# Patient Record
Sex: Male | Born: 1935 | Race: White | Hispanic: No | Smoking: Never smoker
Health system: Southern US, Community
[De-identification: ages and names within clinical notes are randomized; demographics above are authoritative.]

## PROBLEM LIST (undated history)

## (undated) DIAGNOSIS — C801 Malignant (primary) neoplasm, unspecified: Secondary | ICD-10-CM

## (undated) DIAGNOSIS — Z862 Personal history of diseases of the blood and blood-forming organs and certain disorders involving the immune mechanism: Secondary | ICD-10-CM

## (undated) DIAGNOSIS — N189 Chronic kidney disease, unspecified: Secondary | ICD-10-CM

## (undated) DIAGNOSIS — K635 Polyp of colon: Secondary | ICD-10-CM

## (undated) DIAGNOSIS — I10 Essential (primary) hypertension: Secondary | ICD-10-CM

## (undated) DIAGNOSIS — I4891 Unspecified atrial fibrillation: Secondary | ICD-10-CM

## (undated) HISTORY — DX: Personal history of diseases of the blood and blood-forming organs and certain disorders involving the immune mechanism: Z86.2

## (undated) HISTORY — DX: Chronic kidney disease, unspecified: N18.9

## (undated) HISTORY — DX: Polyp of colon: K63.5

## (undated) HISTORY — PX: SKIN CANCER EXCISION: SHX779

## (undated) HISTORY — PX: HERNIA REPAIR: SHX51

## (undated) HISTORY — PX: EYE SURGERY: SHX253

## (undated) HISTORY — DX: Malignant (primary) neoplasm, unspecified: C80.1

---

## 2001-10-05 ENCOUNTER — Encounter: Admission: RE | Admit: 2001-10-05 | Discharge: 2001-10-05 | Payer: Self-pay | Admitting: Internal Medicine

## 2001-10-05 ENCOUNTER — Encounter: Payer: Self-pay | Admitting: Internal Medicine

## 2003-06-06 ENCOUNTER — Ambulatory Visit (HOSPITAL_COMMUNITY): Admission: RE | Admit: 2003-06-06 | Discharge: 2003-06-06 | Payer: Self-pay | Admitting: Internal Medicine

## 2003-10-18 ENCOUNTER — Inpatient Hospital Stay (HOSPITAL_COMMUNITY): Admission: AD | Admit: 2003-10-18 | Discharge: 2003-10-23 | Payer: Self-pay | Admitting: Internal Medicine

## 2003-10-18 ENCOUNTER — Encounter: Admission: RE | Admit: 2003-10-18 | Discharge: 2003-10-18 | Payer: Self-pay | Admitting: Internal Medicine

## 2005-06-28 ENCOUNTER — Observation Stay (HOSPITAL_COMMUNITY): Admission: EM | Admit: 2005-06-28 | Discharge: 2005-06-29 | Payer: Self-pay | Admitting: Emergency Medicine

## 2005-07-03 ENCOUNTER — Inpatient Hospital Stay (HOSPITAL_COMMUNITY): Admission: EM | Admit: 2005-07-03 | Discharge: 2005-07-08 | Payer: Self-pay | Admitting: Emergency Medicine

## 2011-05-23 ENCOUNTER — Encounter (INDEPENDENT_AMBULATORY_CARE_PROVIDER_SITE_OTHER): Payer: Self-pay | Admitting: Surgery

## 2011-08-23 ENCOUNTER — Encounter (INDEPENDENT_AMBULATORY_CARE_PROVIDER_SITE_OTHER): Payer: Self-pay | Admitting: Surgery

## 2011-09-10 DIAGNOSIS — N401 Enlarged prostate with lower urinary tract symptoms: Secondary | ICD-10-CM | POA: Insufficient documentation

## 2011-09-10 DIAGNOSIS — R972 Elevated prostate specific antigen [PSA]: Secondary | ICD-10-CM | POA: Insufficient documentation

## 2011-09-19 DIAGNOSIS — R310 Gross hematuria: Secondary | ICD-10-CM | POA: Insufficient documentation

## 2011-09-30 DIAGNOSIS — C61 Malignant neoplasm of prostate: Secondary | ICD-10-CM | POA: Insufficient documentation

## 2011-12-27 DIAGNOSIS — Z7901 Long term (current) use of anticoagulants: Secondary | ICD-10-CM | POA: Diagnosis not present

## 2012-01-15 DIAGNOSIS — Z7901 Long term (current) use of anticoagulants: Secondary | ICD-10-CM | POA: Diagnosis not present

## 2012-01-30 DIAGNOSIS — Z87448 Personal history of other diseases of urinary system: Secondary | ICD-10-CM | POA: Diagnosis not present

## 2012-01-30 DIAGNOSIS — C61 Malignant neoplasm of prostate: Secondary | ICD-10-CM | POA: Diagnosis not present

## 2012-01-30 DIAGNOSIS — N390 Urinary tract infection, site not specified: Secondary | ICD-10-CM | POA: Diagnosis not present

## 2012-02-06 DIAGNOSIS — Z7901 Long term (current) use of anticoagulants: Secondary | ICD-10-CM | POA: Diagnosis not present

## 2012-02-06 DIAGNOSIS — D682 Hereditary deficiency of other clotting factors: Secondary | ICD-10-CM | POA: Diagnosis not present

## 2012-02-06 DIAGNOSIS — E119 Type 2 diabetes mellitus without complications: Secondary | ICD-10-CM | POA: Diagnosis not present

## 2012-02-06 DIAGNOSIS — C61 Malignant neoplasm of prostate: Secondary | ICD-10-CM | POA: Diagnosis not present

## 2012-02-06 DIAGNOSIS — I10 Essential (primary) hypertension: Secondary | ICD-10-CM | POA: Diagnosis not present

## 2012-02-06 DIAGNOSIS — Z79899 Other long term (current) drug therapy: Secondary | ICD-10-CM | POA: Diagnosis not present

## 2012-02-12 DIAGNOSIS — M109 Gout, unspecified: Secondary | ICD-10-CM | POA: Diagnosis not present

## 2012-02-12 DIAGNOSIS — E1129 Type 2 diabetes mellitus with other diabetic kidney complication: Secondary | ICD-10-CM | POA: Diagnosis not present

## 2012-02-12 DIAGNOSIS — E1149 Type 2 diabetes mellitus with other diabetic neurological complication: Secondary | ICD-10-CM | POA: Diagnosis not present

## 2012-02-12 DIAGNOSIS — E1142 Type 2 diabetes mellitus with diabetic polyneuropathy: Secondary | ICD-10-CM | POA: Diagnosis not present

## 2012-02-12 DIAGNOSIS — Z7901 Long term (current) use of anticoagulants: Secondary | ICD-10-CM | POA: Diagnosis not present

## 2012-02-12 DIAGNOSIS — I10 Essential (primary) hypertension: Secondary | ICD-10-CM | POA: Diagnosis not present

## 2012-02-12 DIAGNOSIS — N183 Chronic kidney disease, stage 3 unspecified: Secondary | ICD-10-CM | POA: Diagnosis not present

## 2012-02-14 DIAGNOSIS — C61 Malignant neoplasm of prostate: Secondary | ICD-10-CM | POA: Diagnosis not present

## 2012-02-14 DIAGNOSIS — R972 Elevated prostate specific antigen [PSA]: Secondary | ICD-10-CM | POA: Diagnosis not present

## 2012-02-27 DIAGNOSIS — C61 Malignant neoplasm of prostate: Secondary | ICD-10-CM | POA: Diagnosis not present

## 2012-03-11 DIAGNOSIS — Z7901 Long term (current) use of anticoagulants: Secondary | ICD-10-CM | POA: Diagnosis not present

## 2012-04-08 DIAGNOSIS — Z7901 Long term (current) use of anticoagulants: Secondary | ICD-10-CM | POA: Diagnosis not present

## 2012-05-06 DIAGNOSIS — R310 Gross hematuria: Secondary | ICD-10-CM | POA: Diagnosis not present

## 2012-05-06 DIAGNOSIS — N390 Urinary tract infection, site not specified: Secondary | ICD-10-CM | POA: Diagnosis not present

## 2012-05-06 DIAGNOSIS — I129 Hypertensive chronic kidney disease with stage 1 through stage 4 chronic kidney disease, or unspecified chronic kidney disease: Secondary | ICD-10-CM | POA: Diagnosis not present

## 2012-05-06 DIAGNOSIS — N401 Enlarged prostate with lower urinary tract symptoms: Secondary | ICD-10-CM | POA: Diagnosis not present

## 2012-05-06 DIAGNOSIS — C61 Malignant neoplasm of prostate: Secondary | ICD-10-CM | POA: Diagnosis not present

## 2012-05-06 DIAGNOSIS — D682 Hereditary deficiency of other clotting factors: Secondary | ICD-10-CM | POA: Diagnosis not present

## 2012-05-06 DIAGNOSIS — R339 Retention of urine, unspecified: Secondary | ICD-10-CM | POA: Diagnosis not present

## 2012-05-06 DIAGNOSIS — N138 Other obstructive and reflux uropathy: Secondary | ICD-10-CM | POA: Diagnosis not present

## 2012-05-06 DIAGNOSIS — Z923 Personal history of irradiation: Secondary | ICD-10-CM | POA: Diagnosis not present

## 2012-05-06 DIAGNOSIS — N189 Chronic kidney disease, unspecified: Secondary | ICD-10-CM | POA: Diagnosis not present

## 2012-05-06 DIAGNOSIS — Z7901 Long term (current) use of anticoagulants: Secondary | ICD-10-CM | POA: Diagnosis not present

## 2012-05-06 DIAGNOSIS — E119 Type 2 diabetes mellitus without complications: Secondary | ICD-10-CM | POA: Diagnosis not present

## 2012-05-20 DIAGNOSIS — L820 Inflamed seborrheic keratosis: Secondary | ICD-10-CM | POA: Diagnosis not present

## 2012-05-20 DIAGNOSIS — D235 Other benign neoplasm of skin of trunk: Secondary | ICD-10-CM | POA: Diagnosis not present

## 2012-05-20 DIAGNOSIS — Z85820 Personal history of malignant melanoma of skin: Secondary | ICD-10-CM | POA: Diagnosis not present

## 2012-05-20 DIAGNOSIS — D1801 Hemangioma of skin and subcutaneous tissue: Secondary | ICD-10-CM | POA: Diagnosis not present

## 2012-06-03 DIAGNOSIS — N183 Chronic kidney disease, stage 3 unspecified: Secondary | ICD-10-CM | POA: Diagnosis not present

## 2012-06-03 DIAGNOSIS — M109 Gout, unspecified: Secondary | ICD-10-CM | POA: Diagnosis not present

## 2012-06-03 DIAGNOSIS — Z0000 Encounter for general adult medical examination without abnormal findings: Secondary | ICD-10-CM | POA: Diagnosis not present

## 2012-06-03 DIAGNOSIS — E1142 Type 2 diabetes mellitus with diabetic polyneuropathy: Secondary | ICD-10-CM | POA: Diagnosis not present

## 2012-06-03 DIAGNOSIS — Z1331 Encounter for screening for depression: Secondary | ICD-10-CM | POA: Diagnosis not present

## 2012-06-03 DIAGNOSIS — E785 Hyperlipidemia, unspecified: Secondary | ICD-10-CM | POA: Diagnosis not present

## 2012-06-03 DIAGNOSIS — E1149 Type 2 diabetes mellitus with other diabetic neurological complication: Secondary | ICD-10-CM | POA: Diagnosis not present

## 2012-06-03 DIAGNOSIS — E1129 Type 2 diabetes mellitus with other diabetic kidney complication: Secondary | ICD-10-CM | POA: Diagnosis not present

## 2012-07-10 DIAGNOSIS — Z7901 Long term (current) use of anticoagulants: Secondary | ICD-10-CM | POA: Diagnosis not present

## 2012-07-22 DIAGNOSIS — Z7901 Long term (current) use of anticoagulants: Secondary | ICD-10-CM | POA: Diagnosis not present

## 2012-08-25 DIAGNOSIS — Z7901 Long term (current) use of anticoagulants: Secondary | ICD-10-CM | POA: Diagnosis not present

## 2012-08-25 DIAGNOSIS — N183 Chronic kidney disease, stage 3 unspecified: Secondary | ICD-10-CM | POA: Diagnosis not present

## 2012-08-25 DIAGNOSIS — E1142 Type 2 diabetes mellitus with diabetic polyneuropathy: Secondary | ICD-10-CM | POA: Diagnosis not present

## 2012-08-25 DIAGNOSIS — E1129 Type 2 diabetes mellitus with other diabetic kidney complication: Secondary | ICD-10-CM | POA: Diagnosis not present

## 2012-08-25 DIAGNOSIS — E1149 Type 2 diabetes mellitus with other diabetic neurological complication: Secondary | ICD-10-CM | POA: Diagnosis not present

## 2012-08-25 DIAGNOSIS — N400 Enlarged prostate without lower urinary tract symptoms: Secondary | ICD-10-CM | POA: Diagnosis not present

## 2012-08-25 DIAGNOSIS — M109 Gout, unspecified: Secondary | ICD-10-CM | POA: Diagnosis not present

## 2012-08-25 DIAGNOSIS — I10 Essential (primary) hypertension: Secondary | ICD-10-CM | POA: Diagnosis not present

## 2012-09-15 DIAGNOSIS — Z23 Encounter for immunization: Secondary | ICD-10-CM | POA: Diagnosis not present

## 2012-09-22 DIAGNOSIS — Z7901 Long term (current) use of anticoagulants: Secondary | ICD-10-CM | POA: Diagnosis not present

## 2012-10-05 DIAGNOSIS — H43819 Vitreous degeneration, unspecified eye: Secondary | ICD-10-CM | POA: Diagnosis not present

## 2012-10-05 DIAGNOSIS — E119 Type 2 diabetes mellitus without complications: Secondary | ICD-10-CM | POA: Diagnosis not present

## 2012-10-05 DIAGNOSIS — H35379 Puckering of macula, unspecified eye: Secondary | ICD-10-CM | POA: Diagnosis not present

## 2012-10-20 DIAGNOSIS — Z7901 Long term (current) use of anticoagulants: Secondary | ICD-10-CM | POA: Diagnosis not present

## 2012-11-04 DIAGNOSIS — N320 Bladder-neck obstruction: Secondary | ICD-10-CM | POA: Diagnosis not present

## 2012-11-04 DIAGNOSIS — N312 Flaccid neuropathic bladder, not elsewhere classified: Secondary | ICD-10-CM | POA: Insufficient documentation

## 2012-11-04 DIAGNOSIS — N390 Urinary tract infection, site not specified: Secondary | ICD-10-CM | POA: Diagnosis not present

## 2012-11-04 DIAGNOSIS — C61 Malignant neoplasm of prostate: Secondary | ICD-10-CM | POA: Diagnosis not present

## 2012-11-04 DIAGNOSIS — R339 Retention of urine, unspecified: Secondary | ICD-10-CM | POA: Diagnosis not present

## 2012-11-04 DIAGNOSIS — R972 Elevated prostate specific antigen [PSA]: Secondary | ICD-10-CM | POA: Diagnosis not present

## 2012-11-17 DIAGNOSIS — Z7901 Long term (current) use of anticoagulants: Secondary | ICD-10-CM | POA: Diagnosis not present

## 2012-11-18 DIAGNOSIS — Z85820 Personal history of malignant melanoma of skin: Secondary | ICD-10-CM | POA: Diagnosis not present

## 2012-11-18 DIAGNOSIS — L570 Actinic keratosis: Secondary | ICD-10-CM | POA: Diagnosis not present

## 2012-11-18 DIAGNOSIS — D235 Other benign neoplasm of skin of trunk: Secondary | ICD-10-CM | POA: Diagnosis not present

## 2012-11-18 DIAGNOSIS — D1801 Hemangioma of skin and subcutaneous tissue: Secondary | ICD-10-CM | POA: Diagnosis not present

## 2012-12-17 DIAGNOSIS — Z7901 Long term (current) use of anticoagulants: Secondary | ICD-10-CM | POA: Diagnosis not present

## 2012-12-29 DIAGNOSIS — N400 Enlarged prostate without lower urinary tract symptoms: Secondary | ICD-10-CM | POA: Diagnosis not present

## 2012-12-29 DIAGNOSIS — E1149 Type 2 diabetes mellitus with other diabetic neurological complication: Secondary | ICD-10-CM | POA: Diagnosis not present

## 2012-12-29 DIAGNOSIS — M109 Gout, unspecified: Secondary | ICD-10-CM | POA: Diagnosis not present

## 2012-12-29 DIAGNOSIS — E1142 Type 2 diabetes mellitus with diabetic polyneuropathy: Secondary | ICD-10-CM | POA: Diagnosis not present

## 2012-12-29 DIAGNOSIS — E1129 Type 2 diabetes mellitus with other diabetic kidney complication: Secondary | ICD-10-CM | POA: Diagnosis not present

## 2012-12-29 DIAGNOSIS — N183 Chronic kidney disease, stage 3 unspecified: Secondary | ICD-10-CM | POA: Diagnosis not present

## 2012-12-29 DIAGNOSIS — I10 Essential (primary) hypertension: Secondary | ICD-10-CM | POA: Diagnosis not present

## 2013-01-20 DIAGNOSIS — Z7901 Long term (current) use of anticoagulants: Secondary | ICD-10-CM | POA: Diagnosis not present

## 2013-02-16 DIAGNOSIS — Z7901 Long term (current) use of anticoagulants: Secondary | ICD-10-CM | POA: Diagnosis not present

## 2013-03-16 DIAGNOSIS — Z7901 Long term (current) use of anticoagulants: Secondary | ICD-10-CM | POA: Diagnosis not present

## 2013-04-13 DIAGNOSIS — Z7901 Long term (current) use of anticoagulants: Secondary | ICD-10-CM | POA: Diagnosis not present

## 2013-04-28 DIAGNOSIS — E1149 Type 2 diabetes mellitus with other diabetic neurological complication: Secondary | ICD-10-CM | POA: Diagnosis not present

## 2013-04-28 DIAGNOSIS — M109 Gout, unspecified: Secondary | ICD-10-CM | POA: Diagnosis not present

## 2013-04-28 DIAGNOSIS — N400 Enlarged prostate without lower urinary tract symptoms: Secondary | ICD-10-CM | POA: Diagnosis not present

## 2013-04-28 DIAGNOSIS — I10 Essential (primary) hypertension: Secondary | ICD-10-CM | POA: Diagnosis not present

## 2013-04-28 DIAGNOSIS — E1142 Type 2 diabetes mellitus with diabetic polyneuropathy: Secondary | ICD-10-CM | POA: Diagnosis not present

## 2013-04-28 DIAGNOSIS — N183 Chronic kidney disease, stage 3 unspecified: Secondary | ICD-10-CM | POA: Diagnosis not present

## 2013-04-28 DIAGNOSIS — E1129 Type 2 diabetes mellitus with other diabetic kidney complication: Secondary | ICD-10-CM | POA: Diagnosis not present

## 2013-04-28 DIAGNOSIS — E785 Hyperlipidemia, unspecified: Secondary | ICD-10-CM | POA: Diagnosis not present

## 2013-04-30 DIAGNOSIS — N183 Chronic kidney disease, stage 3 unspecified: Secondary | ICD-10-CM | POA: Diagnosis not present

## 2013-05-11 DIAGNOSIS — Z7901 Long term (current) use of anticoagulants: Secondary | ICD-10-CM | POA: Diagnosis not present

## 2013-05-12 DIAGNOSIS — C61 Malignant neoplasm of prostate: Secondary | ICD-10-CM | POA: Diagnosis not present

## 2013-05-12 DIAGNOSIS — N390 Urinary tract infection, site not specified: Secondary | ICD-10-CM | POA: Diagnosis not present

## 2013-05-12 DIAGNOSIS — N312 Flaccid neuropathic bladder, not elsewhere classified: Secondary | ICD-10-CM | POA: Diagnosis not present

## 2013-05-12 DIAGNOSIS — R339 Retention of urine, unspecified: Secondary | ICD-10-CM | POA: Diagnosis not present

## 2013-05-12 DIAGNOSIS — R972 Elevated prostate specific antigen [PSA]: Secondary | ICD-10-CM | POA: Diagnosis not present

## 2013-05-19 DIAGNOSIS — Z85820 Personal history of malignant melanoma of skin: Secondary | ICD-10-CM | POA: Diagnosis not present

## 2013-05-19 DIAGNOSIS — L723 Sebaceous cyst: Secondary | ICD-10-CM | POA: Diagnosis not present

## 2013-05-19 DIAGNOSIS — D235 Other benign neoplasm of skin of trunk: Secondary | ICD-10-CM | POA: Diagnosis not present

## 2013-06-08 DIAGNOSIS — Z7901 Long term (current) use of anticoagulants: Secondary | ICD-10-CM | POA: Diagnosis not present

## 2013-06-14 DIAGNOSIS — L97509 Non-pressure chronic ulcer of other part of unspecified foot with unspecified severity: Secondary | ICD-10-CM | POA: Diagnosis not present

## 2013-06-14 DIAGNOSIS — B351 Tinea unguium: Secondary | ICD-10-CM | POA: Diagnosis not present

## 2013-06-14 DIAGNOSIS — M2040 Other hammer toe(s) (acquired), unspecified foot: Secondary | ICD-10-CM | POA: Diagnosis not present

## 2013-06-14 DIAGNOSIS — M79609 Pain in unspecified limb: Secondary | ICD-10-CM | POA: Diagnosis not present

## 2013-06-28 DIAGNOSIS — L97509 Non-pressure chronic ulcer of other part of unspecified foot with unspecified severity: Secondary | ICD-10-CM | POA: Diagnosis not present

## 2013-07-06 DIAGNOSIS — Z7901 Long term (current) use of anticoagulants: Secondary | ICD-10-CM | POA: Diagnosis not present

## 2013-08-04 DIAGNOSIS — Z7901 Long term (current) use of anticoagulants: Secondary | ICD-10-CM | POA: Diagnosis not present

## 2013-08-31 DIAGNOSIS — E1129 Type 2 diabetes mellitus with other diabetic kidney complication: Secondary | ICD-10-CM | POA: Diagnosis not present

## 2013-08-31 DIAGNOSIS — Z7901 Long term (current) use of anticoagulants: Secondary | ICD-10-CM | POA: Diagnosis not present

## 2013-08-31 DIAGNOSIS — I10 Essential (primary) hypertension: Secondary | ICD-10-CM | POA: Diagnosis not present

## 2013-08-31 DIAGNOSIS — N183 Chronic kidney disease, stage 3 unspecified: Secondary | ICD-10-CM | POA: Diagnosis not present

## 2013-08-31 DIAGNOSIS — E1142 Type 2 diabetes mellitus with diabetic polyneuropathy: Secondary | ICD-10-CM | POA: Diagnosis not present

## 2013-08-31 DIAGNOSIS — E1149 Type 2 diabetes mellitus with other diabetic neurological complication: Secondary | ICD-10-CM | POA: Diagnosis not present

## 2013-08-31 DIAGNOSIS — M109 Gout, unspecified: Secondary | ICD-10-CM | POA: Diagnosis not present

## 2013-09-24 ENCOUNTER — Encounter: Payer: Self-pay | Admitting: *Deleted

## 2013-09-28 DIAGNOSIS — Z7901 Long term (current) use of anticoagulants: Secondary | ICD-10-CM | POA: Diagnosis not present

## 2013-09-29 ENCOUNTER — Ambulatory Visit: Payer: Self-pay | Admitting: Podiatry

## 2013-10-11 DIAGNOSIS — Z23 Encounter for immunization: Secondary | ICD-10-CM | POA: Diagnosis not present

## 2013-10-26 DIAGNOSIS — I2699 Other pulmonary embolism without acute cor pulmonale: Secondary | ICD-10-CM | POA: Diagnosis not present

## 2013-10-26 DIAGNOSIS — Z7901 Long term (current) use of anticoagulants: Secondary | ICD-10-CM | POA: Diagnosis not present

## 2013-11-17 DIAGNOSIS — Z85820 Personal history of malignant melanoma of skin: Secondary | ICD-10-CM | POA: Diagnosis not present

## 2013-11-17 DIAGNOSIS — L820 Inflamed seborrheic keratosis: Secondary | ICD-10-CM | POA: Diagnosis not present

## 2013-11-17 DIAGNOSIS — L570 Actinic keratosis: Secondary | ICD-10-CM | POA: Diagnosis not present

## 2013-11-17 DIAGNOSIS — L821 Other seborrheic keratosis: Secondary | ICD-10-CM | POA: Diagnosis not present

## 2013-11-23 DIAGNOSIS — Z7901 Long term (current) use of anticoagulants: Secondary | ICD-10-CM | POA: Diagnosis not present

## 2013-11-23 DIAGNOSIS — I872 Venous insufficiency (chronic) (peripheral): Secondary | ICD-10-CM | POA: Diagnosis not present

## 2013-12-22 DIAGNOSIS — Z5181 Encounter for therapeutic drug level monitoring: Secondary | ICD-10-CM | POA: Diagnosis not present

## 2013-12-22 DIAGNOSIS — Z7901 Long term (current) use of anticoagulants: Secondary | ICD-10-CM | POA: Diagnosis not present

## 2014-01-04 DIAGNOSIS — E1129 Type 2 diabetes mellitus with other diabetic kidney complication: Secondary | ICD-10-CM | POA: Diagnosis not present

## 2014-01-04 DIAGNOSIS — E1149 Type 2 diabetes mellitus with other diabetic neurological complication: Secondary | ICD-10-CM | POA: Diagnosis not present

## 2014-01-04 DIAGNOSIS — E1142 Type 2 diabetes mellitus with diabetic polyneuropathy: Secondary | ICD-10-CM | POA: Diagnosis not present

## 2014-01-04 DIAGNOSIS — I10 Essential (primary) hypertension: Secondary | ICD-10-CM | POA: Diagnosis not present

## 2014-01-04 DIAGNOSIS — N183 Chronic kidney disease, stage 3 unspecified: Secondary | ICD-10-CM | POA: Diagnosis not present

## 2014-01-05 DIAGNOSIS — C61 Malignant neoplasm of prostate: Secondary | ICD-10-CM | POA: Diagnosis not present

## 2014-01-05 DIAGNOSIS — R972 Elevated prostate specific antigen [PSA]: Secondary | ICD-10-CM | POA: Diagnosis not present

## 2014-01-05 DIAGNOSIS — R339 Retention of urine, unspecified: Secondary | ICD-10-CM | POA: Diagnosis not present

## 2014-01-05 DIAGNOSIS — N390 Urinary tract infection, site not specified: Secondary | ICD-10-CM | POA: Diagnosis not present

## 2014-01-19 DIAGNOSIS — Z5181 Encounter for therapeutic drug level monitoring: Secondary | ICD-10-CM | POA: Diagnosis not present

## 2014-01-19 DIAGNOSIS — Z7901 Long term (current) use of anticoagulants: Secondary | ICD-10-CM | POA: Diagnosis not present

## 2014-02-16 DIAGNOSIS — I872 Venous insufficiency (chronic) (peripheral): Secondary | ICD-10-CM | POA: Diagnosis not present

## 2014-02-16 DIAGNOSIS — Z7901 Long term (current) use of anticoagulants: Secondary | ICD-10-CM | POA: Diagnosis not present

## 2014-03-15 DIAGNOSIS — Z7901 Long term (current) use of anticoagulants: Secondary | ICD-10-CM | POA: Diagnosis not present

## 2014-03-15 DIAGNOSIS — I2699 Other pulmonary embolism without acute cor pulmonale: Secondary | ICD-10-CM | POA: Diagnosis not present

## 2014-03-23 DIAGNOSIS — B356 Tinea cruris: Secondary | ICD-10-CM | POA: Diagnosis not present

## 2014-03-23 DIAGNOSIS — J069 Acute upper respiratory infection, unspecified: Secondary | ICD-10-CM | POA: Diagnosis not present

## 2014-03-23 DIAGNOSIS — B372 Candidiasis of skin and nail: Secondary | ICD-10-CM | POA: Diagnosis not present

## 2014-03-23 DIAGNOSIS — L089 Local infection of the skin and subcutaneous tissue, unspecified: Secondary | ICD-10-CM | POA: Diagnosis not present

## 2014-04-13 DIAGNOSIS — Z7901 Long term (current) use of anticoagulants: Secondary | ICD-10-CM | POA: Diagnosis not present

## 2014-04-13 DIAGNOSIS — Z5181 Encounter for therapeutic drug level monitoring: Secondary | ICD-10-CM | POA: Diagnosis not present

## 2014-05-11 DIAGNOSIS — Z7901 Long term (current) use of anticoagulants: Secondary | ICD-10-CM | POA: Diagnosis not present

## 2014-05-11 DIAGNOSIS — I2699 Other pulmonary embolism without acute cor pulmonale: Secondary | ICD-10-CM | POA: Diagnosis not present

## 2014-05-18 DIAGNOSIS — D1801 Hemangioma of skin and subcutaneous tissue: Secondary | ICD-10-CM | POA: Diagnosis not present

## 2014-05-18 DIAGNOSIS — Z85820 Personal history of malignant melanoma of skin: Secondary | ICD-10-CM | POA: Diagnosis not present

## 2014-05-18 DIAGNOSIS — D485 Neoplasm of uncertain behavior of skin: Secondary | ICD-10-CM | POA: Diagnosis not present

## 2014-05-18 DIAGNOSIS — D235 Other benign neoplasm of skin of trunk: Secondary | ICD-10-CM | POA: Diagnosis not present

## 2014-05-24 DIAGNOSIS — Z1331 Encounter for screening for depression: Secondary | ICD-10-CM | POA: Diagnosis not present

## 2014-05-24 DIAGNOSIS — Z0000 Encounter for general adult medical examination without abnormal findings: Secondary | ICD-10-CM | POA: Diagnosis not present

## 2014-05-24 DIAGNOSIS — N183 Chronic kidney disease, stage 3 unspecified: Secondary | ICD-10-CM | POA: Diagnosis not present

## 2014-05-24 DIAGNOSIS — E1142 Type 2 diabetes mellitus with diabetic polyneuropathy: Secondary | ICD-10-CM | POA: Diagnosis not present

## 2014-05-24 DIAGNOSIS — I739 Peripheral vascular disease, unspecified: Secondary | ICD-10-CM | POA: Diagnosis not present

## 2014-05-24 DIAGNOSIS — E1129 Type 2 diabetes mellitus with other diabetic kidney complication: Secondary | ICD-10-CM | POA: Diagnosis not present

## 2014-05-24 DIAGNOSIS — Z23 Encounter for immunization: Secondary | ICD-10-CM | POA: Diagnosis not present

## 2014-05-24 DIAGNOSIS — E1149 Type 2 diabetes mellitus with other diabetic neurological complication: Secondary | ICD-10-CM | POA: Diagnosis not present

## 2014-05-24 DIAGNOSIS — E1159 Type 2 diabetes mellitus with other circulatory complications: Secondary | ICD-10-CM | POA: Diagnosis not present

## 2014-05-24 DIAGNOSIS — I10 Essential (primary) hypertension: Secondary | ICD-10-CM | POA: Diagnosis not present

## 2014-06-08 DIAGNOSIS — Z7901 Long term (current) use of anticoagulants: Secondary | ICD-10-CM | POA: Diagnosis not present

## 2014-06-08 DIAGNOSIS — I2699 Other pulmonary embolism without acute cor pulmonale: Secondary | ICD-10-CM | POA: Diagnosis not present

## 2014-07-06 DIAGNOSIS — N138 Other obstructive and reflux uropathy: Secondary | ICD-10-CM | POA: Diagnosis not present

## 2014-07-06 DIAGNOSIS — Z87891 Personal history of nicotine dependence: Secondary | ICD-10-CM | POA: Diagnosis not present

## 2014-07-06 DIAGNOSIS — N189 Chronic kidney disease, unspecified: Secondary | ICD-10-CM | POA: Diagnosis not present

## 2014-07-06 DIAGNOSIS — I129 Hypertensive chronic kidney disease with stage 1 through stage 4 chronic kidney disease, or unspecified chronic kidney disease: Secondary | ICD-10-CM | POA: Diagnosis not present

## 2014-07-06 DIAGNOSIS — N401 Enlarged prostate with lower urinary tract symptoms: Secondary | ICD-10-CM | POA: Diagnosis not present

## 2014-07-06 DIAGNOSIS — C61 Malignant neoplasm of prostate: Secondary | ICD-10-CM | POA: Diagnosis not present

## 2014-07-06 DIAGNOSIS — R972 Elevated prostate specific antigen [PSA]: Secondary | ICD-10-CM | POA: Diagnosis not present

## 2014-07-06 DIAGNOSIS — Z5181 Encounter for therapeutic drug level monitoring: Secondary | ICD-10-CM | POA: Diagnosis not present

## 2014-07-06 DIAGNOSIS — R310 Gross hematuria: Secondary | ICD-10-CM | POA: Diagnosis not present

## 2014-07-06 DIAGNOSIS — R339 Retention of urine, unspecified: Secondary | ICD-10-CM | POA: Diagnosis not present

## 2014-07-06 DIAGNOSIS — N312 Flaccid neuropathic bladder, not elsewhere classified: Secondary | ICD-10-CM | POA: Diagnosis not present

## 2014-07-06 DIAGNOSIS — Z7901 Long term (current) use of anticoagulants: Secondary | ICD-10-CM | POA: Diagnosis not present

## 2014-07-21 DIAGNOSIS — C61 Malignant neoplasm of prostate: Secondary | ICD-10-CM | POA: Diagnosis not present

## 2014-07-21 DIAGNOSIS — R972 Elevated prostate specific antigen [PSA]: Secondary | ICD-10-CM | POA: Diagnosis not present

## 2014-08-03 DIAGNOSIS — I872 Venous insufficiency (chronic) (peripheral): Secondary | ICD-10-CM | POA: Diagnosis not present

## 2014-08-03 DIAGNOSIS — Z7901 Long term (current) use of anticoagulants: Secondary | ICD-10-CM | POA: Diagnosis not present

## 2014-08-31 DIAGNOSIS — Z7901 Long term (current) use of anticoagulants: Secondary | ICD-10-CM | POA: Diagnosis not present

## 2014-08-31 DIAGNOSIS — I872 Venous insufficiency (chronic) (peripheral): Secondary | ICD-10-CM | POA: Diagnosis not present

## 2014-09-28 DIAGNOSIS — N183 Chronic kidney disease, stage 3 (moderate): Secondary | ICD-10-CM | POA: Diagnosis not present

## 2014-09-28 DIAGNOSIS — E1142 Type 2 diabetes mellitus with diabetic polyneuropathy: Secondary | ICD-10-CM | POA: Diagnosis not present

## 2014-09-28 DIAGNOSIS — Z23 Encounter for immunization: Secondary | ICD-10-CM | POA: Diagnosis not present

## 2014-09-28 DIAGNOSIS — N400 Enlarged prostate without lower urinary tract symptoms: Secondary | ICD-10-CM | POA: Diagnosis not present

## 2014-09-28 DIAGNOSIS — E1122 Type 2 diabetes mellitus with diabetic chronic kidney disease: Secondary | ICD-10-CM | POA: Diagnosis not present

## 2014-09-28 DIAGNOSIS — M109 Gout, unspecified: Secondary | ICD-10-CM | POA: Diagnosis not present

## 2014-09-28 DIAGNOSIS — I739 Peripheral vascular disease, unspecified: Secondary | ICD-10-CM | POA: Diagnosis not present

## 2014-09-28 DIAGNOSIS — I129 Hypertensive chronic kidney disease with stage 1 through stage 4 chronic kidney disease, or unspecified chronic kidney disease: Secondary | ICD-10-CM | POA: Diagnosis not present

## 2014-09-28 DIAGNOSIS — E1151 Type 2 diabetes mellitus with diabetic peripheral angiopathy without gangrene: Secondary | ICD-10-CM | POA: Diagnosis not present

## 2014-09-28 DIAGNOSIS — Z7901 Long term (current) use of anticoagulants: Secondary | ICD-10-CM | POA: Diagnosis not present

## 2014-10-13 ENCOUNTER — Encounter: Payer: Self-pay | Admitting: Podiatry

## 2014-10-13 ENCOUNTER — Ambulatory Visit (INDEPENDENT_AMBULATORY_CARE_PROVIDER_SITE_OTHER): Payer: Medicare Other | Admitting: Podiatry

## 2014-10-13 VITALS — BP 150/86 | HR 66 | Resp 17

## 2014-10-13 DIAGNOSIS — M79676 Pain in unspecified toe(s): Secondary | ICD-10-CM | POA: Diagnosis not present

## 2014-10-13 DIAGNOSIS — E1149 Type 2 diabetes mellitus with other diabetic neurological complication: Secondary | ICD-10-CM

## 2014-10-13 DIAGNOSIS — E1140 Type 2 diabetes mellitus with diabetic neuropathy, unspecified: Secondary | ICD-10-CM | POA: Diagnosis not present

## 2014-10-13 DIAGNOSIS — B351 Tinea unguium: Secondary | ICD-10-CM | POA: Diagnosis not present

## 2014-10-13 NOTE — Progress Notes (Signed)
   Subjective:    Patient ID: Richard [name], male    DOB: [birth year], 80 y.o.   MRN: [id number]  HPI Mr. Richard [name], 80 year old male, presents the office if her diabetic risk assessment and for painful elongated nails. He is also asking to see if he is a candidate for diabetic shoes. He is a history of ulcerations of the right third digit. He denies any claudication symptoms. No other complaints at this time.  Review of Systems  All other systems reviewed and are negative.      Objective:   Physical Exam AAO x3, NAD DP/PT pulses palpable bilaterally, CRT less than 3 seconds Protective sensation decreased with Simms Weinstein monofilament, vibratory sensation decreased , Achilles tendon reflex intact Nails hypertrophic, dystrophic, elongated, brittle, discolored 10 without any surrounding erythema or drainage. No open lesions MMT 5/5, ROM WNL. Hammertoe contractures bilaterally. Decreased medial arch height bilaterally. Mild equinus bilaterally. No calf pain with compression, swelling, warmth, erythema.        Assessment & Plan:  80 year old male with symptomatic onychomycosis -Treatment options discussed including alternatives, risks, complications. -Nail sharply debrided 10 without complications. -Discussed the importance of daily foot inspection. -Recommend diabetic shoes due to the history of ulceration with deformity and decreased sensation. The patient was given a prescription to [facility] to have diabetic inserts/shoes made. -Follow-up in 3 months or sooner if any problems are to arise. In the meantime call the office with a questions, concerns, change in symptoms.

## 2014-10-13 NOTE — Patient Instructions (Signed)
Diabetes and Foot Care Diabetes may cause you to have problems because of poor blood supply (circulation) to your feet and legs. This may cause the skin on your feet to become thinner, break easier, and heal more slowly. Your skin may become dry, and the skin may peel and crack. You may also have nerve damage in your legs and feet causing decreased feeling in them. You may not notice minor injuries to your feet that could lead to infections or more serious problems. Taking care of your feet is one of the most important things you can do for yourself.  HOME CARE INSTRUCTIONS  Wear shoes at all times, even in the house. Do not go barefoot. Bare feet are easily injured.  Check your feet daily for blisters, cuts, and redness. If you cannot see the bottom of your feet, use a mirror or ask someone for help.  Wash your feet with warm water (do not use hot water) and mild soap. Then pat your feet and the areas between your toes until they are completely dry. Do not soak your feet as this can dry your skin.  Apply a moisturizing lotion or petroleum jelly (that does not contain alcohol and is unscented) to the skin on your feet and to dry, brittle toenails. Do not apply lotion between your toes.  Trim your toenails straight across. Do not dig under them or around the cuticle. File the edges of your nails with an emery board or nail file.  Do not cut corns or calluses or try to remove them with medicine.  Wear clean socks or stockings every day. Make sure they are not too tight. Do not wear knee-high stockings since they may decrease blood flow to your legs.  Wear shoes that fit properly and have enough cushioning. To break in new shoes, wear them for just a few hours a day. This prevents you from injuring your feet. Always look in your shoes before you put them on to be sure there are no objects inside.  Do not cross your legs. This may decrease the blood flow to your feet.  If you find a minor scrape,  cut, or break in the skin on your feet, keep it and the skin around it clean and dry. These areas may be cleansed with mild soap and water. Do not cleanse the area with peroxide, alcohol, or iodine.  When you remove an adhesive bandage, be sure not to damage the skin around it.  If you have a wound, look at it several times a day to make sure it is healing.  Do not use heating pads or hot water bottles. They may burn your skin. If you have lost feeling in your feet or legs, you may not know it is happening until it is too late.  Make sure your health care provider performs a complete foot exam at least annually or more often if you have foot problems. Report any cuts, sores, or bruises to your health care provider immediately. SEEK MEDICAL CARE IF:   You have an injury that is not healing.  You have cuts or breaks in the skin.  You have an ingrown nail.  You notice redness on your legs or feet.  You feel burning or tingling in your legs or feet.  You have pain or cramps in your legs and feet.  Your legs or feet are numb.  Your feet always feel cold. SEEK IMMEDIATE MEDICAL CARE IF:   There is increasing redness,   swelling, or pain in or around a wound.  There is a red line that goes up your leg.  Pus is coming from a wound.  You develop a fever or as directed by your health care provider.  You notice a bad smell coming from an ulcer or wound. Document Released: 11/29/2000 Document Revised: 08/04/2013 Document Reviewed: 05/11/2013 Exit[employer] Patient Information 2015 [employer]. This information is not intended to replace advice given to you by your health care provider. Make sure you discuss any questions you have with your health care provider.  

## 2014-10-19 ENCOUNTER — Ambulatory Visit: Payer: Self-pay | Admitting: Podiatry

## 2014-10-26 DIAGNOSIS — Z7901 Long term (current) use of anticoagulants: Secondary | ICD-10-CM | POA: Diagnosis not present

## 2014-10-26 DIAGNOSIS — Z86711 Personal history of pulmonary embolism: Secondary | ICD-10-CM | POA: Diagnosis not present

## 2014-11-16 DIAGNOSIS — Z08 Encounter for follow-up examination after completed treatment for malignant neoplasm: Secondary | ICD-10-CM | POA: Diagnosis not present

## 2014-11-16 DIAGNOSIS — Z85820 Personal history of malignant melanoma of skin: Secondary | ICD-10-CM | POA: Diagnosis not present

## 2014-11-16 DIAGNOSIS — D1801 Hemangioma of skin and subcutaneous tissue: Secondary | ICD-10-CM | POA: Diagnosis not present

## 2014-11-16 DIAGNOSIS — D225 Melanocytic nevi of trunk: Secondary | ICD-10-CM | POA: Diagnosis not present

## 2014-11-23 DIAGNOSIS — Z7901 Long term (current) use of anticoagulants: Secondary | ICD-10-CM | POA: Diagnosis not present

## 2014-12-21 DIAGNOSIS — Z7901 Long term (current) use of anticoagulants: Secondary | ICD-10-CM | POA: Diagnosis not present

## 2015-01-10 ENCOUNTER — Ambulatory Visit (INDEPENDENT_AMBULATORY_CARE_PROVIDER_SITE_OTHER): Payer: Medicare Other | Admitting: Podiatry

## 2015-01-10 ENCOUNTER — Encounter: Payer: Self-pay | Admitting: Podiatry

## 2015-01-10 DIAGNOSIS — B351 Tinea unguium: Secondary | ICD-10-CM

## 2015-01-10 DIAGNOSIS — G629 Polyneuropathy, unspecified: Secondary | ICD-10-CM

## 2015-01-10 DIAGNOSIS — M79676 Pain in unspecified toe(s): Secondary | ICD-10-CM

## 2015-01-10 NOTE — Progress Notes (Signed)
Patient ID: TYHIR RODDA, male   DOB: 19-Dec-1930, 81 y.o.   MRN: [id number]  Subjective: 81 year old male returns the office they for follow-up evaluation and for continued footcare. He states that his nails elongated and thick. He states that he is unable to trim the nails himself to the thickness and he is also on Coumadin. He states the nails rub in the shoes causes him discomfort. No other complaints at this time in no acute changes and facet pointed. Denies any systemic complaints as fevers, chills, nausea, vomiting.  Objective: AAO 3, NAD DP/PT pulses palpable bilaterally, CRT less than 3 seconds. Varicose veins to bilateral lower extremities. There is trace chronic appearing edema to bilateral lower extremities.  Decreased protective sensation with Derrel Nip monofilament, decreased vibratory sensation, Achilles tendon reflex intact. Nails hypertrophic, dystrophic, elongated, brittle, discolored 10. No surrounding erythema or drainage from the nail sites. No open lesions or pre-ulcerative lesions identified.  No other areas of tenderness to bilateral lower extremities. No overlying erythema or increase in warmth to bilateral lower extremity. No pain with calf compression, swelling, warmth, erythema.  Assessment: 81 year old male with symptomatic onychomycosis; neuropathy   Plan: -Treatment options discussed with the patient include alternatives, risks, competitions.  -Nail sharply debrided 10 without complication/bleeding.  -Discussed the importance of inspection.  -Follow-up in 3 months or sooner should any problems arise. In the meantime, encouraged to call the office with any questions, concerns, change in symptoms.

## 2015-01-11 DIAGNOSIS — C61 Malignant neoplasm of prostate: Secondary | ICD-10-CM | POA: Diagnosis not present

## 2015-01-11 DIAGNOSIS — R319 Hematuria, unspecified: Secondary | ICD-10-CM | POA: Diagnosis not present

## 2015-01-11 DIAGNOSIS — N390 Urinary tract infection, site not specified: Secondary | ICD-10-CM | POA: Insufficient documentation

## 2015-01-11 DIAGNOSIS — R339 Retention of urine, unspecified: Secondary | ICD-10-CM | POA: Diagnosis not present

## 2015-01-11 DIAGNOSIS — N312 Flaccid neuropathic bladder, not elsewhere classified: Secondary | ICD-10-CM | POA: Diagnosis not present

## 2015-01-11 DIAGNOSIS — R972 Elevated prostate specific antigen [PSA]: Secondary | ICD-10-CM | POA: Diagnosis not present

## 2015-01-12 ENCOUNTER — Ambulatory Visit: Payer: Medicare Other | Admitting: Podiatry

## 2015-01-18 DIAGNOSIS — Z7901 Long term (current) use of anticoagulants: Secondary | ICD-10-CM | POA: Diagnosis not present

## 2015-01-31 DIAGNOSIS — E119 Type 2 diabetes mellitus without complications: Secondary | ICD-10-CM | POA: Diagnosis not present

## 2015-01-31 DIAGNOSIS — H04123 Dry eye syndrome of bilateral lacrimal glands: Secondary | ICD-10-CM | POA: Diagnosis not present

## 2015-02-01 DIAGNOSIS — N400 Enlarged prostate without lower urinary tract symptoms: Secondary | ICD-10-CM | POA: Diagnosis not present

## 2015-02-01 DIAGNOSIS — E1122 Type 2 diabetes mellitus with diabetic chronic kidney disease: Secondary | ICD-10-CM | POA: Diagnosis not present

## 2015-02-01 DIAGNOSIS — E1142 Type 2 diabetes mellitus with diabetic polyneuropathy: Secondary | ICD-10-CM | POA: Diagnosis not present

## 2015-02-01 DIAGNOSIS — N183 Chronic kidney disease, stage 3 (moderate): Secondary | ICD-10-CM | POA: Diagnosis not present

## 2015-02-01 DIAGNOSIS — N529 Male erectile dysfunction, unspecified: Secondary | ICD-10-CM | POA: Diagnosis not present

## 2015-02-01 DIAGNOSIS — I129 Hypertensive chronic kidney disease with stage 1 through stage 4 chronic kidney disease, or unspecified chronic kidney disease: Secondary | ICD-10-CM | POA: Diagnosis not present

## 2015-02-01 DIAGNOSIS — E1151 Type 2 diabetes mellitus with diabetic peripheral angiopathy without gangrene: Secondary | ICD-10-CM | POA: Diagnosis not present

## 2015-02-15 DIAGNOSIS — Z7901 Long term (current) use of anticoagulants: Secondary | ICD-10-CM | POA: Diagnosis not present

## 2015-03-15 DIAGNOSIS — Z7901 Long term (current) use of anticoagulants: Secondary | ICD-10-CM | POA: Diagnosis not present

## 2015-04-12 DIAGNOSIS — Z7901 Long term (current) use of anticoagulants: Secondary | ICD-10-CM | POA: Diagnosis not present

## 2015-04-13 ENCOUNTER — Ambulatory Visit (INDEPENDENT_AMBULATORY_CARE_PROVIDER_SITE_OTHER): Payer: Medicare Other | Admitting: Podiatry

## 2015-04-13 DIAGNOSIS — B351 Tinea unguium: Secondary | ICD-10-CM | POA: Diagnosis not present

## 2015-04-13 DIAGNOSIS — M79676 Pain in unspecified toe(s): Secondary | ICD-10-CM

## 2015-04-13 NOTE — Progress Notes (Signed)
Patient ID: LAKHI DILS, male   DOB: Feb 07, 1931, 81 y.o.   MRN: [id number]  Subjective: 81 y.o.-year-old male returns the office today for painful, elongated, thickened toenails which he is unable to do himself. Denies any redness or drainage around the nails. Denies any acute changes since last appointment and no new complaints today. Denies any systemic complaints such as fevers, chills, nausea, vomiting.   Objective: AAO 3, NAD DP/PT pulses palpable, CRT less than 3 seconds Protective sensation decreased with Simms Weinstein monofilament, Achilles tendon reflex intact.  Nails hypertrophic, dystrophic, elongated, brittle, discolored 10. There is tenderness overlying these nails. There is no surrounding erythema or drainage along the nail sites. No open lesions or pre-ulcerative lesions are identified. No other areas of tenderness bilateral lower extremities. No overlying edema, erythema, increased warmth. No pain with calf compression, swelling, warmth, erythema.  Assessment: Patient presents with symptomatic onychomycosis  Plan: -Treatment options including alternatives, risks, complications were discussed -Nails sharply debrided 10 without complication/bleeding. -Discussed daily foot inspection. If there are any changes, to call the office immediately.  -Follow-up in 3 months or sooner if any problems are to arise. In the meantime, encouraged to call the office with any questions, concerns, changes symptoms.

## 2015-05-10 DIAGNOSIS — Z7901 Long term (current) use of anticoagulants: Secondary | ICD-10-CM | POA: Diagnosis not present

## 2015-06-06 DIAGNOSIS — M5442 Lumbago with sciatica, left side: Secondary | ICD-10-CM | POA: Diagnosis not present

## 2015-06-06 DIAGNOSIS — M25552 Pain in left hip: Secondary | ICD-10-CM | POA: Diagnosis not present

## 2015-06-06 DIAGNOSIS — M545 Low back pain: Secondary | ICD-10-CM | POA: Diagnosis not present

## 2015-06-07 DIAGNOSIS — N400 Enlarged prostate without lower urinary tract symptoms: Secondary | ICD-10-CM | POA: Diagnosis not present

## 2015-06-07 DIAGNOSIS — Z7901 Long term (current) use of anticoagulants: Secondary | ICD-10-CM | POA: Diagnosis not present

## 2015-06-07 DIAGNOSIS — E1142 Type 2 diabetes mellitus with diabetic polyneuropathy: Secondary | ICD-10-CM | POA: Diagnosis not present

## 2015-06-07 DIAGNOSIS — E782 Mixed hyperlipidemia: Secondary | ICD-10-CM | POA: Diagnosis not present

## 2015-06-07 DIAGNOSIS — E1151 Type 2 diabetes mellitus with diabetic peripheral angiopathy without gangrene: Secondary | ICD-10-CM | POA: Diagnosis not present

## 2015-06-07 DIAGNOSIS — M109 Gout, unspecified: Secondary | ICD-10-CM | POA: Diagnosis not present

## 2015-06-07 DIAGNOSIS — E1122 Type 2 diabetes mellitus with diabetic chronic kidney disease: Secondary | ICD-10-CM | POA: Diagnosis not present

## 2015-06-07 DIAGNOSIS — N183 Chronic kidney disease, stage 3 (moderate): Secondary | ICD-10-CM | POA: Diagnosis not present

## 2015-06-07 DIAGNOSIS — C61 Malignant neoplasm of prostate: Secondary | ICD-10-CM | POA: Diagnosis not present

## 2015-06-07 DIAGNOSIS — I129 Hypertensive chronic kidney disease with stage 1 through stage 4 chronic kidney disease, or unspecified chronic kidney disease: Secondary | ICD-10-CM | POA: Diagnosis not present

## 2015-06-07 DIAGNOSIS — I739 Peripheral vascular disease, unspecified: Secondary | ICD-10-CM | POA: Diagnosis not present

## 2015-06-07 DIAGNOSIS — Z1389 Encounter for screening for other disorder: Secondary | ICD-10-CM | POA: Diagnosis not present

## 2015-06-09 DIAGNOSIS — L570 Actinic keratosis: Secondary | ICD-10-CM | POA: Diagnosis not present

## 2015-06-09 DIAGNOSIS — L820 Inflamed seborrheic keratosis: Secondary | ICD-10-CM | POA: Diagnosis not present

## 2015-06-09 DIAGNOSIS — D225 Melanocytic nevi of trunk: Secondary | ICD-10-CM | POA: Diagnosis not present

## 2015-06-09 DIAGNOSIS — Z85820 Personal history of malignant melanoma of skin: Secondary | ICD-10-CM | POA: Diagnosis not present

## 2015-06-09 DIAGNOSIS — D1801 Hemangioma of skin and subcutaneous tissue: Secondary | ICD-10-CM | POA: Diagnosis not present

## 2015-06-09 DIAGNOSIS — Z08 Encounter for follow-up examination after completed treatment for malignant neoplasm: Secondary | ICD-10-CM | POA: Diagnosis not present

## 2015-07-05 DIAGNOSIS — Z7901 Long term (current) use of anticoagulants: Secondary | ICD-10-CM | POA: Diagnosis not present

## 2015-07-13 ENCOUNTER — Ambulatory Visit (INDEPENDENT_AMBULATORY_CARE_PROVIDER_SITE_OTHER): Payer: Medicare Other | Admitting: Podiatry

## 2015-07-13 DIAGNOSIS — B351 Tinea unguium: Secondary | ICD-10-CM | POA: Diagnosis not present

## 2015-07-13 DIAGNOSIS — M79676 Pain in unspecified toe(s): Secondary | ICD-10-CM

## 2015-07-13 NOTE — Progress Notes (Signed)
Patient ID: Richard [name], male   DOB: 02/24/1931, 81 y.o.   MRN: [id number]  Subjective: 81 y.o.-year-old male returns the office today for painful, elongated, thickened toenails which he is unable to do himself. Denies any redness or drainage around the nails. Denies any acute changes since last appointment and no new complaints today. Denies any systemic complaints such as fevers, chills, nausea, vomiting.   Objective: AAO 3, NAD DP/PT pulses palpable, CRT less than 3 seconds Protective sensation decreased with Simms Weinstein monofilament, Achilles tendon reflex intact.  Nails hypertrophic, dystrophic, elongated, brittle, discolored 10. There is tenderness overlying these nails. There is no surrounding erythema or drainage along the nail sites. No open lesions or pre-ulcerative lesions are identified. No other areas of tenderness bilateral lower extremities. No overlying edema, erythema, increased warmth. No pain with calf compression, swelling, warmth, erythema.  Assessment: Patient presents with symptomatic onychomycosis  Plan: -Treatment options including alternatives, risks, complications were discussed -Nails sharply debrided 10 without complication/bleeding. -Discussed daily foot inspection. If there are any changes, to call the office immediately.  -Follow-up in 3 months or sooner if any problems are to arise. In the meantime, encouraged to call the office with any questions, concerns, changes symptoms.  Celesta Gentile, DPM

## 2015-08-02 DIAGNOSIS — Z7901 Long term (current) use of anticoagulants: Secondary | ICD-10-CM | POA: Diagnosis not present

## 2015-08-30 DIAGNOSIS — Z7901 Long term (current) use of anticoagulants: Secondary | ICD-10-CM | POA: Diagnosis not present

## 2015-09-27 DIAGNOSIS — Z7901 Long term (current) use of anticoagulants: Secondary | ICD-10-CM | POA: Diagnosis not present

## 2015-10-04 DIAGNOSIS — E1151 Type 2 diabetes mellitus with diabetic peripheral angiopathy without gangrene: Secondary | ICD-10-CM | POA: Diagnosis not present

## 2015-10-04 DIAGNOSIS — N183 Chronic kidney disease, stage 3 (moderate): Secondary | ICD-10-CM | POA: Diagnosis not present

## 2015-10-04 DIAGNOSIS — E1165 Type 2 diabetes mellitus with hyperglycemia: Secondary | ICD-10-CM | POA: Diagnosis not present

## 2015-10-04 DIAGNOSIS — I129 Hypertensive chronic kidney disease with stage 1 through stage 4 chronic kidney disease, or unspecified chronic kidney disease: Secondary | ICD-10-CM | POA: Diagnosis not present

## 2015-10-04 DIAGNOSIS — E1142 Type 2 diabetes mellitus with diabetic polyneuropathy: Secondary | ICD-10-CM | POA: Diagnosis not present

## 2015-10-04 DIAGNOSIS — Z23 Encounter for immunization: Secondary | ICD-10-CM | POA: Diagnosis not present

## 2015-10-04 DIAGNOSIS — E1122 Type 2 diabetes mellitus with diabetic chronic kidney disease: Secondary | ICD-10-CM | POA: Diagnosis not present

## 2015-10-12 ENCOUNTER — Ambulatory Visit (INDEPENDENT_AMBULATORY_CARE_PROVIDER_SITE_OTHER): Payer: Medicare Other | Admitting: Podiatry

## 2015-10-12 DIAGNOSIS — B351 Tinea unguium: Secondary | ICD-10-CM | POA: Diagnosis not present

## 2015-10-12 DIAGNOSIS — M79676 Pain in unspecified toe(s): Secondary | ICD-10-CM | POA: Diagnosis not present

## 2015-10-12 NOTE — Progress Notes (Signed)
Patient ID: Richard [name], male   DOB: 1931/11/15, 81 y.o.   MRN: [id number]  Subjective: 81 y.o.-year-old male returns the office today for painful, elongated, thickened toenails which he is unable to do himself. Denies any redness or drainage around the nails. Denies any acute changes since last appointment and no new complaints today. Denies any systemic complaints such as fevers, chills, nausea, vomiting.   Objective: AAO 3, NAD DP/PT pulses palpable 1/4, CRT less than 3 seconds Protective sensation decreased with Simms Weinstein monofilament Nails hypertrophic, dystrophic, elongated, brittle, discolored 10. There is tenderness overlying the nails 1-5 bilaterally. There is no surrounding erythema or drainage along the nail sites. No open lesions or pre-ulcerative lesions are identified. No other areas of tenderness bilateral lower extremities. No overlying edema, erythema, increased warmth. No pain with calf compression, swelling, warmth, erythema.  Assessment: Patient presents with symptomatic onychomycosis  Plan: -Treatment options including alternatives, risks, complications were discussed -Nails sharply debrided 10 without complication/bleeding. -Discussed daily foot inspection. If there are any changes, to call the office immediately.  -Follow-up in 3 months or sooner if any problems are to arise. In the meantime, encouraged to call the office with any questions, concerns, changes symptoms.  Celesta Gentile, DPM

## 2015-10-25 DIAGNOSIS — Z7901 Long term (current) use of anticoagulants: Secondary | ICD-10-CM | POA: Diagnosis not present

## 2015-10-26 DIAGNOSIS — R338 Other retention of urine: Secondary | ICD-10-CM | POA: Diagnosis not present

## 2015-10-26 DIAGNOSIS — N312 Flaccid neuropathic bladder, not elsewhere classified: Secondary | ICD-10-CM | POA: Diagnosis not present

## 2015-10-26 DIAGNOSIS — N401 Enlarged prostate with lower urinary tract symptoms: Secondary | ICD-10-CM | POA: Diagnosis not present

## 2015-10-26 DIAGNOSIS — C61 Malignant neoplasm of prostate: Secondary | ICD-10-CM | POA: Diagnosis not present

## 2015-10-26 DIAGNOSIS — R972 Elevated prostate specific antigen [PSA]: Secondary | ICD-10-CM | POA: Diagnosis not present

## 2015-10-26 DIAGNOSIS — R310 Gross hematuria: Secondary | ICD-10-CM | POA: Diagnosis not present

## 2015-11-22 DIAGNOSIS — Z7901 Long term (current) use of anticoagulants: Secondary | ICD-10-CM | POA: Diagnosis not present

## 2015-12-20 DIAGNOSIS — Z7901 Long term (current) use of anticoagulants: Secondary | ICD-10-CM | POA: Diagnosis not present

## 2015-12-21 DIAGNOSIS — Z85820 Personal history of malignant melanoma of skin: Secondary | ICD-10-CM | POA: Diagnosis not present

## 2015-12-21 DIAGNOSIS — L821 Other seborrheic keratosis: Secondary | ICD-10-CM | POA: Diagnosis not present

## 2015-12-21 DIAGNOSIS — L812 Freckles: Secondary | ICD-10-CM | POA: Diagnosis not present

## 2016-01-11 ENCOUNTER — Ambulatory Visit (INDEPENDENT_AMBULATORY_CARE_PROVIDER_SITE_OTHER): Payer: Medicare Other | Admitting: Podiatry

## 2016-01-11 ENCOUNTER — Ambulatory Visit: Payer: Medicare Other

## 2016-01-11 ENCOUNTER — Encounter: Payer: Self-pay | Admitting: Podiatry

## 2016-01-11 DIAGNOSIS — M79676 Pain in unspecified toe(s): Secondary | ICD-10-CM

## 2016-01-11 DIAGNOSIS — E1149 Type 2 diabetes mellitus with other diabetic neurological complication: Secondary | ICD-10-CM | POA: Diagnosis not present

## 2016-01-11 DIAGNOSIS — B351 Tinea unguium: Secondary | ICD-10-CM | POA: Diagnosis not present

## 2016-01-11 NOTE — Progress Notes (Signed)
Patient ID: Richard [name], male   DOB: 23-Dec-1930, 82 y.o.   MRN: [id number]  Subjective: 82 y.o.-year-old male returns the office today for painful, elongated, thickened toenails which he is unable to do himself. Denies any redness or drainage around the nails. Denies any acute changes since last appointment and no new complaints today. Denies any systemic complaints such as fevers, chills, nausea, vomiting.   Objective: AAO 3, NAD DP/PT pulses palpable 1/4, CRT less than 3 seconds Protective sensation decreased with Simms Weinstein monofilament Nails hypertrophic, dystrophic, elongated, brittle, discolored 10. There is tenderness overlying the nails 1-5 bilaterally. There is no surrounding erythema or drainage along the nail sites. No open lesions or pre-ulcerative lesions are identified. No other areas of tenderness bilateral lower extremities. No overlying edema, erythema, increased warmth. No pain with calf compression, swelling, warmth, erythema.  Assessment: Patient presents with symptomatic onychomycosis  Plan: -Treatment options including alternatives, risks, complications were discussed -Nails sharply debrided 10 without complication/bleeding. -Discussed daily foot inspection. If there are any changes, to call the office immediately.  -Follow-up in 3 months or sooner if any problems are to arise. In the meantime, encouraged to call the office with any questions, concerns, changes symptoms.  Celesta Gentile, DPM

## 2016-01-17 DIAGNOSIS — Z7901 Long term (current) use of anticoagulants: Secondary | ICD-10-CM | POA: Diagnosis not present

## 2016-02-14 DIAGNOSIS — I10 Essential (primary) hypertension: Secondary | ICD-10-CM | POA: Diagnosis not present

## 2016-02-14 DIAGNOSIS — E119 Type 2 diabetes mellitus without complications: Secondary | ICD-10-CM | POA: Diagnosis not present

## 2016-02-14 DIAGNOSIS — Z7901 Long term (current) use of anticoagulants: Secondary | ICD-10-CM | POA: Diagnosis not present

## 2016-03-13 DIAGNOSIS — Z7984 Long term (current) use of oral hypoglycemic drugs: Secondary | ICD-10-CM | POA: Diagnosis not present

## 2016-03-13 DIAGNOSIS — E1121 Type 2 diabetes mellitus with diabetic nephropathy: Secondary | ICD-10-CM | POA: Diagnosis not present

## 2016-04-10 DIAGNOSIS — Z7901 Long term (current) use of anticoagulants: Secondary | ICD-10-CM | POA: Diagnosis not present

## 2016-04-11 ENCOUNTER — Encounter: Payer: Self-pay | Admitting: Podiatry

## 2016-04-11 ENCOUNTER — Ambulatory Visit (INDEPENDENT_AMBULATORY_CARE_PROVIDER_SITE_OTHER): Payer: Medicare Other | Admitting: Podiatry

## 2016-04-11 DIAGNOSIS — M79676 Pain in unspecified toe(s): Secondary | ICD-10-CM | POA: Diagnosis not present

## 2016-04-11 DIAGNOSIS — E1149 Type 2 diabetes mellitus with other diabetic neurological complication: Secondary | ICD-10-CM | POA: Diagnosis not present

## 2016-04-11 DIAGNOSIS — M2142 Flat foot [pes planus] (acquired), left foot: Secondary | ICD-10-CM

## 2016-04-11 DIAGNOSIS — B351 Tinea unguium: Secondary | ICD-10-CM

## 2016-04-11 DIAGNOSIS — M2040 Other hammer toe(s) (acquired), unspecified foot: Secondary | ICD-10-CM

## 2016-04-11 DIAGNOSIS — M2141 Flat foot [pes planus] (acquired), right foot: Secondary | ICD-10-CM

## 2016-04-11 NOTE — Progress Notes (Signed)
Patient ID: Richard [name], male   DOB: 15-Apr-1931, 82 y.o.   MRN: [id number]  Subjective: 82 y.o.-year-old male returns the office today for painful, elongated, thickened toenails which he is unable to do himself. Denies any redness or drainage around the nails. Denies any acute changes since last appointment and no new complaints today. Denies any systemic complaints such as fevers, chills, nausea, vomiting. He is requesting diabetic shoes today as he feels he rolls his foot and has neuropathy.   Objective: AAO 3, NAD DP/PT pulses palpable 1/4, CRT less than 3 seconds Protective sensation decreased with Simms Weinstein monofilament Nails hypertrophic, dystrophic, elongated, brittle, discolored 10. There is tenderness overlying the nails 1-5 bilaterally. There is no surrounding erythema or drainage along the nail sites. No open lesions or pre-ulcerative lesions are identified. No other areas of tenderness bilateral lower extremities. No overlying edema, erythema, increased warmth.Decrease in medial arch height. Hammertoes present.  No pain with calf compression, swelling, warmth, erythema.  Assessment: Patient presents with symptomatic onychomycosis  Plan: -Treatment options including alternatives, risks, complications were discussed -Nails sharply debrided 10 without complication/bleeding. -Discussed daily foot inspection. If there are any changes, to call the office immediately.  -Paperwork completed for diabetic shoes. Given neuropathy and digital and foot deformity I do believe he would benefit.  -Follow-up in 3 months or sooner if any problems are to arise. In the meantime, encouraged to call the office with any questions, concerns, changes symptoms.  Celesta Gentile, DPM

## 2016-05-08 DIAGNOSIS — R972 Elevated prostate specific antigen [PSA]: Secondary | ICD-10-CM | POA: Diagnosis not present

## 2016-05-08 DIAGNOSIS — R8299 Other abnormal findings in urine: Secondary | ICD-10-CM | POA: Diagnosis not present

## 2016-05-08 DIAGNOSIS — R21 Rash and other nonspecific skin eruption: Secondary | ICD-10-CM | POA: Diagnosis not present

## 2016-05-08 DIAGNOSIS — N312 Flaccid neuropathic bladder, not elsewhere classified: Secondary | ICD-10-CM | POA: Diagnosis not present

## 2016-05-08 DIAGNOSIS — C61 Malignant neoplasm of prostate: Secondary | ICD-10-CM | POA: Diagnosis not present

## 2016-05-08 DIAGNOSIS — Z7901 Long term (current) use of anticoagulants: Secondary | ICD-10-CM | POA: Diagnosis not present

## 2016-06-12 DIAGNOSIS — M109 Gout, unspecified: Secondary | ICD-10-CM | POA: Diagnosis not present

## 2016-06-12 DIAGNOSIS — E1142 Type 2 diabetes mellitus with diabetic polyneuropathy: Secondary | ICD-10-CM | POA: Diagnosis not present

## 2016-06-12 DIAGNOSIS — Z1389 Encounter for screening for other disorder: Secondary | ICD-10-CM | POA: Diagnosis not present

## 2016-06-12 DIAGNOSIS — E782 Mixed hyperlipidemia: Secondary | ICD-10-CM | POA: Diagnosis not present

## 2016-06-12 DIAGNOSIS — Z7984 Long term (current) use of oral hypoglycemic drugs: Secondary | ICD-10-CM | POA: Diagnosis not present

## 2016-06-12 DIAGNOSIS — D6851 Activated protein C resistance: Secondary | ICD-10-CM | POA: Diagnosis not present

## 2016-06-12 DIAGNOSIS — C61 Malignant neoplasm of prostate: Secondary | ICD-10-CM | POA: Diagnosis not present

## 2016-06-12 DIAGNOSIS — Z0000 Encounter for general adult medical examination without abnormal findings: Secondary | ICD-10-CM | POA: Diagnosis not present

## 2016-06-12 DIAGNOSIS — N183 Chronic kidney disease, stage 3 (moderate): Secondary | ICD-10-CM | POA: Diagnosis not present

## 2016-06-12 DIAGNOSIS — I129 Hypertensive chronic kidney disease with stage 1 through stage 4 chronic kidney disease, or unspecified chronic kidney disease: Secondary | ICD-10-CM | POA: Diagnosis not present

## 2016-06-12 DIAGNOSIS — E1122 Type 2 diabetes mellitus with diabetic chronic kidney disease: Secondary | ICD-10-CM | POA: Diagnosis not present

## 2016-06-12 DIAGNOSIS — Z86711 Personal history of pulmonary embolism: Secondary | ICD-10-CM | POA: Diagnosis not present

## 2016-06-19 ENCOUNTER — Ambulatory Visit (INDEPENDENT_AMBULATORY_CARE_PROVIDER_SITE_OTHER): Payer: Medicare Other | Admitting: *Deleted

## 2016-06-19 DIAGNOSIS — E1149 Type 2 diabetes mellitus with other diabetic neurological complication: Secondary | ICD-10-CM

## 2016-06-19 DIAGNOSIS — M2040 Other hammer toe(s) (acquired), unspecified foot: Secondary | ICD-10-CM

## 2016-07-05 NOTE — Progress Notes (Signed)
Measured for diabetic shoes and insoles. 

## 2016-07-08 DIAGNOSIS — Z7901 Long term (current) use of anticoagulants: Secondary | ICD-10-CM | POA: Diagnosis not present

## 2016-07-11 ENCOUNTER — Encounter: Payer: Self-pay | Admitting: Podiatry

## 2016-07-11 ENCOUNTER — Ambulatory Visit (INDEPENDENT_AMBULATORY_CARE_PROVIDER_SITE_OTHER): Payer: Medicare Other | Admitting: Podiatry

## 2016-07-11 DIAGNOSIS — B351 Tinea unguium: Secondary | ICD-10-CM | POA: Diagnosis not present

## 2016-07-11 DIAGNOSIS — M79676 Pain in unspecified toe(s): Secondary | ICD-10-CM | POA: Diagnosis not present

## 2016-07-11 DIAGNOSIS — E1149 Type 2 diabetes mellitus with other diabetic neurological complication: Secondary | ICD-10-CM | POA: Diagnosis not present

## 2016-07-11 NOTE — Progress Notes (Signed)
Patient ID: LOIC COMOLLI, male   DOB: 1931/02/13, 82 y.o.   MRN: [id number]  Subjective: [id number]year-old male returns the office today for painful, elongated, thickened toenails which he is unable to do himself. Denies any redness or drainage around the nails. Denies any acute changes since last appointment and no new complaints today. Denies any systemic complaints such as fevers, chills, nausea, vomiting. He is requesting diabetic shoes today as he feels he rolls his foot and has neuropathy.   Objective: AAO 3, NAD DP/PT pulses palpable 1/4, CRT less than 3 seconds Protective sensation decreased with Simms Weinstein monofilament Nails hypertrophic, dystrophic, elongated, brittle, discolored 10. There is tenderness overlying the nails 1-5 bilaterally. There is no surrounding erythema or drainage along the nail sites. No open lesions or pre-ulcerative lesions are identified. No other areas of tenderness bilateral lower extremities. No overlying edema, erythema, increased warmth.Decrease in medial arch height. Hammertoes present.  No pain with calf compression, swelling, warmth, erythema.  Assessment: Patient presents with symptomatic onychomycosis  Plan: -Treatment options including alternatives, risks, complications were discussed -Nails sharply debrided 10 without complication/bleeding. -Awaiting diabetic shoes.  -Discussed daily foot inspection. If there are any changes, to call the office immediately.  -Paperwork completed for diabetic shoes. Given neuropathy and digital and foot deformity I do believe he would benefit.  -Follow-up in 3 months or sooner if any problems are to arise. In the meantime, encouraged to call the office with any questions, concerns, changes symptoms.  Celesta Gentile, DPM

## 2016-08-07 DIAGNOSIS — M109 Gout, unspecified: Secondary | ICD-10-CM | POA: Diagnosis not present

## 2016-08-07 DIAGNOSIS — Z7901 Long term (current) use of anticoagulants: Secondary | ICD-10-CM | POA: Diagnosis not present

## 2016-08-21 ENCOUNTER — Ambulatory Visit (INDEPENDENT_AMBULATORY_CARE_PROVIDER_SITE_OTHER): Payer: Medicare Other | Admitting: Podiatry

## 2016-08-21 DIAGNOSIS — M2042 Other hammer toe(s) (acquired), left foot: Secondary | ICD-10-CM | POA: Diagnosis not present

## 2016-08-21 DIAGNOSIS — E1149 Type 2 diabetes mellitus with other diabetic neurological complication: Secondary | ICD-10-CM

## 2016-08-21 DIAGNOSIS — M2141 Flat foot [pes planus] (acquired), right foot: Secondary | ICD-10-CM

## 2016-08-21 DIAGNOSIS — M2142 Flat foot [pes planus] (acquired), left foot: Secondary | ICD-10-CM

## 2016-08-21 DIAGNOSIS — M2040 Other hammer toe(s) (acquired), unspecified foot: Secondary | ICD-10-CM

## 2016-08-21 DIAGNOSIS — M2041 Other hammer toe(s) (acquired), right foot: Secondary | ICD-10-CM | POA: Diagnosis not present

## 2016-08-27 DIAGNOSIS — Z7901 Long term (current) use of anticoagulants: Secondary | ICD-10-CM | POA: Diagnosis not present

## 2016-08-27 DIAGNOSIS — Z23 Encounter for immunization: Secondary | ICD-10-CM | POA: Diagnosis not present

## 2016-08-27 DIAGNOSIS — M109 Gout, unspecified: Secondary | ICD-10-CM | POA: Diagnosis not present

## 2016-08-27 NOTE — Progress Notes (Signed)
Dispensed diabetic shoes and 3 pairs of insoles. Instructions were reviewed and a copy was given to the patient. Patient to reappointment for regularly scheduled diabetic foot care visits or if he experiences any trouble with his diabetic shoes. No new concerns today.

## 2016-09-26 DIAGNOSIS — Z7901 Long term (current) use of anticoagulants: Secondary | ICD-10-CM | POA: Diagnosis not present

## 2016-10-03 ENCOUNTER — Ambulatory Visit: Payer: Medicare Other | Admitting: Podiatry

## 2016-10-10 ENCOUNTER — Ambulatory Visit: Payer: Medicare Other | Admitting: Podiatry

## 2016-10-14 DIAGNOSIS — Z7984 Long term (current) use of oral hypoglycemic drugs: Secondary | ICD-10-CM | POA: Diagnosis not present

## 2016-10-14 DIAGNOSIS — E1142 Type 2 diabetes mellitus with diabetic polyneuropathy: Secondary | ICD-10-CM | POA: Diagnosis not present

## 2016-10-14 DIAGNOSIS — I10 Essential (primary) hypertension: Secondary | ICD-10-CM | POA: Diagnosis not present

## 2016-10-14 DIAGNOSIS — N400 Enlarged prostate without lower urinary tract symptoms: Secondary | ICD-10-CM | POA: Diagnosis not present

## 2016-10-17 ENCOUNTER — Encounter: Payer: Self-pay | Admitting: Podiatry

## 2016-10-17 ENCOUNTER — Ambulatory Visit (INDEPENDENT_AMBULATORY_CARE_PROVIDER_SITE_OTHER): Payer: Medicare Other | Admitting: Podiatry

## 2016-10-17 VITALS — BP 140/79 | HR 97

## 2016-10-17 DIAGNOSIS — B351 Tinea unguium: Secondary | ICD-10-CM | POA: Diagnosis not present

## 2016-10-17 DIAGNOSIS — E1149 Type 2 diabetes mellitus with other diabetic neurological complication: Secondary | ICD-10-CM | POA: Diagnosis not present

## 2016-10-17 DIAGNOSIS — M79676 Pain in unspecified toe(s): Secondary | ICD-10-CM | POA: Diagnosis not present

## 2016-10-17 NOTE — Progress Notes (Signed)
Patient ID: Richard [name], male   DOB: 1931/06/16, 82 y.o.   MRN: [id number]  Subjective: 82y.o.-year-old male returns the office today for painful, elongated, thickened toenails which he is unable to do himself. Denies any redness or drainage around the nails. Denies any acute changes since last appointment and no new complaints today. Denies any systemic complaints such as fevers, chills, nausea, vomiting. He is requesting diabetic shoes today as he feels he rolls his foot and has neuropathy.   Objective: AAO 3, NAD DP/PT pulses palpable 1/4, CRT less than 3 seconds Protective sensation decreased with Simms Weinstein monofilament Nails hypertrophic, dystrophic, elongated, brittle, discolored 10. There is tenderness overlying the nails 1-5 bilaterally. There is no surrounding erythema or drainage along the nail sites. No open lesions or pre-ulcerative lesions are identified. No other areas of tenderness bilateral lower extremities. No overlying edema, erythema, increased warmth.Decrease in medial arch height. Hammertoes present.  No pain with calf compression, swelling, warmth, erythema.  Assessment: Patient presents with symptomatic onychomycosis  Plan: -Treatment options including alternatives, risks, complications were discussed -Nails sharply debrided 10 without complication/bleeding. -Discussed daily foot inspection. If there are any changes, to call the office immediately.  -Follow-up in 3 months or sooner if any problems are to arise. In the meantime, encouraged to call the office with any questions, concerns, changes symptoms.  Celesta Gentile, DPM

## 2016-10-24 DIAGNOSIS — Z7901 Long term (current) use of anticoagulants: Secondary | ICD-10-CM | POA: Diagnosis not present

## 2016-11-20 DIAGNOSIS — N390 Urinary tract infection, site not specified: Secondary | ICD-10-CM | POA: Diagnosis not present

## 2016-11-20 DIAGNOSIS — R972 Elevated prostate specific antigen [PSA]: Secondary | ICD-10-CM | POA: Diagnosis not present

## 2016-11-20 DIAGNOSIS — D6851 Activated protein C resistance: Secondary | ICD-10-CM | POA: Diagnosis not present

## 2016-11-20 DIAGNOSIS — Z7901 Long term (current) use of anticoagulants: Secondary | ICD-10-CM | POA: Diagnosis not present

## 2016-11-20 DIAGNOSIS — N312 Flaccid neuropathic bladder, not elsewhere classified: Secondary | ICD-10-CM | POA: Diagnosis not present

## 2016-11-20 DIAGNOSIS — R310 Gross hematuria: Secondary | ICD-10-CM | POA: Diagnosis not present

## 2016-11-20 DIAGNOSIS — R9721 Rising PSA following treatment for malignant neoplasm of prostate: Secondary | ICD-10-CM | POA: Diagnosis not present

## 2016-11-20 DIAGNOSIS — C61 Malignant neoplasm of prostate: Secondary | ICD-10-CM | POA: Diagnosis not present

## 2016-11-20 DIAGNOSIS — R8299 Other abnormal findings in urine: Secondary | ICD-10-CM | POA: Diagnosis not present

## 2016-11-20 DIAGNOSIS — N401 Enlarged prostate with lower urinary tract symptoms: Secondary | ICD-10-CM | POA: Diagnosis not present

## 2016-11-28 DIAGNOSIS — Z7901 Long term (current) use of anticoagulants: Secondary | ICD-10-CM | POA: Diagnosis not present

## 2016-12-19 DIAGNOSIS — L821 Other seborrheic keratosis: Secondary | ICD-10-CM | POA: Diagnosis not present

## 2016-12-19 DIAGNOSIS — L812 Freckles: Secondary | ICD-10-CM | POA: Diagnosis not present

## 2016-12-19 DIAGNOSIS — L905 Scar conditions and fibrosis of skin: Secondary | ICD-10-CM | POA: Diagnosis not present

## 2016-12-26 DIAGNOSIS — Z7901 Long term (current) use of anticoagulants: Secondary | ICD-10-CM | POA: Diagnosis not present

## 2017-01-13 ENCOUNTER — Encounter: Payer: Self-pay | Admitting: Podiatry

## 2017-01-13 ENCOUNTER — Ambulatory Visit (INDEPENDENT_AMBULATORY_CARE_PROVIDER_SITE_OTHER): Payer: Medicare Other | Admitting: Podiatry

## 2017-01-13 DIAGNOSIS — E1149 Type 2 diabetes mellitus with other diabetic neurological complication: Secondary | ICD-10-CM

## 2017-01-13 DIAGNOSIS — B351 Tinea unguium: Secondary | ICD-10-CM | POA: Diagnosis not present

## 2017-01-13 DIAGNOSIS — M79676 Pain in unspecified toe(s): Secondary | ICD-10-CM

## 2017-01-13 DIAGNOSIS — M2142 Flat foot [pes planus] (acquired), left foot: Secondary | ICD-10-CM

## 2017-01-13 DIAGNOSIS — M2141 Flat foot [pes planus] (acquired), right foot: Secondary | ICD-10-CM

## 2017-01-13 NOTE — Progress Notes (Signed)
Complaint:  Visit Type: Patient returns to my office for continued preventative foot care services. Complaint: Patient states" my nails have grown long and thick and become painful to walk and wear shoes" Patient has been diagnosed with DM with no foot complications. The patient presents for preventative foot care services. No changes to ROS  Podiatric Exam: Vascular: dorsalis pedis and posterior tibial pulses are palpable bilateral. Capillary return is immediate. Temperature gradient is WNL. Skin turgor WNL  Sensorium: Diminished  Semmes Weinstein monofilament test. Normal tactile sensation bilaterally. Nail Exam: Pt has thick disfigured discolored nails with subungual debris noted bilateral entire nail hallux through fifth toenails Ulcer Exam: There is no evidence of ulcer or pre-ulcerative changes or infection. Orthopedic Exam: Muscle tone and strength are WNL. No limitations in general ROM. No crepitus or effusions noted. Foot type and digits show no abnormalities. Bony prominences are unremarkable. Pes planus.  Hammer toes. Skin: No Porokeratosis. No infection or ulcers  Diagnosis:  Onychomycosis, , Pain in right toe, pain in left toes  Treatment & Plan Procedures and Treatment: Consent by patient was obtained for treatment procedures. The patient understood the discussion of treatment and procedures well. All questions were answered thoroughly reviewed. Debridement of mycotic and hypertrophic toenails, 1 through 5 bilateral and clearing of subungual debris. No ulceration, no infection noted. To obtain diabetic shoes next visit. Return Visit-Office Procedure: Patient instructed to return to the office for a follow up visit 3 months for continued evaluation and treatment.    Gardiner Barefoot DPM

## 2017-01-20 ENCOUNTER — Ambulatory Visit: Payer: Medicare Other | Admitting: Podiatry

## 2017-01-23 DIAGNOSIS — Z7901 Long term (current) use of anticoagulants: Secondary | ICD-10-CM | POA: Diagnosis not present

## 2017-02-13 DIAGNOSIS — N183 Chronic kidney disease, stage 3 (moderate): Secondary | ICD-10-CM | POA: Diagnosis not present

## 2017-02-13 DIAGNOSIS — N400 Enlarged prostate without lower urinary tract symptoms: Secondary | ICD-10-CM | POA: Diagnosis not present

## 2017-02-13 DIAGNOSIS — E1122 Type 2 diabetes mellitus with diabetic chronic kidney disease: Secondary | ICD-10-CM | POA: Diagnosis not present

## 2017-02-13 DIAGNOSIS — Z7901 Long term (current) use of anticoagulants: Secondary | ICD-10-CM | POA: Diagnosis not present

## 2017-02-13 DIAGNOSIS — I739 Peripheral vascular disease, unspecified: Secondary | ICD-10-CM | POA: Diagnosis not present

## 2017-02-13 DIAGNOSIS — E1151 Type 2 diabetes mellitus with diabetic peripheral angiopathy without gangrene: Secondary | ICD-10-CM | POA: Diagnosis not present

## 2017-02-13 DIAGNOSIS — I129 Hypertensive chronic kidney disease with stage 1 through stage 4 chronic kidney disease, or unspecified chronic kidney disease: Secondary | ICD-10-CM | POA: Diagnosis not present

## 2017-02-13 DIAGNOSIS — Z7984 Long term (current) use of oral hypoglycemic drugs: Secondary | ICD-10-CM | POA: Diagnosis not present

## 2017-02-13 DIAGNOSIS — E1142 Type 2 diabetes mellitus with diabetic polyneuropathy: Secondary | ICD-10-CM | POA: Diagnosis not present

## 2017-02-13 DIAGNOSIS — M109 Gout, unspecified: Secondary | ICD-10-CM | POA: Diagnosis not present

## 2017-02-20 DIAGNOSIS — Z7901 Long term (current) use of anticoagulants: Secondary | ICD-10-CM | POA: Diagnosis not present

## 2017-03-20 DIAGNOSIS — Z7901 Long term (current) use of anticoagulants: Secondary | ICD-10-CM | POA: Diagnosis not present

## 2017-04-07 ENCOUNTER — Encounter: Payer: Self-pay | Admitting: Podiatry

## 2017-04-07 ENCOUNTER — Ambulatory Visit (INDEPENDENT_AMBULATORY_CARE_PROVIDER_SITE_OTHER): Payer: Medicare Other | Admitting: Podiatry

## 2017-04-07 DIAGNOSIS — E1149 Type 2 diabetes mellitus with other diabetic neurological complication: Secondary | ICD-10-CM

## 2017-04-07 DIAGNOSIS — B351 Tinea unguium: Secondary | ICD-10-CM

## 2017-04-07 DIAGNOSIS — M79675 Pain in left toe(s): Secondary | ICD-10-CM | POA: Diagnosis not present

## 2017-04-07 NOTE — Progress Notes (Signed)
Complaint:  Visit Type: Patient returns to my office for continued preventative foot care services. Complaint: Patient states" my nails have grown long and thick and become painful to walk and wear shoes" Patient has been diagnosed with DM with no foot complications. The patient presents for preventative foot care services.  Podiatric Exam: Vascular: dorsalis pedis and posterior tibial pulses are palpable bilateral. Capillary return is immediate. Temperature gradient is WNL. Skin turgor WNL  Sensorium: Diminished  Semmes Weinstein monofilament test. Normal tactile sensation bilaterally. Nail Exam: Pt has thick disfigured discolored nails with subungual debris noted bilateral entire nail hallux through fifth toenails Ulcer Exam: There is no evidence of ulcer or pre-ulcerative changes or infection. Orthopedic Exam: Muscle tone and strength are WNL. No limitations in general ROM. No crepitus or effusions noted. Foot type and digits show no abnormalities. Bony prominences are unremarkable. Pes planus.  Hammer toes. Swelling  B/L Skin: No Porokeratosis. No infection or ulcers  Diagnosis:  Onychomycosis, , Pain in right toe, pain in left toes  Treatment & Plan Procedures and Treatment: Consent by patient was obtained for treatment procedures. The patient understood the discussion of treatment and procedures well. All questions were answered thoroughly reviewed. Debridement of mycotic and hypertrophic toenails, 1 through 5 bilateral and clearing of subungual debris. No ulceration, no infection noted. To obtain diabetic shoes next visit. Return Visit-Office Procedure: Patient instructed to return to the office for a follow up visit 3 months for continued evaluation and treatment.    Gardiner Barefoot DPM

## 2017-04-17 DIAGNOSIS — Z7901 Long term (current) use of anticoagulants: Secondary | ICD-10-CM | POA: Diagnosis not present

## 2017-05-15 DIAGNOSIS — M109 Gout, unspecified: Secondary | ICD-10-CM | POA: Diagnosis not present

## 2017-05-15 DIAGNOSIS — Z7901 Long term (current) use of anticoagulants: Secondary | ICD-10-CM | POA: Diagnosis not present

## 2017-05-21 DIAGNOSIS — C61 Malignant neoplasm of prostate: Secondary | ICD-10-CM | POA: Diagnosis not present

## 2017-05-21 DIAGNOSIS — N529 Male erectile dysfunction, unspecified: Secondary | ICD-10-CM | POA: Diagnosis not present

## 2017-05-21 DIAGNOSIS — N528 Other male erectile dysfunction: Secondary | ICD-10-CM | POA: Diagnosis not present

## 2017-05-21 DIAGNOSIS — N312 Flaccid neuropathic bladder, not elsewhere classified: Secondary | ICD-10-CM | POA: Diagnosis not present

## 2017-05-21 DIAGNOSIS — R972 Elevated prostate specific antigen [PSA]: Secondary | ICD-10-CM | POA: Diagnosis not present

## 2017-06-11 DIAGNOSIS — Z1389 Encounter for screening for other disorder: Secondary | ICD-10-CM | POA: Diagnosis not present

## 2017-06-11 DIAGNOSIS — E1142 Type 2 diabetes mellitus with diabetic polyneuropathy: Secondary | ICD-10-CM | POA: Diagnosis not present

## 2017-06-11 DIAGNOSIS — E782 Mixed hyperlipidemia: Secondary | ICD-10-CM | POA: Diagnosis not present

## 2017-06-11 DIAGNOSIS — M109 Gout, unspecified: Secondary | ICD-10-CM | POA: Diagnosis not present

## 2017-06-11 DIAGNOSIS — Z135 Encounter for screening for eye and ear disorders: Secondary | ICD-10-CM | POA: Diagnosis not present

## 2017-06-11 DIAGNOSIS — E1151 Type 2 diabetes mellitus with diabetic peripheral angiopathy without gangrene: Secondary | ICD-10-CM | POA: Diagnosis not present

## 2017-06-11 DIAGNOSIS — E1122 Type 2 diabetes mellitus with diabetic chronic kidney disease: Secondary | ICD-10-CM | POA: Diagnosis not present

## 2017-06-11 DIAGNOSIS — C61 Malignant neoplasm of prostate: Secondary | ICD-10-CM | POA: Diagnosis not present

## 2017-06-11 DIAGNOSIS — E119 Type 2 diabetes mellitus without complications: Secondary | ICD-10-CM | POA: Diagnosis not present

## 2017-06-11 DIAGNOSIS — N183 Chronic kidney disease, stage 3 (moderate): Secondary | ICD-10-CM | POA: Diagnosis not present

## 2017-06-11 DIAGNOSIS — Z0000 Encounter for general adult medical examination without abnormal findings: Secondary | ICD-10-CM | POA: Diagnosis not present

## 2017-06-11 DIAGNOSIS — N400 Enlarged prostate without lower urinary tract symptoms: Secondary | ICD-10-CM | POA: Diagnosis not present

## 2017-06-11 DIAGNOSIS — I739 Peripheral vascular disease, unspecified: Secondary | ICD-10-CM | POA: Diagnosis not present

## 2017-06-11 DIAGNOSIS — I129 Hypertensive chronic kidney disease with stage 1 through stage 4 chronic kidney disease, or unspecified chronic kidney disease: Secondary | ICD-10-CM | POA: Diagnosis not present

## 2017-06-11 DIAGNOSIS — Z7901 Long term (current) use of anticoagulants: Secondary | ICD-10-CM | POA: Diagnosis not present

## 2017-07-07 ENCOUNTER — Ambulatory Visit (INDEPENDENT_AMBULATORY_CARE_PROVIDER_SITE_OTHER): Payer: Medicare Other | Admitting: Podiatry

## 2017-07-07 ENCOUNTER — Encounter: Payer: Self-pay | Admitting: Podiatry

## 2017-07-07 DIAGNOSIS — M79676 Pain in unspecified toe(s): Secondary | ICD-10-CM

## 2017-07-07 DIAGNOSIS — B351 Tinea unguium: Secondary | ICD-10-CM

## 2017-07-07 DIAGNOSIS — E1149 Type 2 diabetes mellitus with other diabetic neurological complication: Secondary | ICD-10-CM

## 2017-07-07 DIAGNOSIS — M79675 Pain in left toe(s): Principal | ICD-10-CM

## 2017-07-07 NOTE — Progress Notes (Signed)
Complaint:  Visit Type: Patient returns to my office for continued preventative foot care services. Complaint: Patient states" my nails have grown long and thick and become painful to walk and wear shoes" Patient has been diagnosed with DM with no foot complications. The patient presents for preventative foot care services.  Podiatric Exam: Vascular: dorsalis pedis and posterior tibial pulses are palpable bilateral. Capillary return is immediate. Temperature gradient is WNL. Skin turgor WNL  Sensorium: Diminished  Semmes Weinstein monofilament test. Normal tactile sensation bilaterally. Nail Exam: Pt has thick disfigured discolored nails with subungual debris noted bilateral entire nail hallux through fifth toenails Ulcer Exam: There is no evidence of ulcer or pre-ulcerative changes or infection. Orthopedic Exam: Muscle tone and strength are WNL. No limitations in general ROM. No crepitus or effusions noted. Foot type and digits show no abnormalities. Bony prominences are unremarkable. Pes planus.  Hammer toes. Swelling  B/L Skin: No Porokeratosis. No infection or ulcers  Diagnosis:  Onychomycosis, , Pain in right toe, pain in left toes  Treatment & Plan Procedures and Treatment: Consent by patient was obtained for treatment procedures. The patient understood the discussion of treatment and procedures well. All questions were answered thoroughly reviewed. Debridement of mycotic and hypertrophic toenails, 1 through 5 bilateral and clearing of subungual debris. No ulceration, no infection noted. To obtain diabetic shoes next visit. Return Visit-Office Procedure: Patient instructed to return to the office for a follow up visit 10 weeks  for continued evaluation and treatment.    Gardiner Barefoot DPM

## 2017-07-10 DIAGNOSIS — Z7901 Long term (current) use of anticoagulants: Secondary | ICD-10-CM | POA: Diagnosis not present

## 2017-08-05 DIAGNOSIS — Z7901 Long term (current) use of anticoagulants: Secondary | ICD-10-CM | POA: Diagnosis not present

## 2017-09-02 DIAGNOSIS — M109 Gout, unspecified: Secondary | ICD-10-CM | POA: Diagnosis not present

## 2017-09-02 DIAGNOSIS — Z7901 Long term (current) use of anticoagulants: Secondary | ICD-10-CM | POA: Diagnosis not present

## 2017-09-18 ENCOUNTER — Encounter: Payer: Self-pay | Admitting: Podiatry

## 2017-09-18 ENCOUNTER — Ambulatory Visit (INDEPENDENT_AMBULATORY_CARE_PROVIDER_SITE_OTHER): Payer: Medicare Other | Admitting: Podiatry

## 2017-09-18 DIAGNOSIS — M2142 Flat foot [pes planus] (acquired), left foot: Secondary | ICD-10-CM

## 2017-09-18 DIAGNOSIS — M2141 Flat foot [pes planus] (acquired), right foot: Secondary | ICD-10-CM

## 2017-09-18 DIAGNOSIS — E1149 Type 2 diabetes mellitus with other diabetic neurological complication: Secondary | ICD-10-CM

## 2017-09-18 DIAGNOSIS — M2042 Other hammer toe(s) (acquired), left foot: Secondary | ICD-10-CM

## 2017-09-18 DIAGNOSIS — M79675 Pain in left toe(s): Secondary | ICD-10-CM

## 2017-09-18 DIAGNOSIS — B351 Tinea unguium: Secondary | ICD-10-CM | POA: Diagnosis not present

## 2017-09-18 DIAGNOSIS — M2041 Other hammer toe(s) (acquired), right foot: Secondary | ICD-10-CM

## 2017-09-18 NOTE — Progress Notes (Signed)
Complaint:  Visit Type: Patient returns to my office for continued preventative foot care services. Complaint: Patient states" my nails have grown long and thick and become painful to walk and wear shoes" Patient has been diagnosed with DM with no foot complications. The patient presents for preventative foot care services.  Podiatric Exam: Vascular: dorsalis pedis and posterior tibial pulses are palpable bilateral. Capillary return is immediate. Temperature gradient is WNL. Skin turgor WNL  Sensorium: Diminished  Semmes Weinstein monofilament test. Normal tactile sensation bilaterally. Nail Exam: Pt has thick disfigured discolored nails with subungual debris noted bilateral entire nail hallux through fifth toenails Ulcer Exam: There is no evidence of ulcer or pre-ulcerative changes or infection. Orthopedic Exam: Muscle tone and strength are WNL. No limitations in general ROM. No crepitus or effusions noted. Foot type and digits show no abnormalities. Bony prominences are unremarkable. Pes planus.  Hammer toes. Swelling  B/L Skin: No Porokeratosis. No infection or ulcers  Diagnosis:  Onychomycosis, , Pain in right toe, pain in left toes  Treatment & Plan Procedures and Treatment: Consent by patient was obtained for treatment procedures. The patient understood the discussion of treatment and procedures well. All questions were answered thoroughly reviewed. Debridement of mycotic and hypertrophic toenails, 1 through 5 bilateral and clearing of subungual debris. No ulceration, no infection noted. ANB  Signed for  2018.  Initiate diabetic shoes paperwork for DPN, hammer toes and pes planus. Return Visit-Office Procedure: Patient instructed to return to the office for a follow up visit 10 weeks  for continued evaluation and treatment.    Gardiner Barefoot DPM

## 2017-09-30 DIAGNOSIS — Z23 Encounter for immunization: Secondary | ICD-10-CM | POA: Diagnosis not present

## 2017-09-30 DIAGNOSIS — M109 Gout, unspecified: Secondary | ICD-10-CM | POA: Diagnosis not present

## 2017-09-30 DIAGNOSIS — Z7901 Long term (current) use of anticoagulants: Secondary | ICD-10-CM | POA: Diagnosis not present

## 2017-10-09 DIAGNOSIS — E1122 Type 2 diabetes mellitus with diabetic chronic kidney disease: Secondary | ICD-10-CM | POA: Diagnosis not present

## 2017-10-09 DIAGNOSIS — E1151 Type 2 diabetes mellitus with diabetic peripheral angiopathy without gangrene: Secondary | ICD-10-CM | POA: Diagnosis not present

## 2017-10-09 DIAGNOSIS — M109 Gout, unspecified: Secondary | ICD-10-CM | POA: Diagnosis not present

## 2017-10-09 DIAGNOSIS — I129 Hypertensive chronic kidney disease with stage 1 through stage 4 chronic kidney disease, or unspecified chronic kidney disease: Secondary | ICD-10-CM | POA: Diagnosis not present

## 2017-10-09 DIAGNOSIS — E1142 Type 2 diabetes mellitus with diabetic polyneuropathy: Secondary | ICD-10-CM | POA: Diagnosis not present

## 2017-10-09 DIAGNOSIS — N183 Chronic kidney disease, stage 3 (moderate): Secondary | ICD-10-CM | POA: Diagnosis not present

## 2017-10-28 DIAGNOSIS — M109 Gout, unspecified: Secondary | ICD-10-CM | POA: Diagnosis not present

## 2017-10-28 DIAGNOSIS — Z7901 Long term (current) use of anticoagulants: Secondary | ICD-10-CM | POA: Diagnosis not present

## 2017-12-01 ENCOUNTER — Encounter: Payer: Self-pay | Admitting: Podiatry

## 2017-12-01 ENCOUNTER — Ambulatory Visit (INDEPENDENT_AMBULATORY_CARE_PROVIDER_SITE_OTHER): Payer: Medicare Other | Admitting: Podiatry

## 2017-12-01 DIAGNOSIS — D689 Coagulation defect, unspecified: Secondary | ICD-10-CM

## 2017-12-01 DIAGNOSIS — M79675 Pain in left toe(s): Secondary | ICD-10-CM

## 2017-12-01 DIAGNOSIS — E1149 Type 2 diabetes mellitus with other diabetic neurological complication: Secondary | ICD-10-CM

## 2017-12-01 DIAGNOSIS — M2041 Other hammer toe(s) (acquired), right foot: Secondary | ICD-10-CM

## 2017-12-01 DIAGNOSIS — B351 Tinea unguium: Secondary | ICD-10-CM | POA: Diagnosis not present

## 2017-12-01 DIAGNOSIS — M2042 Other hammer toe(s) (acquired), left foot: Secondary | ICD-10-CM

## 2017-12-01 NOTE — Patient Instructions (Signed)

## 2017-12-01 NOTE — Progress Notes (Addendum)
Complaint:  Visit Type: Patient returns to my office for continued preventative foot care services. Complaint: Patient states" my nails have grown long and thick and become painful to walk and wear shoes" Patient has been diagnosed with DM with no foot complications. The patient presents for preventative foot care services.Patient is taking coumadin.  Podiatric Exam: Vascular: dorsalis pedis and posterior tibial pulses are palpable bilateral. Capillary return is immediate. Temperature gradient is WNL. Skin turgor WNL  Sensorium: Diminished  Semmes Weinstein monofilament test. Normal tactile sensation bilaterally. Nail Exam: Pt has thick disfigured discolored nails with subungual debris noted bilateral entire nail hallux through fifth toenails Ulcer Exam: There is no evidence of ulcer or pre-ulcerative changes or infection. Orthopedic Exam: Muscle tone and strength are WNL. No limitations in general ROM. No crepitus or effusions noted. Foot type and digits show no abnormalities. Bony prominences are unremarkable. Pes planus.  Hammer toes. Swelling  B/L Skin: No Porokeratosis. No infection or ulcers  Diagnosis:  Onychomycosis, , Pain in right toe, pain in left toes  Treatment & Plan Procedures and Treatment: Consent by patient was obtained for treatment procedures. The patient understood the discussion of treatment and procedures well. All questions were answered thoroughly reviewed. Debridement of mycotic and hypertrophic toenails, 1 through 5 bilateral and clearing of subungual debris. No ulceration, no infection noted. ANB  Signed for  2018.  Need to get paperwork for diabetic shoes.   Return Visit-Office Procedure: Patient instructed to return to the office for a follow up visit 10 weeks  for continued evaluation and treatment.    Gardiner Barefoot DPM

## 2017-12-04 DIAGNOSIS — N529 Male erectile dysfunction, unspecified: Secondary | ICD-10-CM | POA: Diagnosis not present

## 2017-12-04 DIAGNOSIS — N312 Flaccid neuropathic bladder, not elsewhere classified: Secondary | ICD-10-CM | POA: Diagnosis not present

## 2017-12-04 DIAGNOSIS — R972 Elevated prostate specific antigen [PSA]: Secondary | ICD-10-CM | POA: Diagnosis not present

## 2017-12-04 DIAGNOSIS — R339 Retention of urine, unspecified: Secondary | ICD-10-CM | POA: Diagnosis not present

## 2017-12-04 DIAGNOSIS — C61 Malignant neoplasm of prostate: Secondary | ICD-10-CM | POA: Diagnosis not present

## 2017-12-25 DIAGNOSIS — Z7901 Long term (current) use of anticoagulants: Secondary | ICD-10-CM | POA: Diagnosis not present

## 2018-01-22 DIAGNOSIS — Z7901 Long term (current) use of anticoagulants: Secondary | ICD-10-CM | POA: Diagnosis not present

## 2018-02-04 ENCOUNTER — Ambulatory Visit: Payer: Medicare Other | Admitting: Orthotics

## 2018-02-11 ENCOUNTER — Ambulatory Visit: Payer: Medicare Other | Admitting: Orthotics

## 2018-02-18 DIAGNOSIS — Z7901 Long term (current) use of anticoagulants: Secondary | ICD-10-CM | POA: Diagnosis not present

## 2018-03-18 DIAGNOSIS — Z7901 Long term (current) use of anticoagulants: Secondary | ICD-10-CM | POA: Diagnosis not present

## 2018-03-26 ENCOUNTER — Encounter: Payer: Self-pay | Admitting: Podiatry

## 2018-03-26 ENCOUNTER — Ambulatory Visit (INDEPENDENT_AMBULATORY_CARE_PROVIDER_SITE_OTHER): Payer: Medicare Other | Admitting: Podiatry

## 2018-03-26 DIAGNOSIS — D689 Coagulation defect, unspecified: Secondary | ICD-10-CM

## 2018-03-26 DIAGNOSIS — B351 Tinea unguium: Secondary | ICD-10-CM | POA: Diagnosis not present

## 2018-03-26 DIAGNOSIS — M79675 Pain in left toe(s): Secondary | ICD-10-CM | POA: Diagnosis not present

## 2018-03-26 DIAGNOSIS — E1149 Type 2 diabetes mellitus with other diabetic neurological complication: Secondary | ICD-10-CM

## 2018-03-26 NOTE — Progress Notes (Signed)
Complaint:  Visit Type: Patient returns to my office for continued preventative foot care services. Complaint: Patient states" my nails have grown long and thick and become painful to walk and wear shoes" Patient has been diagnosed with DM with no foot complications. The patient presents for preventative foot care services.  Podiatric Exam: Vascular: dorsalis pedis and posterior tibial pulses are palpable bilateral. Capillary return is immediate. Temperature gradient is WNL. Skin turgor WNL  Sensorium: Diminished  Semmes Weinstein monofilament test. Normal tactile sensation bilaterally. Nail Exam: Pt has thick disfigured discolored nails with subungual debris noted bilateral entire nail hallux through fifth toenails Ulcer Exam: There is no evidence of ulcer or pre-ulcerative changes or infection. Orthopedic Exam: Muscle tone and strength are WNL. No limitations in general ROM. No crepitus or effusions noted. Foot type and digits show no abnormalities. Bony prominences are unremarkable. Pes planus.  Hammer toes. Swelling  B/L Skin: No Porokeratosis. No infection or ulcers  Diagnosis:  Onychomycosis, , Pain in right toe, pain in left toes  Treatment & Plan Procedures and Treatment: Consent by patient was obtained for treatment procedures. The patient understood the discussion of treatment and procedures well. All questions were answered thoroughly reviewed. Debridement of mycotic and hypertrophic toenails, 1 through 5 bilateral and clearing of subungual debris. No ulceration, no infection noted. ANB  Signed for  2019. Return Visit-Office Procedure: Patient instructed to return to the office for a follow up visit 10 weeks  for continued evaluation and treatment.    Gardiner Barefoot DPM

## 2018-04-15 DIAGNOSIS — Z7901 Long term (current) use of anticoagulants: Secondary | ICD-10-CM | POA: Diagnosis not present

## 2018-05-11 ENCOUNTER — Encounter: Payer: Self-pay | Admitting: Emergency Medicine

## 2018-05-11 ENCOUNTER — Emergency Department
Admission: EM | Admit: 2018-05-11 | Discharge: 2018-05-11 | Disposition: A | Discharge status: Home / Self Care | Payer: Medicare Other | Attending: Emergency Medicine | Admitting: Emergency Medicine

## 2018-05-11 ENCOUNTER — Other Ambulatory Visit: Payer: Self-pay

## 2018-05-11 ENCOUNTER — Emergency Department: Payer: Medicare Other

## 2018-05-11 DIAGNOSIS — Y9259 Other trade areas as the place of occurrence of the external cause: Secondary | ICD-10-CM | POA: Diagnosis not present

## 2018-05-11 DIAGNOSIS — Z79899 Other long term (current) drug therapy: Secondary | ICD-10-CM | POA: Insufficient documentation

## 2018-05-11 DIAGNOSIS — M25511 Pain in right shoulder: Secondary | ICD-10-CM | POA: Diagnosis not present

## 2018-05-11 DIAGNOSIS — Y998 Other external cause status: Secondary | ICD-10-CM | POA: Insufficient documentation

## 2018-05-11 DIAGNOSIS — N189 Chronic kidney disease, unspecified: Secondary | ICD-10-CM | POA: Insufficient documentation

## 2018-05-11 DIAGNOSIS — S4991XA Unspecified injury of right shoulder and upper arm, initial encounter: Secondary | ICD-10-CM | POA: Diagnosis present

## 2018-05-11 DIAGNOSIS — Z7901 Long term (current) use of anticoagulants: Secondary | ICD-10-CM | POA: Diagnosis not present

## 2018-05-11 DIAGNOSIS — Y939 Activity, unspecified: Secondary | ICD-10-CM | POA: Diagnosis not present

## 2018-05-11 DIAGNOSIS — Z8546 Personal history of malignant neoplasm of prostate: Secondary | ICD-10-CM | POA: Insufficient documentation

## 2018-05-11 DIAGNOSIS — S40011A Contusion of right shoulder, initial encounter: Secondary | ICD-10-CM | POA: Insufficient documentation

## 2018-05-11 DIAGNOSIS — E119 Type 2 diabetes mellitus without complications: Secondary | ICD-10-CM | POA: Insufficient documentation

## 2018-05-11 DIAGNOSIS — W010XXA Fall on same level from slipping, tripping and stumbling without subsequent striking against object, initial encounter: Secondary | ICD-10-CM | POA: Diagnosis not present

## 2018-05-11 DIAGNOSIS — W19XXXA Unspecified fall, initial encounter: Secondary | ICD-10-CM

## 2018-05-11 NOTE — ED Provider Notes (Signed)
[facility] Emergency Department Provider Note   ____________________________________________   First MD Initiated Contact with Patient 05/11/18 1113     (approximate)  I have reviewed the triage vital signs and the nursing notes.   HISTORY  Chief Complaint Shoulder Pain   HPI Richard [name] is a 84 y.o. male is here with complaint of right shoulder pain.  Patient states that he tripped at the [place] on his right shoulder.  He denies hitting his head or loss of consciousness.  Patient states that there is pain with movement only.  Patient has been using heat to his shoulder without any improvement.  Currently he rates his pain as 0/10.   Past Medical History:  Diagnosis Date  . Cancer ([place])   . Chronic kidney disease   . Colon polyp   . Diabetes mellitus   . History of blood clotting disorder     Patient Active Problem List   Diagnosis Date Noted  . Malignant tumor of prostate ([place]) 10/26/2015  . Urinary tract infection with hematuria 01/11/2015  . Bladder neck obstruction 11/04/2012  . 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 Prostate cancer ([place]) 11/04/2012  . Retention of urine 05/06/2012  . History of hematuria 01/30/2012  . Malignant neoplasm of prostate ([place]) 09/30/2011  . Gross hematuria 09/19/2011  . Benign prostatic hyperplasia with urinary retention 09/10/2011  . Elevated prostate specific antigen (PSA) 09/10/2011    Past Surgical History:  Procedure Laterality Date  . EYE SURGERY     CATARACTS  . HERNIA REPAIR    . SKIN CANCER EXCISION      Prior to Admission medications   Medication Sig Start Date End Date Taking? Authorizing Provider  Alpha-Lipoic Acid 200 MG CAPS Take by mouth.    [provider]  ALPRAZolam Duanne Moron) 0.25 MG tablet Take 0.25 mg by mouth at bedtime as needed.      [provider]  atorvastatin (LIPITOR) 10 MG tablet Take 10 mg by mouth daily.      [provider]    Calcium Carbonate (CALCIUM 600 PO) Take by mouth.      [provider]  Cholecalciferol (VITAMIN D) 2000 UNITS tablet Take 2,000 Units by mouth daily.      [provider]  CIALIS 5 MG tablet TK 1/2 T PO QD 06/16/17   [provider]  Coenzyme Q10 10 MG capsule Take 10 mg by mouth.    [provider]  colchicine 0.6 MG tablet Take 0.6 mg by mouth.    [provider]  Febuxostat (ULORIC) 80 MG TABS Take by mouth.      [provider]  glimepiride (AMARYL) 2 MG tablet Take 2 mg by mouth daily before breakfast.      [provider]  glimepiride (AMARYL) 4 MG tablet TK 1 T PO QD WITH BRE OR THE FIRST MAIN MEAL OF THE DAY 06/15/17   [provider]  ibandronate (BONIVA) 150 MG tablet Take 150 mg by mouth every 30 (thirty) days. Take in the morning with a full glass of water, on an empty stomach, and do not take anything else by mouth or lie down for the next 30 min.     [provider]  Ibuprofen (ADVIL PO) Take by mouth.      [provider]  lisinopril (PRINIVIL,ZESTRIL) 5 MG tablet Take 5 mg by mouth daily.      [provider]  Multiple Vitamins-Minerals (MULTIVITAMIN ADULT  EXTRA C PO) Take by mouth.    [provider]  Multiple Vitamins-Minerals (MULTIVITAMIN ADULT PO) Take by mouth.    [provider]  Red Yeast Rice 600 MG TABS Take by mouth.    [provider]  tadalafil (CIALIS) 5 MG tablet Take 5 mg by mouth. 05/21/17   [provider]  tadalafil (CIALIS) 5 MG tablet TK 1/2 T PO QD 09/16/17   [provider]  Vitamins A & D (VITAMIN A & D) [address] UNITS TABS Take by mouth.      [provider]  warfarin (COUMADIN) 5 MG tablet Take 5 mg by mouth daily.      [provider]    Allergies Patient has no known allergies.  History reviewed. No pertinent family history.  Social History Social History   Tobacco Use  . Smoking status:  Unknown If Ever Smoked  . Smokeless tobacco: Never Used  Substance Use Topics  . Alcohol use: No  . Drug use: Not on file    Review of Systems Constitutional: No fever/chills Eyes: No visual changes. ENT: No trauma. Cardiovascular: Denies chest pain. Respiratory: Denies shortness of breath. Gastrointestinal:   No nausea, no vomiting.  Musculoskeletal: Positive for right shoulder pain. Skin: Negative for rash. Neurological: Negative for headaches, focal weakness or numbness. ___________________________________________   PHYSICAL EXAM:  VITAL SIGNS: ED Triage Vitals  Enc Vitals Group     BP 05/11/18 1036 117/62     Pulse Rate 05/11/18 1036 77     Resp 05/11/18 1036 18     Temp 05/11/18 1036 98.5 F (36.9 C)     Temp Source 05/11/18 1036 Oral     SpO2 05/11/18 1036 97 %     Weight 05/11/18 1036 223 lb (101.2 kg)     Height 05/11/18 1036 6\' 2"  (1.88 m)     Head Circumference --      Peak Flow --      Pain Score 05/11/18 1044 0     Pain Loc --      Pain Edu? --      Excl. in [place]? --    Constitutional: Alert and oriented. Well appearing and in no acute distress. Eyes: Conjunctivae are normal.  Head: Atraumatic. Nose: No trauma. Neck: No stridor.  No cervical tenderness on palpation posteriorly. Cardiovascular: Normal rate, regular rhythm. Grossly normal heart sounds.  Good peripheral circulation. Respiratory: Normal respiratory effort.  No retractions. Lungs CTAB.  Nontender ribs to palpation. Musculoskeletal: On examination of the right shoulder there is no gross deformity.  No ecchymosis or abrasions were seen.  Range of motion passively is minimally restricted.  Minimal crepitus is noted.  Skin is intact.  Motor or sensory function distal to the injury is intact. Neurologic:  Normal speech and language. No gross focal neurologic deficits are appreciated. No gait instability. Skin:  Skin is warm, dry and intact.  No ecchymosis, abrasions or erythema was  noted. Psychiatric: Mood and affect are normal. Speech and behavior are normal.  ____________________________________________   LABS (all labs ordered are listed, but only abnormal results are displayed)  Labs Reviewed - No data to display ____________________________________________ RADIOLOGY  ED MD interpretation:   Right shoulder is negative for fracture or dislocation.  Official radiology report(s): Dg Shoulder Right  Result Date: 05/11/2018 CLINICAL DATA:  Tripped and fell 3 days ago landing on the right shoulder. Right shoulder pain. Unable to lift arm up. EXAM: RIGHT SHOULDER - 2+ VIEW COMPARISON:  None. FINDINGS: No fracture or dislocation.  No bone lesion. Minor marginal spurring from the inferior humeral head. Glenohumeral joint otherwise well preserved. Mild AC joint osteoarthritis. Bones are demineralized. Soft tissues are unremarkable. IMPRESSION: No fracture or dislocation. Electronically Signed   By: Lajean Manes M.D.   On: 05/11/2018 11:18    ____________________________________________   PROCEDURES  Procedure(s) performed: None  Procedures  Critical Care performed: No  ____________________________________________   INITIAL IMPRESSION / ASSESSMENT AND PLAN / ED COURSE He was placed in a sling for support for the next 2 days.  He is encouraged to follow-up with his primary care doctor or the orthopedist listed on his discharge papers if any continued problems.  Encouraged use ice to his shoulder as needed for discomfort.  Was reassured that x-rays were negative for fracture dislocation.  ____________________________________________   FINAL CLINICAL IMPRESSION(S) / ED DIAGNOSES  Final diagnoses:  Contusion of right shoulder, initial encounter  Fall, initial encounter     ED Discharge Orders    None       Note:  This document was prepared using Dragon voice recognition software and may include unintentional dictation errors.    Johnn Hai, PA-C 05/11/18 1357    Delman Kitten, MD 05/11/18 [phone]

## 2018-05-11 NOTE — Discharge Instructions (Addendum)
Follow-up with the orthopedist listed on your discharge papers if any continued problems. Apply ice to the shoulder as needed for discomfort.  Wear sling for the next 2 days.  Slowly begin using range of motion exercises to keep your shoulder loose.  X-rays today did not show a fracture or dislocation.

## 2018-05-11 NOTE — ED Notes (Signed)
See triage note  States he tripped 3 days ago and landed on right shoulder  No deformity noted  Good pulses

## 2018-05-11 NOTE — ED Triage Notes (Addendum)
Pt tripped and fell landing on right shoulder.  Denies hitting head.  Is on coumadin, but levels controlled. Only pain is right shoulder and only when moves it. Denies pain sitting in wheel chair but if extend shoulder c/o pain.  No obvious deformity.  No significant bruising noted.

## 2018-05-18 DIAGNOSIS — M25511 Pain in right shoulder: Secondary | ICD-10-CM | POA: Diagnosis not present

## 2018-05-25 DIAGNOSIS — Z7901 Long term (current) use of anticoagulants: Secondary | ICD-10-CM | POA: Diagnosis not present

## 2018-06-11 DIAGNOSIS — R972 Elevated prostate specific antigen [PSA]: Secondary | ICD-10-CM | POA: Diagnosis not present

## 2018-06-11 DIAGNOSIS — R339 Retention of urine, unspecified: Secondary | ICD-10-CM | POA: Diagnosis not present

## 2018-06-11 DIAGNOSIS — N320 Bladder-neck obstruction: Secondary | ICD-10-CM | POA: Diagnosis not present

## 2018-06-11 DIAGNOSIS — N312 Flaccid neuropathic bladder, not elsewhere classified: Secondary | ICD-10-CM | POA: Diagnosis not present

## 2018-06-11 DIAGNOSIS — C61 Malignant neoplasm of prostate: Secondary | ICD-10-CM | POA: Diagnosis not present

## 2018-06-16 ENCOUNTER — Ambulatory Visit (INDEPENDENT_AMBULATORY_CARE_PROVIDER_SITE_OTHER): Payer: Medicare Other | Admitting: Podiatry

## 2018-06-16 ENCOUNTER — Encounter: Payer: Self-pay | Admitting: Podiatry

## 2018-06-16 DIAGNOSIS — E11621 Type 2 diabetes mellitus with foot ulcer: Secondary | ICD-10-CM | POA: Diagnosis not present

## 2018-06-16 DIAGNOSIS — E0843 Diabetes mellitus due to underlying condition with diabetic autonomic (poly)neuropathy: Secondary | ICD-10-CM | POA: Diagnosis not present

## 2018-06-16 DIAGNOSIS — N183 Chronic kidney disease, stage 3 (moderate): Secondary | ICD-10-CM | POA: Diagnosis not present

## 2018-06-16 DIAGNOSIS — E782 Mixed hyperlipidemia: Secondary | ICD-10-CM | POA: Diagnosis not present

## 2018-06-16 DIAGNOSIS — Z0000 Encounter for general adult medical examination without abnormal findings: Secondary | ICD-10-CM | POA: Diagnosis not present

## 2018-06-16 DIAGNOSIS — E1151 Type 2 diabetes mellitus with diabetic peripheral angiopathy without gangrene: Secondary | ICD-10-CM | POA: Diagnosis not present

## 2018-06-16 DIAGNOSIS — E1122 Type 2 diabetes mellitus with diabetic chronic kidney disease: Secondary | ICD-10-CM | POA: Diagnosis not present

## 2018-06-16 DIAGNOSIS — I7025 Atherosclerosis of native arteries of other extremities with ulceration: Secondary | ICD-10-CM

## 2018-06-16 DIAGNOSIS — M109 Gout, unspecified: Secondary | ICD-10-CM | POA: Diagnosis not present

## 2018-06-16 DIAGNOSIS — L97512 Non-pressure chronic ulcer of other part of right foot with fat layer exposed: Secondary | ICD-10-CM | POA: Diagnosis not present

## 2018-06-16 DIAGNOSIS — E1142 Type 2 diabetes mellitus with diabetic polyneuropathy: Secondary | ICD-10-CM | POA: Diagnosis not present

## 2018-06-16 DIAGNOSIS — Z1389 Encounter for screening for other disorder: Secondary | ICD-10-CM | POA: Diagnosis not present

## 2018-06-16 DIAGNOSIS — Z7901 Long term (current) use of anticoagulants: Secondary | ICD-10-CM | POA: Diagnosis not present

## 2018-06-16 DIAGNOSIS — Z7984 Long term (current) use of oral hypoglycemic drugs: Secondary | ICD-10-CM | POA: Diagnosis not present

## 2018-06-16 DIAGNOSIS — Z7189 Other specified counseling: Secondary | ICD-10-CM | POA: Diagnosis not present

## 2018-06-16 DIAGNOSIS — L97509 Non-pressure chronic ulcer of other part of unspecified foot with unspecified severity: Secondary | ICD-10-CM | POA: Diagnosis not present

## 2018-06-16 DIAGNOSIS — I739 Peripheral vascular disease, unspecified: Secondary | ICD-10-CM | POA: Diagnosis not present

## 2018-06-16 DIAGNOSIS — I129 Hypertensive chronic kidney disease with stage 1 through stage 4 chronic kidney disease, or unspecified chronic kidney disease: Secondary | ICD-10-CM | POA: Diagnosis not present

## 2018-06-16 MED ORDER — GENTAMICIN SULFATE 0.1 % EX CREA: 1.0000 "application " | TOPICAL_CREAM | Freq: Three times a day (TID) | CUTANEOUS | 1 refills | Status: DC

## 2018-06-22 DIAGNOSIS — Z7901 Long term (current) use of anticoagulants: Secondary | ICD-10-CM | POA: Diagnosis not present

## 2018-06-22 NOTE — Progress Notes (Signed)
   Subjective:  84 year old male presenting today with a chief complaint of an ulceration located on the sub-first MPJ of the right foot that appeared four days ago. He states he believes he scratched the foot on a piece of wood which initially caused the wound. He reports mild associated drainage from the area. He has been soaking the foot in Epsom salt and applying antibiotic ointment. He denies any pain. Patient is here for further evaluation and treatment.   Past Medical History:  Diagnosis Date  . Cancer ([place])   . Chronic kidney disease   . Colon polyp   . Diabetes mellitus   . History of blood clotting disorder       Objective/Physical Exam General: The patient is alert and oriented x3 in no acute distress.  Dermatology:  Wound #1 noted to the sub-first MPJ of the right foot measuring 2.0 x 2.0 x 0.1 cm (LxWxD).   To the noted ulceration(s), there is no eschar. There is a moderate amount of slough, fibrin, and necrotic tissue noted. Granulation tissue and wound base is red. There is a minimal amount of serosanguineous drainage noted. There is no exposed bone muscle-tendon ligament or joint. There is no malodor. Periwound integrity is intact. Skin is warm, dry and supple bilateral lower extremities.  Vascular: Palpable pedal pulses bilaterally. No edema or erythema noted. Capillary refill within normal limits.  Neurological: Epicritic and protective threshold diminished bilaterally.   Musculoskeletal Exam: Range of motion within normal limits to all pedal and ankle joints bilateral. Muscle strength 5/5 in all groups bilateral.   Assessment: #1 ulceration to the sub-first MPJ of the right foot secondary to diabetes mellitus #2 diabetes mellitus w/ peripheral neuropathy   Plan of Care:  #1 Patient was evaluated. #2 medically necessary excisional debridement including subcutaneous tissue was performed using a tissue nipper and a chisel blade. Excisional debridement of all the  necrotic nonviable tissue down to healthy bleeding viable tissue was performed with post-debridement measurements same as pre-. #3 the wound was cleansed and dry sterile dressing applied. #4 Prescription for gentamicin cream to be used daily with a bandage.  #5 Continue wearing DM shoes.  #6 patient is to return to clinic in 3 weeks.   Edrick Kins, DPM [facility]  Dr. Edrick Kins, [address]                                        [place], [place] [address]                Office [phone]  Fax [phone]

## 2018-06-25 ENCOUNTER — Encounter: Payer: Self-pay | Admitting: Podiatry

## 2018-06-25 ENCOUNTER — Ambulatory Visit (INDEPENDENT_AMBULATORY_CARE_PROVIDER_SITE_OTHER): Payer: Medicare Other | Admitting: Podiatry

## 2018-06-25 DIAGNOSIS — B351 Tinea unguium: Secondary | ICD-10-CM

## 2018-06-25 DIAGNOSIS — D689 Coagulation defect, unspecified: Secondary | ICD-10-CM | POA: Diagnosis not present

## 2018-06-25 DIAGNOSIS — M79675 Pain in left toe(s): Secondary | ICD-10-CM

## 2018-06-25 DIAGNOSIS — E1149 Type 2 diabetes mellitus with other diabetic neurological complication: Secondary | ICD-10-CM

## 2018-06-25 NOTE — Progress Notes (Signed)
Complaint:  Visit Type: Patient returns to my office for continued preventative foot care services. Complaint: Patient states" my nails have grown long and thick and become painful to walk and wear shoes" Patient has been diagnosed with DM with no foot complications. The patient presents for preventative foot care services. Patient is under treatment with Dr.  Amalia Hailey for ulcer right foot.  Podiatric Exam: Vascular: dorsalis pedis and posterior tibial pulses are palpable bilateral. Capillary return is immediate. Temperature gradient is WNL. Skin turgor WNL  Sensorium: Diminished  Semmes Weinstein monofilament test. Normal tactile sensation bilaterally. Nail Exam: Pt has thick disfigured discolored nails with subungual debris noted bilateral entire nail hallux through fifth toenails Ulcer Exam: There is no evidence of ulcer or pre-ulcerative changes or infection. Orthopedic Exam: Muscle tone and strength are WNL. No limitations in general ROM. No crepitus or effusions noted. Foot type and digits show no abnormalities. Bony prominences are unremarkable. Pes planus.  Skin: No Porokeratosis. No infection or ulcers  Diagnosis:  Onychomycosis, , Pain in right toe, pain in left toes  Treatment & Plan Procedures and Treatment: Consent by patient was obtained for treatment procedures. The patient understood the discussion of treatment and procedures well. All questions were answered thoroughly reviewed. Debridement of mycotic and hypertrophic toenails, 1 through 5 bilateral and clearing of subungual debris. No ulceration, no infection noted. ANB  Signed for  2019. Return Visit-Office Procedure: Patient instructed to return to the office for a follow up visit 10 weeks  for continued evaluation and treatment.    Gardiner Barefoot DPM

## 2018-07-14 ENCOUNTER — Encounter: Payer: Self-pay | Admitting: Podiatry

## 2018-07-14 ENCOUNTER — Ambulatory Visit (INDEPENDENT_AMBULATORY_CARE_PROVIDER_SITE_OTHER): Payer: Medicare Other | Admitting: Podiatry

## 2018-07-14 DIAGNOSIS — E0843 Diabetes mellitus due to underlying condition with diabetic autonomic (poly)neuropathy: Secondary | ICD-10-CM

## 2018-07-14 DIAGNOSIS — L97512 Non-pressure chronic ulcer of other part of right foot with fat layer exposed: Secondary | ICD-10-CM

## 2018-07-14 DIAGNOSIS — I7025 Atherosclerosis of native arteries of other extremities with ulceration: Secondary | ICD-10-CM

## 2018-07-19 NOTE — Progress Notes (Signed)
   Subjective:  84 year old male presenting today for follow up evaluation of an ulceration of the right foot. He states he is doing well and the wound is slowly healing. He reports some tenderness when touching the area. He has been applying gentamicin cream and using moleskin to cover the ulcer. Patient is here for further evaluation and treatment.   Past Medical History:  Diagnosis Date  . Cancer ([place])   . Chronic kidney disease   . Colon polyp   . Diabetes mellitus   . History of blood clotting disorder       Objective/Physical Exam General: The patient is alert and oriented x3 in no acute distress.  Dermatology:  Wound #1 noted to the sub-first MPJ of the right foot measuring 2.0 x 2.0 x 0.1 cm (LxWxD).   To the noted ulceration(s), there is no eschar. There is a moderate amount of slough, fibrin, and necrotic tissue noted. Granulation tissue and wound base is red. There is a minimal amount of serosanguineous drainage noted. There is no exposed bone muscle-tendon ligament or joint. There is no malodor. Periwound integrity is intact. Skin is warm, dry and supple bilateral lower extremities.  Vascular: Palpable pedal pulses bilaterally. No edema or erythema noted. Capillary refill within normal limits.  Neurological: Epicritic and protective threshold diminished bilaterally.   Musculoskeletal Exam: Range of motion within normal limits to all pedal and ankle joints bilateral. Muscle strength 5/5 in all groups bilateral.   Assessment: #1 ulceration of the sub-first MPJ of the right foot secondary to diabetes mellitus #2 diabetes mellitus w/ peripheral neuropathy   Plan of Care:  #1 Patient was evaluated. #2 medically necessary excisional debridement including subcutaneous tissue was performed using a tissue nipper and a chisel blade. Excisional debridement of all the necrotic nonviable tissue down to healthy bleeding viable tissue was performed with post-debridement measurements  same as pre-. #3 the wound was cleansed and dry sterile dressing applied. #4 Continue using gentamicin cream daily with a dry sterile dressing daily.  #5 Continue wearing DM shoes and insoles.  #6 patient is to return to clinic in 3 weeks.   Edrick Kins, DPM [facility]  Dr. Edrick Kins, [address]                                        [place], [place] [address]                Office [phone]  Fax [phone]

## 2018-07-20 DIAGNOSIS — Z7901 Long term (current) use of anticoagulants: Secondary | ICD-10-CM | POA: Diagnosis not present

## 2018-08-04 ENCOUNTER — Encounter: Payer: Self-pay | Admitting: Podiatry

## 2018-08-04 ENCOUNTER — Ambulatory Visit (INDEPENDENT_AMBULATORY_CARE_PROVIDER_SITE_OTHER): Payer: Medicare Other | Admitting: Podiatry

## 2018-08-04 DIAGNOSIS — I7025 Atherosclerosis of native arteries of other extremities with ulceration: Secondary | ICD-10-CM | POA: Diagnosis not present

## 2018-08-04 DIAGNOSIS — E0843 Diabetes mellitus due to underlying condition with diabetic autonomic (poly)neuropathy: Secondary | ICD-10-CM | POA: Diagnosis not present

## 2018-08-04 DIAGNOSIS — L97512 Non-pressure chronic ulcer of other part of right foot with fat layer exposed: Secondary | ICD-10-CM | POA: Diagnosis not present

## 2018-08-06 NOTE — Progress Notes (Signed)
   Subjective:  84 year old male presenting today for follow up evaluation of an ulceration of the right foot. He states the wound is improving. He denies any modifying factors or pain at this time. He has no new concerns. Patient is here for further evaluation and treatment.   Past Medical History:  Diagnosis Date  . Cancer ([place])   . Chronic kidney disease   . Colon polyp   . Diabetes mellitus   . History of blood clotting disorder       Objective/Physical Exam General: The patient is alert and oriented x3 in no acute distress.  Dermatology:  Wound #1 noted to the sub-first MPJ of the right foot measuring 1.6 x 1.8 x 0.2 cm (LxWxD).   To the noted ulceration(s), there is no eschar. There is a moderate amount of slough, fibrin, and necrotic tissue noted. Granulation tissue and wound base is red. There is a minimal amount of serosanguineous drainage noted. There is no exposed bone muscle-tendon ligament or joint. There is no malodor. Periwound integrity is intact. Skin is warm, dry and supple bilateral lower extremities.  Vascular: Palpable pedal pulses bilaterally. No edema or erythema noted. Capillary refill within normal limits.  Neurological: Epicritic and protective threshold diminished bilaterally.   Musculoskeletal Exam: Range of motion within normal limits to all pedal and ankle joints bilateral. Muscle strength 5/5 in all groups bilateral.      Assessment: #1 ulceration of the sub-first MPJ of the right foot secondary to diabetes mellitus #2 diabetes mellitus w/ peripheral neuropathy   Plan of Care:  #1 Patient was evaluated. #2 medically necessary excisional debridement including subcutaneous tissue was performed using a tissue nipper and a chisel blade. Excisional debridement of all the necrotic nonviable tissue down to healthy bleeding viable tissue was performed with post-debridement measurements same as pre-. #3 the wound was cleansed and dry sterile dressing  applied. #4 Continue using gentamicin cream daily with a dry sterile dressing daily.  #5 Continue wearing DM shoes and insoles.  #6 patient is to return to clinic in 3 weeks.   Edrick Kins, DPM [facility]  Dr. Edrick Kins, [place]                                        [place], [place] [address]                Office [phone]  Fax [phone]

## 2018-08-19 DIAGNOSIS — Z7901 Long term (current) use of anticoagulants: Secondary | ICD-10-CM | POA: Diagnosis not present

## 2018-08-25 ENCOUNTER — Encounter: Payer: Medicare Other | Admitting: Podiatry

## 2018-08-31 ENCOUNTER — Ambulatory Visit (INDEPENDENT_AMBULATORY_CARE_PROVIDER_SITE_OTHER): Payer: Medicare Other | Admitting: Podiatry

## 2018-08-31 ENCOUNTER — Encounter: Payer: Self-pay | Admitting: Podiatry

## 2018-08-31 DIAGNOSIS — D689 Coagulation defect, unspecified: Secondary | ICD-10-CM

## 2018-08-31 DIAGNOSIS — E0843 Diabetes mellitus due to underlying condition with diabetic autonomic (poly)neuropathy: Secondary | ICD-10-CM

## 2018-08-31 DIAGNOSIS — L97512 Non-pressure chronic ulcer of other part of right foot with fat layer exposed: Secondary | ICD-10-CM | POA: Diagnosis not present

## 2018-08-31 NOTE — Progress Notes (Signed)
This patient presents the office today for follow up evaluation of chronic ulcer under the big toe joint, right foot.  Patient was seen by Dr. Amalia Hailey 3 weeks ago. Patient states that the wound is improving today and is having no pain and discomfort.  He says he has been soaking his foot and bandaging his foot as recommended. He says he is doing good with no bleeding or ulcers are noted.  He presents the office today for continued evaluation and treatment of his chronic ulcer. Patient is diabetic.   General Appearance  Alert, conversant and in no acute stress.  Vascular  Dorsalis pedis and posterior tibial  pulses are palpable  bilaterally.  Capillary return is within normal limits  bilaterally. Temperature is within normal limits  bilaterally.  Neurologic  Senn-Weinstein monofilament wire test within normal limits  bilaterally. Muscle power within normal limits bilaterally.  Nails Thick disfigured discolored nails with subungual debris  from hallux to fifth toes bilaterally. No evidence of bacterial infection or drainage bilaterally.  Orthopedic  No limitations of motion  feet .  No crepitus or effusions noted.  No bony pathology or digital deformities noted.  Skin  normotropic skin with no porokeratosis noted bilaterally.  Ulcer noted under the big toe joint of the right foot, which measures 1.5 cm x 1.5 cm.  Normal granulation tissue is noted in the center of the ulcer.  No evidence of any redness, swelling or drainage.  No malodor noted.  Diabetic ulcer right foot  ROV.  Debridement of necrotic tissue at the site of the ulcer.  Neosporin and dry sterile dressing was applied.  Off weightbearing pad was applied to the diabetic insole in his right shoe.  He is to continue home soaks and bandaging and change the pad as needed.  Return to the clinic in 3 weeks for further evaluation and treatment   Gardiner Barefoot DPM

## 2018-09-01 NOTE — Progress Notes (Signed)
This encounter was created in error - please disregard.

## 2018-09-17 ENCOUNTER — Encounter: Payer: Self-pay | Admitting: Podiatry

## 2018-09-17 ENCOUNTER — Ambulatory Visit (INDEPENDENT_AMBULATORY_CARE_PROVIDER_SITE_OTHER): Payer: Medicare Other | Admitting: Podiatry

## 2018-09-17 DIAGNOSIS — D689 Coagulation defect, unspecified: Secondary | ICD-10-CM | POA: Diagnosis not present

## 2018-09-17 DIAGNOSIS — L97512 Non-pressure chronic ulcer of other part of right foot with fat layer exposed: Secondary | ICD-10-CM | POA: Diagnosis not present

## 2018-09-17 DIAGNOSIS — E0843 Diabetes mellitus due to underlying condition with diabetic autonomic (poly)neuropathy: Secondary | ICD-10-CM

## 2018-09-17 NOTE — Progress Notes (Signed)
This patient presents the office today for follow up evaluation of chronic ulcer under the big toe joint, right foot.  Patient was seen by myself 3 weeks ago. Patient states that the wound is improving today and is having no pain and discomfort.  He says he has been soaking his foot and bandaging his foot as recommended. He says he is doing good with no bleeding or ulcers are noted. He believes his ulcer is filling in.  He says he has been very active so he has been unable to rest his foot. He presents the office today for continued evaluation and treatment of his chronic ulcer. Patient is diabetic.   General Appearance  Alert, conversant and in no acute stress.  Vascular  Dorsalis pedis and posterior tibial  pulses are palpable  bilaterally.  Capillary return is within normal limits  bilaterally. Temperature is within normal limits  bilaterally.  Neurologic  Senn-Weinstein monofilament wire test within normal limits  bilaterally. Muscle power within normal limits bilaterally.  Nails Thick disfigured discolored nails with subungual debris  from hallux to fifth toes bilaterally. No evidence of bacterial infection or drainage bilaterally.  Orthopedic  No limitations of motion  feet .  No crepitus or effusions noted.  No bony pathology or digital deformities noted.  Skin  normotropic skin with no porokeratosis noted bilaterally.  Ulcer noted under the big toe joint of the right foot, which measures 1.5 cm x 1.5 cm.  Normal granulation tissue is noted in the center of the ulcer.  No evidence of any redness, swelling or drainage.  No malodor noted.  Diabetic ulcer right foot  ROV.  Debridement of necrotic tissue at the site of the ulcer.  Neosporin and dry sterile dressing was applied.  Off weightbearing pad was re-applied to the diabetic insole in his right shoe.  He is to continue home soaks and bandaging and change the pad as needed.  Return to the clinic in 3 weeks for further evaluation and  treatment   Gardiner Barefoot DPM

## 2018-10-08 ENCOUNTER — Encounter: Payer: Self-pay | Admitting: Podiatry

## 2018-10-08 ENCOUNTER — Ambulatory Visit (INDEPENDENT_AMBULATORY_CARE_PROVIDER_SITE_OTHER): Payer: Medicare Other | Admitting: Podiatry

## 2018-10-08 DIAGNOSIS — L97512 Non-pressure chronic ulcer of other part of right foot with fat layer exposed: Secondary | ICD-10-CM | POA: Diagnosis not present

## 2018-10-08 DIAGNOSIS — D689 Coagulation defect, unspecified: Secondary | ICD-10-CM | POA: Diagnosis not present

## 2018-10-08 DIAGNOSIS — M79675 Pain in left toe(s): Secondary | ICD-10-CM

## 2018-10-08 DIAGNOSIS — E0843 Diabetes mellitus due to underlying condition with diabetic autonomic (poly)neuropathy: Secondary | ICD-10-CM | POA: Diagnosis not present

## 2018-10-08 DIAGNOSIS — B351 Tinea unguium: Secondary | ICD-10-CM

## 2018-10-08 NOTE — Progress Notes (Signed)
This patient presents the office today for follow up evaluation of chronic ulcer under the big toe joint, right foot.  Patient was seen by myself 3 weeks ago. Patient states that the wound is improving today and is having no pain and discomfort.  He says he has stopped soaking his foot  But still bandages his ulcer as directed. He says he is doing good with no bleeding or drainage  noted. He believes his ulcer is filling in.   He presents the office today for continued evaluation and treatment of his chronic ulcer. Patient is diabetic. Patient also says his nails have grown thick and long.  He says the nails are painful walking and wearing his shoes.   General Appearance  Alert, conversant and in no acute stress.  Vascular  Dorsalis pedis and posterior tibial  pulses are palpable  bilaterally.  Capillary return is within normal limits  bilaterally. Temperature is within normal limits  bilaterally.  Neurologic  Senn-Weinstein monofilament wire test within normal limits  bilaterally. Muscle power within normal limits bilaterally.  Nails Thick disfigured discolored nails with subungual debris  from hallux to fifth toes bilaterally. No evidence of bacterial infection or drainage bilaterally.  Orthopedic  No limitations of motion  feet .  No crepitus or effusions noted.  No bony pathology or digital deformities noted.  Skin  normotropic skin with no porokeratosis noted bilaterally.  Ulcer noted under the big toe joint of the right foot, which measures 1.0 cm x 1.0  cm.  Normal granulation tissue is noted in the center of the ulcer.  No evidence of any redness, swelling or drainage.  No malodor noted.  Diabetic ulcer right foot  Onychomycosis  B/L  ROV.  Debridement of necrotic tissue at the site of the ulcer.  Neosporin and dry sterile dressing was applied.    He is to continue home soaks and bandaging and change the pad as needed.  Return to the clinic in 3 weeks for further evaluation and treatment.   Debridement of nails  X 10.   Gardiner Barefoot DPM

## 2018-10-19 ENCOUNTER — Other Ambulatory Visit: Payer: Self-pay | Admitting: Internal Medicine

## 2018-10-19 ENCOUNTER — Ambulatory Visit
Admission: RE | Admit: 2018-10-19 | Discharge: 2018-10-19 | Disposition: A | Discharge status: Home / Self Care | Payer: Medicare Other | Source: Ambulatory Visit | Attending: Internal Medicine | Admitting: Internal Medicine

## 2018-10-19 DIAGNOSIS — M25511 Pain in right shoulder: Secondary | ICD-10-CM

## 2018-10-19 DIAGNOSIS — Z7901 Long term (current) use of anticoagulants: Secondary | ICD-10-CM | POA: Diagnosis not present

## 2018-10-19 DIAGNOSIS — L97509 Non-pressure chronic ulcer of other part of unspecified foot with unspecified severity: Secondary | ICD-10-CM | POA: Diagnosis not present

## 2018-10-19 DIAGNOSIS — I739 Peripheral vascular disease, unspecified: Secondary | ICD-10-CM | POA: Diagnosis not present

## 2018-10-19 DIAGNOSIS — N183 Chronic kidney disease, stage 3 (moderate): Secondary | ICD-10-CM | POA: Diagnosis not present

## 2018-10-19 DIAGNOSIS — Z23 Encounter for immunization: Secondary | ICD-10-CM | POA: Diagnosis not present

## 2018-10-19 DIAGNOSIS — E1122 Type 2 diabetes mellitus with diabetic chronic kidney disease: Secondary | ICD-10-CM | POA: Diagnosis not present

## 2018-10-19 DIAGNOSIS — E11621 Type 2 diabetes mellitus with foot ulcer: Secondary | ICD-10-CM | POA: Diagnosis not present

## 2018-10-19 DIAGNOSIS — E1151 Type 2 diabetes mellitus with diabetic peripheral angiopathy without gangrene: Secondary | ICD-10-CM | POA: Diagnosis not present

## 2018-10-19 DIAGNOSIS — I129 Hypertensive chronic kidney disease with stage 1 through stage 4 chronic kidney disease, or unspecified chronic kidney disease: Secondary | ICD-10-CM | POA: Diagnosis not present

## 2018-10-19 DIAGNOSIS — E1142 Type 2 diabetes mellitus with diabetic polyneuropathy: Secondary | ICD-10-CM | POA: Diagnosis not present

## 2018-10-19 DIAGNOSIS — M109 Gout, unspecified: Secondary | ICD-10-CM | POA: Diagnosis not present

## 2018-10-29 ENCOUNTER — Ambulatory Visit (INDEPENDENT_AMBULATORY_CARE_PROVIDER_SITE_OTHER): Payer: Medicare Other | Admitting: Podiatry

## 2018-10-29 ENCOUNTER — Encounter: Payer: Self-pay | Admitting: Podiatry

## 2018-10-29 DIAGNOSIS — L97512 Non-pressure chronic ulcer of other part of right foot with fat layer exposed: Secondary | ICD-10-CM | POA: Diagnosis not present

## 2018-10-29 DIAGNOSIS — D689 Coagulation defect, unspecified: Secondary | ICD-10-CM

## 2018-10-29 DIAGNOSIS — E0843 Diabetes mellitus due to underlying condition with diabetic autonomic (poly)neuropathy: Secondary | ICD-10-CM

## 2018-10-29 NOTE — Progress Notes (Signed)
This patient presents the office today for follow up evaluation of chronic ulcer under the big toe joint, right foot.  Patient was seen by myself 3 weeks ago. Patient states that the wound is improving today and is having no pain and discomfort.   He  still bandages his ulcer as directed. He says he is doing good with no bleeding or drainage  noted. He believes his ulcer is filling in.   He presents the office today for continued evaluation and treatment of his chronic ulcer. Patient is diabetic. He presents to the office for continued evaluation and treatment of ulcer.   General Appearance  Alert, conversant and in no acute stress.  Vascular  Dorsalis pedis and posterior tibial  pulses are palpable  bilaterally.  Capillary return is within normal limits  bilaterally. Temperature is within normal limits  bilaterally.  Neurologic  Senn-Weinstein monofilament wire test within normal limits  bilaterally. Muscle power within normal limits bilaterally.  Nails Thick disfigured discolored nails with subungual debris  from hallux to fifth toes bilaterally. No evidence of bacterial infection or drainage bilaterally.  Orthopedic  No limitations of motion  feet .  No crepitus or effusions noted.  No bony pathology or digital deformities noted.  Skin  normotropic skin with no porokeratosis noted bilaterally.  Ulcer noted under the big toe joint of the right foot, which measures 1.0 cm x 1.0  cm.  Normal granulation tissue is noted in the center of the ulcer.  No evidence of any redness, swelling or drainage.  No malodor noted.  Diabetic ulcer right foot  Onychomycosis  B/L  ROV.  Debridement of necrotic tissue at the site of the ulcer.  Neosporin and dry sterile dressing was applied.    He is to continue home soaks and bandaging and change the pad as needed.  Return to the clinic in 3 weeks for further evaluation and treatment. Patient had additional padding applied to his diabetic insole and dispersion padding  to surgical shoe.  Surgical shoes was dispensed in an effort to change gait and allow the ulcer to be off loaded.   Gardiner Barefoot DPM

## 2018-11-09 ENCOUNTER — Ambulatory Visit (INDEPENDENT_AMBULATORY_CARE_PROVIDER_SITE_OTHER): Payer: Medicare Other | Admitting: Podiatry

## 2018-11-09 ENCOUNTER — Encounter: Payer: Self-pay | Admitting: Podiatry

## 2018-11-09 DIAGNOSIS — E0843 Diabetes mellitus due to underlying condition with diabetic autonomic (poly)neuropathy: Secondary | ICD-10-CM

## 2018-11-09 DIAGNOSIS — D689 Coagulation defect, unspecified: Secondary | ICD-10-CM | POA: Diagnosis not present

## 2018-11-09 DIAGNOSIS — L97512 Non-pressure chronic ulcer of other part of right foot with fat layer exposed: Secondary | ICD-10-CM | POA: Diagnosis not present

## 2018-11-09 NOTE — Progress Notes (Signed)
This patient presents the office today for follow up evaluation of chronic ulcer under the big toe joint, right foot.  Patient was seen by myself 2 weeks ago. Patient states that the wound is improving today and is having no pain and discomfort.   He  still bandages his ulcer as directed. He says he is doing good with no bleeding or drainage  noted. His sock is bloodied minimally.He believes his ulcer is filling in.   He presents the office today for continued evaluation and treatment of his chronic ulcer. Patient is diabetic.    General Appearance  Alert, conversant and in no acute stress.  Vascular  Dorsalis pedis and posterior tibial  pulses are palpable  bilaterally.  Capillary return is within normal limits  bilaterally. Temperature is within normal limits  bilaterally.  Neurologic  Senn-Weinstein monofilament wire test within normal limits  bilaterally. Muscle power within normal limits bilaterally.  Nails Thick disfigured discolored nails with subungual debris  from hallux to fifth toes bilaterally. No evidence of bacterial infection or drainage bilaterally.  Orthopedic  No limitations of motion  feet .  No crepitus or effusions noted.  No bony pathology or digital deformities noted.  Skin  normotropic skin with no porokeratosis noted bilaterally.  Ulcer noted under the big toe joint of the right foot, which measures 10 mm x 6 mm.  Normal granulation tissue is noted in the center of the ulcer.  No evidence of any redness, swelling or drainage.  No malodor noted.  Diabetic ulcer right foot    ROV.  Debridement of necrotic tissue at the site of the ulcer.  Neosporin and dry sterile dressing was applied.    He is to continue home soaks and bandaging and change the pad as needed.  Return to the clinic in 3 weeks for further evaluation and treatment. Patient had additional padding applied to his diabetic insole and dispersion padding to surgical shoe.  Surgical shoes was dispensed in an effort to  change gait and allow the ulcer to be off loaded.   Gardiner Barefoot DPM

## 2018-11-23 DIAGNOSIS — Z7901 Long term (current) use of anticoagulants: Secondary | ICD-10-CM | POA: Diagnosis not present

## 2018-11-28 ENCOUNTER — Inpatient Hospital Stay
Admission: EM | Admit: 2018-11-28 | Discharge: 2018-12-04 | DRG: 617 | Disposition: A | Discharge status: Home Health | Payer: Medicare Other | Attending: Specialist | Admitting: Specialist

## 2018-11-28 ENCOUNTER — Other Ambulatory Visit: Payer: Self-pay

## 2018-11-28 ENCOUNTER — Emergency Department: Payer: Medicare Other

## 2018-11-28 DIAGNOSIS — D6851 Activated protein C resistance: Secondary | ICD-10-CM | POA: Diagnosis not present

## 2018-11-28 DIAGNOSIS — L03115 Cellulitis of right lower limb: Secondary | ICD-10-CM | POA: Diagnosis present

## 2018-11-28 DIAGNOSIS — M109 Gout, unspecified: Secondary | ICD-10-CM | POA: Diagnosis present

## 2018-11-28 DIAGNOSIS — Z79899 Other long term (current) drug therapy: Secondary | ICD-10-CM

## 2018-11-28 DIAGNOSIS — M86171 Other acute osteomyelitis, right ankle and foot: Secondary | ICD-10-CM | POA: Diagnosis not present

## 2018-11-28 DIAGNOSIS — Z9862 Peripheral vascular angioplasty status: Secondary | ICD-10-CM | POA: Diagnosis not present

## 2018-11-28 DIAGNOSIS — Z86711 Personal history of pulmonary embolism: Secondary | ICD-10-CM | POA: Diagnosis not present

## 2018-11-28 DIAGNOSIS — I70235 Atherosclerosis of native arteries of right leg with ulceration of other part of foot: Secondary | ICD-10-CM | POA: Diagnosis not present

## 2018-11-28 DIAGNOSIS — I96 Gangrene, not elsewhere classified: Secondary | ICD-10-CM | POA: Diagnosis not present

## 2018-11-28 DIAGNOSIS — E1169 Type 2 diabetes mellitus with other specified complication: Principal | ICD-10-CM | POA: Diagnosis present

## 2018-11-28 DIAGNOSIS — N183 Chronic kidney disease, stage 3 (moderate): Secondary | ICD-10-CM | POA: Diagnosis present

## 2018-11-28 DIAGNOSIS — E1140 Type 2 diabetes mellitus with diabetic neuropathy, unspecified: Secondary | ICD-10-CM | POA: Diagnosis present

## 2018-11-28 DIAGNOSIS — L97519 Non-pressure chronic ulcer of other part of right foot with unspecified severity: Secondary | ICD-10-CM | POA: Diagnosis present

## 2018-11-28 DIAGNOSIS — L8990 Pressure ulcer of unspecified site, unspecified stage: Secondary | ICD-10-CM

## 2018-11-28 DIAGNOSIS — E119 Type 2 diabetes mellitus without complications: Secondary | ICD-10-CM | POA: Diagnosis not present

## 2018-11-28 DIAGNOSIS — I129 Hypertensive chronic kidney disease with stage 1 through stage 4 chronic kidney disease, or unspecified chronic kidney disease: Secondary | ICD-10-CM | POA: Diagnosis present

## 2018-11-28 DIAGNOSIS — F419 Anxiety disorder, unspecified: Secondary | ICD-10-CM | POA: Diagnosis present

## 2018-11-28 DIAGNOSIS — M869 Osteomyelitis, unspecified: Secondary | ICD-10-CM | POA: Diagnosis not present

## 2018-11-28 DIAGNOSIS — N189 Chronic kidney disease, unspecified: Secondary | ICD-10-CM | POA: Diagnosis not present

## 2018-11-28 DIAGNOSIS — E1152 Type 2 diabetes mellitus with diabetic peripheral angiopathy with gangrene: Secondary | ICD-10-CM | POA: Diagnosis present

## 2018-11-28 DIAGNOSIS — E1122 Type 2 diabetes mellitus with diabetic chronic kidney disease: Secondary | ICD-10-CM | POA: Diagnosis present

## 2018-11-28 DIAGNOSIS — E785 Hyperlipidemia, unspecified: Secondary | ICD-10-CM | POA: Diagnosis present

## 2018-11-28 DIAGNOSIS — E11628 Type 2 diabetes mellitus with other skin complications: Secondary | ICD-10-CM | POA: Diagnosis present

## 2018-11-28 DIAGNOSIS — B952 Enterococcus as the cause of diseases classified elsewhere: Secondary | ICD-10-CM | POA: Diagnosis present

## 2018-11-28 DIAGNOSIS — E11621 Type 2 diabetes mellitus with foot ulcer: Secondary | ICD-10-CM | POA: Diagnosis present

## 2018-11-28 DIAGNOSIS — M868X7 Other osteomyelitis, ankle and foot: Secondary | ICD-10-CM | POA: Diagnosis present

## 2018-11-28 DIAGNOSIS — R918 Other nonspecific abnormal finding of lung field: Secondary | ICD-10-CM | POA: Diagnosis not present

## 2018-11-28 DIAGNOSIS — Z8546 Personal history of malignant neoplasm of prostate: Secondary | ICD-10-CM | POA: Diagnosis not present

## 2018-11-28 DIAGNOSIS — Z66 Do not resuscitate: Secondary | ICD-10-CM | POA: Diagnosis present

## 2018-11-28 DIAGNOSIS — M7989 Other specified soft tissue disorders: Secondary | ICD-10-CM | POA: Diagnosis not present

## 2018-11-28 DIAGNOSIS — I4891 Unspecified atrial fibrillation: Secondary | ICD-10-CM | POA: Diagnosis not present

## 2018-11-28 DIAGNOSIS — Z7901 Long term (current) use of anticoagulants: Secondary | ICD-10-CM | POA: Diagnosis not present

## 2018-11-28 DIAGNOSIS — Z7984 Long term (current) use of oral hypoglycemic drugs: Secondary | ICD-10-CM | POA: Diagnosis not present

## 2018-11-28 DIAGNOSIS — I739 Peripheral vascular disease, unspecified: Secondary | ICD-10-CM | POA: Diagnosis not present

## 2018-11-28 DIAGNOSIS — L089 Local infection of the skin and subcutaneous tissue, unspecified: Secondary | ICD-10-CM | POA: Diagnosis not present

## 2018-11-28 DIAGNOSIS — Z452 Encounter for adjustment and management of vascular access device: Secondary | ICD-10-CM | POA: Diagnosis not present

## 2018-11-28 DIAGNOSIS — L97518 Non-pressure chronic ulcer of other part of right foot with other specified severity: Secondary | ICD-10-CM | POA: Diagnosis not present

## 2018-11-28 DIAGNOSIS — I10 Essential (primary) hypertension: Secondary | ICD-10-CM | POA: Diagnosis not present

## 2018-11-28 LAB — CBC
HCT: 42.4 % (ref 39.0–52.0)
Hemoglobin: 13.6 g/dL (ref 13.0–17.0)
MCH: 30.6 pg (ref 26.0–34.0)
MCHC: 32.1 g/dL (ref 30.0–36.0)
MCV: 95.3 fL (ref 80.0–100.0)
Platelets: 302 10*3/uL (ref 150–400)
RBC: 4.45 MIL/uL (ref 4.22–5.81)
RDW: 12.7 % (ref 11.5–15.5)
WBC: 12.9 10*3/uL — ABNORMAL HIGH (ref 4.0–10.5)
nRBC: 0 % (ref 0.0–0.2)

## 2018-11-28 LAB — PROTIME-INR
INR: 3.95
Prothrombin Time: 38 seconds — ABNORMAL HIGH (ref 11.4–15.2)

## 2018-11-28 LAB — BASIC METABOLIC PANEL
Anion gap: 8 (ref 5–15)
BUN: 39 mg/dL — ABNORMAL HIGH (ref 8–23)
CO2: 22 mmol/L (ref 22–32)
Calcium: 8.6 mg/dL — ABNORMAL LOW (ref 8.9–10.3)
Chloride: 106 mmol/L (ref 98–111)
Creatinine, Ser: 1.98 mg/dL — ABNORMAL HIGH (ref 0.61–1.24)
GFR calc Af Amer: 35 mL/min — ABNORMAL LOW (ref >60)
GFR calc non Af Amer: 30 mL/min — ABNORMAL LOW (ref >60)
Glucose, Bld: 200 mg/dL — ABNORMAL HIGH (ref 70–99)
Potassium: 4.8 mmol/L (ref 3.5–5.1)
Sodium: 136 mmol/L (ref 135–145)

## 2018-11-28 LAB — DIFFERENTIAL
Basophils Absolute: 0 10*3/uL (ref 0.0–0.1)
Basophils Relative: 0 %
Eosinophils Absolute: 0.4 10*3/uL (ref 0.0–0.5)
Eosinophils Relative: 3 %
Lymphocytes Relative: 16 %
Lymphs Abs: 2.1 10*3/uL (ref 0.7–4.0)
Monocytes Absolute: 1.2 10*3/uL — ABNORMAL HIGH (ref 0.1–1.0)
Monocytes Relative: 9 %
Neutro Abs: 9.3 10*3/uL — ABNORMAL HIGH (ref 1.7–7.7)
Neutrophils Relative %: 71 %

## 2018-11-28 LAB — APTT
aPTT: 58 seconds — ABNORMAL HIGH (ref 24–36)
aPTT: 64 seconds — ABNORMAL HIGH (ref 24–36)

## 2018-11-28 LAB — LACTIC ACID, PLASMA
Lactic Acid, Venous: 2.2 mmol/L (ref 0.5–1.9)
Lactic Acid, Venous: 2.6 mmol/L (ref 0.5–1.9)

## 2018-11-28 MED ORDER — ACETAMINOPHEN 325 MG PO TABS
650.0000 mg | ORAL_TABLET | Freq: Four times a day (QID) | ORAL | Status: DC | PRN
  Administered 2018-11-29 – 2018-12-01 (×5): 650 mg via ORAL
  Filled 2018-11-28 (×6): qty 2

## 2018-11-28 MED ORDER — VITAMIN D 25 MCG (1000 UNIT) PO TABS
2000.0000 [IU] | ORAL_TABLET | Freq: Every day | ORAL | Status: DC
  Administered 2018-11-30 – 2018-12-04 (×5): 2000 [IU] via ORAL
  Filled 2018-11-28 (×5): qty 2

## 2018-11-28 MED ORDER — ENOXAPARIN SODIUM 40 MG/0.4ML ~~LOC~~ SOLN: 40.0000 mg | SUBCUTANEOUS | Status: DC

## 2018-11-28 MED ORDER — ACETAMINOPHEN 650 MG RE SUPP: 650.0000 mg | Freq: Four times a day (QID) | RECTAL | Status: DC | PRN

## 2018-11-28 MED ORDER — PIPERACILLIN-TAZOBACTAM 3.375 G IVPB
3.3750 g | Freq: Three times a day (TID) | INTRAVENOUS | Status: DC
  Administered 2018-11-29 – 2018-12-02 (×8): 3.375 g via INTRAVENOUS
  Filled 2018-11-28 (×9): qty 50

## 2018-11-28 MED ORDER — SODIUM CHLORIDE 0.9 % IV SOLN
INTRAVENOUS | Status: DC
  Administered 2018-11-28 – 2018-11-30 (×2): via INTRAVENOUS

## 2018-11-28 MED ORDER — ATORVASTATIN CALCIUM 10 MG PO TABS
10.0000 mg | ORAL_TABLET | Freq: Every evening | ORAL | Status: DC
  Administered 2018-11-29 – 2018-12-03 (×5): 10 mg via ORAL
  Filled 2018-11-28 (×6): qty 1

## 2018-11-28 MED ORDER — HYDRALAZINE HCL 20 MG/ML IJ SOLN
10.0000 mg | Freq: Four times a day (QID) | INTRAMUSCULAR | Status: DC | PRN
  Administered 2018-11-28 – 2018-12-04 (×2): 10 mg via INTRAVENOUS
  Filled 2018-11-28 (×3): qty 1

## 2018-11-28 MED ORDER — ONDANSETRON HCL 4 MG PO TABS: 4.0000 mg | ORAL_TABLET | Freq: Four times a day (QID) | ORAL | Status: DC | PRN

## 2018-11-28 MED ORDER — SODIUM CHLORIDE 0.9 % IV SOLN
2.0000 g | Freq: Once | INTRAVENOUS | Status: AC
  Administered 2018-11-28: 2 g via INTRAVENOUS
  Filled 2018-11-28: qty 2

## 2018-11-28 MED ORDER — VANCOMYCIN HCL IN DEXTROSE 750-5 MG/150ML-% IV SOLN
750.0000 mg | Freq: Two times a day (BID) | INTRAVENOUS | Status: DC
  Administered 2018-11-28 – 2018-11-30 (×3): 750 mg via INTRAVENOUS
  Filled 2018-11-28 (×5): qty 150

## 2018-11-28 MED ORDER — METRONIDAZOLE IN NACL 5-0.79 MG/ML-% IV SOLN
500.0000 mg | Freq: Three times a day (TID) | INTRAVENOUS | Status: DC
  Administered 2018-11-28: 500 mg via INTRAVENOUS
  Filled 2018-11-28: qty 100

## 2018-11-28 MED ORDER — COLCHICINE 0.6 MG PO TABS
0.6000 mg | ORAL_TABLET | Freq: Every day | ORAL | Status: DC
  Administered 2018-11-30 – 2018-12-04 (×5): 0.6 mg via ORAL
  Filled 2018-11-28 (×5): qty 1

## 2018-11-28 MED ORDER — ONDANSETRON HCL 4 MG/2ML IJ SOLN: 4.0000 mg | Freq: Four times a day (QID) | INTRAMUSCULAR | Status: DC | PRN

## 2018-11-28 MED ORDER — VANCOMYCIN HCL IN DEXTROSE 1-5 GM/200ML-% IV SOLN
1000.0000 mg | Freq: Once | INTRAVENOUS | Status: AC
  Administered 2018-11-28: 1000 mg via INTRAVENOUS
  Filled 2018-11-28: qty 200

## 2018-11-28 MED ORDER — ADULT MULTIVITAMIN W/MINERALS CH
ORAL_TABLET | Freq: Every day | ORAL | Status: DC
  Administered 2018-11-30 – 2018-12-04 (×5): 1 via ORAL
  Filled 2018-11-28 (×5): qty 1

## 2018-11-28 MED ORDER — ALPRAZOLAM 0.25 MG PO TABS: 0.2500 mg | ORAL_TABLET | Freq: Every evening | ORAL | Status: DC | PRN

## 2018-11-28 MED ORDER — LISINOPRIL 5 MG PO TABS
5.0000 mg | ORAL_TABLET | Freq: Every day | ORAL | Status: DC
  Administered 2018-11-28 – 2018-12-01 (×4): 5 mg via ORAL
  Filled 2018-11-28 (×4): qty 1

## 2018-11-28 MED ORDER — FEBUXOSTAT 40 MG PO TABS
80.0000 mg | ORAL_TABLET | Freq: Every day | ORAL | Status: DC
  Administered 2018-11-30 – 2018-12-04 (×5): 80 mg via ORAL
  Filled 2018-11-28 (×6): qty 2

## 2018-11-28 NOTE — ED Notes (Signed)
Hospitalist notified of inc BP.

## 2018-11-28 NOTE — H&P (Signed)
[facility] [place] NAME: Richard [name]    MR#:  [id number]  DATE OF BIRTH:  10/29/1931  DATE OF ADMISSION:  11/28/2018  PRIMARY CARE PHYSICIAN: [name], MD   REQUESTING/REFERRING PHYSICIAN: Dr. Larae Grooms  CHIEF COMPLAINT:   Chief Complaint  Patient presents with  . Blood Sugar Problem  . Foot Pain    HISTORY OF PRESENT ILLNESS:  Richard [name]  is a 84 y.o. male with a known history of diabetes, chronic kidney disease, history of prostate cancer, history of pulmonary embolism, history of factor V Leiden mutation who presents to the hospital due to right foot/toe pain redness and swelling.  Patient says he had a ulcer on the bottom of his right toe which is being managed by podiatry as an outpatient and local wound care and it was improving but he had a fall several days ago but did not have any injury to his right foot.  His son had wrapped his right foot and toes at that time.  He had a caregiver come to his house today and she took off the bandage and noticed his toe was black in color and necrotic appearing and he was having some swelling going up his right foot and therefore told him to come to the ER for further evaluation.  Patient underwent x-ray of his right foot which was suggestive of possible osteomyelitis of the Distal first right metatarsal and proximal phalanx of the right great toe.  Hospitalist services were contacted for admission.  Patient denies any fevers, chills, nausea, vomiting, abdominal pain, chest pain shortness of breath or any other associated symptoms presently.  PAST MEDICAL HISTORY:   Past Medical History:  Diagnosis Date  . Cancer ([place])   . Chronic kidney disease   . Colon polyp   . Diabetes mellitus   . History of blood clotting disorder     PAST SURGICAL HISTORY:   Past Surgical History:  Procedure Laterality Date  . EYE SURGERY     CATARACTS  . HERNIA REPAIR    . SKIN CANCER EXCISION       SOCIAL HISTORY:   Social History   Tobacco Use  . Smoking status: Unknown If Ever Smoked  . Smokeless tobacco: Never Used  Substance Use Topics  . Alcohol use: No    FAMILY HISTORY:  History reviewed. No pertinent family history.  DRUG ALLERGIES:  No Known Allergies  REVIEW OF SYSTEMS:   Review of Systems  Constitutional: Negative for fever and weight loss.  HENT: Negative for congestion, nosebleeds and tinnitus.   Eyes: Negative for blurred vision, double vision and redness.  Respiratory: Negative for cough, hemoptysis and shortness of breath.   Cardiovascular: Negative for chest pain, orthopnea, leg swelling and PND.  Gastrointestinal: Negative for abdominal pain, diarrhea, melena, nausea and vomiting.  Genitourinary: Negative for dysuria, hematuria and urgency.  Musculoskeletal: Negative for falls and joint pain.  Neurological: Negative for dizziness, tingling, sensory change, focal weakness, seizures, weakness and headaches.  Endo/Heme/Allergies: Negative for polydipsia. Does not bruise/bleed easily.  Psychiatric/Behavioral: Negative for depression and memory loss. The patient is not nervous/anxious.     MEDICATIONS AT HOME:   Prior to Admission medications   Medication Sig Start Date End Date Taking? Authorizing Provider  warfarin (COUMADIN) 5 MG tablet Take 5 mg by mouth daily. Except MONDAYS and FRIDAYS.   Yes [provider]  Alpha-Lipoic Acid 200 MG CAPS Take by mouth.  [provider]  ALPRAZolam Duanne Moron) 0.25 MG tablet Take 0.25 mg by mouth at bedtime as needed.      [provider]  atorvastatin (LIPITOR) 10 MG tablet Take 10 mg by mouth daily.      [provider]  Calcium Carbonate (CALCIUM 600 PO) Take by mouth.      [provider]  Cholecalciferol (VITAMIN D) 2000 UNITS tablet Take 2,000 Units by mouth daily.      [provider]  CIALIS 5 MG tablet TK 1/2 T PO QD 06/16/17   [provider]  Coenzyme Q10 10 MG capsule Take 10 mg by mouth.    [provider]  colchicine 0.6 MG tablet Take 0.6 mg by mouth.    [provider]  Febuxostat (ULORIC) 80 MG TABS Take by mouth.      [provider]  gentamicin cream (GARAMYCIN) 0.1 % Apply 1 application topically 3 (three) times daily. 06/16/18   Edrick Kins, DPM  glimepiride (AMARYL) 2 MG tablet Take 2 mg by mouth daily before breakfast.      [provider]  glimepiride (AMARYL) 4 MG tablet TK 1 T PO QD WITH BRE OR THE FIRST MAIN MEAL OF THE DAY 06/15/17   [provider]  ibandronate (BONIVA) 150 MG tablet Take 150 mg by mouth every 30 (thirty) days. Take in the morning with a full glass of water, on an empty stomach, and do not take anything else by mouth or lie down for the next 30 min.     [provider]  Ibuprofen (ADVIL PO) Take by mouth.      [provider]  lisinopril (PRINIVIL,ZESTRIL) 5 MG tablet Take 5 mg by mouth daily.      [provider]  Multiple Vitamins-Minerals (MULTIVITAMIN ADULT EXTRA C PO) Take by mouth.    [provider]  Multiple Vitamins-Minerals (MULTIVITAMIN ADULT PO) Take by mouth.    [provider]  nystatin-triamcinolone (MYCOLOG II) cream Apply to affected areas bid 05/08/16   [provider]  Red Yeast Rice 600 MG TABS Take by mouth.    [provider]  tadalafil (CIALIS) 5 MG tablet Take 5 mg by mouth. 05/21/17   [provider]  tadalafil (CIALIS) 5 MG tablet TK 1/2 T PO QD 09/16/17   [provider]  Vitamins A & D (VITAMIN A & D) [address] UNITS TABS Take by mouth.      [provider]      VITAL SIGNS:  Blood pressure (!) 154/83, pulse 71, temperature (!) 97.4 F (36.3 C), temperature source Oral, resp. rate 18, height 6' (1.829 m), weight 95.3 kg, SpO2 99 %.  PHYSICAL EXAMINATION:  Physical Exam  GENERAL:  84 y.o.-year-old patient lying in the bed in no acute  distress.  EYES: Pupils equal, round, reactive to light and accommodation. No scleral icterus. Extraocular muscles intact.  HEENT: Head atraumatic, normocephalic. Oropharynx and nasopharynx clear. No oropharyngeal erythema, moist oral mucosa  NECK:  Supple, no jugular venous distention. No thyroid enlargement, no tenderness.  LUNGS: Normal breath sounds bilaterally, no wheezing, rales, rhonchi. No use of accessory muscles of respiration.  CARDIOVASCULAR: S1, S2 RRR. No murmurs, rubs, gallops, clicks.  ABDOMEN: Soft, nontender, nondistended. Bowel sounds present. No organomegaly or mass.  EXTREMITIES: No pedal edema, cyanosis, or clubbing. + 2 pedal & radial pulses b/l.  Necrotic appearing right great toe with some surrounding erythema.  No acute drainage noted.  Some proximal swelling and warmth noted. NEUROLOGIC: Cranial nerves II through XII are intact. No focal Motor or sensory deficits appreciated b/l PSYCHIATRIC: The patient is alert and oriented x 3.  SKIN: No obvious rash, lesion, or ulcer.       LABORATORY PANEL:   CBC Recent Labs  Lab 11/28/18 1502  WBC 12.9*  HGB 13.6  HCT 42.4  PLT 302   ------------------------------------------------------------------------------------------------------------------  Chemistries  Recent Labs  Lab 11/28/18 1502  NA 136  K 4.8  CL 106  CO2 22  GLUCOSE 200*  BUN 39*  CREATININE 1.98*  CALCIUM 8.6*   ------------------------------------------------------------------------------------------------------------------  Cardiac Enzymes No results for input(s): TROPONINI in the last 168 hours. ------------------------------------------------------------------------------------------------------------------  RADIOLOGY:  Dg Foot Complete Right  Result Date: 11/28/2018 CLINICAL DATA:  Diabetic patient with a black great toe. EXAM: RIGHT FOOT COMPLETE - 3+ VIEW COMPARISON:  Plain films of the feet 06/14/2013. FINDINGS: Extensive bony  destructive change is seen centered about the first MTP joint consistent with septic joint and osteomyelitis. No other plain film evidence of osteomyelitis is seen. Soft tissues about the first MTP joint and great toe are markedly swollen. Extensive atherosclerosis is identified. 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 Severe soft tissue swelling about the toe. Atherosclerosis. Electronically Signed   By: Inge Rise M.D.   On: 11/28/2018 15:53     IMPRESSION AND PLAN:   84 year old male with past medical history of diabetes, hypertension, chronic kidney disease, prostate cancer who presents to the hospital due to right foot pain and swelling and also a necrotic appearing right great toe.  1.  Right foot/great toe osteomyelitis/necrosis- patient presented to the hospital with a significant discoloration and pain of his right foot.  This is specifically on his right great toe. - X-ray of the right foot showing evidence of distal first metatarsal and proximal phalanx of the great toe osteomyelitis. - We will empirically start the patient on IV vancomycin, Zosyn.  I will get an MRI of the right foot to further take a closer look. - We will get podiatry consult.  2.  Diabetes type 2 with neuropathy- will place on sliding scale insulin, follow blood sugars.  Hold glimepiride for now.  3.  Essential hypertension-continue lisinopril.  4.  History of gout-no acute attack.  Continue Uloric.  5.  Hyperlipidemia-continue atorvastatin.  6.  Anxiety-continue Xanax.  7.  History of recurrent pulmonary embolism due to factor V Leiden mutation-continue Coumadin.    All the records are reviewed and case discussed with ED provider. Management plans discussed with the patient, family and they are in agreement.  CODE STATUS: Full code  TOTAL TIME TAKING CARE OF THIS  PATIENT: 45 minutes.    Henreitta Leber M.D on 11/28/2018 at 4:44 PM  Between 7am to 6pm - Pager - [phone]  After 6pm go to [email or web address] - password [email or web address] [facility]  [facility]  Office  [phone]  CC: Primary care physician; [name], MD

## 2018-11-28 NOTE — Progress Notes (Signed)
CRITICAL VALUE ALERT  Critical Value: Latic Acid 2.6  Date & Time Notied:  11/28/18 2020  Provider Notified: on call MD  Orders Received/Actions taken: start Iv NS at 36ml/hr for 10hrs, repeat lab at 11/29/18 00:00

## 2018-11-28 NOTE — ED Notes (Signed)
Pt given warm blankets.

## 2018-11-28 NOTE — ED Notes (Signed)
IV attempted L arm x1.

## 2018-11-28 NOTE — ED Notes (Signed)
CBC/BMP sent from triage.

## 2018-11-28 NOTE — ED Notes (Signed)
L hand skin tear.

## 2018-11-28 NOTE — ED Triage Notes (Signed)
Type 2 DM. Has R great toe is black and bleeding. States son wrapped toe too tight. Odor present as well.

## 2018-11-28 NOTE — Progress Notes (Signed)
Pharmacy Antibiotic Note  Richard [name] is a 84 y.o. male admitted on 11/28/2018 with sepsis. Patient with chronic wounds on right foot with X-ray showing osteomyelitis. Pharmacy has been consulted for vancomycin and Zosyn dosing. The patient received dose of cefepime and Flagyl in the ED.  Plan: Vancomycin 750 mg IV q12h to start tonight at 2300. Goal trough 15-20 mcg/ml. Trough ordered 12/16 at 1000. ke 0.032, t1/2 21.7 hr, VD 59.3 L  Zosyn 3.375 g IV q8h (extended infusion) to start 12/15 at 0000  Height: 6' (182.9 cm) Weight: 210 lb (95.3 kg) IBW/kg (Calculated) : 77.6  Temp (24hrs), Avg:97.4 F (36.3 C), Min:97.4 F (36.3 C), Max:97.4 F (36.3 C)  Recent Labs  Lab 11/28/18 1502  WBC 12.9*  CREATININE 1.98*    Estimated Creatinine Clearance: 32.7 mL/min (A) (by C-G formula based on SCr of 1.98 mg/dL (H)).    No Known Allergies  Antimicrobials this admission: Cefepime 12/14 x 1 Flagyl 12/14 x 1 Vancomycin 12/14 >> Zosyn 12/15 >>  Dose adjustments this admission: NA  Microbiology results: 12/14 BCx: pending  Thank you for allowing pharmacy to be a part of this patient's care.  Tawnya Crook, PharmD Pharmacy Resident  11/28/2018 5:11 PM

## 2018-11-28 NOTE — ED Notes (Signed)
1 full culture set sent along with 2nd blue top. Not enough blood return to collect red top in 2nd culture set.

## 2018-11-28 NOTE — Progress Notes (Signed)
CODE SEPSIS - PHARMACY COMMUNICATION  **Broad Spectrum Antibiotics should be administered within 1 hour of Sepsis diagnosis**  Time Code Sepsis Called/Page Received: 1519  Antibiotics Ordered: vanc/cefepime/Flagyl  Time of 1st antibiotic administration: 1624  Additional action taken by pharmacy: called with 30 minutes left on 1 hour timeframe  If necessary, Name of Provider/Nurse Contacted: [place] Hahle, RN   Tawnya Crook, PharmD Pharmacy Resident  11/28/2018 4:29 PM

## 2018-11-28 NOTE — ED Notes (Addendum)
Pulse 1+ bilat. Additional warmth to R foot. Swelling noted from ankle down. Red/purple in color. R foot's great toe red/black and open. Open wound on bottom of foot yellow/white. Pt states "I have neuropathy."

## 2018-11-28 NOTE — Consult Note (Signed)
[facility]iat[facility]                                                      Patient Demographics  Richard [name], is a 84 y.o. male   MRN: [id number]   DOB - 1931/09/23  Admit Date - 11/28/2018    Outpatient Primary MD for the patient is [name], MD  Consult requested in the Hospital by Henreitta Leber, MD, On 11/28/2018    Reason for consult gangrene right great toe   With History of -  Past Medical History:  Diagnosis Date  . Cancer ([place])   . Chronic kidney disease   . Colon polyp   . Diabetes mellitus   . History of blood clotting disorder       Past Surgical History:  Procedure Laterality Date  . EYE SURGERY     CATARACTS  . HERNIA REPAIR    . SKIN CANCER EXCISION      in for   Chief Complaint  Patient presents with  . Blood Sugar Problem  . Foot Pain     HPI  Richard [name]  is a 84 y.o. male, patient has a history of diabetic ulceration underneath the big toe area started on the ball the foot of the right foot for over 6 months.  [facility] duration of [facility].  He states is not sure exactly what happened with this but he states that the toe is black now and he feels like he does start a few days ago but he is not a great historian with this.  Presented to the ER today with black and gangrenous changes to the digit and increasing drainage from the wound on the plantar aspect of his right foot    Review of Systems he is alert well oriented and pleasant  In addition to the HPI above,  No Fever-chills, No Headache, No changes with Vision or hearing, No problems swallowing food or Liquids, No Chest pain, Cough or Shortness of Breath, No Abdominal pain, No Nausea or Vommitting, Bowel movements are regular, No Blood in stool or Urine, No dysuria, Gangrenous changes to the right great toe No new  joints pains-aches,  No new weakness, tingling, numbness in any extremity, No recent weight gain or loss, No polyuria, polydypsia or polyphagia, No significant Mental Stressors.  A full 10 point Review of Systems was done, except as stated above, all other Review of Systems were negative.   Social History Social History   Tobacco Use  . Smoking status: Unknown If Ever Smoked  . Smokeless tobacco: Never Used  Substance Use Topics  . Alcohol use: No    Family History History reviewed. No pertinent family history.  Prior to Admission medications   Medication Sig Start Date End Date Taking? Authorizing Provider  Febuxostat (ULORIC) 80 MG TABS Take 1 tablet by mouth daily.    Yes [provider]  glimepiride (AMARYL) 4 MG tablet Take 4 mg by mouth daily with breakfast.  06/15/17  Yes [provider]  lisinopril (PRINIVIL,ZESTRIL) 5 MG tablet Take 5 mg by mouth daily.     Yes [provider]  Red Yeast Rice 600 MG TABS Take 1 tablet by mouth daily.    Yes [provider]  tadalafil (CIALIS) 5 MG  tablet Take 5 mg by mouth every other day.  05/21/17  Yes [provider]  warfarin (COUMADIN) 5 MG tablet Take 5 mg by mouth daily. Except MONDAYS and FRIDAYS.   Yes [provider]  warfarin (COUMADIN) 7.5 MG tablet Take 7.5 mg by mouth. On MONDAYS and FRIDAYS   Yes [provider]  Alpha-Lipoic Acid 200 MG CAPS Take by mouth.    [provider]  ALPRAZolam Duanne Moron) 0.25 MG tablet Take 0.25 mg by mouth at bedtime as needed.      [provider]  atorvastatin (LIPITOR) 10 MG tablet Take 10 mg by mouth daily.      [provider]  Calcium Carbonate (CALCIUM 600 PO) Take by mouth.      [provider]  Cholecalciferol (VITAMIN D) 2000 UNITS tablet Take 2,000 Units by mouth daily.      [provider]  CIALIS 5 MG tablet TK 1/2 T PO QD 06/16/17   [provider]  Coenzyme Q10 10 MG  capsule Take 10 mg by mouth.    [provider]  colchicine 0.6 MG tablet Take 0.6 mg by mouth.    [provider]  gentamicin cream (GARAMYCIN) 0.1 % Apply 1 application topically 3 (three) times daily. Patient not taking: Reported on 11/28/2018 06/16/18   Edrick Kins, DPM  glimepiride (AMARYL) 2 MG tablet Take 2 mg by mouth daily before breakfast.      [provider]  ibandronate (BONIVA) 150 MG tablet Take 150 mg by mouth every 30 (thirty) days. Take in the morning with a full glass of water, on an empty stomach, and do not take anything else by mouth or lie down for the next 30 min.     [provider]  Ibuprofen (ADVIL PO) Take by mouth.      [provider]  Multiple Vitamins-Minerals (MULTIVITAMIN ADULT EXTRA C PO) Take by mouth.    [provider]  Multiple Vitamins-Minerals (MULTIVITAMIN ADULT PO) Take by mouth.    [provider]  nystatin-triamcinolone (MYCOLOG II) cream Apply to affected areas bid 05/08/16   [provider]  tadalafil (CIALIS) 5 MG tablet TK 1/2 T PO QD 09/16/17   [provider]  Vitamins A & D (VITAMIN A & D) [address] UNITS TABS Take by mouth.      [provider]    Anti-infectives (From admission, onward)   Start     Dose/Rate Route Frequency Ordered Stop   11/29/18 2200  piperacillin-tazobactam (ZOSYN) IVPB 3.375 g     3.375 g 12.5 mL/hr over 240 Minutes Intravenous Every 8 hours 11/28/18 1727     11/28/18 2300  vancomycin (VANCOCIN) IVPB 750 mg/150 ml premix     750 mg 150 mL/hr over 60 Minutes Intravenous Every 12 hours 11/28/18 1727     11/28/18 1530  ceFEPIme (MAXIPIME) 2 g in sodium chloride 0.9 % 100 mL IVPB     2 g 200 mL/hr over 30 Minutes Intravenous  Once 11/28/18 1519 11/28/18 1658   11/28/18 1530  metroNIDAZOLE (FLAGYL) IVPB 500 mg  Status:  Discontinued     500 mg 100 mL/hr over 60 Minutes Intravenous Every 8 hours 11/28/18 1519 11/28/18 1727    11/28/18 1530  vancomycin (VANCOCIN) IVPB 1000 mg/200 mL premix     1,000 mg 200 mL/hr over 60 Minutes Intravenous  Once 11/28/18 1519 11/28/18 1801      Scheduled Meds: . atorvastatin  10 mg Oral  QPM  . [START ON 11/29/2018] cholecalciferol  2,000 Units Oral Daily  . [START ON 11/29/2018] colchicine  0.6 mg Oral Daily  . [START ON 11/29/2018] febuxostat  80 mg Oral Daily  . lisinopril  5 mg Oral Daily  . [START ON 11/29/2018] multivitamin with minerals   Oral Daily   Continuous Infusions: . [START ON 11/29/2018] piperacillin-tazobactam (ZOSYN)  IV    . vancomycin     PRN Meds:.acetaminophen **OR** acetaminophen, ALPRAZolam, hydrALAZINE, ondansetron **OR** ondansetron (ZOFRAN) IV  No Known Allergies  Physical Exam  Vitals  Blood pressure (!) 166/85, pulse 71, temperature (!) 97.4 F (36.3 C), temperature source Oral, resp. rate 18, height 6' (1.829 m), weight 95.3 kg, SpO2 100 %.  Lower Extremity exam:  Vascular: Difficult to palpate bilateral  Dermatological: Patient has an ulcer sub-met one on the right foot is about 1 cm 1.5 cm in diameter.  Probes all the way down to the bone and joint the blunt probe the utilized really went all the way into the first metatarsal phalangeal joint with probing.  Culture tube was also probed that deep in order to get the culture I did today.  Neurological: Diabetic peripheral neuropathy  Ortho: Patient has gangrene to the right hallux distal two thirds of the toe was black and avascular.  X-rays today show a complete collapse of the metatarsal head and base proximal phalanx consistent with osteomyelitis and/or Charcot arthropathy.  I think with the underlying ulceration region osteomyelitis is at high risk and he may have had a fall that could have contributed to fractures and that first metatarsal consistent with Charcot fractures.  Does not seem to have excessive erythema or cellulitis but the drainage from that wound is noted.  Data  Review  CBC Recent Labs  Lab 11/28/18 1502  WBC 12.9*  HGB 13.6  HCT 42.4  PLT 302  MCV 95.3  MCH 30.6  MCHC 32.1  RDW 12.7  LYMPHSABS 2.1  MONOABS 1.2*  EOSABS 0.4  BASOSABS 0.0   ------------------------------------------------------------------------------------------------------------------  Chemistries  Recent Labs  Lab 11/28/18 1502  NA 136  K 4.8  CL 106  CO2 22  GLUCOSE 200*  BUN 39*  CREATININE 1.98*  CALCIUM 8.6*   ------------------------------------------------------------------------------------------------------------------ estimated creatinine clearance is 32.7 mL/min (A) (by C-G formula based on SCr of 1.98 mg/dL (H)). ------------------------------------------------------------------------------------------------------------------ No results for input(s): TSH, T4TOTAL, T3FREE, THYROIDAB in the last 72 hours.  Invalid input(s): FREET3 Urinalysis No results found for: COLORURINE, APPEARANCEUR, [place], [place], GLUCOSEU, HGBUR, BILIRUBINUR, KETONESUR, PROTEINUR, UROBILINOGEN, NITRITE, LEUKOCYTESUR   Imaging results:   Dg Foot Complete Right  Result Date: 11/28/2018 CLINICAL DATA:  Diabetic patient with a black great toe. EXAM: RIGHT FOOT COMPLETE - 3+ VIEW COMPARISON:  Plain films of the feet 06/14/2013. FINDINGS: Extensive bony destructive change is seen centered about the first MTP joint consistent with septic joint and osteomyelitis. No other plain film evidence of osteomyelitis is seen. Soft tissues about the first MTP joint and great toe are markedly swollen. Extensive atherosclerosis is identified. 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 Severe soft tissue swelling about the toe. Atherosclerosis. Electronically Signed   By: Inge Rise M.D.   On: 11/28/2018 15:53    Assessment & Plan: We will get an MRI on  the patient to eval check that out tomorrow morning I expected to have significant increased signal throughout the first ray.  He is also factor V patient and his own Coumadin right now.spoke with the hospitalist Dr. Verdell Carmine and he Allen his Coumadin and put him on a heparin drip which will stop tomorrow prior to his surgery.  Put him on schedule for tomorrow for first ray amputation to the right first ray.  I explained to him the necessity of doing this and he seems to completely understand and is in agreement with it.  Stands that he is at a high risk of losing his foot or even with higher amputation if he does not eliminate the gangrene and increased risk of infection that he has now.  I explained the consent form to him he understands accepts the risk of possible complications of surgery.  We plan on proceeding with a first ray amputation tomorrow morning.  Active Problems:   Acute osteomyelitis of toe of right foot [facility])   Family Communication: Plan discussed with patient and his son  Richard [name] M.D on 11/28/2018 at 6:58 PM  Thank you for the consult, we will follow the patient with you in the Hospital.

## 2018-11-28 NOTE — Consult Note (Addendum)
ANTICOAGULATION CONSULT NOTE - Initial Consult  Pharmacy Consult for heparin drip  Indication: VTE prophylaxis  No Known Allergies  Patient Measurements: Height: 6' (182.9 cm) Weight: 210 lb (95.3 kg) IBW/kg (Calculated) : 77.6  Vital Signs: Temp: 97.4 F (36.3 C) (12/14 1457) Temp Source: Oral (12/14 1457) BP: 166/85 (12/14 1839) Pulse Rate: 71 (12/14 1700)  Labs: Recent Labs    11/28/18 1502 11/28/18 1921  HGB 13.6  --   HCT 42.4  --   PLT 302  --   APTT 58* 64*  LABPROT 38.0*  --   INR 3.95  --   CREATININE 1.98*  --     Estimated Creatinine Clearance: 32.7 mL/min (A) (by C-G formula based on SCr of 1.98 mg/dL (H)).   Medical History: Past Medical History:  Diagnosis Date  . Cancer ([place])   . Chronic kidney disease   . Colon polyp   . Diabetes mellitus   . History of blood clotting disorder     Medications:  Warfarin   Assessment: Pharmacy consulted for heparin drip dosing and monitoring for 84 yo male with PMH of PE and factor V Leiden mutation. Patient is admitted with gangrene of right great toe. Planned first ray amputation surgery 12/14.  INR supratherapeutic on admission @ 3.95.  Goal of Therapy:  INR 2-3 Heparin level 0.3-0.7 units/ml Monitor platelets by anticoagulation protocol: Yes   Plan:  12/14 INR supratherapeutic. Hold heparin drip until INR closer to 2. INR ordered with AM labs. Plan discussed with  Dr. Elvina Mattes. Surgery planned for 12/15.    Heparin order plan once INR 2-3: Give 5000 units bolus x 1 Start heparin infusion at 1700 units/hr Check anti-Xa level 6 hours after heparin initiation and daily while on heparin Continue to monitor H&H and platelets  Pernell Dupre, PharmD, BCPS Clinical Pharmacist 11/28/2018 9:25 PM

## 2018-11-28 NOTE — Plan of Care (Signed)

## 2018-11-28 NOTE — ED Notes (Signed)
EKG given to [place] in person.

## 2018-11-28 NOTE — Anesthesia Preprocedure Evaluation (Addendum)
Anesthesia Evaluation  Patient identified by MRN, date of birth, ID band Patient awake    Reviewed: Allergy & Precautions, H&P , NPO status , Patient's Chart, lab work & pertinent test results, reviewed documented beta blocker date and time   History of Anesthesia Complications Negative for: history of anesthetic complications  Airway Mallampati: III  TM Distance: >3 FB Neck ROM: full    Dental  (+) Edentulous Upper, Edentulous Lower, Dental Advidsory Given   Pulmonary neg pulmonary ROS,           Cardiovascular Exercise Tolerance: Good negative cardio ROS       Neuro/Psych neg Seizures  Neuromuscular disease negative psych ROS   GI/Hepatic negative GI ROS, Neg liver ROS,   Endo/Other  diabetes  Renal/GU CRFRenal disease  negative genitourinary   Musculoskeletal   Abdominal   Peds  Hematology  (+) Blood dyscrasia, , Factor V leiden   Anesthesia Other Findings Past Medical History: No date: Cancer ([place]) No date: Chronic kidney disease No date: Colon polyp No date: Diabetes mellitus No date: History of blood clotting disorder   Reproductive/Obstetrics negative OB ROS                            Anesthesia Physical Anesthesia Plan  ASA: III  Anesthesia Plan: General   Post-op Pain Management:    Induction: Intravenous  PONV Risk Score and Plan: 2 and Ondansetron, Dexamethasone and Treatment may vary due to age or medical condition  Airway Management Planned: LMA  Additional Equipment:   Intra-op Plan:   Post-operative Plan: Extubation in OR  Informed Consent: I have reviewed the patients History and Physical, chart, labs and discussed the procedure including the risks, benefits and alternatives for the proposed anesthesia with the patient or authorized representative who has indicated his/her understanding and acceptance.   Dental Advisory Given  Plan Discussed with:  Anesthesiologist, CRNA and Surgeon  Anesthesia Plan Comments:         Anesthesia Quick Evaluation

## 2018-11-28 NOTE — ED Provider Notes (Signed)
[facility] Emergency Department Provider Note  ____________________________________________   First MD Initiated Contact with Patient 11/28/18 1500     (approximate)  I have reviewed the triage vital signs and the nursing notes.   HISTORY  Chief Complaint Blood Sugar Problem and Foot Pain   HPI Richard [name] is a 84 y.o. male with a history of diabetes as well as chronic kidney disease who is presenting with a black and foul-smelling right great toe.  He says that he was walking into a store several days ago when he had a mechanical fall.  He states that he was reaching for a post and misjudged the distance of the postman fell onto his left shoulder.  He sustained a contusion to his left shoulder as well as a skin tear to the left hand, dorsum.  He said that he was unaware of any injury to the right foot or toes at that time.  However, he states that his son had wrapped his right great toe very tightly and that his caregiver just took the bandage off today revealing a swollen and black great toe to the right side.  Patient denies hitting his head during the fall several days ago.  Patient states that he is up-to-date with his tetanus shot.   Past Medical History:  Diagnosis Date  . Cancer ([place])   . Chronic kidney disease   . Colon polyp   . Diabetes mellitus   . History of blood clotting disorder     Patient Active Problem List   Diagnosis Date Noted  . Malignant tumor of prostate ([place]) 10/26/2015  . Urinary tract infection with hematuria 01/11/2015  . Bladder neck obstruction 11/04/2012  . 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 Prostate cancer ([place]) 11/04/2012  . Retention of urine 05/06/2012  . History of hematuria 01/30/2012  . Malignant neoplasm of prostate ([place]) 09/30/2011  . Gross hematuria 09/19/2011  . Benign prostatic hyperplasia with urinary retention 09/10/2011  . Elevated prostate specific antigen (PSA) 09/10/2011    Past Surgical  History:  Procedure Laterality Date  . EYE SURGERY     CATARACTS  . HERNIA REPAIR    . SKIN CANCER EXCISION      Prior to Admission medications   Medication Sig Start Date End Date Taking? Authorizing Provider  Alpha-Lipoic Acid 200 MG CAPS Take by mouth.    [provider]  ALPRAZolam Duanne Moron) 0.25 MG tablet Take 0.25 mg by mouth at bedtime as needed.      [provider]  atorvastatin (LIPITOR) 10 MG tablet Take 10 mg by mouth daily.      [provider]  Calcium Carbonate (CALCIUM 600 PO) Take by mouth.      [provider]  Cholecalciferol (VITAMIN D) 2000 UNITS tablet Take 2,000 Units by mouth daily.      [provider]  CIALIS 5 MG tablet TK 1/2 T PO QD 06/16/17   [provider]  Coenzyme Q10 10 MG capsule Take 10 mg by mouth.    [provider]  colchicine 0.6 MG tablet Take 0.6 mg by mouth.    [provider]  Febuxostat (ULORIC) 80 MG TABS Take by mouth.      [provider]  gentamicin cream (GARAMYCIN) 0.1 % Apply 1 application topically 3 (three) times daily. 06/16/18   Edrick Kins, DPM  glimepiride (AMARYL) 2 MG tablet Take 2 mg by mouth daily before breakfast.      [provider]  glimepiride (AMARYL) 4 MG tablet TK 1 T PO QD WITH BRE OR THE FIRST MAIN MEAL OF THE DAY 06/15/17   [provider]  ibandronate (BONIVA) 150 MG tablet Take 150 mg by mouth every 30 (thirty) days. Take in the morning with a full glass of water, on an empty stomach, and do not take anything else by mouth or lie down for the next 30 min.     [provider]  Ibuprofen (ADVIL PO) Take by mouth.      [provider]  lisinopril (PRINIVIL,ZESTRIL) 5 MG tablet Take 5 mg by mouth daily.      [provider]  Multiple Vitamins-Minerals (MULTIVITAMIN ADULT EXTRA C PO) Take by mouth.    [provider]  Multiple Vitamins-Minerals (MULTIVITAMIN ADULT PO) Take by mouth.     [provider]  nystatin-triamcinolone (MYCOLOG II) cream Apply to affected areas bid 05/08/16   [provider]  Red Yeast Rice 600 MG TABS Take by mouth.    [provider]  tadalafil (CIALIS) 5 MG tablet Take 5 mg by mouth. 05/21/17   [provider]  tadalafil (CIALIS) 5 MG tablet TK 1/2 T PO QD 09/16/17   [provider]  Vitamins A & D (VITAMIN A & D) [address] UNITS TABS Take by mouth.      [provider]  warfarin (COUMADIN) 5 MG tablet Take 5 mg by mouth daily.      [provider]    Allergies Patient has no known allergies.  History reviewed. No pertinent family history.  Social History Social History   Tobacco Use  . Smoking status: Unknown If Ever Smoked  . Smokeless tobacco: Never Used  Substance Use Topics  . Alcohol use: No  . Drug use: Not on file    Review of Systems  Constitutional: No fever/chills Eyes: No visual changes. ENT: No sore throat. Cardiovascular: Denies chest pain. Respiratory: Denies shortness of breath. Gastrointestinal: No abdominal pain.  No nausea, no vomiting.  No diarrhea.  No constipation. Genitourinary: Negative for dysuria. Musculoskeletal: Negative for back pain. Skin: As above Neurological: Negative for headaches, focal weakness or numbness.   ____________________________________________   PHYSICAL EXAM:  VITAL SIGNS: ED Triage Vitals [11/28/18 1457]  Enc Vitals Group     BP 140/74     Pulse Rate 74     Resp 18     Temp (!) 97.4 F (36.3 C)     Temp Source Oral     SpO2 99 %     Weight 210 lb (95.3 kg)     Height 6' (1.829 m)     Head Circumference      Peak Flow      Pain Score 0     Pain Loc      Pain Edu?      Excl. in [place]?     Constitutional: Alert and oriented.  in no acute distress. Eyes: Conjunctivae are normal.  Head: Atraumatic. Nose: No congestion/rhinnorhea. Mouth/Throat: Mucous membranes are moist.  Neck: No stridor.     Cardiovascular: Normal rate, regular rhythm. Grossly normal heart sounds.   Respiratory: Normal respiratory effort.  No retractions. Lungs CTAB. Gastrointestinal: Soft and nontender. No distention.  Musculoskeletal:   Right great toe with blackened and necrotic appearing area to the distal one third of the toe.  The rest of the toe is blue and dusky.  To the plantar surface of the great toe there is  avulsed skin, superficially.  No active bleeding at this time.  There is a fishy odor to the toe.  Proximally the foot, ankle as well as calf are swollen to the mid calf.  There is no bony tenderness over the foot.  Patient with full range of motion to the ankle.  Bilaterally, there are faint but palpable posterior tibial pulses.  Patient with decreased sensation to the bilateral feet which is baseline.  Superficial and small area of skin tear to the left hand dorsum, medially.  No bony tenderness.  Full range of motion to the left upper extremity including the left shoulder where there is no tenderness or deformity.  Neurologic:  Normal speech and language. No gross focal neurologic deficits are appreciated. Skin:  Skin is warm, dry and intact. No rash noted. Psychiatric: Mood and affect are normal. Speech and behavior are normal.  ____________________________________________   LABS (all labs ordered are listed, but only abnormal results are displayed)  Labs Reviewed  BASIC METABOLIC PANEL - Abnormal; Notable for the following components:      Result Value   Glucose, Bld 200 (*)    BUN 39 (*)    Creatinine, Ser 1.98 (*)    Calcium 8.6 (*)    GFR calc non Af Amer 30 (*)    GFR calc Af Amer 35 (*)    All other components within normal limits  CULTURE, BLOOD (ROUTINE X 2)  CULTURE, BLOOD (ROUTINE X 2)  CBC  URINALYSIS, ROUTINE W REFLEX MICROSCOPIC  I-STAT CG4 LACTIC ACID, ED  I-STAT CG4 LACTIC ACID, ED   ____________________________________________  EKG  ED ECG REPORT I, Doran Stabler, the attending physician, personally viewed and interpreted this ECG.   Date: 11/28/2018  EKG Time: 1536  Rate: 81  Rhythm: atrial fibrillation, rate 81  Axis: Normal  Intervals:right bundle branch block and left anterior fascicular block  ST&T Change: No ST segment elevation or depression.  No abnormal T wave inversion. No previous EKGs on record for comparison.  Unclear if new or old A. fib. 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 ____________________________________________   PROCEDURES  Procedure(s) performed:   Procedures  Critical Care performed:   ____________________________________________   INITIAL IMPRESSION / ASSESSMENT AND PLAN / ED COURSE  Pertinent labs & imaging results that were available during my care of the patient were reviewed by me and considered in my medical decision making (see chart for details).  DDX: Cellulitis, necrotic toe, kidney failure, hyperglycemia, sepsis, cellulitis As part of my medical decision making, I reviewed the following data within the electronic MEDICAL RECORD NUMBER Notes from prior ED visits  ----------------------------------------- 4:17 PM on 11/28/2018 -----------------------------------------  Patient at this time with an EKG with atrial fibrillation.  Also atrial fibrillation on the monitor.  Patient says that he does not have a history of atrial fibrillation.  Concerning x-ray with what appears to be osteomyelitis/arthritis.  Patient to be admitted to the hospital.  Patient aware of need for admission to the hospital.  Signed out to Dr. Verdell Carmine.   ____________________________________________   FINAL CLINICAL IMPRESSION(S) / ED DIAGNOSES  Final diagnoses:  Necrosis ([place])  Right lower extremity cellulitis.  Necrotic right great toe.   Atrial fibrillation.    NEW MEDICATIONS STARTED DURING THIS VISIT:  New Prescriptions   No medications on file     Note:  This  document was prepared using Systems analyst and may include unintentional dictation errors.     Orbie Pyo, MD 11/28/18 [id number]

## 2018-11-29 ENCOUNTER — Encounter
Admission: EM | Disposition: A | Discharge status: Home Health | Payer: Self-pay | Source: Home / Self Care | Attending: Specialist

## 2018-11-29 ENCOUNTER — Encounter: Payer: Self-pay | Admitting: Anesthesiology

## 2018-11-29 ENCOUNTER — Other Ambulatory Visit: Payer: Self-pay

## 2018-11-29 ENCOUNTER — Inpatient Hospital Stay: Payer: Medicare Other | Admitting: Anesthesiology

## 2018-11-29 HISTORY — PX: AMPUTATION TOE: SHX6595

## 2018-11-29 LAB — BASIC METABOLIC PANEL
Anion gap: 9 (ref 5–15)
BUN: 38 mg/dL — ABNORMAL HIGH (ref 8–23)
CO2: 20 mmol/L — ABNORMAL LOW (ref 22–32)
Calcium: 8 mg/dL — ABNORMAL LOW (ref 8.9–10.3)
Chloride: 107 mmol/L (ref 98–111)
Creatinine, Ser: 1.8 mg/dL — ABNORMAL HIGH (ref 0.61–1.24)
GFR calc Af Amer: 39 mL/min — ABNORMAL LOW (ref >60)
GFR calc non Af Amer: 34 mL/min — ABNORMAL LOW (ref >60)
Glucose, Bld: 187 mg/dL — ABNORMAL HIGH (ref 70–99)
Potassium: 4.2 mmol/L (ref 3.5–5.1)
Sodium: 136 mmol/L (ref 135–145)

## 2018-11-29 LAB — CBC
HCT: 35.1 % — ABNORMAL LOW (ref 39.0–52.0)
Hemoglobin: 11.3 g/dL — ABNORMAL LOW (ref 13.0–17.0)
MCH: 30.3 pg (ref 26.0–34.0)
MCHC: 32.2 g/dL (ref 30.0–36.0)
MCV: 94.1 fL (ref 80.0–100.0)
Platelets: 262 10*3/uL (ref 150–400)
RBC: 3.73 MIL/uL — ABNORMAL LOW (ref 4.22–5.81)
RDW: 13 % (ref 11.5–15.5)
WBC: 10.8 10*3/uL — ABNORMAL HIGH (ref 4.0–10.5)
nRBC: 0 % (ref 0.0–0.2)

## 2018-11-29 LAB — GLUCOSE, CAPILLARY: Glucose-Capillary: 160 mg/dL — ABNORMAL HIGH (ref 70–99)

## 2018-11-29 LAB — LACTIC ACID, PLASMA: Lactic Acid, Venous: 1.2 mmol/L (ref 0.5–1.9)

## 2018-11-29 LAB — SURGICAL PCR SCREEN
MRSA, PCR: NEGATIVE
Staphylococcus aureus: NEGATIVE

## 2018-11-29 LAB — PROTIME-INR
INR: 4.4
Prothrombin Time: 41.3 seconds — ABNORMAL HIGH (ref 11.4–15.2)

## 2018-11-29 SURGERY — AMPUTATION, TOE
Anesthesia: General | Laterality: Right

## 2018-11-29 MED ORDER — FENTANYL CITRATE (PF) 100 MCG/2ML IJ SOLN: 25.0000 ug | INTRAMUSCULAR | Status: DC | PRN

## 2018-11-29 MED ORDER — ONDANSETRON HCL 4 MG/2ML IJ SOLN: 4.0000 mg | Freq: Once | INTRAMUSCULAR | Status: DC | PRN

## 2018-11-29 MED ORDER — FENTANYL CITRATE (PF) 100 MCG/2ML IJ SOLN
INTRAMUSCULAR | Status: AC
  Filled 2018-11-29: qty 2

## 2018-11-29 MED ORDER — PROPOFOL 10 MG/ML IV BOLUS
INTRAVENOUS | Status: DC | PRN
  Administered 2018-11-29: 100 mg via INTRAVENOUS
  Administered 2018-11-29: 20 mg via INTRAVENOUS

## 2018-11-29 MED ORDER — INSULIN ASPART 100 UNIT/ML ~~LOC~~ SOLN
0.0000 [IU] | Freq: Three times a day (TID) | SUBCUTANEOUS | Status: DC
  Administered 2018-11-30 (×2): 2 [IU] via SUBCUTANEOUS
  Administered 2018-11-30: 1 [IU] via SUBCUTANEOUS
  Administered 2018-12-01 – 2018-12-02 (×5): 2 [IU] via SUBCUTANEOUS
  Administered 2018-12-03: 3 [IU] via SUBCUTANEOUS
  Administered 2018-12-03 – 2018-12-04 (×4): 2 [IU] via SUBCUTANEOUS
  Filled 2018-11-29 (×14): qty 1

## 2018-11-29 MED ORDER — PROPOFOL 10 MG/ML IV BOLUS
INTRAVENOUS | Status: AC
  Filled 2018-11-29: qty 20

## 2018-11-29 MED ORDER — BUPIVACAINE HCL 0.5 % IJ SOLN
INTRAMUSCULAR | Status: DC | PRN
  Administered 2018-11-29: 10 mL

## 2018-11-29 MED ORDER — PHENYLEPHRINE HCL 10 MG/ML IJ SOLN
INTRAMUSCULAR | Status: DC | PRN
  Administered 2018-11-29 (×5): 50 ug via INTRAVENOUS

## 2018-11-29 MED ORDER — SEVOFLURANE IN SOLN
RESPIRATORY_TRACT | Status: AC
  Filled 2018-11-29: qty 250

## 2018-11-29 MED ORDER — ONDANSETRON HCL 4 MG/2ML IJ SOLN
INTRAMUSCULAR | Status: DC | PRN
  Administered 2018-11-29: 4 mg via INTRAVENOUS

## 2018-11-29 SURGICAL SUPPLY — 38 items
"PENCIL ELECTRO HAND CTR " (MISCELLANEOUS) ×1 IMPLANT
BANDAGE ELASTIC 4 LF NS (GAUZE/BANDAGES/DRESSINGS) ×2 IMPLANT
BLADE MED AGGRESSIVE (BLADE) ×2 IMPLANT
BLADE SURG 15 STRL LF DISP TIS (BLADE) ×4 IMPLANT
BLADE SURG 15 STRL SS (BLADE) ×4
BNDG CONFORM 3 STRL LF (GAUZE/BANDAGES/DRESSINGS) ×2 IMPLANT
BNDG ESMARK 4X12 TAN STRL LF (GAUZE/BANDAGES/DRESSINGS) ×2 IMPLANT
BNDG GAUZE 4.5X4.1 6PLY STRL (MISCELLANEOUS) ×2 IMPLANT
CANISTER SUCT 1200ML W/VALVE (MISCELLANEOUS) ×2 IMPLANT
COVER WAND RF STERILE (DRAPES) ×2 IMPLANT
CUFF TOURN 18 STER (MISCELLANEOUS) ×2 IMPLANT
CUFF TOURN DUAL PL 12 NO SLV (MISCELLANEOUS) ×2 IMPLANT
DRAPE FLUOR MINI C-ARM 54X84 (DRAPES) ×2 IMPLANT
DURAPREP 26ML APPLICATOR (WOUND CARE) ×2 IMPLANT
ELECT REM PT RETURN 9FT ADLT (ELECTROSURGICAL) ×2
ELECTRODE REM PT RTRN 9FT ADLT (ELECTROSURGICAL) ×1 IMPLANT
GAUZE PETRO XEROFOAM 1X8 (MISCELLANEOUS) ×2 IMPLANT
GAUZE SPONGE 4X4 12PLY STRL (GAUZE/BANDAGES/DRESSINGS) ×2 IMPLANT
GLOVE BIO SURGEON STRL SZ8 (GLOVE) ×2 IMPLANT
GLOVE INDICATOR 8.0 STRL GRN (GLOVE) ×2 IMPLANT
GOWN STRL REUS W/ TWL LRG LVL3 (GOWN DISPOSABLE) ×2 IMPLANT
GOWN STRL REUS W/TWL LRG LVL3 (GOWN DISPOSABLE) ×2
KIT TURNOVER KIT A (KITS) ×2 IMPLANT
LABEL OR SOLS (LABEL) ×2 IMPLANT
NDL HYPO 25X1 1.5 SAFETY (NEEDLE) ×3 IMPLANT
NDL SAFETY ECLIPSE 18X1.5 (NEEDLE) ×1 IMPLANT
NEEDLE HYPO 18GX1.5 SHARP (NEEDLE) ×1
NEEDLE HYPO 25X1 1.5 SAFETY (NEEDLE) ×6 IMPLANT
NS IRRIG 500ML POUR BTL (IV SOLUTION) ×2 IMPLANT
PACK EXTREMITY ARMC (MISCELLANEOUS) ×2 IMPLANT
PENCIL ELECTRO HAND CTR (MISCELLANEOUS) ×2 IMPLANT
RASP SM TEAR CROSS CUT (RASP) ×2 IMPLANT
STOCKINETTE STRL 6IN 960660 (GAUZE/BANDAGES/DRESSINGS) ×2 IMPLANT
SUT ETH BLK MONO 3 0 FS 1 12/B (SUTURE) ×2 IMPLANT
SUT ETHILON 5 0 PS 2 18 (SUTURE) ×2 IMPLANT
SUT VIC AB 4-0 FS2 27 (SUTURE) ×2 IMPLANT
SWAB CULTURE AMIES ANAERIB BLU (MISCELLANEOUS) ×2 IMPLANT
SYR 10ML LL (SYRINGE) ×2 IMPLANT

## 2018-11-29 NOTE — Progress Notes (Signed)
[facility]                                                                                                                                                                                  Patient Demographics   Avian Konigsberg, is a 84 y.o. male, DOB - 21-Oct-1931, ION:[id number]  Admit date - 11/28/2018   Admitting Physician Henreitta Leber, MD  Outpatient Primary MD for the patient is [name], MD   LOS - 1  Subjective: Patient underwent first ray amputation of the right foot to alleviate the gangrene and removal of the infectious bone and soft tissue Currently denies any pain   Review of Systems:   CONSTITUTIONAL: No documented fever. No fatigue, weakness. No weight gain, no weight loss.  EYES: No blurry or double vision.  ENT: No tinnitus. No postnasal drip. No redness of the oropharynx.  RESPIRATORY: No cough, no wheeze, no hemoptysis. No dyspnea.  CARDIOVASCULAR: No chest pain. No orthopnea. No palpitations. No syncope.  GASTROINTESTINAL: No nausea, no vomiting or diarrhea. No abdominal pain. No melena or hematochezia.  GENITOURINARY: No dysuria or hematuria.  ENDOCRINE: No polyuria or nocturia. No heat or cold intolerance.  HEMATOLOGY: No anemia. No bruising. No bleeding.  INTEGUMENTARY: No rashes. No lesions.  MUSCULOSKELETAL: No arthritis. No swelling. No gout.  NEUROLOGIC: No numbness, tingling, or ataxia. No seizure-type activity.  PSYCHIATRIC: No anxiety. No insomnia. No ADD.    Vitals:   Vitals:   11/29/18 0930 11/29/18 0944 11/29/18 1000 11/29/18 1016  BP: 130/75 130/68 (!) 142/66 139/89  Pulse: 67 69 60 63  Resp: (!) 23 14 18    Temp:  98.6 F (37 C)  (!) 97.5 F (36.4 C)  TempSrc:    Oral  SpO2: 96% 97% 99% 100%  Weight:      Height:        Wt Readings from Last 3 Encounters:  11/28/18 95.3 kg  05/11/18 101.2 kg     Intake/Output Summary (Last 24 hours) at 11/29/2018 1331 Last data filed at 11/29/2018  0948 Gross per 24 hour  Intake 1537.53 ml  Output 1275 ml  Net 262.53 ml    Physical Exam:   GENERAL: Pleasant-appearing in no apparent distress.  HEAD, EYES, EARS, NOSE AND THROAT: Atraumatic, normocephalic. Extraocular muscles are intact. Pupils equal and reactive to light. Sclerae anicteric. No conjunctival injection. No oro-pharyngeal erythema.  NECK: Supple. There is no jugular venous distention. No bruits, no lymphadenopathy, no thyromegaly.  HEART: Regular rate and rhythm,. No murmurs, no rubs, no clicks.  LUNGS: Clear to auscultation bilaterally. No rales or rhonchi. No wheezes.  ABDOMEN: Soft, flat, nontender, nondistended.  Has good bowel sounds. No hepatosplenomegaly appreciated.  EXTREMITIES: No evidence of any cyanosis, clubbing, or peripheral edema.  +2 pedal and radial pulses bilaterally.  Right great toe dressing in place NEUROLOGIC: The patient is alert, awake, and oriented x3 with no focal motor or sensory deficits appreciated bilaterally.  SKIN: Moist and warm with no rashes appreciated.  Psych: Not anxious, depressed LN: No inguinal LN enlargement    Antibiotics   Anti-infectives (From admission, onward)   Start     Dose/Rate Route Frequency Ordered Stop   11/29/18 2200  piperacillin-tazobactam (ZOSYN) IVPB 3.375 g     3.375 g 12.5 mL/hr over 240 Minutes Intravenous Every 8 hours 11/28/18 1727     11/28/18 2300  vancomycin (VANCOCIN) IVPB 750 mg/150 ml premix     750 mg 150 mL/hr over 60 Minutes Intravenous Every 12 hours 11/28/18 1727     11/28/18 1530  ceFEPIme (MAXIPIME) 2 g in sodium chloride 0.9 % 100 mL IVPB     2 g 200 mL/hr over 30 Minutes Intravenous  Once 11/28/18 1519 11/28/18 1658   11/28/18 1530  metroNIDAZOLE (FLAGYL) IVPB 500 mg  Status:  Discontinued     500 mg 100 mL/hr over 60 Minutes Intravenous Every 8 hours 11/28/18 1519 11/28/18 1727   11/28/18 1530  vancomycin (VANCOCIN) IVPB 1000 mg/200 mL premix     1,000 mg 200 mL/hr over 60 Minutes  Intravenous  Once 11/28/18 1519 11/28/18 1801      Medications   Scheduled Meds: . atorvastatin  10 mg Oral QPM  . cholecalciferol  2,000 Units Oral Daily  . colchicine  0.6 mg Oral Daily  . febuxostat  80 mg Oral Daily  . insulin aspart  0-9 Units Subcutaneous TID WC  . lisinopril  5 mg Oral Daily  . multivitamin with minerals   Oral Daily   Continuous Infusions: . sodium chloride 0 mL/hr at 11/29/18 0600  . piperacillin-tazobactam (ZOSYN)  IV    . vancomycin 750 mg (11/29/18 1216)   PRN Meds:.acetaminophen **OR** acetaminophen, ALPRAZolam, hydrALAZINE, ondansetron **OR** ondansetron (ZOFRAN) IV   Data Review:   Micro Results Recent Results (from the past 240 hour(s))  Blood Culture (routine x 2)     Status: None (Preliminary result)   Collection Time: 11/28/18  4:15 PM  Result Value Ref Range Status   Specimen Description BLOOD R HAND  Final   Special Requests   Final    BOTTLES DRAWN AEROBIC AND ANAEROBIC Blood Culture results may not be optimal due to an excessive volume of blood received in culture bottles   Culture   Final    NO GROWTH < 24 HOURS Performed at [facility], [address] [place], [place] [address]    Report Status PENDING  Incomplete  Blood Culture (routine x 2)     Status: None (Preliminary result)   Collection Time: 11/28/18  4:15 PM  Result Value Ref Range Status   Specimen Description BLOOD R WRIST  Final   Special Requests   Final    BOTTLES DRAWN AEROBIC AND ANAEROBIC Blood Culture results may not be optimal due to an excessive volume of blood received in culture bottles   Culture   Final    NO GROWTH < 24 HOURS Performed at [facility], [address] [place], [place] [address]    Report Status PENDING  Incomplete  Aerobic/Anaerobic Culture (surgical/deep wound)     Status: None (Preliminary result)   Collection Time: 11/28/18  6:14 PM  Result Value Ref Range Status   Specimen Description   Final     TOE Performed at [facility], [address], [place], [place] [address]    Special Requests   Final    RIGHT Performed at [facility], [place], [place] [address]    Gram Stain NO WBC SEEN RARE GRAM POSITIVE COCCI IN PAIRS   Final   Culture   Final    TOO YOUNG TO READ Performed at [facility], [place] [address] [place], [place] [address]    Report Status PENDING  Incomplete  Surgical pcr screen     Status: None   Collection Time: 11/29/18  6:30 AM  Result Value Ref Range Status   MRSA, PCR NEGATIVE NEGATIVE Final   Staphylococcus aureus NEGATIVE NEGATIVE Final    Comment: (NOTE) The Xpert SA Assay (FDA approved for NASAL specimens in patients 50 years of age and older), is one component of a comprehensive surveillance program. It is not intended to diagnose infection nor to guide or monitor treatment. Performed at [facility], [address], [place], [place] [address]     Radiology Reports Dg Foot Complete Right  Result Date: 11/28/2018 CLINICAL DATA:  Diabetic patient with a black great toe. EXAM: RIGHT FOOT COMPLETE - 3+ VIEW COMPARISON:  Plain films of the feet 06/14/2013. FINDINGS: Extensive bony destructive change is seen centered about the first MTP joint consistent with septic joint and osteomyelitis. No other plain film evidence of osteomyelitis is seen. Soft tissues about the first MTP joint and great toe are markedly swollen. Extensive atherosclerosis is identified. 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 Severe soft tissue swelling about the toe. Atherosclerosis. Electronically Signed   By: Inge Rise M.D.   On: 11/28/2018 15:53     CBC Recent Labs  Lab 11/28/18 1502 11/29/18 0354  WBC 12.9* 10.8*  HGB 13.6 11.3*  HCT 42.4 35.1*  PLT 302 262  MCV 95.3 94.1  MCH 30.6  30.3  MCHC 32.1 32.2  RDW 12.7 13.0  LYMPHSABS 2.1  --   MONOABS 1.2*  --   EOSABS 0.4  --   BASOSABS 0.0  --     Chemistries  Recent Labs  Lab 11/28/18 1502 11/29/18 0354  NA 136 136  K 4.8 4.2  CL 106 107  CO2 22 20*  GLUCOSE 200* 187*  BUN 39* 38*  CREATININE 1.98* 1.80*  CALCIUM 8.6* 8.0*   ------------------------------------------------------------------------------------------------------------------ estimated creatinine clearance is 35.9 mL/min (A) (by C-G formula based on SCr of 1.8 mg/dL (H)). ------------------------------------------------------------------------------------------------------------------ No results for input(s): HGBA1C in the last 72 hours. ------------------------------------------------------------------------------------------------------------------ No results for input(s): CHOL, HDL, LDLCALC, TRIG, CHOLHDL, LDLDIRECT in the last 72 hours. ------------------------------------------------------------------------------------------------------------------ No results for input(s): TSH, T4TOTAL, T3FREE, THYROIDAB in the last 72 hours.  Invalid input(s): FREET3 ------------------------------------------------------------------------------------------------------------------ No results for input(s): VITAMINB12, FOLATE, FERRITIN, TIBC, IRON, RETICCTPCT in the last 72 hours.  Coagulation profile Recent Labs  Lab 11/28/18 1502 11/29/18 0634  INR 3.95 4.40*    No results for input(s): DDIMER in the last 72 hours.  Cardiac Enzymes No results for input(s): CKMB, TROPONINI, MYOGLOBIN in the last 168 hours.  Invalid input(s): CK ------------------------------------------------------------------------------------------------------------------ Invalid input(s): POCBNP    Assessment & Plan   84 year old male with past medical history of diabetes, hypertension, chronic kidney disease, prostate cancer who  presents to the hospital due to right foot  pain and swelling and also a necrotic appearing right great toe.  1.  Right foot/great toe osteomyelitis/necrosis-  Appreciate podiatry input status post amputation of the gangrene right toe Continue IV antibiotics Continue current wound care  2.  Diabetes type 2 with neuropathy- patient currently not on sliding scale I will place him on sliding scale  Resume Amaryl   3.  Essential hypertension-continue lisinopril.  4.  History of gout-no acute attack.  Continue Uloric.  5.  Hyperlipidemia-continue atorvastatin.  6.  Anxiety-continue Xanax.  7.  History of recurrent pulmonary embolism due to factor V Leiden mutation- now is 4.1 Coumadin to be held can be resumed tomorrow.     Code Status Orders  (From admission, onward)         Start     Ordered   11/29/18 1202  Do not attempt resuscitation (DNR)  Continuous    Question Answer Comment  In the event of cardiac or respiratory ARREST Do not call a "code blue"   In the event of cardiac or respiratory ARREST Do not perform Intubation, CPR, defibrillation or ACLS   In the event of cardiac or respiratory ARREST Use medication by any route, position, wound care, and other measures to relive pain and suffering. May use oxygen, suction and manual treatment of airway obstruction as needed for comfort.      11/29/18 1201        Code Status History    Date Active Date Inactive Code Status Order ID Comments User Context   11/28/2018 1800 11/29/2018 1201 Full Code [id number]  Henreitta Leber, MD Inpatient           Consults podiatry DVT Prophylaxis Coumadin  Lab Results  Component Value Date   PLT 262 11/29/2018     Time Spent in minutes   35 minutes  Greater than 50% of time spent in care coordination and counseling patient regarding the condition and plan of care.   Dustin Flock M.D on 11/29/2018 at 1:31 PM  Between 7am to 6pm - Pager - [phone]  After 6pm go to [email or web address] - [employer]  [employer]   Office  [phone]

## 2018-11-29 NOTE — Anesthesia Post-op Follow-up Note (Signed)
Anesthesia QCDR form completed.        

## 2018-11-29 NOTE — Op Note (Signed)
Operative note   Surgeon: Dr. Albertine Patricia, DPM.    Assistant: None    Preop diagnosis: 1.  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  First ray amputation right foot to alleviate the gangrene and remove the infected bone and soft tissue          EBL: 45 mL    Anesthesia:general delivered by the anesthesia team and I utilized 10 cc of 0.5% Marcaine plain the base of the first metatarsal for local anesthesia prior to the surgery.    Hemostasis: Ankle tourniquet at 250 mils mercury pressure for 14 minutes    Specimen: 1.  Bone culture from the first metatarsal head.  2.  Resection of first metatarsal to the point of solid bone formation with proximal portion marked for evaluation of clear margins of infection    Complications: None    Operative indications: Patient's toe had become gangrenous over the last week.  He had a longstanding ulceration sub-met one is being treated by another practice group from [place].  Wound is longstanding for over 6 months.  Came into the emergency room with his black toe yesterday x-ray showed severe comminution demineralization and breakdown of the first metatarsal head and the base of the proximal phalanx.  In conjunction with the gangrenous toe we felt like first ray amputation be the best course of action for the patient    Procedure:  Patient was brought into the OR and placed on the operating table in thesupine position. After anesthesia was obtained theright lower extremity was prepped and draped in usual sterile fashion.  Operative Report: This time attention was directed to the right foot at the first metatarsal phalangeal joint and great toe.  2 similar incisions were made around the toe started dorsally.  These were joined plantarly and connected to the ulceration that was  present sub-met 1.  This incision was carried down to bone and the toe was freed from soft tissue attachments at the MTP joint level and removed.  This point is noted that severe combination of breakdown of bone at the proximal phalanx and the metatarsal head were present.  A portion of the metatarsal head was cultured for aerobic and anaerobic evaluation.  This point the metatarsal was traced proximally until good solid bone was encountered approximately two thirds of the way proximal.  Metatarsal was then resected at this point and this was sent to pathology.  Gangrenous toe was also sent to pathology. This point there was purulence draining from the flexor tendon sheath in the region.  This was then opened up the sesamoids were removed and necrotic tendon was removed here also.  This traced approximately 4 to 5 cm proximally which at this point area tissues appear to be clean.  Infected soft tissue was now removed utilizing versa jet debridement. This point I elected to release the tourniquets and reasonable capillary bleeding was achieved at this point.  It appeared that all soft tissue necrosis had been removed.  This point 4-0 Vicryl was used to suture the deep fascial layer with a running stitch.  Skin was then closed following further copious irrigation which is also been accomplished prior to any closure.  3-0 nylon was used with a combination of simple interrupted and vertical mattress sutures to close the skin incisions.  The  plantar wound was partially closed but packed with saline soaked sterile gauze to allow for continued drainage to the region.  This point a sterile dressing was placed across the area consisting of 4 x 4's ABD pads lightly applied conformer and to Kerlix along with an Ace wrap.  Patient is to remain on bedrest at this time.    Patient tolerated the procedure and anesthesia well.  Was transported from the OR to the PACU with all vital signs stable and vascular status intact. To  be discharged per routine protocol.  Will follow up in approximately 1 week in the outpatient clinic.

## 2018-11-29 NOTE — Progress Notes (Signed)
Advanced care plan.  Purpose of the Encounter: CODE STATUS  Parties in [place] him self   Patient's Decision Capacity:intact  Subjective/Patient's story: Richard [name]  is a 84 y.o. male with a known history of diabetes, chronic kidney disease, history of prostate cancer, history of pulmonary embolism, history of factor V Leiden mutation who presents to the hospital due to right foot/toe pain redness and swelling.    Objective/Medical story I d/w with patient regarding his desires for cardiac and pulmonary resucication   Goals of care determination:   He wishes to be DNR previously full code  CODE STATUS: DNR   Time spent discussing advanced care planning: 16 minutes

## 2018-11-29 NOTE — Anesthesia Procedure Notes (Signed)
Procedure Name: LMA Insertion Date/Time: 11/29/2018 8:17 AM Performed by: Lendon Colonel, CRNA Pre-anesthesia Checklist: Patient identified, Patient being monitored, Timeout performed, Emergency Drugs available and Suction available Patient Re-evaluated:Patient Re-evaluated prior to induction Oxygen Delivery Method: Circle system utilized Preoxygenation: Pre-oxygenation with 100% oxygen Induction Type: IV induction Ventilation: Mask ventilation without difficulty LMA: LMA inserted LMA Size: 5.0 Tube type: Oral Number of attempts: 1 Placement Confirmation: positive ETCO2 and breath sounds checked- equal and bilateral Tube secured with: Tape Dental Injury: Teeth and Oropharynx as per pre-operative assessment

## 2018-11-29 NOTE — Consult Note (Signed)
Infectious Disease     Reason for Consult: Diabetic foot infection    Referring Physician: Serita Grit Date of Admission:  11/28/2018   Active Problems:   Acute osteomyelitis of toe of right foot (HCC)   HPI: Richard [name] is a 84 y.o. male with hx DM foot ulceration fo 6 months treated as otpt now with progressive discoloration of the great toe.  On admit wbc 12 and no fevers, Xray showed osteomyelitis. On 12/15 had  First ray amputation.   BXC neg, MRSA PCR negative. Wound cx with gram positive cocci in pairs.  Tolerated procedure well.    Past Medical History:  Diagnosis Date  . Cancer ([place])   . Chronic kidney disease   . Colon polyp   . Diabetes mellitus   . History of blood clotting disorder    Past Surgical History:  Procedure Laterality Date  . EYE SURGERY     CATARACTS  . HERNIA REPAIR    . SKIN CANCER EXCISION     Social History   Tobacco Use  . Smoking status: Unknown If Ever Smoked  . Smokeless tobacco: Never Used  Substance Use Topics  . Alcohol use: No  . Drug use: Not on file   History reviewed. No pertinent family history.  Allergies: No Known Allergies  Current antibiotics: Antibiotics Given (last 72 hours)    Date/Time Action Medication Dose Rate   11/28/18 1624 New Bag/Given   ceFEPIme (MAXIPIME) 2 g in sodium chloride 0.9 % 100 mL IVPB 2 g 200 mL/hr   11/28/18 1625 New Bag/Given   metroNIDAZOLE (FLAGYL) IVPB 500 mg 500 mg 100 mL/hr   11/28/18 1701 New Bag/Given   vancomycin (VANCOCIN) IVPB 1000 mg/200 mL premix 1,000 mg 200 mL/hr   11/28/18 2217 New Bag/Given   vancomycin (VANCOCIN) IVPB 750 mg/150 ml premix 750 mg 150 mL/hr   11/29/18 1216 New Bag/Given   vancomycin (VANCOCIN) IVPB 750 mg/150 ml premix 750 mg 150 mL/hr      MEDICATIONS: . atorvastatin  10 mg Oral QPM  . cholecalciferol  2,000 Units Oral Daily  . colchicine  0.6 mg Oral Daily  . febuxostat  80 mg Oral Daily  . insulin aspart  0-9 Units Subcutaneous TID WC  . lisinopril   5 mg Oral Daily  . multivitamin with minerals   Oral Daily    Review of Systems - 11 systems reviewed and negative per HPI   OBJECTIVE: Temp:  [97.4 F (36.3 C)-98.7 F (37.1 C)] 97.5 F (36.4 C) (12/15 1016) Pulse Rate:  [60-77] 63 (12/15 1016) Resp:  [14-30] 18 (12/15 1000) BP: (116-186)/(65-99) 139/89 (12/15 1016) SpO2:  [96 %-100 %] 100 % (12/15 1016) Weight:  [95.3 kg] 95.3 kg (12/14 1457) Physical Exam  Constitutional: He is oriented to person, place, and time. He appears well-developed and well-nourished. No distress.  HENT:  Mouth/Throat: Oropharynx is clear and moist. No oropharyngeal exudate.  Cardiovascular: Normal rate, regular rhythm and normal heart sounds. Exam reveals no gallop and no friction rub.  No murmur heard.  Pulmonary/Chest: Effort normal and breath sounds normal. No respiratory distress. He has no wheezes.  Abdominal: Soft. Bowel sounds are normal. He exhibits no distension. There is no tenderness.  Lymphadenopathy:  He has no cervical adenopathy.  Neurological: He is alert and oriented to person, place, and time.  Skin:  R foot wrapped post op Psychiatric: He has a normal mood and affect. His behavior is normal.     LABS: Results  for orders placed or performed during the hospital encounter of 11/28/18 (from the past 48 hour(s))  Basic metabolic panel     Status: Abnormal   Collection Time: 11/28/18  3:02 PM  Result Value Ref Range   Sodium 136 135 - 145 mmol/L   Potassium 4.8 3.5 - 5.1 mmol/L    Comment: HEMOLYSIS AT THIS LEVEL MAY AFFECT RESULT   Chloride 106 98 - 111 mmol/L   CO2 22 22 - 32 mmol/L   Glucose, Bld 200 (H) 70 - 99 mg/dL   BUN 39 (H) 8 - 23 mg/dL   Creatinine, Ser 1.98 (H) 0.61 - 1.24 mg/dL   Calcium 8.6 (L) 8.9 - 10.3 mg/dL   GFR calc non Af Amer 30 (L) >60 mL/min   GFR calc Af Amer 35 (L) >60 mL/min   Anion gap 8 5 - 15    Comment: Performed at [facility], [address] [place], [place] [address]  CBC      Status: Abnormal   Collection Time: 11/28/18  3:02 PM  Result Value Ref Range   WBC 12.9 (H) 4.0 - 10.5 K/uL   RBC 4.45 4.22 - 5.81 MIL/uL   Hemoglobin 13.6 13.0 - 17.0 g/dL   HCT 42.4 39.0 - 52.0 %   MCV 95.3 80.0 - 100.0 fL   MCH 30.6 26.0 - 34.0 pg   MCHC 32.1 30.0 - 36.0 g/dL   RDW 12.7 11.5 - 15.5 %   Platelets 302 150 - 400 K/uL   nRBC 0.0 0.0 - 0.2 %    Comment: Performed at [facility], [address] [place], [place] [address]  Differential     Status: Abnormal   Collection Time: 11/28/18  3:02 PM  Result Value Ref Range   Neutrophils Relative % 71 %   Neutro Abs 9.3 (H) 1.7 - 7.7 K/uL   Lymphocytes Relative 16 %   Lymphs Abs 2.1 0.7 - 4.0 K/uL   Monocytes Relative 9 %   Monocytes Absolute 1.2 (H) 0.1 - 1.0 K/uL   Eosinophils Relative 3 %   Eosinophils Absolute 0.4 0.0 - 0.5 K/uL   Basophils Relative 0 %   Basophils Absolute 0.0 0.0 - 0.1 K/uL    Comment: Performed at [facility], [address] [place], [place] [address]  Protime-INR     Status: Abnormal   Collection Time: 11/28/18  3:02 PM  Result Value Ref Range   Prothrombin Time 38.0 (H) 11.4 - 15.2 seconds   INR 3.95     Comment: Performed at [facility], [address] [place], [place] [address]  APTT     Status: Abnormal   Collection Time: 11/28/18  3:02 PM  Result Value Ref Range   aPTT 58 (H) 24 - 36 seconds    Comment:        IF BASELINE aPTT IS ELEVATED, SUGGEST PATIENT RISK ASSESSMENT BE USED TO DETERMINE APPROPRIATE ANTICOAGULANT THERAPY. Performed at [facility], [address] [place], [place] [address]   Blood Culture (routine x 2)     Status: None (Preliminary result)   Collection Time: 11/28/18  4:15 PM  Result Value Ref Range   Specimen Description BLOOD R HAND    Special Requests      BOTTLES DRAWN AEROBIC AND ANAEROBIC Blood Culture results may not be optimal due to an excessive volume of blood received in culture bottles   Culture       NO GROWTH <  24 HOURS Performed at [facility], [address] [place], [place] [address]    Report Status PENDING   Blood Culture (routine x 2)     Status: None (Preliminary result)   Collection Time: 11/28/18  4:15 PM  Result Value Ref Range   Specimen Description BLOOD R WRIST    Special Requests      BOTTLES DRAWN AEROBIC AND ANAEROBIC Blood Culture results may not be optimal due to an excessive volume of blood received in culture bottles   Culture      NO GROWTH < 24 HOURS Performed at [facility], [address] [place], [place] [address]    Report Status PENDING   Lactic acid, plasma     Status: Abnormal   Collection Time: 11/28/18  4:16 PM  Result Value Ref Range   Lactic Acid, Venous 2.2 (HH) 0.5 - 1.9 mmol/L    Comment: CRITICAL RESULT CALLED TO, READ BACK BY AND VERIFIED WITH GEORGIE HAHLE AT 1736 11/28/18.PMF Performed at [facility], [address] [place], [place] [address]   Aerobic/Anaerobic Culture (surgical/deep wound)     Status: None (Preliminary result)   Collection Time: 11/28/18  6:14 PM  Result Value Ref Range   Specimen Description      TOE Performed at [facility], [address], [place], [place] [address]    Special Requests      RIGHT Performed at [facility], [place], [place] [address]    Gram Stain NO WBC SEEN RARE GRAM POSITIVE COCCI IN PAIRS     Culture      TOO YOUNG TO READ Performed at [facility], [place] [address], [place], [place] [address]    Report Status PENDING   Lactic acid, plasma     Status: Abnormal   Collection Time: 11/28/18  7:21 PM  Result Value Ref Range   Lactic Acid, Venous 2.6 (HH) 0.5 - 1.9 mmol/L    Comment: CRITICAL RESULT CALLED TO, READ BACK BY AND VERIFIED WITH CHRIS LUCK ON 11/28/18 AT 2020 [facility] Performed at [facility], [address] [place], [place] [address]   APTT     Status: Abnormal   Collection Time: 11/28/18   7:21 PM  Result Value Ref Range   aPTT 64 (H) 24 - 36 seconds    Comment:        IF BASELINE aPTT IS ELEVATED, SUGGEST PATIENT RISK ASSESSMENT BE USED TO DETERMINE APPROPRIATE ANTICOAGULANT THERAPY. Performed at [facility], [address] [place], [place] [address]   Lactic acid, plasma     Status: None   Collection Time: 11/29/18 12:25 AM  Result Value Ref Range   Lactic Acid, Venous 1.2 0.5 - 1.9 mmol/L    Comment: Performed at [facility], [address] [place], [place] [address]  Basic metabolic panel     Status: Abnormal   Collection Time: 11/29/18  3:54 AM  Result Value Ref Range   Sodium 136 135 - 145 mmol/L   Potassium 4.2 3.5 - 5.1 mmol/L   Chloride 107 98 - 111 mmol/L   CO2 20 (L) 22 - 32 mmol/L   Glucose, Bld 187 (H) 70 - 99 mg/dL   BUN 38 (H) 8 - 23 mg/dL   Creatinine, Ser 1.80 (H) 0.61 - 1.24 mg/dL   Calcium 8.0 (L) 8.9 - 10.3 mg/dL   GFR calc non Af Amer 34 (L) >60 mL/min   GFR  calc Af Amer 39 (L) >60 mL/min   Anion gap 9 5 - 15    Comment: Performed at [facility], [address] [place], [place] [address]  CBC     Status: Abnormal   Collection Time: 11/29/18  3:54 AM  Result Value Ref Range   WBC 10.8 (H) 4.0 - 10.5 K/uL   RBC 3.73 (L) 4.22 - 5.81 MIL/uL   Hemoglobin 11.3 (L) 13.0 - 17.0 g/dL   HCT 35.1 (L) 39.0 - 52.0 %   MCV 94.1 80.0 - 100.0 fL   MCH 30.3 26.0 - 34.0 pg   MCHC 32.2 30.0 - 36.0 g/dL   RDW 13.0 11.5 - 15.5 %   Platelets 262 150 - 400 K/uL   nRBC 0.0 0.0 - 0.2 %    Comment: Performed at [facility], [address], [place], [place] [address]  Surgical pcr screen     Status: None   Collection Time: 11/29/18  6:30 AM  Result Value Ref Range   MRSA, PCR NEGATIVE NEGATIVE   Staphylococcus aureus NEGATIVE NEGATIVE    Comment: (NOTE) The Xpert SA Assay (FDA approved for NASAL specimens in patients 65 years of age and older), is one component of a comprehensive surveillance program. It is  not intended to diagnose infection nor to guide or monitor treatment. Performed at [facility], [address] [place], [place] [address]   Protime-INR     Status: Abnormal   Collection Time: 11/29/18  6:34 AM  Result Value Ref Range   Prothrombin Time 41.3 (H) 11.4 - 15.2 seconds   INR 4.40 (HH)     Comment: CRITICAL RESULT CALLED TO, READ BACK BY AND VERIFIED WITH: ANNA RODRIGUEZ AT 5956 ON 11/29/18 BY SNJ. Performed at [facility], [address] [place], [place] [address]   Glucose, capillary     Status: Abnormal   Collection Time: 11/29/18  9:27 AM  Result Value Ref Range   Glucose-Capillary 160 (H) 70 - 99 mg/dL   No components found for: ESR, C REACTIVE PROTEIN MICRO: Recent Results (from the past 720 hour(s))  Blood Culture (routine x 2)     Status: None (Preliminary result)   Collection Time: 11/28/18  4:15 PM  Result Value Ref Range Status   Specimen Description BLOOD R HAND  Final   Special Requests   Final    BOTTLES DRAWN AEROBIC AND ANAEROBIC Blood Culture results may not be optimal due to an excessive volume of blood received in culture bottles   Culture   Final    NO GROWTH < 24 HOURS Performed at [facility], [address] [place], [place] [address]    Report Status PENDING  Incomplete  Blood Culture (routine x 2)     Status: None (Preliminary result)   Collection Time: 11/28/18  4:15 PM  Result Value Ref Range Status   Specimen Description BLOOD R WRIST  Final   Special Requests   Final    BOTTLES DRAWN AEROBIC AND ANAEROBIC Blood Culture results may not be optimal due to an excessive volume of blood received in culture bottles   Culture   Final    NO GROWTH < 24 HOURS Performed at [facility], [address], [place], [place] [address]    Report Status PENDING  Incomplete  Aerobic/Anaerobic Culture (surgical/deep wound)     Status: None (Preliminary result)   Collection Time: 11/28/18  6:14 PM  Result  Value Ref Range  Status   Specimen Description   Final    TOE Performed at [facility], [address], [place], [place] [address]    Special Requests   Final    RIGHT Performed at [facility], [place], [place] [address]    Gram Stain NO WBC SEEN RARE GRAM POSITIVE COCCI IN PAIRS   Final   Culture   Final    TOO YOUNG TO READ Performed at [facility], [place] [address], [place], [place] [address]    Report Status PENDING  Incomplete  Surgical pcr screen     Status: None   Collection Time: 11/29/18  6:30 AM  Result Value Ref Range Status   MRSA, PCR NEGATIVE NEGATIVE Final   Staphylococcus aureus NEGATIVE NEGATIVE Final    Comment: (NOTE) The Xpert SA Assay (FDA approved for NASAL specimens in patients 65 years of age and older), is one component of a comprehensive surveillance program. It is not intended to diagnose infection nor to guide or monitor treatment. Performed at [facility], [address] [place], [place] [address]     IMAGING: Dg Foot Complete Right  Result Date: 11/28/2018 CLINICAL DATA:  Diabetic patient with a black great toe. EXAM: RIGHT FOOT COMPLETE - 3+ VIEW COMPARISON:  Plain films of the feet 06/14/2013. FINDINGS: Extensive bony destructive change is seen centered about the first MTP joint consistent with septic joint and osteomyelitis. No other plain film evidence of osteomyelitis is seen. Soft tissues about the first MTP joint and great toe are markedly swollen. Extensive atherosclerosis is identified. 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 Severe soft tissue swelling about the toe. Atherosclerosis. Electronically Signed   By: Inge Rise M.D.   On: 11/28/2018 15:53    Assessment:   Richard [name] is a 84 y.o. 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  He presented with acute changes over several days but tells me he had the wound for up to 6 months. He tells me he did not receive any antibiotics as an otpt. Now has had amputation of toe and part of 1st ray. Gram stain with GPC in pairs. MRSA PCR negative. Likely Strep species.  Recommendations Continue vanco cefepime and flagyl until cx results. Likely will need PICC and 2 weeks of IV abx followed by a tail of oral therapy. Thank you very much for allowing me to participate in the care of this patient. Please call with questions.   Cheral Marker. Ola Spurr, MD

## 2018-11-29 NOTE — H&P (Signed)
H and P has been reviewed.  Pt INR elevated so Warfarin stopped and heparin drip delayed until better levels achieved.

## 2018-11-29 NOTE — Progress Notes (Signed)
Lab notified this nurse pts INR 4.40, Charge  OR Charge Nurse Ivin Booty in room with MD Troxler, and notified MD. No orders received.

## 2018-11-29 NOTE — Anesthesia Postprocedure Evaluation (Signed)
Anesthesia Post Note  Patient: Richard [name]  Procedure(s) Performed: AMPUTATION TOE (Right )  Patient location during evaluation: PACU Anesthesia Type: General Level of consciousness: awake and alert Pain management: pain level controlled Vital Signs Assessment: post-procedure vital signs reviewed and stable Respiratory status: spontaneous breathing, nonlabored ventilation, respiratory function stable and patient connected to nasal cannula oxygen Cardiovascular status: blood pressure returned to baseline and stable Postop Assessment: no apparent nausea or vomiting Anesthetic complications: no     Last Vitals:  Vitals:   11/29/18 1000 11/29/18 1016  BP: (!) 142/66 139/89  Pulse: 60 63  Resp: 18   Temp:  (!) 36.4 C  SpO2: 99% 100%    Last Pain:  Vitals:   11/29/18 1216  TempSrc:   PainSc: 0-No pain                 Martha Clan

## 2018-11-29 NOTE — Consult Note (Signed)
ANTICOAGULATION CONSULT NOTE - Follow Up Consult  Pharmacy Consult for heparin drip  Indication: VTE prophylaxis  No Known Allergies  Patient Measurements: Height: 6' (182.9 cm) Weight: 210 lb (95.3 kg) IBW/kg (Calculated) : 77.6  Vital Signs: Temp: 97.5 F (36.4 C) (12/15 1016) Temp Source: Oral (12/15 1016) BP: 139/89 (12/15 1016) Pulse Rate: 63 (12/15 1016)  Labs: Recent Labs    11/28/18 1502 11/28/18 1921 11/29/18 0354 11/29/18 0634  HGB 13.6  --  11.3*  --   HCT 42.4  --  35.1*  --   PLT 302  --  262  --   APTT 58* 64*  --   --   LABPROT 38.0*  --   --  41.3*  INR 3.95  --   --  4.40*  CREATININE 1.98*  --  1.80*  --     Estimated Creatinine Clearance: 35.9 mL/min (A) (by C-G formula based on SCr of 1.8 mg/dL (H)).   Medical History: Past Medical History:  Diagnosis Date  . Cancer ([place])   . Chronic kidney disease   . Colon polyp   . Diabetes mellitus   . History of blood clotting disorder     Medications:  Warfarin   Assessment: Pharmacy consulted for heparin drip dosing and monitoring for 84 yo male with PMH of PE and factor V Leiden mutation. Patient is admitted with gangrene of right great toe. Planned first ray amputation surgery 12/14.  INR supratherapeutic on admission @ 3.95.  Goal of Therapy:  INR 2-3 Heparin level 0.3-0.7 units/ml Monitor platelets by anticoagulation protocol: Yes   Plan:  INR supratherapeutic. Hold heparin drip until INR closer to 2. Plan discussed with  Dr. Elvina Mattes. Surgery planned for 12/15.    Heparin order plan once INR 2-3: Give 5000 units bolus x 1 Start heparin infusion at 1700 units/hr Check anti-Xa level 6 hours after heparin initiation and daily while on heparin Continue to monitor H&H and platelets   INR ordered with AM labs.   Olivia Canter [facility] Clinical Pharmacist 11/29/2018 10:27 AM

## 2018-11-29 NOTE — Transfer of Care (Signed)
Immediate Anesthesia Transfer of Care Note  Patient: DEVEON KISIEL  Procedure(s) Performed: AMPUTATION TOE (Right )  Patient Location: PACU  Anesthesia Type:General  Level of Consciousness: sedated and patient cooperative  Airway & Oxygen Therapy: Patient Spontanous Breathing and Patient connected to face mask oxygen  Post-op Assessment: Report given to RN and Post -op Vital signs reviewed and stable  Post vital signs: Reviewed and stable  Last Vitals:  Vitals Value Taken Time  BP 116/69 11/29/2018  9:15 AM  Temp 36.3 C 11/29/2018  9:14 AM  Pulse 67 11/29/2018  9:16 AM  Resp 28 11/29/2018  9:16 AM  SpO2 100 % 11/29/2018  9:16 AM  Vitals shown include unvalidated device data.  Last Pain:  Vitals:   11/29/18 0914  TempSrc:   PainSc: Asleep         Complications: No apparent anesthesia complications

## 2018-11-30 ENCOUNTER — Other Ambulatory Visit: Payer: Self-pay

## 2018-11-30 ENCOUNTER — Ambulatory Visit: Payer: Medicare Other | Admitting: Podiatry

## 2018-11-30 ENCOUNTER — Inpatient Hospital Stay: Payer: Self-pay

## 2018-11-30 ENCOUNTER — Inpatient Hospital Stay: Payer: Medicare Other

## 2018-11-30 DIAGNOSIS — E1122 Type 2 diabetes mellitus with diabetic chronic kidney disease: Secondary | ICD-10-CM

## 2018-11-30 DIAGNOSIS — L8990 Pressure ulcer of unspecified site, unspecified stage: Secondary | ICD-10-CM

## 2018-11-30 DIAGNOSIS — N189 Chronic kidney disease, unspecified: Secondary | ICD-10-CM

## 2018-11-30 DIAGNOSIS — I739 Peripheral vascular disease, unspecified: Secondary | ICD-10-CM

## 2018-11-30 LAB — PROTIME-INR
INR: 3.87
Prothrombin Time: 37.4 seconds — ABNORMAL HIGH (ref 11.4–15.2)

## 2018-11-30 LAB — URINALYSIS, COMPLETE (UACMP) WITH MICROSCOPIC
Bilirubin Urine: NEGATIVE
Glucose, UA: 50 mg/dL — AB
Ketones, ur: NEGATIVE mg/dL
Nitrite: NEGATIVE
Protein, ur: 100 mg/dL — AB
Specific Gravity, Urine: 1.015 (ref 1.005–1.030)
WBC, UA: >50 WBC/hpf — ABNORMAL HIGH (ref 0–5)
pH: 8 (ref 5.0–8.0)

## 2018-11-30 LAB — GLUCOSE, CAPILLARY
Glucose-Capillary: 158 mg/dL — ABNORMAL HIGH (ref 70–99)
Glucose-Capillary: 179 mg/dL — ABNORMAL HIGH (ref 70–99)
Glucose-Capillary: 192 mg/dL — ABNORMAL HIGH (ref 70–99)
Glucose-Capillary: 175 mg/dL — ABNORMAL HIGH (ref 70–99)
Glucose-Capillary: 139 mg/dL — ABNORMAL HIGH (ref 70–99)
Glucose-Capillary: 176 mg/dL — ABNORMAL HIGH (ref 70–99)

## 2018-11-30 LAB — C-REACTIVE PROTEIN: CRP: 9.1 mg/dL — ABNORMAL HIGH (ref <1.0)

## 2018-11-30 LAB — VANCOMYCIN, TROUGH: Vancomycin Tr: 20 ug/mL (ref 15–20)

## 2018-11-30 LAB — SEDIMENTATION RATE: Sed Rate: 95 mm/hr — ABNORMAL HIGH (ref 0–20)

## 2018-11-30 LAB — CREATININE, SERUM
Creatinine, Ser: 1.83 mg/dL — ABNORMAL HIGH (ref 0.61–1.24)
GFR calc Af Amer: 38 mL/min — ABNORMAL LOW (ref >60)
GFR calc non Af Amer: 33 mL/min — ABNORMAL LOW (ref >60)

## 2018-11-30 MED ORDER — SODIUM CHLORIDE 0.9% FLUSH: 10.0000 mL | INTRAVENOUS | Status: DC | PRN

## 2018-11-30 MED ORDER — VANCOMYCIN HCL 10 G IV SOLR
1250.0000 mg | INTRAVENOUS | Status: DC
  Administered 2018-11-30: 1250 mg via INTRAVENOUS
  Filled 2018-11-30 (×2): qty 1250

## 2018-11-30 MED ORDER — SODIUM CHLORIDE 0.9% FLUSH
10.0000 mL | Freq: Two times a day (BID) | INTRAVENOUS | Status: DC
  Administered 2018-11-30: 30 mL
  Administered 2018-12-01 – 2018-12-04 (×4): 10 mL

## 2018-11-30 NOTE — Consult Note (Addendum)
ANTICOAGULATION CONSULT NOTE - Follow Up Consult  Pharmacy Consult for heparin drip  Indication: VTE prophylaxis  No Known Allergies  Patient Measurements: Height: 6' (182.9 cm) Weight: 210 lb (95.3 kg) IBW/kg (Calculated) : 77.6  Vital Signs: Temp: 98 F (36.7 C) (12/16 0749) Temp Source: Oral (12/16 0749) BP: 146/84 (12/16 0749) Pulse Rate: 71 (12/16 0749)  Labs: Recent Labs    11/28/18 1502 11/28/18 1921 11/29/18 0354 11/29/18 0634 11/30/18 0350  HGB 13.6  --  11.3*  --   --   HCT 42.4  --  35.1*  --   --   PLT 302  --  262  --   --   APTT 58* 64*  --   --   --   LABPROT 38.0*  --   --  41.3* 37.4*  INR 3.95  --   --  4.40* 3.87  CREATININE 1.98*  --  1.80*  --   --     Estimated Creatinine Clearance: 35.9 mL/min (A) (by C-G formula based on SCr of 1.8 mg/dL (H)).   Medical History: Past Medical History:  Diagnosis Date  . Cancer ([place])   . Chronic kidney disease   . Colon polyp   . Diabetes mellitus   . History of blood clotting disorder     Medications:  Warfarin PTA  Assessment: Pharmacy consulted for heparin drip dosing and monitoring for 84 yo male with PMH of PE and factor V Leiden mutation. Patient is admitted with gangrene of right great toe. Planned first ray amputation surgery 12/14.  12/14: INR supratherapeutic on admission @ 3.95. 12/15: INR 4.40 12/16: INR 3.87  S/p OR on 12/15 for amputation   Goal of Therapy:  INR 2-3 Heparin level 0.3-0.7 units/ml Monitor platelets by anticoagulation protocol: Yes   Plan:  INR supratherapeutic. Hold heparin drip until INR closer to 2 - MD note says start heparin once INR <2.5 INR ordered with AM labs.   Rocky Morel, PharmD, BCPS Clinical Pharmacist 11/30/2018 8:49 AM

## 2018-11-30 NOTE — Progress Notes (Signed)
Proceed with PICC placement per Dr Dustin Flock.

## 2018-11-30 NOTE — Progress Notes (Addendum)
[place] INFECTIOUS DISEASE PROGRESS NOTE Date of Admission:  11/28/2018     ID: Richard [name] is a 84 y.o. male with  Diabetic foot infection, gangrene Active Problems:   Acute osteomyelitis of toe of right foot (HCC)   Subjective: No fevers, minimal foot pain  ROS  Eleven systems are reviewed and negative except per hpi  Medications:  Antibiotics Given (last 72 hours)    Date/Time Action Medication Dose Rate   11/28/18 1624 New Bag/Given   ceFEPIme (MAXIPIME) 2 g in sodium chloride 0.9 % 100 mL IVPB 2 g 200 mL/hr   11/28/18 1625 New Bag/Given   metroNIDAZOLE (FLAGYL) IVPB 500 mg 500 mg 100 mL/hr   11/28/18 1701 New Bag/Given   vancomycin (VANCOCIN) IVPB 1000 mg/200 mL premix 1,000 mg 200 mL/hr   11/28/18 2217 New Bag/Given   vancomycin (VANCOCIN) IVPB 750 mg/150 ml premix 750 mg 150 mL/hr   11/29/18 1216 New Bag/Given   vancomycin (VANCOCIN) IVPB 750 mg/150 ml premix 750 mg 150 mL/hr   11/29/18 2140 New Bag/Given   piperacillin-tazobactam (ZOSYN) IVPB 3.375 g 3.375 g 12.5 mL/hr   11/30/18 0045 New Bag/Given   vancomycin (VANCOCIN) IVPB 750 mg/150 ml premix 750 mg 150 mL/hr   11/30/18 0610 New Bag/Given   piperacillin-tazobactam (ZOSYN) IVPB 3.375 g 3.375 g 12.5 mL/hr     . atorvastatin  10 mg Oral QPM  . cholecalciferol  2,000 Units Oral Daily  . colchicine  0.6 mg Oral Daily  . febuxostat  80 mg Oral Daily  . insulin aspart  0-9 Units Subcutaneous TID WC  . lisinopril  5 mg Oral Daily  . multivitamin with minerals   Oral Daily    Objective: Vital signs in last 24 hours: Temp:  [98 F (36.7 C)-98.5 F (36.9 C)] 98 F (36.7 C) (12/16 0749) Pulse Rate:  [71-85] 71 (12/16 0749) Resp:  [18-19] 18 (12/16 0749) BP: (128-153)/(74-86) 146/84 (12/16 0749) SpO2:  [98 %-100 %] 99 % (12/16 0749) Constitutional: He is oriented to person, place, and time. He appears well-developed and well-nourished. No distress.  HENT:  Mouth/Throat: Oropharynx is clear and  moist. No oropharyngeal exudate.  Cardiovascular: Normal rate, regular rhythm and normal heart sounds. Exam reveals no gallop and no friction rub.  No murmur heard.  Pulmonary/Chest: Effort normal and breath sounds normal. No respiratory distress. He has no wheezes.  Abdominal: Soft. Bowel sounds are normal. He exhibits no distension. There is no tenderness.  Lymphadenopathy:  He has no cervical adenopathy.  Neurological: He is alert and oriented to person, place, and time.  Skin:  R foot wrapped post op Psychiatric: He has a normal mood and affect. His behavior is normal.   Lab Results Recent Labs    11/28/18 1502 11/29/18 0354  WBC 12.9* 10.8*  HGB 13.6 11.3*  HCT 42.4 35.1*  NA 136 136  K 4.8 4.2  CL 106 107  CO2 22 20*  BUN 39* 38*  CREATININE 1.98* 1.80*    Microbiology: Results for orders placed or performed during the hospital encounter of 11/28/18  Blood Culture (routine x 2)     Status: None (Preliminary result)   Collection Time: 11/28/18  4:15 PM  Result Value Ref Range Status   Specimen Description BLOOD R HAND  Final   Special Requests   Final    BOTTLES DRAWN AEROBIC AND ANAEROBIC Blood Culture results may not be optimal due to an excessive volume of blood received in culture bottles  Culture   Final    NO GROWTH 2 DAYS Performed at [facility], [address] [place], [place] [address]    Report Status PENDING  Incomplete  Blood Culture (routine x 2)     Status: None (Preliminary result)   Collection Time: 11/28/18  4:15 PM  Result Value Ref Range Status   Specimen Description BLOOD R WRIST  Final   Special Requests   Final    BOTTLES DRAWN AEROBIC AND ANAEROBIC Blood Culture results may not be optimal due to an excessive volume of blood received in culture bottles   Culture   Final    NO GROWTH 2 DAYS Performed at [facility], [address], [place], [place] [address]    Report Status PENDING  Incomplete  Aerobic/Anaerobic  Culture (surgical/deep wound)     Status: None (Preliminary result)   Collection Time: 11/28/18  6:14 PM  Result Value Ref Range Status   Specimen Description   Final    TOE Performed at [facility], [address], [place], [place] [address]    Special Requests   Final    RIGHT Performed at [facility], [address] [place], [place] [address]    Gram Stain NO WBC SEEN RARE GRAM POSITIVE COCCI IN PAIRS   Final   Culture   Final    TOO YOUNG TO READ Performed at [facility], [place] [address], [place], [place] [address]    Report Status PENDING  Incomplete  Surgical pcr screen     Status: None   Collection Time: 11/29/18  6:30 AM  Result Value Ref Range Status   MRSA, PCR NEGATIVE NEGATIVE Final   Staphylococcus aureus NEGATIVE NEGATIVE Final    Comment: (NOTE) The Xpert SA Assay (FDA approved for NASAL specimens in patients 46 years of age and older), is one component of a comprehensive surveillance program. It is not intended to diagnose infection nor to guide or monitor treatment. Performed at [facility], [address] [place], [place] [address]   Aerobic/Anaerobic Culture (surgical/deep wound)     Status: None (Preliminary result)   Collection Time: 11/29/18  8:37 AM  Result Value Ref Range Status   Specimen Description BONE RIGHT TOE GREAT  Final   Special Requests PATIENT ON FOLLOWING VANC ZOSYN CEFEPIME  Final   Gram Stain   Final    ABUNDANT WBC PRESENT, PREDOMINANTLY PMN FEW GRAM POSITIVE COCCI    Culture   Final    MODERATE UNIDENTIFIED ORGANISM IDENTIFICATION AND SUSCEPTIBILITIES TO FOLLOW Performed at [facility], [place] [address] [place], [place] [address]    Report Status PENDING  Incomplete    Studies/Results: Dg Foot Complete Right  Result Date: 11/28/2018 CLINICAL DATA:  Diabetic patient with a black great toe. EXAM: RIGHT FOOT COMPLETE - 3+ VIEW COMPARISON:  Plain films of the feet 06/14/2013.  FINDINGS: Extensive bony destructive change is seen centered about the first MTP joint consistent with septic joint and osteomyelitis. No other plain film evidence of osteomyelitis is seen. Soft tissues about the first MTP joint and great toe are markedly swollen. Extensive atherosclerosis is identified. 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 Severe soft tissue swelling about the toe. Atherosclerosis. Electronically Signed   By: Inge Rise M.D.   On: 11/28/2018 15:53    Assessment/Plan: Richard [name] is a 84  y.o. 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  He presented with acute changes over several days but tells me he had the wound for up to 6 months. He tells me he did not receive any antibiotics as an otpt. Now has had amputation of toe and part of 1st ray. Gram stain with GPC in pairs. MRSA PCR negative.   Recommendations Continue vanco and zosyn until cx results.  Currently growing unidentified organism Likely will need PICC and 2 weeks of IV abx followed by a tail of oral therapy. Picc ordered for today Thank you very much for the consult. Will follow with you.  Leonel Ramsay   11/30/2018, 10:30 AM

## 2018-11-30 NOTE — Progress Notes (Signed)
Per Dr Ola Spurr, this patient could potentially be a dialysis candidate.

## 2018-11-30 NOTE — NC FL2 (Signed)
[place] LEVEL OF CARE SCREENING TOOL     IDENTIFICATION  Patient Name: Richard [name] Birthdate: 11/01/1931 Sex: male Admission Date (Current Location): 11/28/2018  [place] and [place] Number:  Engineering geologist and Address:  [facility], [address], [place], [place] [address]      Provider Number: [id number]  Attending Physician Name and Address:  Dustin Flock, MD  Relative Name and Phone Number:       Current Level of Care: Hospital Recommended Level of Care: [place] Prior Approval Number:    Date Approved/Denied:   PASRR Number: [id number])  Discharge Plan: SNF    Current Diagnoses: Patient Active Problem List   Diagnosis Date Noted  . Pressure injury of skin 11/30/2018  . Acute osteomyelitis of toe of right foot ([place]) 11/28/2018  . Malignant tumor of prostate ([place]) 10/26/2015  . Urinary tract infection with hematuria 01/11/2015  . Bladder neck obstruction 11/04/2012  . 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 Prostate cancer ([place]) 11/04/2012  . Retention of urine 05/06/2012  . History of hematuria 01/30/2012  . Malignant neoplasm of prostate ([place]) 09/30/2011  . Gross hematuria 09/19/2011  . Benign prostatic hyperplasia with urinary retention 09/10/2011  . Elevated prostate specific antigen (PSA) 09/10/2011    Orientation RESPIRATION BLADDER Height & Weight     Self, Time, Situation, Place  Normal Continent Weight: 210 lb (95.3 kg) Height:  6' (182.9 cm)  BEHAVIORAL SYMPTOMS/MOOD NEUROLOGICAL BOWEL NUTRITION STATUS      Continent Diet(Diet: Carb Modified. )  AMBULATORY STATUS COMMUNICATION OF NEEDS Skin   Extensive Assist Verbally Surgical wounds, PU Stage and Appropriate Care(Incision: Right Foot. Pressure Ulcer Stage 2 on buttocks. )                       Personal Care Assistance Level of Assistance  Bathing, Feeding, Dressing Bathing Assistance: Limited assistance Feeding  assistance: Independent Dressing Assistance: Limited assistance     Functional Limitations Info  Sight, Hearing, Speech Sight Info: Adequate Hearing Info: Impaired Speech Info: Adequate    SPECIAL CARE FACTORS FREQUENCY  PT (By licensed PT), OT (By licensed OT)     PT Frequency: (5) OT Frequency: (5)            Contractures      Additional Factors Info  Code Status, Allergies Code Status Info: (DNR ) Allergies Info: (No Known Allergies. )           Current Medications (11/30/2018):  This is the current hospital active medication list Current Facility-Administered Medications  Medication Dose Route Frequency Provider Last Rate Last Dose  . 0.9 %  sodium chloride infusion   Intravenous Continuous Lance Coon, MD 0 mL/hr at 11/29/18 0600    . acetaminophen (TYLENOL) tablet 650 mg  650 mg Oral Q6H PRN Henreitta Leber, MD   650 mg at 11/29/18 2138   Or  . acetaminophen (TYLENOL) suppository 650 mg  650 mg Rectal Q6H PRN Henreitta Leber, MD      . ALPRAZolam Duanne Moron) tablet 0.25 mg  0.25 mg Oral QHS PRN Henreitta Leber, MD      . atorvastatin (LIPITOR) tablet 10 mg  10 mg Oral QPM Henreitta Leber, MD   10 mg at 11/29/18 1813  . cholecalciferol (VITAMIN D3) tablet 2,000 Units  2,000 Units Oral Daily Henreitta Leber, MD   2,000 Units at 11/30/18 0818  . colchicine tablet 0.6 mg  0.6  mg Oral Daily Henreitta Leber, MD   0.6 mg at 11/30/18 0818  . febuxostat (ULORIC) tablet 80 mg  80 mg Oral Daily Henreitta Leber, MD   80 mg at 11/30/18 1204  . hydrALAZINE (APRESOLINE) injection 10 mg  10 mg Intravenous Q6H PRN Henreitta Leber, MD   10 mg at 11/28/18 1731  . insulin aspart (novoLOG) injection 0-9 Units  0-9 Units Subcutaneous TID WC Dustin Flock, MD   2 Units at 11/30/18 1204  . lisinopril (PRINIVIL,ZESTRIL) tablet 5 mg  5 mg Oral Daily Henreitta Leber, MD   5 mg at 11/30/18 0818  . multivitamin with minerals tablet   Oral Daily Henreitta Leber, MD   1 tablet at  11/30/18 0818  . ondansetron (ZOFRAN) tablet 4 mg  4 mg Oral Q6H PRN Henreitta Leber, MD       Or  . ondansetron (ZOFRAN) injection 4 mg  4 mg Intravenous Q6H PRN Henreitta Leber, MD      . piperacillin-tazobactam (ZOSYN) IVPB 3.375 g  3.375 g Intravenous Q8H [place], Abby K, RPH 12.5 mL/hr at 11/30/18 0610 3.375 g at 11/30/18 0610  . vancomycin (VANCOCIN) 1,250 mg in sodium chloride 0.9 % 250 mL IVPB  1,250 mg Intravenous Q24H Rocky Morel, [facility]         Discharge Medications: Please see discharge summary for a list of discharge medications.  Relevant Imaging Results:  Relevant Lab Results:   Additional Information (SSN: [id number])  Kaisei Gilbo, Veronia Beets, LCSW

## 2018-11-30 NOTE — Clinical Social Work Note (Signed)
Clinical Social Work Assessment  Patient Details  Name: Richard [name] MRN: [id number] Date of Birth: [birth year]  Date of referral:  11/30/18               Reason for consult:  Facility Placement                Permission sought to share information with:    Permission granted to share information::     Name::        Agency::     Relationship::     Contact Information:     Housing/Transportation Living arrangements for the past 2 months:  Single Family Home Source of Information:  Patient, Adult Children Patient Interpreter Needed:  None Criminal Activity/Legal Involvement Pertinent to Current Situation/Hospitalization:  No - Comment as needed Significant Relationships:    Lives with:  Self Do you feel safe going back to the place where you live?  Yes Need for family participation in patient care:  Yes (Comment)  Care giving concerns:  Patient lives alone in [place] and his son Richard [name] lives close by.   Social Worker assessment / plan:  [place] representative ([place]) reviewed chart and noted that patient has a toe amputation and will likely need 2 weeks of IV ABX. CSW met with patient alone at bedside to discuss D/C plan. Patient was alert and oriented X4 and was laying in the bed. CSW introduced self and explained role of CSW department. Per patient he lives alone and his son Richard [name] lives near him. CSW explained SNF process and that medicare requires a 3 night qualifying inpatient stay in a hospital in order to pay for SNF. Patient was admitted to inpatient 11/28/18. Patient prefers to go home and stated that his son Richard [name] and can do his IV ABX. CSW contacted patient's son Richard [name] who confirmed this. Per Richard [name] patient lives close to patient and does not work so he can be with patient often. Per son he feels comfortable doing IV ABX at home for patient. RN case manager and [place] representative are aware of above. CSW will continue to follow and assist as needed.   Employment  status:  Retired Forensic scientist:  Medicare PT Recommendations:  Not assessed at this time [place] / Referral to community resources:  Other (Comment Required)(Patient prefers to D/C home. )  Patient/Family's Response to care:  Patient prefers to D/C home.   Patient/Family's Understanding of and Emotional Response to Diagnosis, Current Treatment, and Prognosis:  Patient and his son Richard [name] were very pleasant and thanked CSW for assistance.   Emotional Assessment Appearance:  Appears stated age Attitude/Demeanor/Rapport:    Affect (typically observed):  Accepting, Adaptable, Pleasant Orientation:  Oriented to Self, Oriented to Place, Oriented to Situation, Oriented to  Time Alcohol / Substance use:  Not Applicable Psych involvement (Current and /or in the community):  No (Comment)  Discharge Needs  Concerns to be addressed:  Discharge Planning Concerns Readmission within the last 30 days:  No Current discharge risk:  Dependent with Mobility, Chronically ill Barriers to Discharge:  Continued Medical Work up   [employer], Veronia Beets, LCSW 11/30/2018, 3:07 PM

## 2018-11-30 NOTE — Progress Notes (Signed)
Pharmacy Antibiotic Note  Richard [name] is a 84 y.o. male admitted on 11/28/2018 with sepsis. Patient with chronic wounds on right foot with X-ray showing osteomyelitis. Pharmacy has been consulted for vancomycin and Zosyn dosing. The patient received dose of cefepime and Flagyl in the ED.  Plan: Vancomycin 750 mg IV q12h to start tonight at 2300. Goal trough 15-20 mcg/ml. Trough ordered 12/16 at 1000. ke 0.032, t1/2 21.7 hr, VD 59.3 L  12/16: Vancomycin level at 1014 = 20 mcg/ml, not yet at steady state. Pt will likely accumulate with continued q12h dosing and also on Zosyn. Will change to vancomycin 1250 mg IV q24h and check trough before 4th dose. SCr with slight trend up as well.   Zosyn 3.375 g IV q8h (extended infusion) to start 12/15 at 0000  Height: 6' (182.9 cm) Weight: 210 lb (95.3 kg) IBW/kg (Calculated) : 77.6  Temp (24hrs), Avg:98.3 F (36.8 C), Min:98 F (36.7 C), Max:98.5 F (36.9 C)  Recent Labs  Lab 11/28/18 1502 11/28/18 1616 11/28/18 1921 11/29/18 0025 11/29/18 0354 11/30/18 1014  WBC 12.9*  --   --   --  10.8*  --   CREATININE 1.98*  --   --   --  1.80* 1.83*  LATICACIDVEN  --  2.2* 2.6* 1.2  --   --   VANCOTROUGH  --   --   --   --   --  20    Estimated Creatinine Clearance: 35.4 mL/min (A) (by C-G formula based on SCr of 1.83 mg/dL (H)).    No Known Allergies  Antimicrobials this admission: Cefepime 12/14 x 1 Flagyl 12/14 x 1 Vancomycin 12/14 >> Zosyn 12/15 >>  Dose adjustments this admission: 12/16: 750 mg q12h to 1250 mg q24h  Microbiology results: 12/14 BCx: pending  Thank you for allowing pharmacy to be a part of this patient's care.  Rayna Sexton, PharmD, BCPS Clinical Pharmacist 11/30/2018 12:25 PM

## 2018-11-30 NOTE — Consult Note (Addendum)
[place] Nurse wound consult note Reason for Consult: Consult requested for buttocks.  Podiatry team is following for assessment and plan of care for toe wound; refer to their team for further questions.  Pt states this lesion on the right buttock has been present "for awhile," and he had his physician assess the site prior to admission.  Wound type: It is unknown etiology; partial thickness.  Could possibly be related to eczema or previous shingles; pt states he had them in the past.  Pressure Injury POA: Appearance is NOT consistent with a pressure injury Measurement: 3X3X.1cm to right buttock Wound bed: dry red scaly; no odor, drainage, or fluctuance Periwound: Intact skin surrounding  Dressing procedure/placement/frequency: Foam dressing to protect from further injury. Discussed plan of care with patient and he denies further questions. Please re-consult if further assistance is needed.  Thank-you,  Julien Girt MSN, [place], [place], [place], [place]

## 2018-11-30 NOTE — Progress Notes (Signed)
[facility] Podiatry                                                      Patient Demographics  Richard [name], is a 84 y.o. male   MRN: [id number]   DOB - 08/13/1931  Admit Date - 11/28/2018    Outpatient Primary MD for the patient is [name], MD  Consult requested in the Hospital by Dustin Flock, MD, On    With History of -  Past Medical History:  Diagnosis Date  . Cancer ([place])   . Chronic kidney disease   . Colon polyp   . Diabetes mellitus   . History of blood clotting disorder       Past Surgical History:  Procedure Laterality Date  . AMPUTATION TOE Right 11/29/2018   Procedure: AMPUTATION TOE;  Surgeon: Albertine Patricia, DPM;  Location: [facility] ORS;  Service: Podiatry;  Laterality: Right;  . EYE SURGERY     CATARACTS  . HERNIA REPAIR    . SKIN CANCER EXCISION      in for   Chief Complaint  Patient presents with  . Blood Sugar Problem  . Foot Pain     HPI  Richard [name]  is a 84 y.o. male, 1 day status post first ray amputation due to gangrene and infection to the right first ray.    Review of Systems    In addition to the HPI above,  No Fever-chills, No Headache, No changes with Vision or hearing, No problems swallowing food or Liquids, No Chest pain, Cough or Shortness of Breath, No Abdominal pain, No Nausea or Vommitting, Bowel movements are regular, No Blood in stool or Urine, No dysuria, No new skin rashes or bruises, No new joints pains-aches,  No new weakness, tingling, numbness in any extremity, No recent weight gain or loss, No polyuria, polydypsia or polyphagia, No significant Mental Stressors.  A full 10 point Review of Systems was done, except as stated above, all other Review of Systems were negative.   Social History Social History   Tobacco Use  . Smoking status: Unknown If Ever Smoked  .  Smokeless tobacco: Never Used  Substance Use Topics  . Alcohol use: No    Family History History reviewed. No pertinent family history.  Prior to Admission medications   Medication Sig Start Date End Date Taking? Authorizing Provider  Febuxostat (ULORIC) 80 MG TABS Take 1 tablet by mouth daily.    Yes [provider]  glimepiride (AMARYL) 4 MG tablet Take 4 mg by mouth daily with breakfast.  06/15/17  Yes [provider]  lisinopril (PRINIVIL,ZESTRIL) 5 MG tablet Take 5 mg by mouth daily.     Yes [provider]  Red Yeast Rice 600 MG TABS Take 1 tablet by mouth daily.    Yes [provider]  tadalafil (CIALIS) 5 MG tablet Take 5 mg by mouth every other day.  05/21/17  Yes [provider]  warfarin (COUMADIN) 5 MG tablet Take 5 mg by mouth daily. Except MONDAYS and FRIDAYS.   Yes [provider]  warfarin (COUMADIN) 7.5 MG tablet Take 7.5 mg by mouth. On MONDAYS and FRIDAYS   Yes [provider]  Alpha-Lipoic Acid 200 MG CAPS Take by mouth.    [provider]  ALPRAZolam Duanne Moron) 0.25 MG tablet  Take 0.25 mg by mouth at bedtime as needed.      [provider]  atorvastatin (LIPITOR) 10 MG tablet Take 10 mg by mouth daily.      [provider]  Calcium Carbonate (CALCIUM 600 PO) Take by mouth.      [provider]  Cholecalciferol (VITAMIN D) 2000 UNITS tablet Take 2,000 Units by mouth daily.      [provider]  CIALIS 5 MG tablet TK 1/2 T PO QD 06/16/17   [provider]  Coenzyme Q10 10 MG capsule Take 10 mg by mouth.    [provider]  colchicine 0.6 MG tablet Take 0.6 mg by mouth.    [provider]  gentamicin cream (GARAMYCIN) 0.1 % Apply 1 application topically 3 (three) times daily. Patient not taking: Reported on 11/28/2018 06/16/18   Edrick Kins, DPM  glimepiride (AMARYL) 2 MG tablet Take 2 mg by mouth daily before breakfast.      [provider]  ibandronate (BONIVA) 150 MG tablet Take 150 mg by mouth every 30 (thirty) days. Take in the morning with a full glass of water, on an empty stomach, and do not take anything else by mouth or lie down for the next 30 min.     [provider]  Ibuprofen (ADVIL PO) Take by mouth.      [provider]  Multiple Vitamins-Minerals (MULTIVITAMIN ADULT EXTRA C PO) Take by mouth.    [provider]  Multiple Vitamins-Minerals (MULTIVITAMIN ADULT PO) Take by mouth.    [provider]  nystatin-triamcinolone (MYCOLOG II) cream Apply to affected areas bid 05/08/16   [provider]  tadalafil (CIALIS) 5 MG tablet TK 1/2 T PO QD 09/16/17   [provider]  Vitamins A & D (VITAMIN A & D) [address] UNITS TABS Take by mouth.      [provider]    Anti-infectives (From admission, onward)   Start     Dose/Rate Route Frequency Ordered Stop   11/30/18 1600  vancomycin (VANCOCIN) 1,250 mg in sodium chloride 0.9 % 250 mL IVPB     1,250 mg 166.7 mL/hr over 90 Minutes Intravenous Every 24 hours 11/30/18 1218     11/29/18 2200  piperacillin-tazobactam (ZOSYN) IVPB 3.375 g     3.375 g 12.5 mL/hr over 240 Minutes Intravenous Every 8 hours 11/28/18 1727     11/28/18 2300  vancomycin (VANCOCIN) IVPB 750 mg/150 ml premix  Status:  Discontinued     750 mg 150 mL/hr over 60 Minutes Intravenous Every 12 hours 11/28/18 1727 11/30/18 1218   11/28/18 1530  ceFEPIme (MAXIPIME) 2 g in sodium chloride 0.9 % 100 mL IVPB     2 g 200 mL/hr over 30 Minutes Intravenous  Once 11/28/18 1519 11/28/18 1658   11/28/18 1530  metroNIDAZOLE (FLAGYL) IVPB 500 mg  Status:  Discontinued     500 mg 100 mL/hr over 60 Minutes Intravenous Every 8 hours 11/28/18 1519 11/28/18 1727   11/28/18 1530  vancomycin (VANCOCIN) IVPB 1000 mg/200 mL premix     1,000 mg 200 mL/hr over 60 Minutes Intravenous  Once 11/28/18 1519 11/28/18 1801      Scheduled Meds: . atorvastatin  10 mg  Oral QPM  . cholecalciferol  2,000 Units Oral Daily  . colchicine  0.6 mg Oral Daily  . febuxostat  80 mg Oral Daily  . insulin aspart  0-9 Units Subcutaneous TID WC  . lisinopril  5  mg Oral Daily  . multivitamin with minerals   Oral Daily   Continuous Infusions: . sodium chloride 0 mL/hr at 11/29/18 0600  . piperacillin-tazobactam (ZOSYN)  IV 3.375 g (11/30/18 0610)  . vancomycin     PRN Meds:.acetaminophen **OR** acetaminophen, ALPRAZolam, hydrALAZINE, ondansetron **OR** ondansetron (ZOFRAN) IV  No Known Allergies  Physical Exam  Vitals  Blood pressure (!) 146/84, pulse 71, temperature 98 F (36.7 C), temperature source Oral, resp. rate 18, height 6' (1.829 m), weight 95.3 kg, SpO2 99 %.  Lower Extremity exam: Dressings dry clean and intact he is doing fairly well from overall standpoint with minimal pain.  White count is coming down  Data Review  CBC Recent Labs  Lab 11/28/18 1502 11/29/18 0354  WBC 12.9* 10.8*  HGB 13.6 11.3*  HCT 42.4 35.1*  PLT 302 262  MCV 95.3 94.1  MCH 30.6 30.3  MCHC 32.1 32.2  RDW 12.7 13.0  LYMPHSABS 2.1  --   MONOABS 1.2*  --   EOSABS 0.4  --   BASOSABS 0.0  --    ------------------------------------------------------------------------------------------------- Assessment & Plan: Leave dressing intact will change his dressing tomorrow and see how everything is looking but think he is progressing okay at this point.  Leave dressing intact today.  Still stay nonweightbearing on it.  Waiting for his INR to come down before he proceeds any type of vascular  surgery.  Active Problems:   Acute osteomyelitis of toe of right foot [facility])   Family Communication: Plan discussed with patient   Albertine Patricia M.D on 11/30/2018 at 12:37 PM  Thank you for the consult, we will follow the patient with you in the Hospital.

## 2018-11-30 NOTE — Progress Notes (Signed)
[facility]                                                                                                                                                                                  Patient Demographics   Richard [name], is a 84 y.o. male, DOB - 1931/03/22, ZLD:[id number]  Admit date - 11/28/2018   Admitting Physician Henreitta Leber, MD  Outpatient Primary MD for the patient is [name], MD   LOS - 2  Subjective: Currently denies any complaints  Review of Systems:   CONSTITUTIONAL: No documented fever. No fatigue, weakness. No weight gain, no weight loss.  EYES: No blurry or double vision.  ENT: No tinnitus. No postnasal drip. No redness of the oropharynx.  RESPIRATORY: No cough, no wheeze, no hemoptysis. No dyspnea.  CARDIOVASCULAR: No chest pain. No orthopnea. No palpitations. No syncope.  GASTROINTESTINAL: No nausea, no vomiting or diarrhea. No abdominal pain. No melena or hematochezia.  GENITOURINARY: No dysuria or hematuria.  ENDOCRINE: No polyuria or nocturia. No heat or cold intolerance.  HEMATOLOGY: No anemia. No bruising. No bleeding.  INTEGUMENTARY: No rashes. No lesions.  MUSCULOSKELETAL: No arthritis. No swelling. No gout.  NEUROLOGIC: No numbness, tingling, or ataxia. No seizure-type activity.  PSYCHIATRIC: No anxiety. No insomnia. No ADD.    Vitals:   Vitals:   11/29/18 1658 11/29/18 1917 11/29/18 2333 11/30/18 0749  BP: (!) 152/79 (!) 153/86 128/74 (!) 146/84  Pulse: 73 85 77 71  Resp:  19 19 18   Temp: 98.1 F (36.7 C) 98.4 F (36.9 C) 98.5 F (36.9 C) 98 F (36.7 C)  TempSrc: Oral Oral Oral Oral  SpO2: 99% 100% 98% 99%  Weight:      Height:        Wt Readings from Last 3 Encounters:  11/28/18 95.3 kg  05/11/18 101.2 kg     Intake/Output Summary (Last 24 hours) at 11/30/2018 1313 Last data filed at 11/30/2018 1023 Gross per 24 hour  Intake 790 ml  Output 1900 ml  Net -1110 ml    Physical  Exam:   GENERAL: Pleasant-appearing in no apparent distress.  HEAD, EYES, EARS, NOSE AND THROAT: Atraumatic, normocephalic. Extraocular muscles are intact. Pupils equal and reactive to light. Sclerae anicteric. No conjunctival injection. No oro-pharyngeal erythema.  NECK: Supple. There is no jugular venous distention. No bruits, no lymphadenopathy, no thyromegaly.  HEART: Regular rate and rhythm,. No murmurs, no rubs, no clicks.  LUNGS: Clear to auscultation bilaterally. No rales or rhonchi. No wheezes.  ABDOMEN: Soft, flat, nontender, nondistended. Has good bowel sounds. No hepatosplenomegaly appreciated.  EXTREMITIES: No evidence of any cyanosis, clubbing, or peripheral  edema.  +2 pedal and radial pulses bilaterally.  Right great toe dressing in place NEUROLOGIC: The patient is alert, awake, and oriented x3 with no focal motor or sensory deficits appreciated bilaterally.  SKIN: Moist and warm with no rashes appreciated.  Psych: Not anxious, depressed LN: No inguinal LN enlargement    Antibiotics   Anti-infectives (From admission, onward)   Start     Dose/Rate Route Frequency Ordered Stop   11/30/18 1600  vancomycin (VANCOCIN) 1,250 mg in sodium chloride 0.9 % 250 mL IVPB     1,250 mg 166.7 mL/hr over 90 Minutes Intravenous Every 24 hours 11/30/18 1218     11/29/18 2200  piperacillin-tazobactam (ZOSYN) IVPB 3.375 g     3.375 g 12.5 mL/hr over 240 Minutes Intravenous Every 8 hours 11/28/18 1727     11/28/18 2300  vancomycin (VANCOCIN) IVPB 750 mg/150 ml premix  Status:  Discontinued     750 mg 150 mL/hr over 60 Minutes Intravenous Every 12 hours 11/28/18 1727 11/30/18 1218   11/28/18 1530  ceFEPIme (MAXIPIME) 2 g in sodium chloride 0.9 % 100 mL IVPB     2 g 200 mL/hr over 30 Minutes Intravenous  Once 11/28/18 1519 11/28/18 1658   11/28/18 1530  metroNIDAZOLE (FLAGYL) IVPB 500 mg  Status:  Discontinued     500 mg 100 mL/hr over 60 Minutes Intravenous Every 8 hours 11/28/18 1519  11/28/18 1727   11/28/18 1530  vancomycin (VANCOCIN) IVPB 1000 mg/200 mL premix     1,000 mg 200 mL/hr over 60 Minutes Intravenous  Once 11/28/18 1519 11/28/18 1801      Medications   Scheduled Meds: . atorvastatin  10 mg Oral QPM  . cholecalciferol  2,000 Units Oral Daily  . colchicine  0.6 mg Oral Daily  . febuxostat  80 mg Oral Daily  . insulin aspart  0-9 Units Subcutaneous TID WC  . lisinopril  5 mg Oral Daily  . multivitamin with minerals   Oral Daily   Continuous Infusions: . sodium chloride 0 mL/hr at 11/29/18 0600  . piperacillin-tazobactam (ZOSYN)  IV 3.375 g (11/30/18 0610)  . vancomycin     PRN Meds:.acetaminophen **OR** acetaminophen, ALPRAZolam, hydrALAZINE, ondansetron **OR** ondansetron (ZOFRAN) IV   Data Review:   Micro Results Recent Results (from the past 240 hour(s))  Blood Culture (routine x 2)     Status: None (Preliminary result)   Collection Time: 11/28/18  4:15 PM  Result Value Ref Range Status   Specimen Description BLOOD R HAND  Final   Special Requests   Final    BOTTLES DRAWN AEROBIC AND ANAEROBIC Blood Culture results may not be optimal due to an excessive volume of blood received in culture bottles   Culture   Final    NO GROWTH 2 DAYS Performed at [facility], [address], [place], [place] [address]    Report Status PENDING  Incomplete  Blood Culture (routine x 2)     Status: None (Preliminary result)   Collection Time: 11/28/18  4:15 PM  Result Value Ref Range Status   Specimen Description BLOOD R WRIST  Final   Special Requests   Final    BOTTLES DRAWN AEROBIC AND ANAEROBIC Blood Culture results may not be optimal due to an excessive volume of blood received in culture bottles   Culture   Final    NO GROWTH 2 DAYS Performed at [facility], [address], [place], [place] [address]    Report Status PENDING  Incomplete  Aerobic/Anaerobic Culture (surgical/deep wound)     Status: None (Preliminary result)    Collection Time: 11/28/18  6:14 PM  Result Value Ref Range Status   Specimen Description   Final    TOE Performed at [facility], [address] [place], [place] [address]    Special Requests   Final    RIGHT Performed at [facility], [place], [place] [address]    Gram Stain NO WBC SEEN RARE GRAM POSITIVE COCCI IN PAIRS   Final   Culture   Final    MODERATE UNIDENTIFIED ORGANISM CULTURE REINCUBATED FOR BETTER GROWTH NO ANAEROBES ISOLATED; CULTURE IN PROGRESS FOR 5 DAYS    Report Status PENDING  Incomplete  Surgical pcr screen     Status: None   Collection Time: 11/29/18  6:30 AM  Result Value Ref Range Status   MRSA, PCR NEGATIVE NEGATIVE Final   Staphylococcus aureus NEGATIVE NEGATIVE Final    Comment: (NOTE) The Xpert SA Assay (FDA approved for NASAL specimens in patients 43 years of age and older), is one component of a comprehensive surveillance program. It is not intended to diagnose infection nor to guide or monitor treatment. Performed at [facility], [address] [place], [place] [address]   Aerobic/Anaerobic Culture (surgical/deep wound)     Status: None (Preliminary result)   Collection Time: 11/29/18  8:37 AM  Result Value Ref Range Status   Specimen Description BONE RIGHT TOE GREAT  Final   Special Requests PATIENT ON FOLLOWING VANC ZOSYN CEFEPIME  Final   Gram Stain   Final    ABUNDANT WBC PRESENT, PREDOMINANTLY PMN FEW GRAM POSITIVE COCCI    Culture   Final    MODERATE UNIDENTIFIED ORGANISM IDENTIFICATION AND SUSCEPTIBILITIES TO FOLLOW Performed at [facility], [place] [address], [place], [place] [address]    Report Status PENDING  Incomplete    Radiology Reports Dg Foot Complete Right  Result Date: 11/28/2018 CLINICAL DATA:  Diabetic patient with a black great toe. EXAM: RIGHT FOOT COMPLETE - 3+ VIEW COMPARISON:  Plain films of the feet 06/14/2013. FINDINGS: Extensive bony destructive change  is seen centered about the first MTP joint consistent with septic joint and osteomyelitis. No other plain film evidence of osteomyelitis is seen. Soft tissues about the first MTP joint and great toe are markedly swollen. Extensive atherosclerosis is identified. 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 Severe soft tissue swelling about the toe. Atherosclerosis. Electronically Signed   By: Inge Rise M.D.   On: 11/28/2018 15:53   [place] Ekg Site Rite  Result Date: 11/30/2018 If Site Rite image not attached, placement could not be confirmed due to current cardiac rhythm.    CBC Recent Labs  Lab 11/28/18 1502 11/29/18 0354  WBC 12.9* 10.8*  HGB 13.6 11.3*  HCT 42.4 35.1*  PLT 302 262  MCV 95.3 94.1  MCH 30.6 30.3  MCHC 32.1 32.2  RDW 12.7 13.0  LYMPHSABS 2.1  --   MONOABS 1.2*  --   EOSABS 0.4  --   BASOSABS 0.0  --     Chemistries  Recent Labs  Lab 11/28/18 1502 11/29/18 0354 11/30/18 1014  NA 136 136  --   K 4.8 4.2  --   CL 106 107  --   CO2 22 20*  --   GLUCOSE 200* 187*  --  BUN 39* 38*  --   CREATININE 1.98* 1.80* 1.83*  CALCIUM 8.6* 8.0*  --    ------------------------------------------------------------------------------------------------------------------ estimated creatinine clearance is 35.4 mL/min (A) (by C-G formula based on SCr of 1.83 mg/dL (H)). ------------------------------------------------------------------------------------------------------------------ No results for input(s): HGBA1C in the last 72 hours. ------------------------------------------------------------------------------------------------------------------ No results for input(s): CHOL, HDL, LDLCALC, TRIG, CHOLHDL, LDLDIRECT in the last 72  hours. ------------------------------------------------------------------------------------------------------------------ No results for input(s): TSH, T4TOTAL, T3FREE, THYROIDAB in the last 72 hours.  Invalid input(s): FREET3 ------------------------------------------------------------------------------------------------------------------ No results for input(s): VITAMINB12, FOLATE, FERRITIN, TIBC, IRON, RETICCTPCT in the last 72 hours.  Coagulation profile Recent Labs  Lab 11/28/18 1502 11/29/18 0634 11/30/18 0350  INR 3.95 4.40* 3.87    No results for input(s): DDIMER in the last 72 hours.  Cardiac Enzymes No results for input(s): CKMB, TROPONINI, MYOGLOBIN in the last 168 hours.  Invalid input(s): CK ------------------------------------------------------------------------------------------------------------------ Invalid input(s): POCBNP    Assessment & Plan   84 year old male with past medical history of diabetes, hypertension, chronic kidney disease, prostate cancer who presents to the hospital due to right foot pain and swelling and also a necrotic appearing right great toe.  1.  Right foot/great toe osteomyelitis/necrosis-  Appreciate podiatry input status post amputation of the gangrene right toe Continue IV antibiotics Continue current wound care Plan for PICC line later today per ID Await wound culture results Plan for arteriogram on Wednesday to evaluate for peripheral vascular disease   2.  Diabetes type 2 with neuropathy- patient currently not on sliding scale I will place him on sliding scale  Resume Amaryl   3.  Essential hypertension-continue lisinopril.  4.  History of gout-no acute attack.  Continue Uloric.  5.  Hyperlipidemia-continue atorvastatin.  6.  Anxiety-continue Xanax.  7.  History of recurrent pulmonary embolism due to factor V Leiden mutation- INR continues to be elevated once INR below 2.5 heparin can be initiated    Code  Status Orders  (From admission, onward)         Start     Ordered   11/29/18 1202  Do not attempt resuscitation (DNR)  Continuous    Question Answer Comment  In the event of cardiac or respiratory ARREST Do not call a "code blue"   In the event of cardiac or respiratory ARREST Do not perform Intubation, CPR, defibrillation or ACLS   In the event of cardiac or respiratory ARREST Use medication by any route, position, wound care, and other measures to relive pain and suffering. May use oxygen, suction and manual treatment of airway obstruction as needed for comfort.      11/29/18 1201        Code Status History    Date Active Date Inactive Code Status Order ID Comments User Context   11/28/2018 1800 11/29/2018 1201 Full Code [id number]  Henreitta Leber, MD Inpatient           Consults podiatry DVT Prophylaxis Coumadin  Lab Results  Component Value Date   PLT 262 11/29/2018     Time Spent in minutes   35 minutes  Greater than 50% of time spent in care coordination and counseling patient regarding the condition and plan of care.   Dustin Flock M.D on 11/30/2018 at 1:13 PM  Between 7am to 6pm - Pager - [phone]  After 6pm go to [email or web address] - Proofreader  [employer]   Office  [phone]

## 2018-11-30 NOTE — Consult Note (Signed)
[facility] Vascular Consult Note  MRN : [id number]  Richard [name] is a 84 y.o. (Apr 17, 1931) male who presents with chief complaint of  Chief Complaint  Patient presents with  . Blood Sugar Problem  . Foot Pain   History of Present Illness:  The patient is an 84 year old male with a past medical history of clotting disorder on chronic anticoagulation with Coumadin, diabetes, chronic kidney disease, prostate cancer, who presented to the [facility] emergency department on November 28, 2018 with a chief complaint of a "black and foul-smelling" great right toe located to the right foot.  The patient endorses a history of stepping on a sharp object a few months ago which caused an injury to the bottom of his foot.  The patient has been under the care of a podiatrist ([facility]).  The patient noted after a bandage being removed that his toe was "black with a foul smell".  This prompted him to seek medical attention at our emergency department.  The patient does experience right foot pain however denies any claudication-like symptoms or rest pain.  The patient denies any fever, nausea or vomiting.  The patient was also seen by podiatry and taken to the OR on 11/29/18 and underwent a first ray amputation right foot to alleviate the gangrene and remove the infected bone and soft tissue.  Vascular surgery was consulted by Dr. Elvina Mattes for evaluation and possible endovascular intervention.  Current Facility-Administered Medications  Medication Dose Route Frequency Provider Last Rate Last Dose  . 0.9 %  sodium chloride infusion   Intravenous Continuous Lance Coon, MD 0 mL/hr at 11/29/18 0600    . acetaminophen (TYLENOL) tablet 650 mg  650 mg Oral Q6H PRN Henreitta Leber, MD   650 mg at 11/29/18 2138   Or  . acetaminophen (TYLENOL) suppository 650 mg  650 mg Rectal Q6H PRN Henreitta Leber, MD      . ALPRAZolam Duanne Moron) tablet 0.25 mg  0.25 mg  Oral QHS PRN Henreitta Leber, MD      . atorvastatin (LIPITOR) tablet 10 mg  10 mg Oral QPM Henreitta Leber, MD   10 mg at 11/29/18 1813  . cholecalciferol (VITAMIN D3) tablet 2,000 Units  2,000 Units Oral Daily Henreitta Leber, MD   2,000 Units at 11/30/18 0818  . colchicine tablet 0.6 mg  0.6 mg Oral Daily Henreitta Leber, MD   0.6 mg at 11/30/18 0818  . febuxostat (ULORIC) tablet 80 mg  80 mg Oral Daily Sainani, Belia Heman, MD      . hydrALAZINE (APRESOLINE) injection 10 mg  10 mg Intravenous Q6H PRN Henreitta Leber, MD   10 mg at 11/28/18 1731  . insulin aspart (novoLOG) injection 0-9 Units  0-9 Units Subcutaneous TID WC Dustin Flock, MD   2 Units at 11/30/18 [phone]  . lisinopril (PRINIVIL,ZESTRIL) tablet 5 mg  5 mg Oral Daily Henreitta Leber, MD   5 mg at 11/30/18 0818  . multivitamin with minerals tablet   Oral Daily Henreitta Leber, MD   1 tablet at 11/30/18 0818  . ondansetron (ZOFRAN) tablet 4 mg  4 mg Oral Q6H PRN Henreitta Leber, MD       Or  . ondansetron (ZOFRAN) injection 4 mg  4 mg Intravenous Q6H PRN Sainani, Belia Heman, MD      . piperacillin-tazobactam (ZOSYN) IVPB 3.375 g  3.375 g Intravenous Q8H Ellington, Abby K, RPH 12.5 mL/hr  at 11/30/18 0610 3.375 g at 11/30/18 0610  . vancomycin (VANCOCIN) IVPB 750 mg/150 ml premix  750 mg Intravenous Q12H Tawnya Crook, RPH 150 mL/hr at 11/30/18 0045 750 mg at 11/30/18 0045   Past Medical History:  Diagnosis Date  . Cancer ([place])   . Chronic kidney disease   . Colon polyp   . Diabetes mellitus   . History of blood clotting disorder    Past Surgical History:  Procedure Laterality Date  . AMPUTATION TOE Right 11/29/2018   Procedure: AMPUTATION TOE;  Surgeon: Albertine Patricia, DPM;  Location: [facility] ORS;  Service: Podiatry;  Laterality: Right;  . EYE SURGERY     CATARACTS  . HERNIA REPAIR    . SKIN CANCER EXCISION     Social History Social History   Tobacco Use  . Smoking status: Unknown If Ever Smoked  . Smokeless  tobacco: Never Used  Substance Use Topics  . Alcohol use: No  . Drug use: Not on file   Family History History reviewed. No pertinent family history.  Patient denies any family history of peripheral artery disease, venous disease or bleeding/clotting disorders.  No Known Allergies  REVIEW OF SYSTEMS (Negative unless checked)  Constitutional: [] Weight loss  [] Fever  [] Chills Cardiac: [] Chest pain   [] Chest pressure   [] Palpitations   [] Shortness of breath when laying flat   [] Shortness of breath at rest   [] Shortness of breath with exertion. Vascular:  [] Pain in legs with walking   [] Pain in legs at rest   [] Pain in legs when laying flat   [] Claudication   [x] Pain in feet when walking  [x] Pain in feet at rest  [x] Pain in feet when laying flat   [] History of DVT   [] Phlebitis   [] Swelling in legs   [] Varicose veins   [x] Non-healing ulcers Pulmonary:   [] Uses home oxygen   [] Productive cough   [] Hemoptysis   [] Wheeze  [] COPD   [] Asthma Neurologic:  [] Dizziness  [] Blackouts   [] Seizures   [] History of stroke   [] History of TIA  [] Aphasia   [] Temporary blindness   [] Dysphagia   [] Weakness or numbness in arms   [] Weakness or numbness in legs Musculoskeletal:  [] Arthritis   [] Joint swelling   [] Joint pain   [] Low back pain Hematologic:  [] Easy bruising  [] Easy bleeding   [] Hypercoagulable state   [] Anemic  [] Hepatitis Gastrointestinal:  [] Blood in stool   [] Vomiting blood  [] Gastroesophageal reflux/heartburn   [] Difficulty swallowing. Genitourinary:  [x] Chronic kidney disease   [x] Difficult urination  [] Frequent urination  [] Burning with urination   [x] Blood in urine Skin:  [] Rashes   [] Ulcers   [] Wounds Psychological:  [] History of anxiety   []  History of major depression.  Physical Examination  Vitals:   11/29/18 1658 11/29/18 1917 11/29/18 2333 11/30/18 0749  BP: (!) 152/79 (!) 153/86 128/74 (!) 146/84  Pulse: 73 85 77 71  Resp:  19 19 18   Temp: 98.1 F (36.7 C) 98.4 F (36.9 C) 98.5 F  (36.9 C) 98 F (36.7 C)  TempSrc: Oral Oral Oral Oral  SpO2: 99% 100% 98% 99%  Weight:      Height:       Body mass index is 28.48 kg/m. Gen:  WD/WN, NAD Head: [place]/AT, No temporalis wasting. Prominent temp pulse not noted. Ear/Nose/Throat: Hearing grossly intact, nares w/o erythema or drainage, oropharynx w/o Erythema/Exudate Eyes: Sclera non-icteric, conjunctiva clear Neck: Trachea midline.  No JVD.  Pulmonary:  Good air movement, respirations not labored, equal bilaterally.  Cardiac: RRR, normal S1, S2. Vascular:  Vessel Right Left  Radial Palpable Palpable  Ulnar Palpable Palpable  Brachial Palpable Palpable  Carotid Palpable, without bruit Palpable, without bruit  Aorta Not palpable N/A  Femoral Palpable Palpable  Popliteal Palpable Palpable  PT Non-Palpable Non-Palpable  DP Non-Palpable Non-Palpable   Gastrointestinal: soft, non-tender/non-distended. No guarding/reflex.  Musculoskeletal: M/S 5/5 throughout.  Extremities without ischemic changes.  No deformity or atrophy. Minimal edema. Neurologic: Sensation grossly intact in extremities.  Symmetrical.  Speech is fluent. Motor exam as listed above. Psychiatric: Judgment intact, Mood & affect appropriate for pt's clinical situation. Dermatologic:   Right Lower Extremity: Or dressing in place. No cellulitis noted.  Lymph : No Cervical, Axillary, or Inguinal lymphadenopathy.  CBC Lab Results  Component Value Date   WBC 10.8 (H) 11/29/2018   HGB 11.3 (L) 11/29/2018   HCT 35.1 (L) 11/29/2018   MCV 94.1 11/29/2018   PLT 262 11/29/2018   BMET    Component Value Date/Time   NA 136 11/29/2018 0354   K 4.2 11/29/2018 0354   CL 107 11/29/2018 0354   CO2 20 (L) 11/29/2018 0354   GLUCOSE 187 (H) 11/29/2018 0354   BUN 38 (H) 11/29/2018 0354   CREATININE 1.80 (H) 11/29/2018 0354   CALCIUM 8.0 (L) 11/29/2018 0354   GFRNONAA 34 (L) 11/29/2018 0354   GFRAA 39 (L) 11/29/2018 0354   Estimated Creatinine Clearance: 35.9  mL/min (A) (by C-G formula based on SCr of 1.8 mg/dL (H)).  COAG Lab Results  Component Value Date   INR 3.87 11/30/2018   INR 4.40 (HH) 11/29/2018   INR 3.95 11/28/2018   Radiology Dg Foot Complete Right  Result Date: 11/28/2018 CLINICAL DATA:  Diabetic patient with a black great toe. EXAM: RIGHT FOOT COMPLETE - 3+ VIEW COMPARISON:  Plain films of the feet 06/14/2013. FINDINGS: Extensive bony destructive change is seen centered about the first MTP joint consistent with septic joint and osteomyelitis. No other plain film evidence of osteomyelitis is seen. Soft tissues about the first MTP joint and great toe are markedly swollen. Extensive atherosclerosis is identified. 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 Severe soft tissue swelling about the toe. Atherosclerosis. Electronically Signed   By: Inge Rise M.D.   On: 11/28/2018 15:53   [place] Ekg Site Rite  Result Date: 11/30/2018 If Site Rite image not attached, placement could not be confirmed due to current cardiac rhythm.  Assessment/Plan The patient is an 84 year old male with a past medical history of clotting disorder on chronic anticoagulation with Coumadin, diabetes, chronic kidney disease, prostate cancer, who presented to the [facility] emergency department on November 28, 2018 with a chief complaint of a "black and foul-smelling" great right toe located to the right foot.  1. Peripheral Artery Disease: Patient with multiple risk factors for peripheral artery disease.  Hard to palpate pedal pulses to the bilateral feet.  Patient with slow healing ulceration and gangrenous changes to the right first big toe which has now been removed by podiatry.  Recommend a right lower extremity angiogram with possible intervention to assess the patient's anatomy and degree the peripheral  artery disease.  If any disease is found an attempt at that time can be made to revascularize the leg.  Procedure, risks and benefits explained to the patient.  All questions answered.  The patient wishes to proceed.  The patient's INR  is 3.87.  At this time, we are unable to proceed with such a high INR.  We will be able to proceed with a right lower extremity angiogram if the patient's INR is 3 or below.  As of now, we will plan to proceed as of Wednesday however if the patient's INR is still elevated we will have to wait. 2. Diabetes: On appropriate medications. Encouraged good control as its slows the progression of atherosclerotic disease 3. CKD: Encouraged good hypertension control as this slow the progression of kidney disease  Discussed with Dr. Mayme Genta, PA-C  11/30/2018 10:49 AM  This note was created with Dragon medical transcription system.  Any error is purely unintentional.

## 2018-12-01 ENCOUNTER — Other Ambulatory Visit (INDEPENDENT_AMBULATORY_CARE_PROVIDER_SITE_OTHER): Payer: Self-pay | Admitting: Vascular Surgery

## 2018-12-01 LAB — GLUCOSE, CAPILLARY
Glucose-Capillary: 169 mg/dL — ABNORMAL HIGH (ref 70–99)
Glucose-Capillary: 188 mg/dL — ABNORMAL HIGH (ref 70–99)
Glucose-Capillary: 181 mg/dL — ABNORMAL HIGH (ref 70–99)
Glucose-Capillary: 196 mg/dL — ABNORMAL HIGH (ref 70–99)

## 2018-12-01 LAB — CBC
HCT: 33.3 % — ABNORMAL LOW (ref 39.0–52.0)
Hemoglobin: 10.6 g/dL — ABNORMAL LOW (ref 13.0–17.0)
MCH: 30.7 pg (ref 26.0–34.0)
MCHC: 31.8 g/dL (ref 30.0–36.0)
MCV: 96.5 fL (ref 80.0–100.0)
Platelets: 237 10*3/uL (ref 150–400)
RBC: 3.45 MIL/uL — ABNORMAL LOW (ref 4.22–5.81)
RDW: 13 % (ref 11.5–15.5)
WBC: 9.5 10*3/uL (ref 4.0–10.5)
nRBC: 0 % (ref 0.0–0.2)

## 2018-12-01 LAB — SURGICAL PATHOLOGY

## 2018-12-01 LAB — PROTIME-INR
INR: 3.15
Prothrombin Time: 31.9 seconds — ABNORMAL HIGH (ref 11.4–15.2)

## 2018-12-01 LAB — BASIC METABOLIC PANEL
Anion gap: 6 (ref 5–15)
BUN: 37 mg/dL — ABNORMAL HIGH (ref 8–23)
CO2: 21 mmol/L — ABNORMAL LOW (ref 22–32)
Calcium: 7.6 mg/dL — ABNORMAL LOW (ref 8.9–10.3)
Chloride: 107 mmol/L (ref 98–111)
Creatinine, Ser: 1.78 mg/dL — ABNORMAL HIGH (ref 0.61–1.24)
GFR calc Af Amer: 39 mL/min — ABNORMAL LOW (ref >60)
GFR calc non Af Amer: 34 mL/min — ABNORMAL LOW (ref >60)
Glucose, Bld: 192 mg/dL — ABNORMAL HIGH (ref 70–99)
Potassium: 4.1 mmol/L (ref 3.5–5.1)
Sodium: 134 mmol/L — ABNORMAL LOW (ref 135–145)

## 2018-12-01 MED ORDER — LISINOPRIL 10 MG PO TABS
10.0000 mg | ORAL_TABLET | Freq: Every day | ORAL | Status: DC
  Administered 2018-12-02 – 2018-12-04 (×3): 10 mg via ORAL
  Filled 2018-12-01 (×3): qty 1

## 2018-12-01 MED ORDER — SODIUM CHLORIDE 0.9 % IV SOLN
INTRAVENOUS | Status: DC
  Administered 2018-12-02: 01:00:00 via INTRAVENOUS

## 2018-12-01 NOTE — Progress Notes (Signed)
[place] at [facility]                                                                                                                                                                                  Patient Demographics   Richard [name], is a 84 y.o. male, DOB - Sep 26, 1931, LZJ:[id number]  Admit date - 11/28/2018   Admitting Physician Henreitta Leber, MD  Outpatient Primary MD for the patient is [name], MD   LOS - 3  Subjective: Patient is feeling well awaiting angiogram tomorrow  Review of Systems:   CONSTITUTIONAL: No documented fever. No fatigue, weakness. No weight gain, no weight loss.  EYES: No blurry or double vision.  ENT: No tinnitus. No postnasal drip. No redness of the oropharynx.  RESPIRATORY: No cough, no wheeze, no hemoptysis. No dyspnea.  CARDIOVASCULAR: No chest pain. No orthopnea. No palpitations. No syncope.  GASTROINTESTINAL: No nausea, no vomiting or diarrhea. No abdominal pain. No melena or hematochezia.  GENITOURINARY: No dysuria or hematuria.  ENDOCRINE: No polyuria or nocturia. No heat or cold intolerance.  HEMATOLOGY: No anemia. No bruising. No bleeding.  INTEGUMENTARY: No rashes. No lesions.  MUSCULOSKELETAL: No arthritis. No swelling. No gout.  NEUROLOGIC: No numbness, tingling, or ataxia. No seizure-type activity.  PSYCHIATRIC: No anxiety. No insomnia. No ADD.    Vitals:   Vitals:   11/29/18 2333 11/30/18 0749 12/01/18 0022 12/01/18 0803  BP: 128/74 (!) 146/84 (!) 156/83 (!) 171/74  Pulse: 77 71 66 63  Resp: 19 18 18 20   Temp: 98.5 F (36.9 C) 98 F (36.7 C) 98.4 F (36.9 C) 98.1 F (36.7 C)  TempSrc: Oral Oral Oral Oral  SpO2: 98% 99% 94% 98%  Weight:      Height:        Wt Readings from Last 3 Encounters:  11/28/18 95.3 kg  05/11/18 101.2 kg     Intake/Output Summary (Last 24 hours) at 12/01/2018 1149 Last data filed at 12/01/2018 1055 Gross per 24 hour  Intake 1360.66 ml  Output 1775 ml   Net -414.34 ml    Physical Exam:   GENERAL: Pleasant-appearing in no apparent distress.  HEAD, EYES, EARS, NOSE AND THROAT: Atraumatic, normocephalic. Extraocular muscles are intact. Pupils equal and reactive to light. Sclerae anicteric. No conjunctival injection. No oro-pharyngeal erythema.  NECK: Supple. There is no jugular venous distention. No bruits, no lymphadenopathy, no thyromegaly.  HEART: Regular rate and rhythm,. No murmurs, no rubs, no clicks.  LUNGS: Clear to auscultation bilaterally. No rales or rhonchi. No wheezes.  ABDOMEN: Soft, flat, nontender, nondistended. Has good bowel sounds. No hepatosplenomegaly appreciated.  EXTREMITIES: No evidence of any cyanosis,  clubbing, or peripheral edema.  +2 pedal and radial pulses bilaterally.  Right great toe dressing in place NEUROLOGIC: The patient is alert, awake, and oriented x3 with no focal motor or sensory deficits appreciated bilaterally.  SKIN: Moist and warm with no rashes appreciated.  Psych: Not anxious, depressed LN: No inguinal LN enlargement    Antibiotics   Anti-infectives (From admission, onward)   Start     Dose/Rate Route Frequency Ordered Stop   11/30/18 1600  vancomycin (VANCOCIN) 1,250 mg in sodium chloride 0.9 % 250 mL IVPB  Status:  Discontinued     1,250 mg 166.7 mL/hr over 90 Minutes Intravenous Every 24 hours 11/30/18 1218 12/01/18 1015   11/29/18 2200  piperacillin-tazobactam (ZOSYN) IVPB 3.375 g     3.375 g 12.5 mL/hr over 240 Minutes Intravenous Every 8 hours 11/28/18 1727     11/28/18 2300  vancomycin (VANCOCIN) IVPB 750 mg/150 ml premix  Status:  Discontinued     750 mg 150 mL/hr over 60 Minutes Intravenous Every 12 hours 11/28/18 1727 11/30/18 1218   11/28/18 1530  ceFEPIme (MAXIPIME) 2 g in sodium chloride 0.9 % 100 mL IVPB     2 g 200 mL/hr over 30 Minutes Intravenous  Once 11/28/18 1519 11/28/18 1658   11/28/18 1530  metroNIDAZOLE (FLAGYL) IVPB 500 mg  Status:  Discontinued     500 mg 100  mL/hr over 60 Minutes Intravenous Every 8 hours 11/28/18 1519 11/28/18 1727   11/28/18 1530  vancomycin (VANCOCIN) IVPB 1000 mg/200 mL premix     1,000 mg 200 mL/hr over 60 Minutes Intravenous  Once 11/28/18 1519 11/28/18 1801      Medications   Scheduled Meds: . atorvastatin  10 mg Oral QPM  . cholecalciferol  2,000 Units Oral Daily  . colchicine  0.6 mg Oral Daily  . febuxostat  80 mg Oral Daily  . insulin aspart  0-9 Units Subcutaneous TID WC  . [START ON 12/02/2018] lisinopril  10 mg Oral Daily  . multivitamin with minerals   Oral Daily  . sodium chloride flush  10-40 mL Intracatheter Q12H   Continuous Infusions: . piperacillin-tazobactam (ZOSYN)  IV 3.375 g (12/01/18 0527)   PRN Meds:.acetaminophen **OR** acetaminophen, ALPRAZolam, hydrALAZINE, ondansetron **OR** ondansetron (ZOFRAN) IV, sodium chloride flush   Data Review:   Micro Results Recent Results (from the past 240 hour(s))  Blood Culture (routine x 2)     Status: None (Preliminary result)   Collection Time: 11/28/18  4:15 PM  Result Value Ref Range Status   Specimen Description BLOOD R HAND  Final   Special Requests   Final    BOTTLES DRAWN AEROBIC AND ANAEROBIC Blood Culture results may not be optimal due to an excessive volume of blood received in culture bottles   Culture   Final    NO GROWTH 3 DAYS Performed at [facility], [address], [place], [place] [address]    Report Status PENDING  Incomplete  Blood Culture (routine x 2)     Status: None (Preliminary result)   Collection Time: 11/28/18  4:15 PM  Result Value Ref Range Status   Specimen Description BLOOD R WRIST  Final   Special Requests   Final    BOTTLES DRAWN AEROBIC AND ANAEROBIC Blood Culture results may not be optimal due to an excessive volume of blood received in culture bottles   Culture   Final    NO GROWTH 3 DAYS Performed at [facility], [place],  [place], [place] [address]    Report Status PENDING   Incomplete  Aerobic/Anaerobic Culture (surgical/deep wound)     Status: None (Preliminary result)   Collection Time: 11/28/18  6:14 PM  Result Value Ref Range Status   Specimen Description   Final    TOE Performed at [facility], [address], [place], [place] [address]    Special Requests   Final    RIGHT Performed at [facility] [place], [place], [place] [address]    Gram Stain NO WBC SEEN RARE GRAM POSITIVE COCCI IN PAIRS   Final   Culture   Final    MODERATE STAPHYLOCOCCUS SPECIES (COAGULASE NEGATIVE) MODERATE ENTEROCOCCUS FAECALIS NO ANAEROBES ISOLATED; CULTURE IN PROGRESS FOR 5 DAYS    Report Status PENDING  Incomplete   Organism ID, Bacteria STAPHYLOCOCCUS SPECIES (COAGULASE NEGATIVE)  Final   Organism ID, Bacteria ENTEROCOCCUS FAECALIS  Final      Susceptibility   Enterococcus faecalis - MIC*    AMPICILLIN <=2 SENSITIVE Sensitive     VANCOMYCIN <=0.5 SENSITIVE Sensitive     GENTAMICIN SYNERGY RESISTANT Resistant     * MODERATE ENTEROCOCCUS FAECALIS   Staphylococcus species (coagulase negative) - MIC*    CIPROFLOXACIN <=0.5 SENSITIVE Sensitive     ERYTHROMYCIN 4 INTERMEDIATE Intermediate     GENTAMICIN >=16 RESISTANT Resistant     OXACILLIN <=0.25 SENSITIVE Sensitive     TETRACYCLINE <=1 SENSITIVE Sensitive     VANCOMYCIN <=0.5 SENSITIVE Sensitive     TRIMETH/SULFA <=10 SENSITIVE Sensitive     CLINDAMYCIN <=0.25 SENSITIVE Sensitive     RIFAMPIN <=0.5 SENSITIVE Sensitive     Inducible Clindamycin NEGATIVE Sensitive     * MODERATE STAPHYLOCOCCUS SPECIES (COAGULASE NEGATIVE)  Surgical pcr screen     Status: None   Collection Time: 11/29/18  6:30 AM  Result Value Ref Range Status   MRSA, PCR NEGATIVE NEGATIVE Final   Staphylococcus aureus NEGATIVE NEGATIVE Final    Comment: (NOTE) The Xpert SA Assay (FDA approved for NASAL specimens in patients 85 years of age and older), is one component of a comprehensive surveillance program. It  is not intended to diagnose infection nor to guide or monitor treatment. Performed at [facility], [address] [place], [place] [address]   Aerobic/Anaerobic Culture (surgical/deep wound)     Status: None (Preliminary result)   Collection Time: 11/29/18  8:37 AM  Result Value Ref Range Status   Specimen Description BONE RIGHT TOE GREAT  Final   Special Requests PATIENT ON FOLLOWING VANC ZOSYN CEFEPIME  Final   Gram Stain   Final    ABUNDANT WBC PRESENT, PREDOMINANTLY PMN FEW GRAM POSITIVE COCCI Performed at [facility], [place] [address], [place], [place] [address]    Culture   Final    MODERATE STAPHYLOCOCCUS SPECIES (COAGULASE NEGATIVE) MODERATE UNIDENTIFIED ORGANISM CULTURE REINCUBATED FOR BETTER GROWTH NO ANAEROBES ISOLATED; CULTURE IN PROGRESS FOR 5 DAYS    Report Status PENDING  Incomplete   Organism ID, Bacteria STAPHYLOCOCCUS SPECIES (COAGULASE NEGATIVE)  Final      Susceptibility   Staphylococcus species (coagulase negative) - MIC*    CIPROFLOXACIN <=0.5 SENSITIVE Sensitive     ERYTHROMYCIN 1 INTERMEDIATE Intermediate     GENTAMICIN >=16 RESISTANT Resistant     OXACILLIN <=0.25 SENSITIVE Sensitive     TETRACYCLINE <=1 SENSITIVE Sensitive     VANCOMYCIN <=0.5 SENSITIVE Sensitive     TRIMETH/SULFA <=10 SENSITIVE Sensitive     CLINDAMYCIN <=0.25 SENSITIVE  Sensitive     RIFAMPIN <=0.5 SENSITIVE Sensitive     Inducible Clindamycin NEGATIVE Sensitive     * MODERATE STAPHYLOCOCCUS SPECIES (COAGULASE NEGATIVE)    Radiology Reports Dg Chest Port 1 View  Result Date: 11/30/2018 CLINICAL DATA:  84 y/o  M; PICC line placement. History of diabetes. EXAM: PORTABLE CHEST 1 VIEW COMPARISON:  None. FINDINGS: Normal cardiac silhouette given projection and technique. Right PICC line tip projects over the lower SVC. Coarse reticular opacities within the lower lungs greatest in the left mid lung zone, probably chronic fibrotic and bronchitic changes. Underlying  pneumonitis is possible. No pleural effusion or pneumothorax. Bones are unremarkable. IMPRESSION: 1. Right PICC line tip projects over lower SVC. 2. Coarse reticular opacities within the lower lungs greatest in the left mid lung zone, probably chronic fibrotic and bronchitic changes. Underlying pneumonitis is possible. Electronically Signed   By: Kristine Garbe M.D.   On: 11/30/2018 17:57   Dg Foot Complete Right  Result Date: 11/28/2018 CLINICAL DATA:  Diabetic patient with a black great toe. EXAM: RIGHT FOOT COMPLETE - 3+ VIEW COMPARISON:  Plain films of the feet 06/14/2013. FINDINGS: Extensive bony destructive change is seen centered about the first MTP joint consistent with septic joint and osteomyelitis. No other plain film evidence of osteomyelitis is seen. Soft tissues about the first MTP joint and great toe are markedly swollen. Extensive atherosclerosis is identified. 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 Severe soft tissue swelling about the toe. Atherosclerosis. Electronically Signed   By: Inge Rise M.D.   On: 11/28/2018 15:53   [place] Ekg Site Rite  Result Date: 11/30/2018 If Site Rite image not attached, placement could not be confirmed due to current cardiac rhythm.    CBC Recent Labs  Lab 11/28/18 1502 11/29/18 0354 12/01/18 0421  WBC 12.9* 10.8* 9.5  HGB 13.6 11.3* 10.6*  HCT 42.4 35.1* 33.3*  PLT 302 262 237  MCV 95.3 94.1 96.5  MCH 30.6 30.3 30.7  MCHC 32.1 32.2 31.8  RDW 12.7 13.0 13.0  LYMPHSABS 2.1  --   --   MONOABS 1.2*  --   --   EOSABS 0.4  --   --   BASOSABS 0.0  --   --     Chemistries  Recent Labs  Lab 11/28/18 1502 11/29/18 0354 11/30/18 1014 12/01/18 0421  NA 136 136  --  134*  K 4.8 4.2  --  4.1  CL 106 107  --  107  CO2 22 20*  --  21*  GLUCOSE 200* 187*  --  192*  BUN 39* 38*  --  37*   CREATININE 1.98* 1.80* 1.83* 1.78*  CALCIUM 8.6* 8.0*  --  7.6*   ------------------------------------------------------------------------------------------------------------------ estimated creatinine clearance is 36.3 mL/min (A) (by C-G formula based on SCr of 1.78 mg/dL (H)). ------------------------------------------------------------------------------------------------------------------ No results for input(s): HGBA1C in the last 72 hours. ------------------------------------------------------------------------------------------------------------------ No results for input(s): CHOL, HDL, LDLCALC, TRIG, CHOLHDL, LDLDIRECT in the last 72 hours. ------------------------------------------------------------------------------------------------------------------ No results for input(s): TSH, T4TOTAL, T3FREE, THYROIDAB in the last 72 hours.  Invalid input(s): FREET3 ------------------------------------------------------------------------------------------------------------------ No results for input(s): VITAMINB12, FOLATE, FERRITIN, TIBC, IRON, RETICCTPCT in the last 72 hours.  Coagulation profile Recent Labs  Lab 11/28/18 1502 11/29/18 0634 11/30/18 0350 12/01/18 0421  INR 3.95 4.40* 3.87 3.15    No results for input(s): DDIMER in the last 72 hours.  Cardiac  Enzymes No results for input(s): CKMB, TROPONINI, MYOGLOBIN in the last 168 hours.  Invalid input(s): CK ------------------------------------------------------------------------------------------------------------------ Invalid input(s): POCBNP    Assessment & Plan   84 year old male with past medical history of diabetes, hypertension, chronic kidney disease, prostate cancer who presents to the hospital due to right foot pain and swelling and also a necrotic appearing right great toe.  1.  Right foot/great toe osteomyelitis/necrosis-  Appreciate podiatry input status post amputation of the gangrene right toe Continue  IV antibiotics with zosyn, vanco d/c'd Continue current wound care Status post PICC line placement, need IV antibiotics Wound cultures growing coag negative staph, enterococcus and a unidentified organism Plan for arteriogram on Wednesday to evaluate for peripheral vascular disease   2.  Diabetes type 2 with neuropathy- Rcontinue sliding scale Continue Amaryl   3.  Essential hypertension- blood pressure elevated will increase lisinopril dose .  4.  History of gout-no acute attack.  Continue Uloric.  5.  Hyperlipidemia-continue atorvastatin.  6.  Anxiety-continue Xanax.  7.  History of recurrent pulmonary embolism due to factor V Leiden mutation- INR continues to be elevated once INR below 2.5 heparin can be initiated      Code Status Orders  (From admission, onward)         Start     Ordered   11/29/18 1202  Do not attempt resuscitation (DNR)  Continuous    Question Answer Comment  In the event of cardiac or respiratory ARREST Do not call a "code blue"   In the event of cardiac or respiratory ARREST Do not perform Intubation, CPR, defibrillation or ACLS   In the event of cardiac or respiratory ARREST Use medication by any route, position, wound care, and other measures to relive pain and suffering. May use oxygen, suction and manual treatment of airway obstruction as needed for comfort.      11/29/18 1201        Code Status History    Date Active Date Inactive Code Status Order ID Comments User Context   11/28/2018 1800 11/29/2018 1201 Full Code [id number]  Henreitta Leber, MD Inpatient           Consults podiatry DVT Prophylaxis Coumadin  Lab Results  Component Value Date   PLT 237 12/01/2018     Time Spent in minutes   35 minutes  Greater than 50% of time spent in care coordination and counseling patient regarding the condition and plan of care.   Dustin Flock M.D on 12/01/2018 at 11:49 AM  Between 7am to 6pm - Pager - [phone]  After  6pm go to [email or web address] - Proofreader  [employer]   Office  [phone]

## 2018-12-01 NOTE — Progress Notes (Signed)
[facility] Daily Progress Note   Vascular Communication Note Will plan on a right lower extremity angiogram with Dr. Lucky Cowboy on Wed (12/02/18).  Will pre-op.  Marcelle Overlie PA-C 12/01/2018 1:48 PM

## 2018-12-01 NOTE — Progress Notes (Signed)
[facility] Podiatry                                                      Patient Demographics  Richard [name], is a 84 y.o. male   MRN: [id number]   DOB - September 08, 1931  Admit Date - 11/28/2018    Outpatient Primary MD for the patient is [name], MD  Consult requested in the Hospital by Dustin Flock, MD, On 12/01/2018   With History of -  Past Medical History:  Diagnosis Date  . Cancer ([place])   . Chronic kidney disease   . Colon polyp   . Diabetes mellitus   . History of blood clotting disorder       Past Surgical History:  Procedure Laterality Date  . AMPUTATION TOE Right 11/29/2018   Procedure: AMPUTATION TOE;  Surgeon: Albertine Patricia, DPM;  Location: [facility] ORS;  Service: Podiatry;  Laterality: Right;  . EYE SURGERY     CATARACTS  . HERNIA REPAIR    . SKIN CANCER EXCISION      in for   Chief Complaint  Patient presents with  . Blood Sugar Problem  . Foot Pain     HPI  Richard [name]  is a 84 y.o. male, 2 days status post first ray amputation right foot.  States is doing well has minimal pain.  Prescribed amputated due to gangrene to the toe and osteomyelitis    Review of Systems    In addition to the HPI above,  No Fever-chills, No Headache, No changes with Vision or hearing, No problems swallowing food or Liquids, No Chest pain, Cough or Shortness of Breath, No Abdominal pain, No Nausea or Vommitting, Bowel movements are regular, No Blood in stool or Urine, No dysuria, No new skin rashes or bruises, No new joints pains-aches,  No new weakness, tingling, numbness in any extremity, No recent weight gain or loss, No polyuria, polydypsia or polyphagia, No significant Mental Stressors.  A full 10 point Review of Systems was done, except as stated above, all other Review of Systems were negative.   Social History Social  History   Tobacco Use  . Smoking status: Unknown If Ever Smoked  . Smokeless tobacco: Never Used  Substance Use Topics  . Alcohol use: No    Family History History reviewed. No pertinent family history.  Prior to Admission medications   Medication Sig Start Date End Date Taking? Authorizing Provider  Febuxostat (ULORIC) 80 MG TABS Take 1 tablet by mouth daily.    Yes [provider]  glimepiride (AMARYL) 4 MG tablet Take 4 mg by mouth daily with breakfast.  06/15/17  Yes [provider]  lisinopril (PRINIVIL,ZESTRIL) 5 MG tablet Take 5 mg by mouth daily.     Yes [provider]  Red Yeast Rice 600 MG TABS Take 1 tablet by mouth daily.    Yes [provider]  tadalafil (CIALIS) 5 MG tablet Take 5 mg by mouth every other day.  05/21/17  Yes [provider]  warfarin (COUMADIN) 5 MG tablet Take 5 mg by mouth daily. Except MONDAYS and FRIDAYS.   Yes [provider]  warfarin (COUMADIN) 7.5 MG tablet Take 7.5 mg by mouth. On MONDAYS and FRIDAYS   Yes [provider]  Alpha-Lipoic Acid 200 MG CAPS Take by mouth.  [provider]  ALPRAZolam Duanne Moron) 0.25 MG tablet Take 0.25 mg by mouth at bedtime as needed.      [provider]  atorvastatin (LIPITOR) 10 MG tablet Take 10 mg by mouth daily.      [provider]  Calcium Carbonate (CALCIUM 600 PO) Take by mouth.      [provider]  Cholecalciferol (VITAMIN D) 2000 UNITS tablet Take 2,000 Units by mouth daily.      [provider]  CIALIS 5 MG tablet TK 1/2 T PO QD 06/16/17   [provider]  Coenzyme Q10 10 MG capsule Take 10 mg by mouth.    [provider]  colchicine 0.6 MG tablet Take 0.6 mg by mouth.    [provider]  gentamicin cream (GARAMYCIN) 0.1 % Apply 1 application topically 3 (three) times daily. Patient not taking: Reported on 11/28/2018 06/16/18   Edrick Kins, DPM  glimepiride (AMARYL) 2 MG  tablet Take 2 mg by mouth daily before breakfast.      [provider]  ibandronate (BONIVA) 150 MG tablet Take 150 mg by mouth every 30 (thirty) days. Take in the morning with a full glass of water, on an empty stomach, and do not take anything else by mouth or lie down for the next 30 min.     [provider]  Ibuprofen (ADVIL PO) Take by mouth.      [provider]  Multiple Vitamins-Minerals (MULTIVITAMIN ADULT EXTRA C PO) Take by mouth.    [provider]  Multiple Vitamins-Minerals (MULTIVITAMIN ADULT PO) Take by mouth.    [provider]  nystatin-triamcinolone (MYCOLOG II) cream Apply to affected areas bid 05/08/16   [provider]  tadalafil (CIALIS) 5 MG tablet TK 1/2 T PO QD 09/16/17   [provider]  Vitamins A & D (VITAMIN A & D) [address] UNITS TABS Take by mouth.      [provider]    Anti-infectives (From admission, onward)   Start     Dose/Rate Route Frequency Ordered Stop   11/30/18 1600  vancomycin (VANCOCIN) 1,250 mg in sodium chloride 0.9 % 250 mL IVPB     1,250 mg 166.7 mL/hr over 90 Minutes Intravenous Every 24 hours 11/30/18 1218     11/29/18 2200  piperacillin-tazobactam (ZOSYN) IVPB 3.375 g     3.375 g 12.5 mL/hr over 240 Minutes Intravenous Every 8 hours 11/28/18 1727     11/28/18 2300  vancomycin (VANCOCIN) IVPB 750 mg/150 ml premix  Status:  Discontinued     750 mg 150 mL/hr over 60 Minutes Intravenous Every 12 hours 11/28/18 1727 11/30/18 1218   11/28/18 1530  ceFEPIme (MAXIPIME) 2 g in sodium chloride 0.9 % 100 mL IVPB     2 g 200 mL/hr over 30 Minutes Intravenous  Once 11/28/18 1519 11/28/18 1658   11/28/18 1530  metroNIDAZOLE (FLAGYL) IVPB 500 mg  Status:  Discontinued     500 mg 100 mL/hr over 60 Minutes Intravenous Every 8 hours 11/28/18 1519 11/28/18 1727   11/28/18 1530  vancomycin (VANCOCIN) IVPB 1000 mg/200 mL premix     1,000 mg 200 mL/hr over 60 Minutes Intravenous  Once  11/28/18 1519 11/28/18 1801      Scheduled Meds: . atorvastatin  10 mg Oral QPM  . cholecalciferol  2,000 Units Oral Daily  . colchicine  0.6 mg Oral Daily  . febuxostat  80 mg Oral Daily  . insulin aspart  0-9  Units Subcutaneous TID WC  . lisinopril  5 mg Oral Daily  . multivitamin with minerals   Oral Daily  . sodium chloride flush  10-40 mL Intracatheter Q12H   Continuous Infusions: . sodium chloride 75 mL/hr at 11/30/18 2104  . piperacillin-tazobactam (ZOSYN)  IV 3.375 g (12/01/18 0527)  . vancomycin 1,250 mg (11/30/18 1801)   PRN Meds:.acetaminophen **OR** acetaminophen, ALPRAZolam, hydrALAZINE, ondansetron **OR** ondansetron (ZOFRAN) IV, sodium chloride flush  No Known Allergies  Physical Exam  Vitals  Blood pressure (!) 156/83, pulse 66, temperature 98.4 F (36.9 C), temperature source Oral, resp. rate 18, height 6' (1.829 m), weight 95.3 kg, SpO2 94 %.  Lower Extremity exam: Dressing changed today.  Overall the wound looks pretty stable he is got a little bit of maceration at the distal end of the incision area.  Does not appear to be significantly infected no heavy drainage on the packing.  White count back down to normal. Data Review  CBC Recent Labs  Lab 11/28/18 1502 11/29/18 0354 12/01/18 0421  WBC 12.9* 10.8* 9.5  HGB 13.6 11.3* 10.6*  HCT 42.4 35.1* 33.3*  PLT 302 262 237  MCV 95.3 94.1 96.5  MCH 30.6 30.3 30.7  MCHC 32.1 32.2 31.8  RDW 12.7 13.0 13.0  LYMPHSABS 2.1  --   --   MONOABS 1.2*  --   --   EOSABS 0.4  --   --   BASOSABS 0.0  --   --    ------------------------------------------------------------------------------------------------------------------  Chemistries  Recent Labs  Lab 11/28/18 1502 11/29/18 0354 11/30/18 1014 12/01/18 0421  NA 136 136  --  134*  K 4.8 4.2  --  4.1  CL 106 107  --  107  CO2 22 20*  --  21*  GLUCOSE 200* 187*  --  192*  BUN 39* 38*  --  37*  CREATININE 1.98* 1.80* 1.83* 1.78*  CALCIUM 8.6* 8.0*  --   7.6*   ----------------------------------------------------------------------------------------- Assessment & Plan: Overall think patient is pretty stable but I am still concerned about his peripheral vascular arterial flow to the right limb.  Acute scheduled to have a catheterization on the lower extremity tomorrow and angioplasty as necessary.  I redressed the foot today keep the antibiotics gone.  Awaiting bone cultures and pathology.  Family Communication: Plan discussed with patient   Albertine Patricia M.D on 12/01/2018 at 7:51 AM

## 2018-12-01 NOTE — Care Management Note (Signed)
Case Management Note  Patient Details  Name: Richard [name] MRN: [id number] Date of Birth: 1931/05/29  Subjective/Objective:                  RNCM met with patient briefly as he needed to use the bathroom and his assistant was in the room.  He states he lives alone. He has hired 40 hour week caregiver. His son lives behind him. He states that he would like to use [facility] for [facility] health.  He may need ambulatory equipment as I was not [name] to assess for that.  RNCM provided list of home health agencies via CMS.gov with 3-5 star ratings and that [facility] was part of [facility]. PCP DR. [name].   Action/Plan: Referral to [facility] with [facility] for pharmacy and home health needs per patient request.  RNCM to follow DME recommendations.   Expected Discharge Date:  12/04/18               Expected Discharge Plan:     In-House Referral:     Discharge planning Services  CM Consult  Post Acute Care Choice:  Durable Medical Equipment, Home Health Choice offered to:  Patient  DME Arranged:  IV pump/equipment DME Agency:  [place]:  PT, RN, Nurse's Aide [facility] Agency:  [place]  Status of Service:  In process, will continue to follow  If discussed at Long Length of Stay Meetings, dates discussed:    Additional Comments:  Marshell Garfinkel, RN 12/01/2018, 11:00 AM

## 2018-12-01 NOTE — Progress Notes (Signed)
Reviewed notes  He is for Angiogram today. Cx with enterococcus and MS coag neg staph.  Also one culture with yet unidentified organism  Rec Cont zosyn Will dc vanco

## 2018-12-01 NOTE — Care Management Important Message (Signed)
Important Message  Patient Details  Name: Richard [name] MRN: [id number] Date of Birth: March 25, 1931   Medicare Important Message Given:  Yes    Juliann Pulse A Cyrena Kuchenbecker 12/01/2018, 9:55 AM

## 2018-12-02 ENCOUNTER — Encounter
Admission: EM | Disposition: A | Discharge status: Home Health | Payer: Self-pay | Source: Home / Self Care | Attending: Specialist

## 2018-12-02 DIAGNOSIS — L97518 Non-pressure chronic ulcer of other part of right foot with other specified severity: Secondary | ICD-10-CM

## 2018-12-02 DIAGNOSIS — I70235 Atherosclerosis of native arteries of right leg with ulceration of other part of foot: Secondary | ICD-10-CM

## 2018-12-02 HISTORY — PX: LOWER EXTREMITY ANGIOGRAPHY: CATH118251

## 2018-12-02 LAB — GLUCOSE, CAPILLARY
Glucose-Capillary: 171 mg/dL — ABNORMAL HIGH (ref 70–99)
Glucose-Capillary: 161 mg/dL — ABNORMAL HIGH (ref 70–99)
Glucose-Capillary: 129 mg/dL — ABNORMAL HIGH (ref 70–99)
Glucose-Capillary: 101 mg/dL — ABNORMAL HIGH (ref 70–99)
Glucose-Capillary: 193 mg/dL — ABNORMAL HIGH (ref 70–99)

## 2018-12-02 LAB — BASIC METABOLIC PANEL
Anion gap: 7 (ref 5–15)
BUN: 40 mg/dL — ABNORMAL HIGH (ref 8–23)
CO2: 19 mmol/L — ABNORMAL LOW (ref 22–32)
Calcium: 8 mg/dL — ABNORMAL LOW (ref 8.9–10.3)
Chloride: 108 mmol/L (ref 98–111)
Creatinine, Ser: 2.08 mg/dL — ABNORMAL HIGH (ref 0.61–1.24)
GFR calc Af Amer: 33 mL/min — ABNORMAL LOW (ref >60)
GFR calc non Af Amer: 28 mL/min — ABNORMAL LOW (ref >60)
Glucose, Bld: 196 mg/dL — ABNORMAL HIGH (ref 70–99)
Potassium: 4.4 mmol/L (ref 3.5–5.1)
Sodium: 134 mmol/L — ABNORMAL LOW (ref 135–145)

## 2018-12-02 LAB — MAGNESIUM: Magnesium: 1.7 mg/dL (ref 1.7–2.4)

## 2018-12-02 LAB — PROTIME-INR
INR: 2.33
Prothrombin Time: 25.2 seconds — ABNORMAL HIGH (ref 11.4–15.2)

## 2018-12-02 SURGERY — LOWER EXTREMITY ANGIOGRAPHY
Anesthesia: Moderate Sedation | Laterality: Right

## 2018-12-02 MED ORDER — HEPARIN SODIUM (PORCINE) 1000 UNIT/ML IJ SOLN
INTRAMUSCULAR | Status: DC | PRN
  Administered 2018-12-02: 5000 [IU] via INTRAVENOUS

## 2018-12-02 MED ORDER — MIDAZOLAM HCL 5 MG/5ML IJ SOLN
INTRAMUSCULAR | Status: AC
  Filled 2018-12-02: qty 5

## 2018-12-02 MED ORDER — WARFARIN - PHARMACIST DOSING INPATIENT: Freq: Every day | Status: DC

## 2018-12-02 MED ORDER — SODIUM CHLORIDE 0.9 % IV SOLN
3.0000 g | Freq: Four times a day (QID) | INTRAVENOUS | Status: DC
  Administered 2018-12-02 – 2018-12-04 (×9): 3 g via INTRAVENOUS
  Filled 2018-12-02 (×14): qty 3

## 2018-12-02 MED ORDER — HEPARIN (PORCINE) IN NACL 1000-0.9 UT/500ML-% IV SOLN
INTRAVENOUS | Status: AC
  Filled 2018-12-02: qty 1000

## 2018-12-02 MED ORDER — GLIMEPIRIDE 2 MG PO TABS
2.0000 mg | ORAL_TABLET | Freq: Every day | ORAL | Status: DC
  Administered 2018-12-03 – 2018-12-04 (×2): 2 mg via ORAL
  Filled 2018-12-02 (×2): qty 1

## 2018-12-02 MED ORDER — METHYLPREDNISOLONE SODIUM SUCC 125 MG IJ SOLR: 125.0000 mg | INTRAMUSCULAR | Status: DC | PRN

## 2018-12-02 MED ORDER — HEPARIN SODIUM (PORCINE) 1000 UNIT/ML IJ SOLN
INTRAMUSCULAR | Status: AC
  Filled 2018-12-02: qty 1

## 2018-12-02 MED ORDER — FAMOTIDINE 20 MG PO TABS: 40.0000 mg | ORAL_TABLET | ORAL | Status: DC | PRN

## 2018-12-02 MED ORDER — MIDAZOLAM HCL 2 MG/2ML IJ SOLN
INTRAMUSCULAR | Status: DC | PRN
  Administered 2018-12-02: 2 mg via INTRAVENOUS

## 2018-12-02 MED ORDER — WARFARIN SODIUM 5 MG PO TABS
5.0000 mg | ORAL_TABLET | ORAL | Status: DC
  Administered 2018-12-02 – 2018-12-03 (×2): 5 mg via ORAL
  Filled 2018-12-02 (×2): qty 1

## 2018-12-02 MED ORDER — SODIUM CHLORIDE 0.9 % IV SOLN: INTRAVENOUS | Status: DC

## 2018-12-02 MED ORDER — FENTANYL CITRATE (PF) 100 MCG/2ML IJ SOLN
INTRAMUSCULAR | Status: DC | PRN
  Administered 2018-12-02: 50 ug via INTRAVENOUS

## 2018-12-02 MED ORDER — WARFARIN SODIUM 7.5 MG PO TABS
7.5000 mg | ORAL_TABLET | ORAL | Status: DC
  Filled 2018-12-02: qty 1

## 2018-12-02 MED ORDER — ONDANSETRON HCL 4 MG/2ML IJ SOLN: 4.0000 mg | Freq: Four times a day (QID) | INTRAMUSCULAR | Status: DC | PRN

## 2018-12-02 MED ORDER — LIDOCAINE-EPINEPHRINE (PF) 1 %-1:200000 IJ SOLN
INTRAMUSCULAR | Status: AC
  Filled 2018-12-02: qty 10

## 2018-12-02 MED ORDER — FENTANYL CITRATE (PF) 100 MCG/2ML IJ SOLN
INTRAMUSCULAR | Status: AC
  Filled 2018-12-02: qty 2

## 2018-12-02 MED ORDER — IOPAMIDOL (ISOVUE-300) INJECTION 61%
INTRAVENOUS | Status: DC | PRN
  Administered 2018-12-02: 80 mL via INTRA_ARTERIAL

## 2018-12-02 MED ORDER — HYDROMORPHONE HCL 1 MG/ML IJ SOLN: 1.0000 mg | Freq: Once | INTRAMUSCULAR | Status: DC | PRN

## 2018-12-02 SURGICAL SUPPLY — 26 items
BALLN LUTONIX 018 4X80X130 (BALLOONS) ×2
BALLN LUTONIX 018 6X80X130 (BALLOONS) ×2
BALLN ULTRVRSE 2.5X300X150 (BALLOONS) ×2
BALLN ULTRVRSE 3X300X150 (BALLOONS) ×1
BALLN ULTRVRSE 3X300X150 OTW (BALLOONS) ×1
BALLOON LUTONIX 018 4X80X130 (BALLOONS) ×1 IMPLANT
BALLOON LUTONIX 018 6X80X130 (BALLOONS) ×1 IMPLANT
BALLOON ULTRVRSE 2.5X300X150 (BALLOONS) ×1 IMPLANT
BALLOON ULTRVRSE 3X300X150 OTW (BALLOONS) ×1 IMPLANT
CATH BEACON 5 .035 65 RIM TIP (CATHETERS) ×2 IMPLANT
CATH BEACON 5 .038 100 VERT TP (CATHETERS) ×2 IMPLANT
CATH CXI SUPP ANG 4FR 135 (CATHETERS) ×1 IMPLANT
CATH CXI SUPP ANG 4FR 135CM (CATHETERS) ×2
CATH PIG 70CM (CATHETERS) ×2 IMPLANT
CATH SEEKER .018X150 (CATHETERS) ×2 IMPLANT
DEVICE PRESTO INFLATION (MISCELLANEOUS) ×2 IMPLANT
DEVICE STARCLOSE SE CLOSURE (Vascular Products) ×2 IMPLANT
GLIDEWIRE ADV .035X260CM (WIRE) ×2 IMPLANT
GUIDEWIRE PFTE-COATED .018X300 (WIRE) ×2 IMPLANT
PACK ANGIOGRAPHY (CUSTOM PROCEDURE TRAY) ×2 IMPLANT
SHEATH BRITE TIP 5FRX11 (SHEATH) ×2 IMPLANT
SHEATH RAABE 6FRX70 (SHEATH) ×2 IMPLANT
SYR MEDRAD MARK V 150ML (SYRINGE) ×2 IMPLANT
TUBING CONTRAST HIGH PRESS 72 (TUBING) ×2 IMPLANT
WIRE G V18X300CM (WIRE) ×2 IMPLANT
WIRE J 3MM .035X145CM (WIRE) ×2 IMPLANT

## 2018-12-02 NOTE — Progress Notes (Signed)
PHARMACY CONSULT NOTE FOR:  OUTPATIENT  PARENTERAL ANTIBIOTIC THERAPY (OPAT)  Indication: Diabetic foot ulcer and osteomyelitis Regimen: ampicillin/sulbactam 12gm/24h continuous infusion - Dose will need adjusted if SCr worsens End date:  12/14/2018  IV antibiotic discharge orders are pended. To discharging provider:  please sign these orders via discharge navigator,  Select New Orders & click on the button choice - Manage This Unsigned Work.     Thank you for allowing pharmacy to be a part of this patient's care.  Doreene Eland, PharmD, BCPS.   Work Cell: [phone] 12/02/2018 10:26 AM

## 2018-12-02 NOTE — Consult Note (Signed)
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   Medical History: Past Medical History:  Diagnosis Date  . Cancer ([place])   . Chronic kidney disease   . Colon polyp   . Diabetes mellitus   . History of blood clotting disorder     Medications:  Warfarin   Assessment: Pharmacy consulted for heparin drip dosing and monitoring for 84 yo male with PMH of PE and factor V Leiden mutation. Patient is admitted with gangrene of right great toe. Planned first ray amputation surgery 12/14.  INR supratherapeutic on admission @ 3.95.  Goal of Therapy:  INR 2-3 Heparin level 0.3-0.7 units/ml Monitor platelets by anticoagulation protocol: Yes   Plan:  INR today 2.33. Post angiogram. Will resume VKA this evening and discontinue heparin consults since INR still therapeutic. Pharmacy will continue to follow and adjust as needed to maintain INR 2 to 3.  Laural Benes, PharmD, BCPS Clinical Pharmacist 12/02/2018 3:30 PM

## 2018-12-02 NOTE — Progress Notes (Signed)
Dr. Lucky Cowboy at bedside with pt. To discuss procedural results with pt. Pt. Verbalized understanding . No family available at present.

## 2018-12-02 NOTE — Progress Notes (Addendum)
[facility] INFECTIOUS DISEASE PROGRESS NOTE Date of Admission:  11/28/2018     ID: Richard [name] is a 84 y.o. male with  Diabetic foot infection, gangrene Active Problems:   Acute osteomyelitis of toe of right foot (HCC)   Pressure injury of skin   Subjective: No fevers, for angiogram today  ROS  Eleven systems are reviewed and negative except per hpi  Medications:  Antibiotics Given (last 72 hours)    Date/Time Action Medication Dose Rate   11/29/18 1216 New Bag/Given   vancomycin (VANCOCIN) IVPB 750 mg/150 ml premix 750 mg 150 mL/hr   11/29/18 2140 New Bag/Given   piperacillin-tazobactam (ZOSYN) IVPB 3.375 g 3.375 g 12.5 mL/hr   11/30/18 0045 New Bag/Given   vancomycin (VANCOCIN) IVPB 750 mg/150 ml premix 750 mg 150 mL/hr   11/30/18 0610 New Bag/Given   piperacillin-tazobactam (ZOSYN) IVPB 3.375 g 3.375 g 12.5 mL/hr   11/30/18 1548 New Bag/Given   piperacillin-tazobactam (ZOSYN) IVPB 3.375 g 3.375 g 12.5 mL/hr   11/30/18 1801 New Bag/Given   vancomycin (VANCOCIN) 1,250 mg in sodium chloride 0.9 % 250 mL IVPB 1,250 mg 166.7 mL/hr   11/30/18 2202 New Bag/Given   piperacillin-tazobactam (ZOSYN) IVPB 3.375 g 3.375 g 12.5 mL/hr   12/01/18 0527 New Bag/Given   piperacillin-tazobactam (ZOSYN) IVPB 3.375 g 3.375 g 12.5 mL/hr   12/01/18 1342 New Bag/Given   piperacillin-tazobactam (ZOSYN) IVPB 3.375 g 3.375 g 12.5 mL/hr   12/01/18 2138 New Bag/Given   piperacillin-tazobactam (ZOSYN) IVPB 3.375 g 3.375 g 12.5 mL/hr   12/02/18 0606 New Bag/Given   piperacillin-tazobactam (ZOSYN) IVPB 3.375 g 3.375 g 12.5 mL/hr     . atorvastatin  10 mg Oral QPM  . cholecalciferol  2,000 Units Oral Daily  . colchicine  0.6 mg Oral Daily  . febuxostat  80 mg Oral Daily  . insulin aspart  0-9 Units Subcutaneous TID WC  . lisinopril  10 mg Oral Daily  . multivitamin with minerals   Oral Daily  . sodium chloride flush  10-40 mL Intracatheter Q12H    Objective: Vital signs in last 24  hours: Temp:  [97.7 F (36.5 C)-98.5 F (36.9 C)] 97.7 F (36.5 C) (12/18 0751) Pulse Rate:  [61-70] 70 (12/18 0751) Resp:  [18] 18 (12/17 2327) BP: (150-165)/(84-93) 157/84 (12/18 0751) SpO2:  [95 %-97 %] 95 % (12/18 0751) Constitutional: He is oriented to person, place, and time. He appears well-developed and well-nourished. No distress.  HENT:  Mouth/Throat: Oropharynx is clear and moist. No oropharyngeal exudate.  Cardiovascular: Normal rate, regular rhythm and normal heart sounds. Exam reveals no gallop and no friction rub.  No murmur heard.  Pulmonary/Chest: Effort normal and breath sounds normal. No respiratory distress. He has no wheezes.  Abdominal: Soft. Bowel sounds are normal. He exhibits no distension. There is no tenderness.  Lymphadenopathy:  He has no cervical adenopathy.  Neurological: He is alert and oriented to person, place, and time.  Skin:  R foot wrapped post op Psychiatric: He has a normal mood and affect. His behavior is normal.   Lab Results Recent Labs    12/01/18 0421 12/02/18 0406  WBC 9.5  --   HGB 10.6*  --   HCT 33.3*  --   NA 134* 134*  K 4.1 4.4  CL 107 108  CO2 21* 19*  BUN 37* 40*  CREATININE 1.78* 2.08*    Microbiology: Results for orders placed or performed during the hospital encounter of 11/28/18  Blood  Culture (routine x 2)     Status: None (Preliminary result)   Collection Time: 11/28/18  4:15 PM  Result Value Ref Range Status   Specimen Description BLOOD R HAND  Final   Special Requests   Final    BOTTLES DRAWN AEROBIC AND ANAEROBIC Blood Culture results may not be optimal due to an excessive volume of blood received in culture bottles   Culture   Final    NO GROWTH 4 DAYS Performed at [facility], [address] [place], [place] [address]    Report Status PENDING  Incomplete  Blood Culture (routine x 2)     Status: None (Preliminary result)   Collection Time: 11/28/18  4:15 PM  Result Value Ref Range Status    Specimen Description BLOOD R WRIST  Final   Special Requests   Final    BOTTLES DRAWN AEROBIC AND ANAEROBIC Blood Culture results may not be optimal due to an excessive volume of blood received in culture bottles   Culture   Final    NO GROWTH 4 DAYS Performed at [facility], [address], [place], [place] [address]    Report Status PENDING  Incomplete  Aerobic/Anaerobic Culture (surgical/deep wound)     Status: None (Preliminary result)   Collection Time: 11/28/18  6:14 PM  Result Value Ref Range Status   Specimen Description   Final    TOE Performed at [facility], [address] [place], [place] [address]    Special Requests   Final    RIGHT Performed at [facility], [place], [place] [address]    Gram Stain NO WBC SEEN RARE GRAM POSITIVE COCCI IN PAIRS   Final   Culture   Final    MODERATE STAPHYLOCOCCUS SPECIES (COAGULASE NEGATIVE) MODERATE ENTEROCOCCUS FAECALIS NO ANAEROBES ISOLATED; CULTURE IN PROGRESS FOR 5 DAYS    Report Status PENDING  Incomplete   Organism ID, Bacteria STAPHYLOCOCCUS SPECIES (COAGULASE NEGATIVE)  Final   Organism ID, Bacteria ENTEROCOCCUS FAECALIS  Final      Susceptibility   Enterococcus faecalis - MIC*    AMPICILLIN <=2 SENSITIVE Sensitive     VANCOMYCIN <=0.5 SENSITIVE Sensitive     GENTAMICIN SYNERGY RESISTANT Resistant     * MODERATE ENTEROCOCCUS FAECALIS   Staphylococcus species (coagulase negative) - MIC*    CIPROFLOXACIN <=0.5 SENSITIVE Sensitive     ERYTHROMYCIN 4 INTERMEDIATE Intermediate     GENTAMICIN >=16 RESISTANT Resistant     OXACILLIN <=0.25 SENSITIVE Sensitive     TETRACYCLINE <=1 SENSITIVE Sensitive     VANCOMYCIN <=0.5 SENSITIVE Sensitive     TRIMETH/SULFA <=10 SENSITIVE Sensitive     CLINDAMYCIN <=0.25 SENSITIVE Sensitive     RIFAMPIN <=0.5 SENSITIVE Sensitive     Inducible Clindamycin NEGATIVE Sensitive     * MODERATE STAPHYLOCOCCUS SPECIES (COAGULASE NEGATIVE)   Surgical pcr screen     Status: None   Collection Time: 11/29/18  6:30 AM  Result Value Ref Range Status   MRSA, PCR NEGATIVE NEGATIVE Final   Staphylococcus aureus NEGATIVE NEGATIVE Final    Comment: (NOTE) The Xpert SA Assay (FDA approved for NASAL specimens in patients 56 years of age and older), is one component of a comprehensive surveillance program. It is not intended to diagnose infection nor to guide or monitor treatment. Performed at [facility], [address] [place],  [address]   Aerobic/Anaerobic Culture (surgical/deep wound)     Status: None (Preliminary result)  Collection Time: 11/29/18  8:37 AM  Result Value Ref Range Status   Specimen Description BONE RIGHT TOE GREAT  Final   Special Requests PATIENT ON FOLLOWING VANC ZOSYN CEFEPIME  Final   Gram Stain   Final    ABUNDANT WBC PRESENT, PREDOMINANTLY PMN FEW GRAM POSITIVE COCCI    Culture   Final    MODERATE STAPHYLOCOCCUS SIMULANS MODERATE ENTEROCOCCUS FAECALIS NO ANAEROBES ISOLATED; CULTURE IN PROGRESS FOR 5 DAYS    Report Status PENDING  Incomplete   Organism ID, Bacteria STAPHYLOCOCCUS SIMULANS  Final   Organism ID, Bacteria ENTEROCOCCUS FAECALIS  Final      Susceptibility   Enterococcus faecalis - MIC*    AMPICILLIN <=2 SENSITIVE Sensitive     VANCOMYCIN 1 SENSITIVE Sensitive     GENTAMICIN SYNERGY RESISTANT Resistant     * MODERATE ENTEROCOCCUS FAECALIS   Staphylococcus simulans - MIC*    CIPROFLOXACIN <=0.5 SENSITIVE Sensitive     ERYTHROMYCIN 1 INTERMEDIATE Intermediate     GENTAMICIN >=16 RESISTANT Resistant     OXACILLIN <=0.25 SENSITIVE Sensitive     TETRACYCLINE <=1 SENSITIVE Sensitive     VANCOMYCIN <=0.5 SENSITIVE Sensitive     TRIMETH/SULFA <=10 SENSITIVE Sensitive     CLINDAMYCIN <=0.25 SENSITIVE Sensitive     RIFAMPIN <=0.5 SENSITIVE Sensitive     Inducible Clindamycin Value in next row Sensitive      NEGATIVEPerformed at [facility] [address],  [place], [place] [address]    * MODERATE STAPHYLOCOCCUS SIMULANS    Studies/Results: Dg Chest Port 1 View  Result Date: 11/30/2018 CLINICAL DATA:  84 y/o  M; PICC line placement. History of diabetes. EXAM: PORTABLE CHEST 1 VIEW COMPARISON:  None. FINDINGS: Normal cardiac silhouette given projection and technique. Right PICC line tip projects over the lower SVC. Coarse reticular opacities within the lower lungs greatest in the left mid lung zone, probably chronic fibrotic and bronchitic changes. Underlying pneumonitis is possible. No pleural effusion or pneumothorax. Bones are unremarkable. IMPRESSION: 1. Right PICC line tip projects over lower SVC. 2. Coarse reticular opacities within the lower lungs greatest in the left mid lung zone, probably chronic fibrotic and bronchitic changes. Underlying pneumonitis is possible. Electronically Signed   By: Kristine Garbe M.D.   On: 11/30/2018 17:57   [place] Ekg Site Rite  Result Date: 11/30/2018 If Site Rite image not attached, placement could not be confirmed due to current cardiac rhythm.   Assessment/Plan: Richard [name] is a 84 y.o. 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  He presented with acute changes over several days but tells me he had the wound for up to 6 months. He tells me he did not receive any antibiotics as an otpt. Now has had amputation of toe and part of 1st ray. [place] Kitchen MRSA PCR negative.  12/18 - doing well. Angiogram pending. Cx with Staph simulans and enterococcus.  ESR and CRP 95 and 9.1 on 12/15/  Recommendations Can change zosyn to unasyn and will plan on this as outpatient regimen for 2 weeks post op then can change to oral augmentin for another 2-4 weeks if all is healing well.   Will need close follow up of how healing is progressing and can extend IV abx if needed Thank you very much for the consult. Will follow with you.  Leonel Ramsay   12/02/2018, 9:53 AM

## 2018-12-02 NOTE — Op Note (Signed)
[facility]  Percutaneous Study/Intervention Procedural Note   Date of Surgery: 12/02/2018  Surgeon(s):Anastazia Creek    Assistants:none  Pre-operative Diagnosis: PAD with ulceration and infection right foot  Post-operative diagnosis:  Same  Procedure(s) Performed:             1.  Ultrasound guidance for vascular access left femoral artery             2.  Catheter placement into right SFA from left femoral approach             3.  Aortogram and selective right lower extremity angiogram including selective images of the right posterior tibial, peroneal, and anterior tibial arteries             4.  Percutaneous transluminal angioplasty of right peroneal artery with 3 mm diameter by 30 cm length angioplasty balloon and of the tibioperoneal trunk with 4 mm diameter by 8 cm length Lutonix drug-coated angioplasty balloon             5.   Percutaneous transluminal angioplasty of the right posterior tibial artery with 2.5 mm diameter by 30 cm length angioplasty balloon  6.  Percutaneous transluminal angioplasty of the right distal SFA and proximal popliteal artery with 6 mm diameter by 8 cm length Lutonix drug-coated angioplasty balloon             7.  StarClose closure device left femoral artery  EBL: 10 cc  Contrast: 80 cc  Fluoro Time: 15.4 minutes  Moderate Conscious Sedation Time: approximately 60 minutes using 2 mg of Versed and 50 Mcg of Fentanyl              Indications:  Patient is a 84 y.o.male with infected ulceration of the right foot.  He was been admitted to the hospital and had debridement of the foot by podiatry. The patient has not had any noninvasive studies. The patient is brought in for angiography for further evaluation and potential treatment.  Due to the limb threatening nature of the situation, angiogram was performed for attempted limb salvage. The patient is aware that if the procedure fails, amputation would be expected.  The patient also  understands that even with successful revascularization, amputation may still be required due to the severity of the situation.  Risks and benefits are discussed and informed consent is obtained.   Procedure:  The patient was identified and appropriate procedural time out was performed.  The patient was then placed supine on the table and prepped and draped in the usual sterile fashion. Moderate conscious sedation was administered during a face to face encounter with the patient throughout the procedure with my supervision of the RN administering medicines and monitoring the patient's vital signs, pulse oximetry, telemetry and mental status throughout from the start of the procedure until the patient was taken to the recovery room. Ultrasound was used to evaluate the left common femoral artery.  It was patent .  A digital ultrasound image was acquired.  A Seldinger needle was used to access the left common femoral artery under direct ultrasound guidance and a permanent image was performed.  A 0.035 J wire was advanced without resistance and a 5Fr sheath was placed.  Pigtail catheter was placed into the aorta and an AP aortogram was performed. This demonstrated mild to moderate left renal artery stenosis and no right renal artery stenosis.  The aorta and iliac arteries are large and patent without significant stenosis but there is marked  tortuosity. I then crossed the aortic bifurcation and advanced to the right femoral head.  This was done with a rim catheter and an advantage wire due to the tortuosity.  The rim catheter had to be advanced to the mid SFA to opacify distally due to the slow circulation time.  Selective right lower extremity angiogram was then performed. This demonstrated 60 to 70% stenosis at [place]'s canal in the distal SFA and proximal popliteal arteries.  The vessel then normalized and there was a reasonably normal tibial trifurcation.  The tibioperoneal trunk had high-grade nearly occlusive  stenosis.  The peroneal artery had multiple areas of moderate stenosis in the 70 to 75% range in the proximal and mid segments.  The posterior tibial artery had multiple areas of short segment occlusion but then was continuous into the foot distally.  The anterior tibial artery had a long segment occlusion but there was some reconstitution of the dorsalis pedis artery in the foot. It was felt that it was in the patient's best interest to proceed with intervention after these images to avoid a second procedure and a larger amount of contrast and fluoroscopy based off of the findings from the initial angiogram. The patient was systemically heparinized and a 6 French 70 cm sheath was then placed over the Terumo Advantage wire. I then used a Kumpe catheter and the advantage wire to navigate down into the below-knee popliteal artery through the distal SFA stenosis.  I then exchanged for a 0.018 advantage wire and a CXI catheter and cross the tibioperoneal trunk and peroneal stenoses without difficulty.  After selective imaging, angioplasty was performed from the mid to distal peroneal artery up to the tibioperoneal trunk.  We used a 3 mm diameter by 30 cm length angioplasty balloon inflated to 8 atm for 1 minute.  The tibioperoneal trunk was generous and I used a 4 mm diameter by 8 cm length Lutonix drug-coated angioplasty balloon to treat this separately with an inflation to 8 atm for 1 minute.  The peroneal artery and tibioperoneal trunk had less than 20% residual stenosis.  I then turned my attention to the posterior tibial artery.  Tediously advanced into the proximal posterior tibial artery with a CXI catheter and the 0.018 advantage wire.  Selective imaging was performed through the CXI catheter and I then crossed the multiple occlusions with the help of the CXI catheter and the 0.018 wire parking the wire in the foot.  Angioplasty was then performed from just above the ankle up to the proximal posterior tibial  artery using a 2.5 mm diameter by 30 cm length angioplasty balloon inflated to 6 atm for 1 minute.  Completion imaging showed some areas of 30 to 40% residual stenosis but there was markedly improved flow down to the foot where the vessel still somewhat pruned.  I then turned my attention to the anterior tibial artery.  The 0.018 advantage wire and the CXI catheter was used to selectively cannulate the anterior tibial artery.  Selective imaging was performed and I then tried to cross the long segment of lesion.  The CXI catheter would not cross through the occlusion so I exchanged for a seeker catheter and could get to the mid to distal anterior tibial artery and get the wire down to the ankle, but could never regain intraluminal flow in the dorsalis pedis artery in the foot.  Even the 0.018 seeker catheter would not cross the occlusion which was highly calcific.  We had restored two-vessel runoff so  I then turned my attention up to the distal SFA and proximal popliteal artery lesion.  A 6 mm diameter by 8 cm length Lutonix drug-coated angioplasty balloon was inflated to 14 atm for 1 minute.  Completion imaging showed about a 25 to 30% residual stenosis after angioplasty. I elected to terminate the procedure. The sheath was removed and StarClose closure device was deployed in the left femoral artery with excellent hemostatic result. The patient was taken to the recovery room in stable condition having tolerated the procedure well.  Findings:               Aortogram:  What appears to be mild to moderate left renal artery stenosis and no right renal artery stenosis.  The aorta and iliac arteries are large and patent without significant stenosis but there is marked tortuosity.             Right Lower Extremity:  This demonstrated 60 to 70% stenosis at [place]'s canal in the distal SFA and proximal popliteal arteries.  The vessel then normalized and there was a reasonably normal tibial trifurcation.  The  tibioperoneal trunk had high-grade nearly occlusive stenosis.  The peroneal artery had multiple areas of moderate stenosis in the 70 to 75% range in the proximal and mid segments.  The posterior tibial artery had multiple areas of short segment occlusion but then was continuous into the foot distally.  The anterior tibial artery had a long segment occlusion but there was some reconstitution of the dorsalis pedis artery in the foo   Disposition: Patient was taken to the recovery room in stable condition having tolerated the procedure well.  Complications: None  Leotis Pain 12/02/2018 1:50 PM   This note was created with Dragon Medical transcription system. Any errors in dictation are purely unintentional.

## 2018-12-02 NOTE — Progress Notes (Signed)
Pt off floor for procedure

## 2018-12-02 NOTE — H&P (Signed)
[facility] History & Physical Update  The patient was interviewed and re-examined.  The patient's previous History and Physical has been reviewed and is unchanged.  There is no change in the plan of care. We plan to proceed with the scheduled procedure.  Leotis Pain, MD  12/02/2018, 11:07 AM

## 2018-12-02 NOTE — Progress Notes (Addendum)
[facility] [place] NAME: Richard [name]    MR#:  [id number]  DATE OF BIRTH:  Apr 22, 1931  SUBJECTIVE:   No acute events overnight, plan is for right lower extremity angiogram today.  Patient is status post PICC line placement.  No fevers, nausea vomiting, chest pain or shortness of breath.  REVIEW OF SYSTEMS:    Review of Systems  Constitutional: Negative for chills and fever.  HENT: Negative for congestion and tinnitus.   Eyes: Negative for blurred vision and double vision.  Respiratory: Negative for cough, shortness of breath and wheezing.   Cardiovascular: Negative for chest pain, orthopnea and PND.  Gastrointestinal: Negative for abdominal pain, diarrhea, nausea and vomiting.  Genitourinary: Negative for dysuria and hematuria.  Neurological: Negative for dizziness, sensory change and focal weakness.  All other systems reviewed and are negative.   Nutrition: Heart Healthy Tolerating Diet: Yes Tolerating PT: Await Eval.   DRUG ALLERGIES:  No Known Allergies  VITALS:  Blood pressure (!) 160/79, pulse 63, temperature 98 F (36.7 C), temperature source Oral, resp. rate 18, height 6' (1.829 m), weight 95.3 kg, SpO2 99 %.  PHYSICAL EXAMINATION:   Physical Exam  GENERAL:  84 y.o.-year-old patient lying in bed in no acute distress.  EYES: Pupils equal, round, reactive to light and accommodation. No scleral icterus. Extraocular muscles intact.  HEENT: Head atraumatic, normocephalic. Oropharynx and nasopharynx clear.  NECK:  Supple, no jugular venous distention. No thyroid enlargement, no tenderness.  LUNGS: Normal breath sounds bilaterally, no wheezing, rales, rhonchi. No use of accessory muscles of respiration.  CARDIOVASCULAR: S1, S2 normal. No murmurs, rubs, or gallops.  ABDOMEN: Soft, nontender, nondistended. Bowel sounds present. No organomegaly or mass.  EXTREMITIES: No cyanosis, clubbing, edema b/l.  Right foot dressing  in place from recent amputation.   NEUROLOGIC: Cranial nerves II through XII are intact. No focal Motor or sensory deficits b/l.  Globally weak.  PSYCHIATRIC: The patient is alert and oriented x 3.  SKIN: No obvious rash, lesion, or ulcer.    LABORATORY PANEL:   CBC Recent Labs  Lab 12/01/18 0421  WBC 9.5  HGB 10.6*  HCT 33.3*  PLT 237   ------------------------------------------------------------------------------------------------------------------  Chemistries  Recent Labs  Lab 12/02/18 0406  NA 134*  K 4.4  CL 108  CO2 19*  GLUCOSE 196*  BUN 40*  CREATININE 2.08*  CALCIUM 8.0*  MG 1.7   ------------------------------------------------------------------------------------------------------------------  Cardiac Enzymes No results for input(s): TROPONINI in the last 168 hours. ------------------------------------------------------------------------------------------------------------------  RADIOLOGY:  Dg Chest Port 1 View  Result Date: 11/30/2018 CLINICAL DATA:  84 y/o  M; PICC line placement. History of diabetes. EXAM: PORTABLE CHEST 1 VIEW COMPARISON:  None. FINDINGS: Normal cardiac silhouette given projection and technique. Right PICC line tip projects over the lower SVC. Coarse reticular opacities within the lower lungs greatest in the left mid lung zone, probably chronic fibrotic and bronchitic changes. Underlying pneumonitis is possible. No pleural effusion or pneumothorax. Bones are unremarkable. IMPRESSION: 1. Right PICC line tip projects over lower SVC. 2. Coarse reticular opacities within the lower lungs greatest in the left mid lung zone, probably chronic fibrotic and bronchitic changes. Underlying pneumonitis is possible. Electronically Signed   By: Kristine Garbe M.D.   On: 11/30/2018 17:57     ASSESSMENT AND PLAN:   84 year old male with past medical history of diabetes, hypertension, chronic kidney disease, prostate cancer who presents to the  hospital due to right  foot pain and swelling and also a necrotic appearing right great toe.  1. Right foot/great toe osteomyelitis/necrosis-  Appreciate podiatry input status post amputation right Great Toe POD # 3 today.  - Now on IV Unasyn.  Wound cultures are growing enterococcus and staph simulans.  Appreciate infectious disease input and patient will need long-term IV antibiotics.  PICC line has been placed. - Patient will need home health with IV antibiotics until 12/14/18.  -Appreciate vascular surgery input and patient is status post right lower extremity angiogram with intervention done today.   2. Diabetes type 2 with neuropathy- continue sliding scale Continue Amaryl. BS stable.   3. Essential hypertension- cont. Lisinopril, PRN hydralazine.   4. History of gout-no acute attack. Continue Uloric.  5. Hyperlipidemia-continue atorvastatin.  6. Anxiety-continue Xanax.  7. History of recurrent pulmonary embolism due to factor V Leiden mutation-  currently on heparin drip as patient was getting angiogram today, but will resume Coumadin.  Plan For possible discharge home with home health services tomorrow.    All the records are reviewed and case discussed with Care Management/Social Worker. Management plans discussed with the patient, family and they are in agreement.  CODE STATUS: DNR  DVT Prophylaxis: Coumadin  TOTAL TIME TAKING CARE OF THIS PATIENT: 30 minutes.   POSSIBLE D/C IN 1-2 DAYS, DEPENDING ON CLINICAL CONDITION.   Henreitta Leber M.D on 12/02/2018 at 3:14 PM  Between 7am to 6pm - Pager - [phone]  After 6pm go to [email or web address] - Proofreader  [facility]  Office  [phone]  CC: Primary care physician; [name], MD

## 2018-12-02 NOTE — Progress Notes (Addendum)
Infectious Disease Long Term IV Antibiotic Orders JIHAN RUDY [birth year]  Diagnosis:  PAD with gangrene and DM foot infection and osteomyelitis, s/p great toe amputation and ray amputation on 12/15  Culture results Order Status: Completed Specimen: Wound Updated: 12/02/18 0936   Specimen Description BONE RIGHT TOE GREAT   Special Requests PATIENT ON FOLLOWING VANC ZOSYN CEFEPIME   Gram Stain --   ABUNDANT WBC PRESENT, PREDOMINANTLY PMN  FEW GRAM POSITIVE COCCI    Culture --   MODERATE STAPHYLOCOCCUS SIMULANS  MODERATE ENTEROCOCCUS FAECALIS  NO ANAEROBES ISOLATED; CULTURE IN PROGRESS FOR 5 DAYS    Report Status PENDING   Organism ID, Bacteria STAPHYLOCOCCUS SIMULANS   Organism ID, Bacteria ENTEROCOCCUS FAECALIS  Susceptibility   Staphylococcus simulans ([id number])   Antibiotic Interpretation Microscan Method Status   CIPROFLOXACIN Sensitive <=0.5 SENSITIVE MIC Final   ERYTHROMYCIN Intermediate 1 INTERMEDIATE MIC Final   GENTAMICIN Resistant >=16 RESISTANT MIC Final   OXACILLIN Sensitive <=0.25 SENSITIVE MIC Final   TETRACYCLINE Sensitive <=1 SENSITIVE MIC Final   VANCOMYCIN Sensitive <=0.5 SENSITIVE MIC Final   TRIMETH/SULFA Sensitive <=10 SENSITIVE MIC Final   CLINDAMYCIN Sensitive <=0.25 SENSITIVE MIC Final   RIFAMPIN Sensitive <=0.5 SENSITIVE MIC Final   Inducible Clindamycin Sensitive  MIC Final    NEGATIVE Performed at [facility], [address] [place], [place] [address]  Susceptibility Comments   MODERATE STAPHYLOCOCCUS SIMULANS  Enterococcus faecalis (ZZ01)   Antibiotic Interpretation Microscan Method Status   AMPICILLIN Sensitive <=2 SENSITIVE MIC Final   VANCOMYCIN Sensitive 1 SENSITIVE MIC Final   GENTAMICIN SYNERGY Resistant RESISTANT MIC Final          LABS Lab Results  Component Value Date   CREATININE 2.08 (H) 12/02/2018   Lab Results  Component Value Date   WBC 9.5 12/01/2018   HGB 10.6 (L) 12/01/2018   HCT 33.3 (L) 12/01/2018   MCV  96.5 12/01/2018   PLT 237 12/01/2018   Lab Results  Component Value Date   ESRSEDRATE 95 (H) 11/30/2018   Lab Results  Component Value Date   CRP 9.1 (H) 11/30/2018    Allergies: No Known Allergies  Discharge antibiotics Unasyn 3 gm q 6 hours or continuous infusion if feasible   PICC Care per protocol Labs weekly while on IV antibiotics -FAX weekly labs to [phone] CBC w diff   Comprehensive met panel  CRP   Planned duration of antibiotics 2 weeks from surgery  Stop date  12/14/18 Follow up clinic date  1-2 weeks   Leonel Ramsay, MD

## 2018-12-03 ENCOUNTER — Encounter: Payer: Self-pay | Admitting: Vascular Surgery

## 2018-12-03 ENCOUNTER — Telehealth: Payer: Self-pay | Admitting: Licensed Clinical Social Worker

## 2018-12-03 DIAGNOSIS — I70235 Atherosclerosis of native arteries of right leg with ulceration of other part of foot: Secondary | ICD-10-CM

## 2018-12-03 DIAGNOSIS — L97518 Non-pressure chronic ulcer of other part of right foot with other specified severity: Secondary | ICD-10-CM

## 2018-12-03 DIAGNOSIS — Z9862 Peripheral vascular angioplasty status: Secondary | ICD-10-CM

## 2018-12-03 LAB — BASIC METABOLIC PANEL
Anion gap: 7 (ref 5–15)
BUN: 33 mg/dL — ABNORMAL HIGH (ref 8–23)
CO2: 20 mmol/L — ABNORMAL LOW (ref 22–32)
Calcium: 8.2 mg/dL — ABNORMAL LOW (ref 8.9–10.3)
Chloride: 109 mmol/L (ref 98–111)
Creatinine, Ser: 1.7 mg/dL — ABNORMAL HIGH (ref 0.61–1.24)
GFR calc Af Amer: 42 mL/min — ABNORMAL LOW (ref >60)
GFR calc non Af Amer: 36 mL/min — ABNORMAL LOW (ref >60)
Glucose, Bld: 165 mg/dL — ABNORMAL HIGH (ref 70–99)
Potassium: 4.3 mmol/L (ref 3.5–5.1)
Sodium: 136 mmol/L (ref 135–145)

## 2018-12-03 LAB — GLUCOSE, CAPILLARY
Glucose-Capillary: 162 mg/dL — ABNORMAL HIGH (ref 70–99)
Glucose-Capillary: 201 mg/dL — ABNORMAL HIGH (ref 70–99)
Glucose-Capillary: 173 mg/dL — ABNORMAL HIGH (ref 70–99)
Glucose-Capillary: 184 mg/dL — ABNORMAL HIGH (ref 70–99)

## 2018-12-03 LAB — CULTURE, BLOOD (ROUTINE X 2)
Culture: NO GROWTH
Culture: NO GROWTH

## 2018-12-03 LAB — PROTIME-INR
INR: 1.85
Prothrombin Time: 21.1 seconds — ABNORMAL HIGH (ref 11.4–15.2)

## 2018-12-03 NOTE — Care Management (Addendum)
RNCM spoke with patient son by phone. He will come to [facility] for 10AM dose of Unasyn tomorrow 12/04/18- Herminio Commons RN with [facility] will either meet son at [facility] or bedside RN will need to meet with son; discharge patient to home if medically stable and home health RN to administer 1600 dose in the home. He agrees. RN/MD updated. Update: Per RN patient is very unsteady with a walker. RNCM spoke with son [name] again by phone to determine if patient needs a rolling walker- he has one.

## 2018-12-03 NOTE — Progress Notes (Signed)
Daily Progress Note   Subjective  - 1 Day Post-Op  Follow-up right first ray amputation.  Objective Vitals:   12/02/18 1457 12/02/18 1609 12/02/18 2349 12/03/18 0745  BP: (!) 160/79 127/79 (!) 157/90 (!) 174/99  Pulse: 63 87 65 63  Resp:   15   Temp: 98 F (36.7 C)  98.5 F (36.9 C) (!) 97.5 F (36.4 C)  TempSrc: Oral   Oral  SpO2: 99% 97% 98% 96%  Weight:      Height:        Physical Exam: Dressing change today.  Wound is healing nicely.  Small area of bloody drainage on the distal aspect of the incision.  No severe dehiscence.  No purulent drainage at this time.  Minimal erythema surrounding the area.  Laboratory CBC    Component Value Date/Time   WBC 9.5 12/01/2018 0421   HGB 10.6 (L) 12/01/2018 0421   HCT 33.3 (L) 12/01/2018 0421   PLT 237 12/01/2018 0421    BMET    Component Value Date/Time   NA 136 12/03/2018 0329   K 4.3 12/03/2018 0329   CL 109 12/03/2018 0329   CO2 20 (L) 12/03/2018 0329   GLUCOSE 165 (H) 12/03/2018 0329   BUN 33 (H) 12/03/2018 0329   CREATININE 1.70 (H) 12/03/2018 0329   CALCIUM 8.2 (L) 12/03/2018 0329   GFRNONAA 36 (L) 12/03/2018 0329   GFRAA 42 (L) 12/03/2018 0329    Assessment/Planning: Infection right foot status post partial first ray amputation.   Dressing changed today.  Patient needs to ambulate only in the OrthoWedge shoe.  Bathroom privileges with minimal ambulation.  Weight-bear to the heel only.  Otherwise no weight to the right foot.  Dressing changes: 3 times a week dressing change with dry bulky sterile dressing.  Can cleanse surrounding wound with saline.  Follow-up with Dr. Elvina Mattes in 2 weeks.  Antibiotics per infectious disease.  Okay to discharge from podiatry standpoint.  Samara Deist A  12/03/2018, 12:25 PM

## 2018-12-03 NOTE — Progress Notes (Signed)
[facility] Daily Progress Note   Subjective: 1 Day Post-Op: Ultrasound guidance for vascular access left femoral artery, Catheter placement into right SFA from left femoral approach, Aortogram and selective right lower extremity angiogram including selective images of the right posterior tibial, peroneal, and anterior tibial arteries, Percutaneous transluminal angioplasty of right peroneal artery with 3 mm diameter by 30 cm length angioplasty balloon and of the tibioperoneal trunk with 4 mm diameter by 8 cm length Lutonix drug-coated angioplasty balloon, Percutaneous transluminal angioplasty of the right posterior tibial artery with 2.5 mm diameter by 30 cm length angioplasty balloon, Percutaneous transluminal angioplasty of the right distal SFA and proximal popliteal artery with 6 mm diameter by 8 cm length Lutonix drug-coated angioplasty balloon with StarClose closure device left femoral artery  Patient without complaint this afternoon.  No issues overnight.  Objective: Vitals:   12/02/18 1457 12/02/18 1609 12/02/18 2349 12/03/18 0745  BP: (!) 160/79 127/79 (!) 157/90 (!) 174/99  Pulse: 63 87 65 63  Resp:   15   Temp: 98 F (36.7 C)  98.5 F (36.9 C) (!) 97.5 F (36.4 C)  TempSrc: Oral   Oral  SpO2: 99% 97% 98% 96%  Weight:      Height:        Intake/Output Summary (Last 24 hours) at 12/03/2018 1428 Last data filed at 12/03/2018 1300 Gross per 24 hour  Intake 1552.03 ml  Output 1900 ml  Net -347.97 ml   Physical Exam: A&Ox3, NAD CV: RRR Pulmonary: CTA Bilaterally Abdomen: Soft, Nontender, Nondistended Groin: Left access site.  Clean dry and intact.  No drainage.  No swelling. Vascular:  Right lower extremity: Thigh soft, calf soft.  Extremity is warm distally to the toes.  Laboratory: CBC    Component Value Date/Time   WBC 9.5 12/01/2018 0421   HGB 10.6 (L) 12/01/2018 0421   HCT 33.3 (L) 12/01/2018 0421   PLT 237 12/01/2018 0421   BMET     Component Value Date/Time   NA 136 12/03/2018 0329   K 4.3 12/03/2018 0329   CL 109 12/03/2018 0329   CO2 20 (L) 12/03/2018 0329   GLUCOSE 165 (H) 12/03/2018 0329   BUN 33 (H) 12/03/2018 0329   CREATININE 1.70 (H) 12/03/2018 0329   CALCIUM 8.2 (L) 12/03/2018 0329   GFRNONAA 36 (L) 12/03/2018 0329   GFRAA 42 (L) 12/03/2018 0329   Assessment/Planning: The patient is a 84 year old male with peripheral artery disease with ulceration, postop day #1 right lower extremity angiogram with intervention 1) from a vascular surgery standpoint the patient can be discharged home when medically stable. 2) patient to continue to receive local wound care. 3) patient to follow-up in our office in one month.  I have placed his follow-up information in his chart.  Discussed with Dr. Ellis Parents Hendrik Donath PA-C 12/03/2018 2:28 PM

## 2018-12-03 NOTE — Consult Note (Signed)
ANTICOAGULATION CONSULT NOTE - Initial Consult  Pharmacy Consult for warfarin Indication: VTE prophylaxis/FVL/PE history  No Known Allergies  Patient Measurements: Height: 6' (182.9 cm) Weight: 210 lb (95.3 kg) IBW/kg (Calculated) : 77.6  Vital Signs: Temp: 97.5 F (36.4 C) (12/19 0745) Temp Source: Oral (12/19 0745) BP: 174/99 (12/19 0745) Pulse Rate: 63 (12/19 0745)  Labs: Recent Labs    12/01/18 0421 12/02/18 0406 12/03/18 0329  HGB 10.6*  --   --   HCT 33.3*  --   --   PLT 237  --   --   LABPROT 31.9* 25.2* 21.1*  INR 3.15 2.33 1.85  CREATININE 1.78* 2.08* 1.70*    Estimated Creatinine Clearance: 38.1 mL/min (A) (by C-G formula based on SCr of 1.7 mg/dL (H)).   Medical History: Past Medical History:  Diagnosis Date  . Cancer ([facility])   . Chronic kidney disease   . Colon polyp   . Diabetes mellitus   . History of blood clotting disorder     Medications:  Warfarin   Assessment: Pharmacy consulted for heparin drip dosing and monitoring for 84 yo male with PMH of PE and factor V Leiden mutation. Patient is admitted with gangrene of right great toe. Planned first ray amputation surgery 12/14.  INR supratherapeutic on admission @ 3.95.  Goal of Therapy:  INR 2-3 Heparin level 0.3-0.7 units/ml Monitor platelets by anticoagulation protocol: Yes   WARFARIN COURSE  DATE INR DOSE 12/14 3.95 Held 12/15 4.4 Held 12/16 3.87 Held 12/17 3.15 Held 12/18 2.33 5 mg 12/19 1.85   Plan:  INR subtherapeutic due to holding several doses. Will continue home regimen which includes 5 mg po tonight. Pharmacy will continue to follow and adjust.   Laural Benes, PharmD, BCPS Clinical Pharmacist 12/03/2018 7:46 AM

## 2018-12-03 NOTE — Telephone Encounter (Signed)
I called the patient to schedule a hospital follow up and he was still in the hospital. He stated he may be discharged tomorrow. He wanted me to call him next week for an appointment so he can gather transportation.

## 2018-12-03 NOTE — Care Management Important Message (Signed)
Important Message  Patient Details  Name: Richard [name] MRN: [id number] Date of Birth: 1931-02-18   Medicare Important Message Given:  Yes    Juliann Pulse A Britanny Marksberry 12/03/2018, 11:27 AM

## 2018-12-03 NOTE — Discharge Instructions (Signed)
Vascular Surgery Discharge Instructions Please keep groins clean and dry.

## 2018-12-03 NOTE — Progress Notes (Signed)
[facility] at [place] NAME: Jalil Lorusso    MR#:  [id number]  DATE OF BIRTH:  [birth year]  SUBJECTIVE:   No other acute events overnight.  No fevers, chills.   REVIEW OF SYSTEMS:    Review of Systems  Constitutional: Negative for chills and fever.  HENT: Negative for congestion and tinnitus.   Eyes: Negative for blurred vision and double vision.  Respiratory: Negative for cough, shortness of breath and wheezing.   Cardiovascular: Negative for chest pain, orthopnea and PND.  Gastrointestinal: Negative for abdominal pain, diarrhea, nausea and vomiting.  Genitourinary: Negative for dysuria and hematuria.  Neurological: Negative for dizziness, sensory change and focal weakness.  All other systems reviewed and are negative.   Nutrition: Heart Healthy Tolerating Diet: Yes Tolerating PT: Eval noted.   DRUG ALLERGIES:  No Known Allergies  VITALS:  Blood pressure (!) 174/99, pulse 63, temperature (!) 97.5 F (36.4 C), temperature source Oral, resp. rate 15, height 6' (1.829 m), weight 95.3 kg, SpO2 96 %.  PHYSICAL EXAMINATION:   Physical Exam  GENERAL:  84 y.o.-year-old patient lying in bed in no acute distress.  EYES: Pupils equal, round, reactive to light and accommodation. No scleral icterus. Extraocular muscles intact.  HEENT: Head atraumatic, normocephalic. Oropharynx and nasopharynx clear.  NECK:  Supple, no jugular venous distention. No thyroid enlargement, no tenderness.  LUNGS: Normal breath sounds bilaterally, no wheezing, rales, rhonchi. No use of accessory muscles of respiration.  CARDIOVASCULAR: S1, S2 normal. No murmurs, rubs, or gallops.  ABDOMEN: Soft, nontender, nondistended. Bowel sounds present. No organomegaly or mass.  EXTREMITIES: No cyanosis, clubbing, edema b/l.  Right foot dressing in place from recent amputation.   NEUROLOGIC: Cranial nerves II through XII are intact. No focal Motor or sensory deficits  b/l.  Globally weak.  PSYCHIATRIC: The patient is alert and oriented x 3.  SKIN: No obvious rash, lesion, or ulcer.   Right upper Ext. PICC line in place.    LABORATORY PANEL:   CBC Recent Labs  Lab 12/01/18 0421  WBC 9.5  HGB 10.6*  HCT 33.3*  PLT 237   ------------------------------------------------------------------------------------------------------------------  Chemistries  Recent Labs  Lab 12/02/18 0406 12/03/18 0329  NA 134* 136  K 4.4 4.3  CL 108 109  CO2 19* 20*  GLUCOSE 196* 165*  BUN 40* 33*  CREATININE 2.08* 1.70*  CALCIUM 8.0* 8.2*  MG 1.7  --    ------------------------------------------------------------------------------------------------------------------  Cardiac Enzymes No results for input(s): TROPONINI in the last 168 hours. ------------------------------------------------------------------------------------------------------------------  RADIOLOGY:  No results found.   ASSESSMENT AND PLAN:   84 year old male with past medical history of diabetes, hypertension, chronic kidney disease, prostate cancer who presents to the hospital due to right foot pain and swelling and also a necrotic appearing right great toe.  1. Right foot/great toe osteomyelitis/necrosis-  Appreciate podiatry input status post amputation right Great Toe POD # 4 today.  Seen by podiatry today and dressing was changed. Dressing changes: 3 times a week dressing change with dry bulky sterile dressing.  Can cleanse surrounding wound with saline. - Cont. IV Unasyn. Wound cultures are growing enterococcus and staph simulans.  Appreciate infectious disease input and patient will need long-term IV antibiotics. S/p PICC line already.  - Patient will need home health with IV antibiotics until 12/14/18.  -Appreciate vascular surgery input and patient is status post right lower extremity angiogram with intervention done yesterday.   2. Diabetes type 2 with  neuropathy- continue  sliding scale Continue Amaryl. BS stable.   3. Essential hypertension- cont. Lisinopril, PRN hydralazine.   4. History of gout-no acute attack. Continue Uloric.  5. Hyperlipidemia-continue atorvastatin.  6. Anxiety-continue Xanax.  7. History of recurrent pulmonary embolism due to factor V Leiden mutation-  cont. Coumadin and INR subtherapeutic but no need for bridging for now.   Discharge Home with Home health tomorrow.  Discussed plan of care with patient's son over the phone and also with care management.  All the records are reviewed and case discussed with Care Management/Social Worker. Management plans discussed with the patient, family and they are in agreement.  CODE STATUS: DNR  DVT Prophylaxis: Coumadin  TOTAL TIME TAKING CARE OF THIS PATIENT: 30 minutes.   POSSIBLE D/C IN 1-2 DAYS, DEPENDING ON CLINICAL CONDITION.   Henreitta Leber M.D on 12/03/2018 at 2:15 PM  Between 7am to 6pm - Pager - [phone]  After 6pm go to [email or web address] - Proofreader  [facility]  Office  [phone]  CC: Primary care physician; [name], MD

## 2018-12-04 LAB — AEROBIC/ANAEROBIC CULTURE W GRAM STAIN (SURGICAL/DEEP WOUND): Gram Stain: NONE SEEN

## 2018-12-04 LAB — PROTIME-INR
INR: 1.75
Prothrombin Time: 20.2 seconds — ABNORMAL HIGH (ref 11.4–15.2)

## 2018-12-04 LAB — GLUCOSE, CAPILLARY
Glucose-Capillary: 154 mg/dL — ABNORMAL HIGH (ref 70–99)
Glucose-Capillary: 199 mg/dL — ABNORMAL HIGH (ref 70–99)
Glucose-Capillary: 109 mg/dL — ABNORMAL HIGH (ref 70–99)

## 2018-12-04 MED ORDER — AMPICILLIN-SULBACTAM IV (FOR PTA / DISCHARGE USE ONLY): 12.0000 g | INTRAVENOUS | 0 refills | Status: AC

## 2018-12-04 NOTE — Discharge Summary (Signed)
[facility] [place] NAME: Richard [name]    MR#:  [id number]  DATE OF BIRTH:  May 29, 1931  DATE OF ADMISSION:  11/28/2018 ADMITTING PHYSICIAN: Henreitta Leber, MD  DATE OF DISCHARGE: 12/04/2018  PRIMARY CARE PHYSICIAN: [name], MD    ADMISSION DIAGNOSIS:  Necrosis ([place]) [I96] Necrotic toes ([place]) [I96] Atrial fibrillation, unspecified type ([place]) [I48.91] Osteomyelitis of right foot, unspecified type ([place]) [M86.9]  DISCHARGE DIAGNOSIS:  Active Problems:   Acute osteomyelitis of toe of right foot ([place])   Pressure injury of skin   SECONDARY DIAGNOSIS:   Past Medical History:  Diagnosis Date  . Cancer ([place])   . Chronic kidney disease   . Colon polyp   . Diabetes mellitus   . History of blood clotting disorder     HOSPITAL COURSE:   84 year old male with past medical history of diabetes, hypertension, chronic kidney disease, prostate cancer who presents to the hospital due to right foot pain and swelling and also a necrotic appearing right great toe.  1. Right foot/great toe osteomyelitis/necrosis-patient was initially started on broad-spectrum IV antibiotics with vancomycin, Zosyn.  A podiatry consult was obtained. - seen by Podiatry and pt. Is now status post amputation right Great Toe POD # 5 today.  -Patient's wound cultures grew out enterococcus and staph simulans.  Seen by infectious disease and antibiotics were narrowed to just IV Unasyn.  Patient as per infectious disease need long-term antibiotics and therefore had a PICC line placed.  Patient received IV Unasyn until December 14, 2018. -Patient is set up with home health nursing services to help with antibiotics. Dressing changes: 3 times a week dressing change with dry bulky sterile dressing. Can cleanse surrounding wound with saline. - pt. Is currently afebrile, hemodynamically stable.  He will non-weight bearing on the right fore-foot and has been given a [place].     2. Diabetes type 2 with neuropathy-while in the hospital patient was on sliding scale insulin, but will resume Amaryl upon discharge.  3. Essential hypertension-pt. Will cont. Lisinopril.    4.History of gout-no acute attack. pt. Will Continue Uloric.  5. Hyperlipidemia- pt. Will continue atorvastatin.  6. Anxiety- pt. Will continue Xanax.  7. History of recurrent pulmonary embolism due to factor V Leiden mutation- cont. Coumadin to a goal INR of 2-3.  Pt. Is INR was 1.7 on discharge and can be further followed as outpatient.    DISCHARGE CONDITIONS:   Stable.   CONSULTS OBTAINED:    DRUG ALLERGIES:  No Known Allergies  DISCHARGE MEDICATIONS:   Allergies as of 12/04/2018   No Known Allergies     Medication List    STOP taking these medications   gentamicin cream 0.1 % Commonly known as:  GARAMYCIN     TAKE these medications   ADVIL PO Take by mouth.   Alpha-Lipoic Acid 200 MG Caps Take by mouth.   ALPRAZolam 0.25 MG tablet Commonly known as:  XANAX Take 0.25 mg by mouth at bedtime as needed.   ampicillin-sulbactam  IVPB Commonly known as:  UNASYN Inject 12 g into the vein daily for 12 days. Infuse as continuous infusion 12gm/24h Indication:  Diabetic foot infection with ostemyelitis Last Day of Therapy:  12/14/2018 Labs - Once weekly:  CBC/D and BMP, Labs - Every other week:  ESR and CRP   atorvastatin 10 MG tablet Commonly known as:  LIPITOR Take 10 mg by mouth daily.   CALCIUM 600 PO Take by mouth.  tadalafil 5 MG tablet Commonly known as:  CIALIS Take 5 mg by mouth every other day.   CIALIS 5 MG tablet Generic drug:  tadalafil TK 1/2 T PO QD   tadalafil 5 MG tablet Commonly known as:  CIALIS TK 1/2 T PO QD   Coenzyme Q10 10 MG capsule Take 10 mg by mouth.   colchicine 0.6 MG tablet Take 0.6 mg by mouth.   glimepiride 4 MG tablet Commonly known as:  AMARYL Take 4 mg by mouth daily with breakfast. What changed:   Another medication with the same name was removed. Continue taking this medication, and follow the directions you see here.   ibandronate 150 MG tablet Commonly known as:  BONIVA Take 150 mg by mouth every 30 (thirty) days. Take in the morning with a full glass of water, on an empty stomach, and do not take anything else by mouth or lie down for the next 30 min.   lisinopril 5 MG tablet Commonly known as:  PRINIVIL,ZESTRIL Take 5 mg by mouth daily.   MULTIVITAMIN ADULT PO Take by mouth.   MULTIVITAMIN ADULT EXTRA C PO Take by mouth.   nystatin-triamcinolone cream Commonly known as:  MYCOLOG II Apply to affected areas bid   Red Yeast Rice 600 MG Tabs Take 1 tablet by mouth daily.   ULORIC 80 MG Tabs Generic drug:  Febuxostat Take 1 tablet by mouth daily.   Vitamin A & D [address] units Tabs Take by mouth.   Vitamin D 50 MCG (2000 UT) tablet Take 2,000 Units by mouth daily.   warfarin 5 MG tablet Commonly known as:  COUMADIN Take 5 mg by mouth daily. Except MONDAYS and FRIDAYS.   warfarin 7.5 MG tablet Commonly known as:  COUMADIN Take 7.5 mg by mouth. On MONDAYS and [place] Infusion Instuctions  (From admission, onward)         Start     Ordered   12/04/18 0000  Home infusion instructions [facility] May follow [place] Dosing Protocol; May administer Cathflo as needed to maintain patency of vascular access device.; Flushing of vascular access device: per [facility] Protocol: 0.9% NaCl pre/post medica...    Question Answer Comment  Instructions May follow [place] Dosing Protocol   Instructions May administer Cathflo as needed to maintain patency of vascular access device.   Instructions Flushing of vascular access device: per [facility] Protocol: 0.9% NaCl pre/post medication administration and prn patency; Heparin 100 u/ml, 32m for implanted ports and Heparin 10u/ml, 531mfor all other central venous catheters.   Instructions May follow [facility]  Anaphylaxis Protocol for First Dose Administration in the home: 0.9% NaCl at 25-50 ml/hr to maintain IV access for protocol meds. Epinephrine 0.3 ml IV/IM PRN and Benadryl 25-50 IV/IM PRN s/s of anaphylaxis.   Instructions [facility] Infusion Coordinator (RN) to assist per patient IV care needs in the home PRN.      12/04/18 1042            DISCHARGE INSTRUCTIONS:   DIET:  Cardiac diet and Diabetic diet  DISCHARGE CONDITION:  Stable  ACTIVITY:  Activity as tolerated  OXYGEN:  Home Oxygen: No.   Oxygen Delivery: room air  DISCHARGE LOCATION:  Home with Home Health PT, RN.    If you experience worsening of your admission symptoms, develop shortness of breath, life threatening emergency, suicidal or homicidal thoughts you must seek medical attention immediately  by calling 911 or calling your MD immediately  if symptoms less severe.  You Must read complete instructions/literature along with all the possible adverse reactions/side effects for all the Medicines you take and that have been prescribed to you. Take any new Medicines after you have completely understood and accpet all the possible adverse reactions/side effects.   Please note  You were cared for by a hospitalist during your hospital stay. If you have any questions about your discharge medications or the care you received while you were in the hospital after you are discharged, you can call the unit and asked to speak with the hospitalist on call if the hospitalist that took care of you is not available. Once you are discharged, your primary care physician will handle any further medical issues. Please note that NO REFILLS for any discharge medications will be authorized once you are discharged, as it is imperative that you return to your primary care physician (or establish a relationship with a primary care physician if you do not have one) for your aftercare needs so that they can reassess your need for  medications and monitor your lab values.     Today   No acute events overnight, afebrile, hemodynamically stable.  Patient's son has been taught how to administer antibiotics with home health nursing services.  Will discharge home today with outpatient follow-up with podiatry.   VITAL SIGNS:  Blood pressure (!) 169/98, pulse 64, temperature 97.7 F (36.5 C), temperature source Oral, resp. rate 17, height 6' (1.829 m), weight 95.3 kg, SpO2 99 %.  I/O:    Intake/Output Summary (Last 24 hours) at 12/04/2018 1514 Last data filed at 12/04/2018 0252 Gross per 24 hour  Intake 291.84 ml  Output 700 ml  Net -408.16 ml    PHYSICAL EXAMINATION:   GENERAL:  84 y.o.-year-old patient lying in bed in no acute distress.  EYES: Pupils equal, round, reactive to light and accommodation. No scleral icterus. Extraocular muscles intact.  HEENT: Head atraumatic, normocephalic. Oropharynx and nasopharynx clear.  NECK:  Supple, no jugular venous distention. No thyroid enlargement, no tenderness.  LUNGS: Normal breath sounds bilaterally, no wheezing, rales, rhonchi. No use of accessory muscles of respiration.  CARDIOVASCULAR: S1, S2 normal. No murmurs, rubs, or gallops.  ABDOMEN: Soft, nontender, nondistended. Bowel sounds present. No organomegaly or mass.  EXTREMITIES: No cyanosis, clubbing, edema b/l.  Right foot dressing in place from recent amputation.   NEUROLOGIC: Cranial nerves II through XII are intact. No focal Motor or sensory deficits b/l.  Globally weak.  PSYCHIATRIC: The patient is alert and oriented x 3.  SKIN: No obvious rash, lesion, or ulcer.   Right upper Ext. PICC line in place.     DATA REVIEW:   CBC Recent Labs  Lab 12/01/18 0421  WBC 9.5  HGB 10.6*  HCT 33.3*  PLT 237    Chemistries  Recent Labs  Lab 12/02/18 0406 12/03/18 0329  NA 134* 136  K 4.4 4.3  CL 108 109  CO2 19* 20*  GLUCOSE 196* 165*  BUN 40* 33*  CREATININE 2.08* 1.70*  CALCIUM 8.0* 8.2*  MG  1.7  --     Cardiac Enzymes No results for input(s): TROPONINI in the last 168 hours.  Microbiology Results  Results for orders placed or performed during the hospital encounter of 11/28/18  Blood Culture (routine x 2)     Status: None   Collection Time: 11/28/18  4:15 PM  Result Value Ref Range Status  Specimen Description BLOOD R HAND  Final   Special Requests   Final    BOTTLES DRAWN AEROBIC AND ANAEROBIC Blood Culture results may not be optimal due to an excessive volume of blood received in culture bottles   Culture   Final    NO GROWTH 5 DAYS Performed at [facility], [address] [place], [place] [address]    Report Status 12/03/2018 FINAL  Final  Blood Culture (routine x 2)     Status: None   Collection Time: 11/28/18  4:15 PM  Result Value Ref Range Status   Specimen Description BLOOD R WRIST  Final   Special Requests   Final    BOTTLES DRAWN AEROBIC AND ANAEROBIC Blood Culture results may not be optimal due to an excessive volume of blood received in culture bottles   Culture   Final    NO GROWTH 5 DAYS Performed at [facility], [address] [place], [place] [address]    Report Status 12/03/2018 FINAL  Final  Aerobic/Anaerobic Culture (surgical/deep wound)     Status: None (Preliminary result)   Collection Time: 11/28/18  6:14 PM  Result Value Ref Range Status   Specimen Description   Final    TOE Performed at [facility], [address], [place], [place] [address]    Special Requests   Final    RIGHT Performed at [facility], [address], [place] [address]    Gram Stain NO WBC SEEN RARE GRAM POSITIVE COCCI IN PAIRS   Final   Culture   Final    MODERATE STAPHYLOCOCCUS SPECIES (COAGULASE NEGATIVE) MODERATE ENTEROCOCCUS FAECALIS    Report Status PENDING  Incomplete   Organism ID, Bacteria STAPHYLOCOCCUS SPECIES (COAGULASE NEGATIVE)  Final   Organism ID, Bacteria ENTEROCOCCUS FAECALIS  Final       Susceptibility   Enterococcus faecalis - MIC*    AMPICILLIN <=2 SENSITIVE Sensitive     VANCOMYCIN <=0.5 SENSITIVE Sensitive     GENTAMICIN SYNERGY RESISTANT Resistant     * MODERATE ENTEROCOCCUS FAECALIS   Staphylococcus species (coagulase negative) - MIC*    CIPROFLOXACIN <=0.5 SENSITIVE Sensitive     ERYTHROMYCIN 4 INTERMEDIATE Intermediate     GENTAMICIN >=16 RESISTANT Resistant     OXACILLIN <=0.25 SENSITIVE Sensitive     TETRACYCLINE <=1 SENSITIVE Sensitive     VANCOMYCIN <=0.5 SENSITIVE Sensitive     TRIMETH/SULFA <=10 SENSITIVE Sensitive     CLINDAMYCIN <=0.25 SENSITIVE Sensitive     RIFAMPIN <=0.5 SENSITIVE Sensitive     Inducible Clindamycin NEGATIVE Sensitive     * MODERATE STAPHYLOCOCCUS SPECIES (COAGULASE NEGATIVE)  Surgical pcr screen     Status: None   Collection Time: 11/29/18  6:30 AM  Result Value Ref Range Status   MRSA, PCR NEGATIVE NEGATIVE Final   Staphylococcus aureus NEGATIVE NEGATIVE Final    Comment: (NOTE) The Xpert SA Assay (FDA approved for NASAL specimens in patients 1 years of age and older), is one component of a comprehensive surveillance program. It is not intended to diagnose infection nor to guide or monitor treatment. Performed at [facility], [address] [place], [place] [address]   Aerobic/Anaerobic Culture (surgical/deep wound)     Status: None (Preliminary result)   Collection Time: 11/29/18  8:37 AM  Result Value Ref Range Status   Specimen Description BONE RIGHT TOE GREAT  Final   Special Requests PATIENT ON FOLLOWING VANC ZOSYN CEFEPIME  Final   Gram Stain  Final    ABUNDANT WBC PRESENT, PREDOMINANTLY PMN FEW GRAM POSITIVE COCCI    Culture   Final    MODERATE STAPHYLOCOCCUS SIMULANS MODERATE ENTEROCOCCUS FAECALIS NO ANAEROBES ISOLATED; CULTURE IN PROGRESS FOR 5 DAYS    Report Status PENDING  Incomplete   Organism ID, Bacteria STAPHYLOCOCCUS SIMULANS  Final   Organism ID, Bacteria ENTEROCOCCUS FAECALIS  Final       Susceptibility   Enterococcus faecalis - MIC*    AMPICILLIN <=2 SENSITIVE Sensitive     VANCOMYCIN 1 SENSITIVE Sensitive     GENTAMICIN SYNERGY RESISTANT Resistant     * MODERATE ENTEROCOCCUS FAECALIS   Staphylococcus simulans - MIC*    CIPROFLOXACIN <=0.5 SENSITIVE Sensitive     ERYTHROMYCIN 1 INTERMEDIATE Intermediate     GENTAMICIN >=16 RESISTANT Resistant     OXACILLIN <=0.25 SENSITIVE Sensitive     TETRACYCLINE <=1 SENSITIVE Sensitive     VANCOMYCIN <=0.5 SENSITIVE Sensitive     TRIMETH/SULFA <=10 SENSITIVE Sensitive     CLINDAMYCIN <=0.25 SENSITIVE Sensitive     RIFAMPIN <=0.5 SENSITIVE Sensitive     Inducible Clindamycin Value in next row Sensitive      NEGATIVEPerformed at [facility] [address] [place], [place] [address]    * MODERATE STAPHYLOCOCCUS SIMULANS    RADIOLOGY:  No results found.    Management plans discussed with the patient, family and they are in agreement.  CODE STATUS:     Code Status Orders  (From admission, onward)         Start     Ordered   11/29/18 1202  Do not attempt resuscitation (DNR)  Continuous    Question Answer Comment  In the event of cardiac or respiratory ARREST Do not call a "code blue"   In the event of cardiac or respiratory ARREST Do not perform Intubation, CPR, defibrillation or ACLS   In the event of cardiac or respiratory ARREST Use medication by any route, position, wound care, and other measures to relive pain and suffering. May use oxygen, suction and manual treatment of airway obstruction as needed for comfort.      11/29/18 1201         TOTAL TIME TAKING CARE OF THIS PATIENT: 40 minutes.    Henreitta Leber M.D on 12/04/2018 at 3:14 PM  Between 7am to 6pm - Pager - [phone]  After 6pm go to [email or web address] - Proofreader  [facility]  Office  [phone]  CC: Primary care physician; [name], MD

## 2018-12-04 NOTE — Plan of Care (Signed)

## 2018-12-04 NOTE — Consult Note (Signed)
ANTICOAGULATION CONSULT NOTE - Initial Consult  Pharmacy Consult for warfarin Indication: VTE prophylaxis/FVL/PE history  No Known Allergies  Patient Measurements: Height: 6' (182.9 cm) Weight: 210 lb (95.3 kg) IBW/kg (Calculated) : 77.6  Vital Signs: Temp: 97.7 F (36.5 C) (12/20 0738) Temp Source: Oral (12/20 0738) BP: 169/98 (12/20 0738) Pulse Rate: 64 (12/20 0738)  Labs: Recent Labs    12/02/18 0406 12/03/18 0329 12/04/18 0721  LABPROT 25.2* 21.1* 20.2*  INR 2.33 1.85 1.75  CREATININE 2.08* 1.70*  --     Estimated Creatinine Clearance: 38.1 mL/min (A) (by C-G formula based on SCr of 1.7 mg/dL (H)).   Medical History: Past Medical History:  Diagnosis Date  . Cancer ([place])   . Chronic kidney disease   . Colon polyp   . Diabetes mellitus   . History of blood clotting disorder     Medications:  Warfarin   Assessment: Pharmacy consulted for heparin drip dosing and monitoring for 84 yo male with PMH of PE and factor V Leiden mutation. Patient is admitted with gangrene of right great toe. Planned first ray amputation surgery 12/14.  INR supratherapeutic on admission @ 3.95.  Goal of Therapy:  INR 2-3 Heparin level 0.3-0.7 units/ml Monitor platelets by anticoagulation protocol: Yes   WARFARIN COURSE  DATE INR DOSE 12/14 3.95 Held 12/15 4.4 Held 12/16 3.87 Held 12/17 3.15 Held 12/18 2.33 5 mg 12/19 1.85 5mg  12/20 1.75   Plan:  INR subtherapeutic due to holding several doses. Will continue home regimen which includes 7.5 mg po tonight. Pharmacy will continue to follow and adjust.   Pernell Dupre, PharmD, BCPS Clinical Pharmacist 12/04/2018 3:21 PM

## 2018-12-04 NOTE — Care Management (Signed)
Bedside instruction delivered by [facility] nurse to patient and his son. Son will deliver home IV PM dose today and home health RN will see patient in AM 12/05/18.  Corene Cornea with [facility] aware of discharge. No other RNCM needs.

## 2018-12-05 DIAGNOSIS — N189 Chronic kidney disease, unspecified: Secondary | ICD-10-CM | POA: Diagnosis not present

## 2018-12-05 DIAGNOSIS — D682 Hereditary deficiency of other clotting factors: Secondary | ICD-10-CM | POA: Diagnosis not present

## 2018-12-05 DIAGNOSIS — Z79899 Other long term (current) drug therapy: Secondary | ICD-10-CM | POA: Diagnosis not present

## 2018-12-05 DIAGNOSIS — E1169 Type 2 diabetes mellitus with other specified complication: Secondary | ICD-10-CM | POA: Diagnosis not present

## 2018-12-05 DIAGNOSIS — Z4781 Encounter for orthopedic aftercare following surgical amputation: Secondary | ICD-10-CM | POA: Diagnosis not present

## 2018-12-05 DIAGNOSIS — E1140 Type 2 diabetes mellitus with diabetic neuropathy, unspecified: Secondary | ICD-10-CM | POA: Diagnosis not present

## 2018-12-05 DIAGNOSIS — Z792 Long term (current) use of antibiotics: Secondary | ICD-10-CM | POA: Diagnosis not present

## 2018-12-05 DIAGNOSIS — Z452 Encounter for adjustment and management of vascular access device: Secondary | ICD-10-CM | POA: Diagnosis not present

## 2018-12-05 DIAGNOSIS — Z794 Long term (current) use of insulin: Secondary | ICD-10-CM | POA: Diagnosis not present

## 2018-12-05 DIAGNOSIS — M86171 Other acute osteomyelitis, right ankle and foot: Secondary | ICD-10-CM | POA: Diagnosis not present

## 2018-12-05 DIAGNOSIS — I700 Atherosclerosis of aorta: Secondary | ICD-10-CM | POA: Diagnosis not present

## 2018-12-05 DIAGNOSIS — F419 Anxiety disorder, unspecified: Secondary | ICD-10-CM | POA: Diagnosis not present

## 2018-12-05 DIAGNOSIS — Z86711 Personal history of pulmonary embolism: Secondary | ICD-10-CM | POA: Diagnosis not present

## 2018-12-05 DIAGNOSIS — L89311 Pressure ulcer of right buttock, stage 1: Secondary | ICD-10-CM | POA: Diagnosis not present

## 2018-12-07 DIAGNOSIS — M86171 Other acute osteomyelitis, right ankle and foot: Secondary | ICD-10-CM | POA: Diagnosis not present

## 2018-12-07 DIAGNOSIS — E1169 Type 2 diabetes mellitus with other specified complication: Secondary | ICD-10-CM | POA: Diagnosis not present

## 2018-12-07 DIAGNOSIS — A4901 Methicillin susceptible Staphylococcus aureus infection, unspecified site: Secondary | ICD-10-CM | POA: Diagnosis not present

## 2018-12-07 DIAGNOSIS — E0852 Diabetes mellitus due to underlying condition with diabetic peripheral angiopathy with gangrene: Secondary | ICD-10-CM | POA: Diagnosis not present

## 2018-12-07 DIAGNOSIS — Z4781 Encounter for orthopedic aftercare following surgical amputation: Secondary | ICD-10-CM | POA: Diagnosis not present

## 2018-12-07 DIAGNOSIS — L89311 Pressure ulcer of right buttock, stage 1: Secondary | ICD-10-CM | POA: Diagnosis not present

## 2018-12-07 DIAGNOSIS — Z452 Encounter for adjustment and management of vascular access device: Secondary | ICD-10-CM | POA: Diagnosis not present

## 2018-12-07 DIAGNOSIS — E1140 Type 2 diabetes mellitus with diabetic neuropathy, unspecified: Secondary | ICD-10-CM | POA: Diagnosis not present

## 2018-12-07 DIAGNOSIS — E11621 Type 2 diabetes mellitus with foot ulcer: Secondary | ICD-10-CM | POA: Diagnosis not present

## 2018-12-10 DIAGNOSIS — M86171 Other acute osteomyelitis, right ankle and foot: Secondary | ICD-10-CM | POA: Diagnosis not present

## 2018-12-10 DIAGNOSIS — E1169 Type 2 diabetes mellitus with other specified complication: Secondary | ICD-10-CM | POA: Diagnosis not present

## 2018-12-10 DIAGNOSIS — Z452 Encounter for adjustment and management of vascular access device: Secondary | ICD-10-CM | POA: Diagnosis not present

## 2018-12-10 DIAGNOSIS — L89311 Pressure ulcer of right buttock, stage 1: Secondary | ICD-10-CM | POA: Diagnosis not present

## 2018-12-10 DIAGNOSIS — E1140 Type 2 diabetes mellitus with diabetic neuropathy, unspecified: Secondary | ICD-10-CM | POA: Diagnosis not present

## 2018-12-10 DIAGNOSIS — Z4781 Encounter for orthopedic aftercare following surgical amputation: Secondary | ICD-10-CM | POA: Diagnosis not present

## 2018-12-11 DIAGNOSIS — M86171 Other acute osteomyelitis, right ankle and foot: Secondary | ICD-10-CM | POA: Diagnosis not present

## 2018-12-11 DIAGNOSIS — E1169 Type 2 diabetes mellitus with other specified complication: Secondary | ICD-10-CM | POA: Diagnosis not present

## 2018-12-11 DIAGNOSIS — Z4781 Encounter for orthopedic aftercare following surgical amputation: Secondary | ICD-10-CM | POA: Diagnosis not present

## 2018-12-11 DIAGNOSIS — Z452 Encounter for adjustment and management of vascular access device: Secondary | ICD-10-CM | POA: Diagnosis not present

## 2018-12-11 DIAGNOSIS — L89311 Pressure ulcer of right buttock, stage 1: Secondary | ICD-10-CM | POA: Diagnosis not present

## 2018-12-11 DIAGNOSIS — E1140 Type 2 diabetes mellitus with diabetic neuropathy, unspecified: Secondary | ICD-10-CM | POA: Diagnosis not present

## 2018-12-14 DIAGNOSIS — Z4781 Encounter for orthopedic aftercare following surgical amputation: Secondary | ICD-10-CM | POA: Diagnosis not present

## 2018-12-14 DIAGNOSIS — Z452 Encounter for adjustment and management of vascular access device: Secondary | ICD-10-CM | POA: Diagnosis not present

## 2018-12-14 DIAGNOSIS — I96 Gangrene, not elsewhere classified: Secondary | ICD-10-CM | POA: Diagnosis not present

## 2018-12-14 DIAGNOSIS — M86171 Other acute osteomyelitis, right ankle and foot: Secondary | ICD-10-CM | POA: Diagnosis not present

## 2018-12-14 DIAGNOSIS — S98131A Complete traumatic amputation of one right lesser toe, initial encounter: Secondary | ICD-10-CM | POA: Diagnosis not present

## 2018-12-14 DIAGNOSIS — E1169 Type 2 diabetes mellitus with other specified complication: Secondary | ICD-10-CM | POA: Diagnosis not present

## 2018-12-14 DIAGNOSIS — E1140 Type 2 diabetes mellitus with diabetic neuropathy, unspecified: Secondary | ICD-10-CM | POA: Diagnosis not present

## 2018-12-14 DIAGNOSIS — L89311 Pressure ulcer of right buttock, stage 1: Secondary | ICD-10-CM | POA: Diagnosis not present

## 2018-12-15 ENCOUNTER — Ambulatory Visit: Payer: Medicare Other | Attending: Infectious Diseases | Admitting: Infectious Diseases

## 2018-12-15 ENCOUNTER — Encounter: Payer: Self-pay | Admitting: Infectious Diseases

## 2018-12-15 VITALS — BP 153/81 | HR 80

## 2018-12-15 DIAGNOSIS — N189 Chronic kidney disease, unspecified: Secondary | ICD-10-CM | POA: Diagnosis not present

## 2018-12-15 DIAGNOSIS — Z89411 Acquired absence of right great toe: Secondary | ICD-10-CM

## 2018-12-15 DIAGNOSIS — E1122 Type 2 diabetes mellitus with diabetic chronic kidney disease: Secondary | ICD-10-CM

## 2018-12-15 DIAGNOSIS — E1169 Type 2 diabetes mellitus with other specified complication: Secondary | ICD-10-CM | POA: Diagnosis not present

## 2018-12-15 DIAGNOSIS — E11628 Type 2 diabetes mellitus with other skin complications: Secondary | ICD-10-CM

## 2018-12-15 DIAGNOSIS — D6851 Activated protein C resistance: Secondary | ICD-10-CM | POA: Diagnosis not present

## 2018-12-15 DIAGNOSIS — E1151 Type 2 diabetes mellitus with diabetic peripheral angiopathy without gangrene: Secondary | ICD-10-CM | POA: Diagnosis not present

## 2018-12-15 DIAGNOSIS — Z95828 Presence of other vascular implants and grafts: Secondary | ICD-10-CM | POA: Diagnosis not present

## 2018-12-15 DIAGNOSIS — M86171 Other acute osteomyelitis, right ankle and foot: Secondary | ICD-10-CM | POA: Diagnosis not present

## 2018-12-15 DIAGNOSIS — Z9862 Peripheral vascular angioplasty status: Secondary | ICD-10-CM

## 2018-12-15 DIAGNOSIS — Z8631 Personal history of diabetic foot ulcer: Secondary | ICD-10-CM | POA: Diagnosis not present

## 2018-12-15 DIAGNOSIS — Z452 Encounter for adjustment and management of vascular access device: Secondary | ICD-10-CM | POA: Diagnosis not present

## 2018-12-15 DIAGNOSIS — T8781 Dehiscence of amputation stump: Secondary | ICD-10-CM

## 2018-12-15 DIAGNOSIS — Z4781 Encounter for orthopedic aftercare following surgical amputation: Secondary | ICD-10-CM | POA: Diagnosis not present

## 2018-12-15 DIAGNOSIS — L89311 Pressure ulcer of right buttock, stage 1: Secondary | ICD-10-CM | POA: Diagnosis not present

## 2018-12-15 DIAGNOSIS — L089 Local infection of the skin and subcutaneous tissue, unspecified: Principal | ICD-10-CM

## 2018-12-15 DIAGNOSIS — E1140 Type 2 diabetes mellitus with diabetic neuropathy, unspecified: Secondary | ICD-10-CM | POA: Diagnosis not present

## 2018-12-15 MED ORDER — AMOXICILLIN-POT CLAVULANATE 875-125 MG PO TABS: 1.0000 | ORAL_TABLET | Freq: Two times a day (BID) | ORAL | 0 refills | Status: DC

## 2018-12-15 NOTE — Patient Instructions (Addendum)
You are here for follow up of the foot infection ( rt) for which you had suregry and has been on unasyn_ you will complete today the IV- will do oral  augmemtin 1 tablet in the morning and one in the evening for 2 weeks . Follow up with Dr.Troxler

## 2018-12-15 NOTE — Progress Notes (Signed)
NAME: Richard [name]  DOB: 1931/11/20  MRN: [id number]  Date/Time: 12/15/2018 11:45 AM Subjective:  Follow up after hospital discharge ? Richard [name] is a 84 y.o. 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  On 11/29/2018 he underwent first ray amputation on the cultures were positive for enterococcus staph simulans and he was started on Unasyn.  On 12/02/2018 he underwent a percutaneous transluminal angioplasty and had balloon angioplasty of the right posterior tibial artery, right distal SFA and proximal popliteal artery .  He was discharged home on Unasyn on12/20/2019 and to continue it for 2 more weeks as it was a therapeutic amputation.  He saw Dr. Elvina Mattes yesterday in his office.  And there was a small dehiscence at the upper end.  Dr. Elvina Mattes had communicated with me that he will need another 2 weeks of oral antibiotics.  Patient patient's son has been giving him IV antibiotics and is been pretty tired doing it every 6 hours.  Today was his last day of IV antibiotic. Both patient and son are ready to get the PICC line out. Patient states he is doing well.  He does not have any fever or chills.  The foot is not swollen or painful.  Past Medical History:  Diagnosis Date  . Cancer (HCC)prostate   . Chronic kidney disease   . Colon polyp   . Diabetes mellitus   . History of blood clotting disorder     Past Surgical History:  Procedure Laterality Date  . AMPUTATION TOE Right 11/29/2018   Procedure: AMPUTATION TOE;  Surgeon: Albertine Patricia, DPM;  Location: [facility] ORS;  Service: Podiatry;  Laterality: Right;  . EYE SURGERY     CATARACTS  . HERNIA REPAIR    . LOWER EXTREMITY ANGIOGRAPHY Right 12/02/2018   Procedure: Lower Extremity Angiography;  Surgeon: Algernon Huxley, MD;  Location: [facility];  Service:  Cardiovascular;  Laterality: Right;  . SKIN CANCER EXCISION      Social History   Socioeconomic History  . Marital status: Widowed    Spouse name: Not on file  . Number of children: Not on file  . Years of education: Not on file  . Highest education level: Not on file  Occupational History  . Occupation: retired    Comment: Theatre manager  . 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 Financial resource strain: Not very hard  . Food insecurity:    Worry: Not on file    Inability: Not on file  . Transportation needs:    Medical: No    Non-medical: No  Tobacco Use  . Smoking status: Never Smoker  . Smokeless tobacco: Never Used  Substance and Sexual Activity  . Alcohol use: No  . Drug use: Never  . Sexual activity: Not Currently  Lifestyle  . Physical activity:    Days per week: 0 days    Minutes per session: Not on file  . Stress: Not on file  Relationships  . Social connections:    Talks on phone: More than three times a week    Gets together: More than three times a week    Attends religious service: Not on file    Active member of club or organization: Not on file    Attends meetings  of clubs or organizations: Not on file    Relationship status: Widowed  . Intimate partner violence:    Fear of current or ex partner: No    Emotionally abused: No    Physically abused: No    Forced sexual activity: No  Other Topics Concern  . Not on file  Social History Narrative  . Not on file    No family history on file. No Known Allergies ? Current Outpatient Medications  Medication Sig Dispense Refill  . Alpha-Lipoic Acid 200 MG CAPS Take by mouth.    . ALPRAZolam (XANAX) 0.25 MG tablet Take 0.25 mg by mouth at bedtime as needed.      Marland Kitchen ampicillin-sulbactam (UNASYN) IVPB Inject 12 g into the vein daily for 12 days. Infuse as continuous infusion 12gm/24h Indication:  Diabetic foot infection with ostemyelitis Last Day of Therapy:   12/14/2018 Labs - Once weekly:  CBC/D and BMP, Labs - Every other week:  ESR and CRP 12 Units 0  . atorvastatin (LIPITOR) 10 MG tablet Take 10 mg by mouth daily.      . Calcium Carbonate (CALCIUM 600 PO) Take by mouth.      . Cholecalciferol (VITAMIN D) 2000 UNITS tablet Take 2,000 Units by mouth daily.      Marland Kitchen CIALIS 5 MG tablet TK 1/2 T PO QD  11  . Coenzyme Q10 10 MG capsule Take 10 mg by mouth.    . colchicine 0.6 MG tablet Take 0.6 mg by mouth.    . Febuxostat (ULORIC) 80 MG TABS Take 1 tablet by mouth daily.     Marland Kitchen glimepiride (AMARYL) 4 MG tablet Take 4 mg by mouth daily with breakfast.   11  . ibandronate (BONIVA) 150 MG tablet Take 150 mg by mouth every 30 (thirty) days. Take in the morning with a full glass of water, on an empty stomach, and do not take anything else by mouth or lie down for the next 30 min.     . Ibuprofen (ADVIL PO) Take by mouth.      Marland Kitchen lisinopril (PRINIVIL,ZESTRIL) 5 MG tablet Take 5 mg by mouth daily.      . Multiple Vitamins-Minerals (MULTIVITAMIN ADULT EXTRA C PO) Take by mouth.    . Multiple Vitamins-Minerals (MULTIVITAMIN ADULT PO) Take by mouth.    . nystatin-triamcinolone (MYCOLOG II) cream Apply to affected areas bid    . Red Yeast Rice 600 MG TABS Take 1 tablet by mouth daily.     . tadalafil (CIALIS) 5 MG tablet Take 5 mg by mouth every other day.     . tadalafil (CIALIS) 5 MG tablet TK 1/2 T PO QD  4  . Vitamins A & D (VITAMIN A & D) [address] UNITS TABS Take by mouth.      . warfarin (COUMADIN) 5 MG tablet Take 5 mg by mouth daily. Except MONDAYS and FRIDAYS.    Marland Kitchen warfarin (COUMADIN) 7.5 MG tablet Take 7.5 mg by mouth. On MONDAYS and FRIDAYS     No current facility-administered medications for this visit.      Abtx:  Anti-infectives (From admission, onward)   None      REVIEW OF SYSTEMS:  Const: negative fever, negative chills, negative weight loss Eyes: negative diplopia or visual changes, negative eye pain ENT: negative coryza, negative  sore throat Resp: negative cough, hemoptysis, dyspnea Cards: negative for chest pain, palpitations, lower extremity edema BD:ZHGD catheterization X 4 /day GI: Negative for abdominal pain, diarrhea, bleeding, constipation  Skin: negative for rash and pruritus Heme: negative for easy bruising and gum/nose bleeding MS: negative for myalgias, arthralgias, back pain and muscle weakness Neurolo:negative for headaches, dizziness, vertigo, memory problems  Psych: negative for feelings of anxiety, depression  Endocrine: no polyuria Allergy/Immunology- negative for any medication or food allergies ? Objective:  VITALS:  BP (!) 153/81 (BP Location: Left Arm, Patient Position: Sitting, Cuff Size: Normal)   Pulse 80  PHYSICAL EXAM:  General: Alert, cooperative, no distress, appears stated age.  Head: Normocephalic, without obvious abnormality, atraumatic. Eyes: Conjunctivae clear, anicteric sclerae. Pupils are equal ENT Nares normal. No drainage or sinus tenderness. Lips, mucosa, and tongue normal. No Thrush Neck: Supple, symmetrical, no adenopathy, thyroid: non tender no carotid bruit and no JVD. Back: No CVA tenderness. Lungs: Clear to auscultation bilaterally. No Wheezing or Rhonchi. No rales. Heart: Regular rate and rhythm, no murmur, rub or gallop. Abdomen: Soft, non-tender,not distended. Bowel sounds normal. No masses Extremities:    Rt foot 1st toe amputation- wound due to dehisence at the distal end . Clean base Skin: No rashes or lesions. Or bruising Lymph: Cervical, supraclavicular normal. Neurologic: Grossly non-focal Pertinent Labs Lab Results CBC    Component Value Date/Time   WBC 9.5 12/01/2018 0421   RBC 3.45 (L) 12/01/2018 0421   HGB 10.6 (L) 12/01/2018 0421   HCT 33.3 (L) 12/01/2018 0421   PLT 237 12/01/2018 0421   MCV 96.5 12/01/2018 0421   MCH 30.7 12/01/2018 0421   MCHC 31.8 12/01/2018 0421   RDW 13.0 12/01/2018 0421   LYMPHSABS 2.1 11/28/2018 1502   MONOABS  1.2 (H) 11/28/2018 1502   EOSABS 0.4 11/28/2018 1502   BASOSABS 0.0 11/28/2018 1502    CMP Latest Ref Rng & Units 12/03/2018 12/02/2018 12/01/2018  Glucose 70 - 99 mg/dL 165(H) 196(H) 192(H)  BUN 8 - 23 mg/dL 33(H) 40(H) 37(H)  Creatinine 0.61 - 1.24 mg/dL 1.70(H) 2.08(H) 1.78(H)  Sodium 135 - 145 mmol/L 136 134(L) 134(L)  Potassium 3.5 - 5.1 mmol/L 4.3 4.4 4.1  Chloride 98 - 111 mmol/L 109 108 107  CO2 22 - 32 mmol/L 20(L) 19(L) 21(L)  Calcium 8.9 - 10.3 mg/dL 8.2(L) 8.0(L) 7.6(L)   Labs from  12/07/18 CRP 11 (0-10) Cr 1.47   Microbiology: No results found for this or any previous visit (from the past 240 hour(s)). IMAGING RESULTS: ? Impression/Recommendation ? 84 y.o. 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  On 11/29/2018 he underwent first ray amputation on the cultures were positive for enterococcus staph simulans and he was started on Unasyn.  On 12/02/2018 he underwent a percutaneous transluminal angioplasty and had balloon angioplasty of the right posterior tibial artery, right distal SFA and proximal popliteal artery .  He was discharged home on Unasyn on12/20/2019 and to continue it for 2 more weeks as it was a therapeutic amputation.  ? ?Rt foot gangrenous toe  infection- s/p amputation- on IV unasyn and finishing today as the amputation is therapeutic and no residual osteo, Will be treating the soft tissue residual infection with Po augmentin. For 2 more weeks  CKD stable  DM-  ___________________________________________________ Discussed with patient, and Dr.Troxler

## 2018-12-17 ENCOUNTER — Telehealth: Payer: Self-pay | Admitting: Licensed Clinical Social Worker

## 2018-12-17 DIAGNOSIS — L89311 Pressure ulcer of right buttock, stage 1: Secondary | ICD-10-CM | POA: Diagnosis not present

## 2018-12-17 DIAGNOSIS — M86171 Other acute osteomyelitis, right ankle and foot: Secondary | ICD-10-CM | POA: Diagnosis not present

## 2018-12-17 DIAGNOSIS — E1169 Type 2 diabetes mellitus with other specified complication: Secondary | ICD-10-CM | POA: Diagnosis not present

## 2018-12-17 DIAGNOSIS — Z4781 Encounter for orthopedic aftercare following surgical amputation: Secondary | ICD-10-CM | POA: Diagnosis not present

## 2018-12-17 DIAGNOSIS — E1140 Type 2 diabetes mellitus with diabetic neuropathy, unspecified: Secondary | ICD-10-CM | POA: Diagnosis not present

## 2018-12-17 DIAGNOSIS — Z452 Encounter for adjustment and management of vascular access device: Secondary | ICD-10-CM | POA: Diagnosis not present

## 2018-12-17 NOTE — Telephone Encounter (Signed)
Patient's son [name] called and wanted to know about getting his Dad a wound vac that was recommended by Dr. Delaine Lame. I let him know that the order with [place] would need to come from Dr. Elvina Mattes. I called Dr. Selina Cooley office and asked if they could call [facility] to order wound vac for the patient. Patient's Dad stated that he would follow up with Dr. Selina Cooley office.

## 2018-12-17 NOTE — Telephone Encounter (Signed)
I spoke with Richard [name] at Dr. Selina Cooley office and they started the wound vac referral on Monday 12/31 the day he was seen in their office. Richard [name] will follow up on the process.

## 2018-12-18 DIAGNOSIS — E1140 Type 2 diabetes mellitus with diabetic neuropathy, unspecified: Secondary | ICD-10-CM | POA: Diagnosis not present

## 2018-12-18 DIAGNOSIS — Z4781 Encounter for orthopedic aftercare following surgical amputation: Secondary | ICD-10-CM | POA: Diagnosis not present

## 2018-12-18 DIAGNOSIS — Z452 Encounter for adjustment and management of vascular access device: Secondary | ICD-10-CM | POA: Diagnosis not present

## 2018-12-18 DIAGNOSIS — L89311 Pressure ulcer of right buttock, stage 1: Secondary | ICD-10-CM | POA: Diagnosis not present

## 2018-12-18 DIAGNOSIS — E1169 Type 2 diabetes mellitus with other specified complication: Secondary | ICD-10-CM | POA: Diagnosis not present

## 2018-12-18 DIAGNOSIS — M86171 Other acute osteomyelitis, right ankle and foot: Secondary | ICD-10-CM | POA: Diagnosis not present

## 2018-12-21 DIAGNOSIS — E1169 Type 2 diabetes mellitus with other specified complication: Secondary | ICD-10-CM | POA: Diagnosis not present

## 2018-12-21 DIAGNOSIS — Z452 Encounter for adjustment and management of vascular access device: Secondary | ICD-10-CM | POA: Diagnosis not present

## 2018-12-21 DIAGNOSIS — E1140 Type 2 diabetes mellitus with diabetic neuropathy, unspecified: Secondary | ICD-10-CM | POA: Diagnosis not present

## 2018-12-21 DIAGNOSIS — M86171 Other acute osteomyelitis, right ankle and foot: Secondary | ICD-10-CM | POA: Diagnosis not present

## 2018-12-21 DIAGNOSIS — L89311 Pressure ulcer of right buttock, stage 1: Secondary | ICD-10-CM | POA: Diagnosis not present

## 2018-12-21 DIAGNOSIS — Z4781 Encounter for orthopedic aftercare following surgical amputation: Secondary | ICD-10-CM | POA: Diagnosis not present

## 2018-12-22 DIAGNOSIS — Z452 Encounter for adjustment and management of vascular access device: Secondary | ICD-10-CM | POA: Diagnosis not present

## 2018-12-22 DIAGNOSIS — E1169 Type 2 diabetes mellitus with other specified complication: Secondary | ICD-10-CM | POA: Diagnosis not present

## 2018-12-22 DIAGNOSIS — L89311 Pressure ulcer of right buttock, stage 1: Secondary | ICD-10-CM | POA: Diagnosis not present

## 2018-12-22 DIAGNOSIS — Z4781 Encounter for orthopedic aftercare following surgical amputation: Secondary | ICD-10-CM | POA: Diagnosis not present

## 2018-12-22 DIAGNOSIS — M86171 Other acute osteomyelitis, right ankle and foot: Secondary | ICD-10-CM | POA: Diagnosis not present

## 2018-12-22 DIAGNOSIS — E1140 Type 2 diabetes mellitus with diabetic neuropathy, unspecified: Secondary | ICD-10-CM | POA: Diagnosis not present

## 2018-12-24 DIAGNOSIS — M86171 Other acute osteomyelitis, right ankle and foot: Secondary | ICD-10-CM | POA: Diagnosis not present

## 2018-12-24 DIAGNOSIS — E1169 Type 2 diabetes mellitus with other specified complication: Secondary | ICD-10-CM | POA: Diagnosis not present

## 2018-12-24 DIAGNOSIS — E1140 Type 2 diabetes mellitus with diabetic neuropathy, unspecified: Secondary | ICD-10-CM | POA: Diagnosis not present

## 2018-12-24 DIAGNOSIS — Z4781 Encounter for orthopedic aftercare following surgical amputation: Secondary | ICD-10-CM | POA: Diagnosis not present

## 2018-12-24 DIAGNOSIS — Z452 Encounter for adjustment and management of vascular access device: Secondary | ICD-10-CM | POA: Diagnosis not present

## 2018-12-24 DIAGNOSIS — L89311 Pressure ulcer of right buttock, stage 1: Secondary | ICD-10-CM | POA: Diagnosis not present

## 2018-12-25 DIAGNOSIS — E1169 Type 2 diabetes mellitus with other specified complication: Secondary | ICD-10-CM | POA: Diagnosis not present

## 2018-12-25 DIAGNOSIS — Z452 Encounter for adjustment and management of vascular access device: Secondary | ICD-10-CM | POA: Diagnosis not present

## 2018-12-25 DIAGNOSIS — E1140 Type 2 diabetes mellitus with diabetic neuropathy, unspecified: Secondary | ICD-10-CM | POA: Diagnosis not present

## 2018-12-25 DIAGNOSIS — Z4781 Encounter for orthopedic aftercare following surgical amputation: Secondary | ICD-10-CM | POA: Diagnosis not present

## 2018-12-25 DIAGNOSIS — L89311 Pressure ulcer of right buttock, stage 1: Secondary | ICD-10-CM | POA: Diagnosis not present

## 2018-12-25 DIAGNOSIS — M86171 Other acute osteomyelitis, right ankle and foot: Secondary | ICD-10-CM | POA: Diagnosis not present

## 2018-12-28 ENCOUNTER — Inpatient Hospital Stay
Admission: EM | Admit: 2018-12-28 | Discharge: 2019-01-04 | DRG: 064 | Disposition: A | Discharge status: Skilled Nursing Facility | Payer: Medicare Other | Attending: Internal Medicine | Admitting: Internal Medicine

## 2018-12-28 ENCOUNTER — Encounter: Payer: Self-pay | Admitting: Emergency Medicine

## 2018-12-28 ENCOUNTER — Other Ambulatory Visit: Payer: Self-pay

## 2018-12-28 ENCOUNTER — Emergency Department: Payer: Medicare Other

## 2018-12-28 DIAGNOSIS — Z86711 Personal history of pulmonary embolism: Secondary | ICD-10-CM

## 2018-12-28 DIAGNOSIS — I6203 Nontraumatic chronic subdural hemorrhage: Principal | ICD-10-CM | POA: Diagnosis present

## 2018-12-28 DIAGNOSIS — Z85828 Personal history of other malignant neoplasm of skin: Secondary | ICD-10-CM | POA: Diagnosis not present

## 2018-12-28 DIAGNOSIS — R4781 Slurred speech: Secondary | ICD-10-CM | POA: Diagnosis not present

## 2018-12-28 DIAGNOSIS — E1122 Type 2 diabetes mellitus with diabetic chronic kidney disease: Secondary | ICD-10-CM | POA: Diagnosis present

## 2018-12-28 DIAGNOSIS — R011 Cardiac murmur, unspecified: Secondary | ICD-10-CM | POA: Diagnosis present

## 2018-12-28 DIAGNOSIS — R29810 Facial weakness: Secondary | ICD-10-CM | POA: Diagnosis not present

## 2018-12-28 DIAGNOSIS — E876 Hypokalemia: Secondary | ICD-10-CM | POA: Diagnosis present

## 2018-12-28 DIAGNOSIS — Z8546 Personal history of malignant neoplasm of prostate: Secondary | ICD-10-CM

## 2018-12-28 DIAGNOSIS — Z7901 Long term (current) use of anticoagulants: Secondary | ICD-10-CM

## 2018-12-28 DIAGNOSIS — E1140 Type 2 diabetes mellitus with diabetic neuropathy, unspecified: Secondary | ICD-10-CM | POA: Diagnosis not present

## 2018-12-28 DIAGNOSIS — R52 Pain, unspecified: Secondary | ICD-10-CM | POA: Diagnosis not present

## 2018-12-28 DIAGNOSIS — I10 Essential (primary) hypertension: Secondary | ICD-10-CM | POA: Diagnosis present

## 2018-12-28 DIAGNOSIS — I639 Cerebral infarction, unspecified: Secondary | ICD-10-CM | POA: Diagnosis not present

## 2018-12-28 DIAGNOSIS — S065X9A Traumatic subdural hemorrhage with loss of consciousness of unspecified duration, initial encounter: Secondary | ICD-10-CM

## 2018-12-28 DIAGNOSIS — I129 Hypertensive chronic kidney disease with stage 1 through stage 4 chronic kidney disease, or unspecified chronic kidney disease: Secondary | ICD-10-CM | POA: Diagnosis present

## 2018-12-28 DIAGNOSIS — I69398 Other sequelae of cerebral infarction: Secondary | ICD-10-CM | POA: Diagnosis not present

## 2018-12-28 DIAGNOSIS — S065XAA Traumatic subdural hemorrhage with loss of consciousness status unknown, initial encounter: Secondary | ICD-10-CM | POA: Diagnosis present

## 2018-12-28 DIAGNOSIS — E119 Type 2 diabetes mellitus without complications: Secondary | ICD-10-CM | POA: Diagnosis not present

## 2018-12-28 DIAGNOSIS — N183 Chronic kidney disease, stage 3 unspecified: Secondary | ICD-10-CM | POA: Diagnosis present

## 2018-12-28 DIAGNOSIS — Z89411 Acquired absence of right great toe: Secondary | ICD-10-CM

## 2018-12-28 DIAGNOSIS — C61 Malignant neoplasm of prostate: Secondary | ICD-10-CM | POA: Diagnosis not present

## 2018-12-28 DIAGNOSIS — M86171 Other acute osteomyelitis, right ankle and foot: Secondary | ICD-10-CM | POA: Diagnosis not present

## 2018-12-28 DIAGNOSIS — L89311 Pressure ulcer of right buttock, stage 1: Secondary | ICD-10-CM | POA: Diagnosis not present

## 2018-12-28 DIAGNOSIS — E1169 Type 2 diabetes mellitus with other specified complication: Secondary | ICD-10-CM | POA: Diagnosis not present

## 2018-12-28 DIAGNOSIS — M109 Gout, unspecified: Secondary | ICD-10-CM | POA: Diagnosis not present

## 2018-12-28 DIAGNOSIS — Z7401 Bed confinement status: Secondary | ICD-10-CM | POA: Diagnosis not present

## 2018-12-28 DIAGNOSIS — Z86010 Personal history of colonic polyps: Secondary | ICD-10-CM

## 2018-12-28 DIAGNOSIS — N312 Flaccid neuropathic bladder, not elsewhere classified: Secondary | ICD-10-CM | POA: Diagnosis not present

## 2018-12-28 DIAGNOSIS — I679 Cerebrovascular disease, unspecified: Secondary | ICD-10-CM | POA: Diagnosis not present

## 2018-12-28 DIAGNOSIS — M6281 Muscle weakness (generalized): Secondary | ICD-10-CM | POA: Diagnosis not present

## 2018-12-28 DIAGNOSIS — R51 Headache: Secondary | ICD-10-CM | POA: Diagnosis not present

## 2018-12-28 DIAGNOSIS — N179 Acute kidney failure, unspecified: Secondary | ICD-10-CM | POA: Diagnosis not present

## 2018-12-28 DIAGNOSIS — I63233 Cerebral infarction due to unspecified occlusion or stenosis of bilateral carotid arteries: Secondary | ICD-10-CM | POA: Diagnosis not present

## 2018-12-28 DIAGNOSIS — Z66 Do not resuscitate: Secondary | ICD-10-CM | POA: Diagnosis present

## 2018-12-28 DIAGNOSIS — I6200 Nontraumatic subdural hemorrhage, unspecified: Secondary | ICD-10-CM | POA: Diagnosis not present

## 2018-12-28 DIAGNOSIS — Z9849 Cataract extraction status, unspecified eye: Secondary | ICD-10-CM | POA: Diagnosis not present

## 2018-12-28 DIAGNOSIS — I69314 Frontal lobe and executive function deficit following cerebral infarction: Secondary | ICD-10-CM | POA: Diagnosis not present

## 2018-12-28 DIAGNOSIS — I480 Paroxysmal atrial fibrillation: Secondary | ICD-10-CM | POA: Diagnosis not present

## 2018-12-28 DIAGNOSIS — E559 Vitamin D deficiency, unspecified: Secondary | ICD-10-CM | POA: Diagnosis not present

## 2018-12-28 DIAGNOSIS — E785 Hyperlipidemia, unspecified: Secondary | ICD-10-CM | POA: Diagnosis not present

## 2018-12-28 DIAGNOSIS — Z9181 History of falling: Secondary | ICD-10-CM | POA: Diagnosis not present

## 2018-12-28 DIAGNOSIS — Z7984 Long term (current) use of oral hypoglycemic drugs: Secondary | ICD-10-CM | POA: Diagnosis not present

## 2018-12-28 DIAGNOSIS — N401 Enlarged prostate with lower urinary tract symptoms: Secondary | ICD-10-CM | POA: Diagnosis not present

## 2018-12-28 DIAGNOSIS — M2550 Pain in unspecified joint: Secondary | ICD-10-CM | POA: Diagnosis not present

## 2018-12-28 DIAGNOSIS — E860 Dehydration: Secondary | ICD-10-CM | POA: Diagnosis not present

## 2018-12-28 DIAGNOSIS — M19012 Primary osteoarthritis, left shoulder: Secondary | ICD-10-CM | POA: Diagnosis not present

## 2018-12-28 DIAGNOSIS — R338 Other retention of urine: Secondary | ICD-10-CM | POA: Diagnosis not present

## 2018-12-28 DIAGNOSIS — G4700 Insomnia, unspecified: Secondary | ICD-10-CM | POA: Diagnosis not present

## 2018-12-28 DIAGNOSIS — I69393 Ataxia following cerebral infarction: Secondary | ICD-10-CM | POA: Diagnosis not present

## 2018-12-28 DIAGNOSIS — M810 Age-related osteoporosis without current pathological fracture: Secondary | ICD-10-CM | POA: Diagnosis not present

## 2018-12-28 DIAGNOSIS — I69320 Aphasia following cerebral infarction: Secondary | ICD-10-CM | POA: Diagnosis not present

## 2018-12-28 DIAGNOSIS — Z7983 Long term (current) use of bisphosphonates: Secondary | ICD-10-CM | POA: Diagnosis not present

## 2018-12-28 DIAGNOSIS — N138 Other obstructive and reflux uropathy: Secondary | ICD-10-CM | POA: Diagnosis not present

## 2018-12-28 DIAGNOSIS — Z4781 Encounter for orthopedic aftercare following surgical amputation: Secondary | ICD-10-CM | POA: Diagnosis not present

## 2018-12-28 DIAGNOSIS — Z8739 Personal history of other diseases of the musculoskeletal system and connective tissue: Secondary | ICD-10-CM

## 2018-12-28 DIAGNOSIS — I69322 Dysarthria following cerebral infarction: Secondary | ICD-10-CM | POA: Diagnosis not present

## 2018-12-28 DIAGNOSIS — Z79899 Other long term (current) drug therapy: Secondary | ICD-10-CM

## 2018-12-28 DIAGNOSIS — R5381 Other malaise: Secondary | ICD-10-CM | POA: Diagnosis not present

## 2018-12-28 DIAGNOSIS — Z452 Encounter for adjustment and management of vascular access device: Secondary | ICD-10-CM | POA: Diagnosis not present

## 2018-12-28 HISTORY — DX: Unspecified atrial fibrillation: I48.91

## 2018-12-28 HISTORY — DX: Essential (primary) hypertension: I10

## 2018-12-28 LAB — CBC WITH DIFFERENTIAL/PLATELET
Abs Immature Granulocytes: 0.05 10*3/uL (ref 0.00–0.07)
Basophils Absolute: 0.1 10*3/uL (ref 0.0–0.1)
Basophils Relative: 1 %
Eosinophils Absolute: 0.6 10*3/uL — ABNORMAL HIGH (ref 0.0–0.5)
Eosinophils Relative: 5 %
HCT: 47.1 % (ref 39.0–52.0)
Hemoglobin: 15.4 g/dL (ref 13.0–17.0)
Immature Granulocytes: 0 %
Lymphocytes Relative: 29 %
Lymphs Abs: 3.6 10*3/uL (ref 0.7–4.0)
MCH: 30.7 pg (ref 26.0–34.0)
MCHC: 32.7 g/dL (ref 30.0–36.0)
MCV: 93.8 fL (ref 80.0–100.0)
Monocytes Absolute: 0.9 10*3/uL (ref 0.1–1.0)
Monocytes Relative: 8 %
Neutro Abs: 7.2 10*3/uL (ref 1.7–7.7)
Neutrophils Relative %: 57 %
Platelets: 200 10*3/uL (ref 150–400)
RBC: 5.02 MIL/uL (ref 4.22–5.81)
RDW: 13.8 % (ref 11.5–15.5)
Smear Review: NORMAL
WBC: 12.4 10*3/uL — ABNORMAL HIGH (ref 4.0–10.5)
nRBC: 0 % (ref 0.0–0.2)

## 2018-12-28 LAB — COMPREHENSIVE METABOLIC PANEL
ALT: 14 U/L (ref 0–44)
AST: 23 U/L (ref 15–41)
Albumin: 3.3 g/dL — ABNORMAL LOW (ref 3.5–5.0)
Alkaline Phosphatase: 121 U/L (ref 38–126)
Anion gap: 10 (ref 5–15)
BUN: 37 mg/dL — ABNORMAL HIGH (ref 8–23)
CO2: 22 mmol/L (ref 22–32)
Calcium: 9 mg/dL (ref 8.9–10.3)
Chloride: 103 mmol/L (ref 98–111)
Creatinine, Ser: 1.67 mg/dL — ABNORMAL HIGH (ref 0.61–1.24)
GFR calc Af Amer: 43 mL/min — ABNORMAL LOW (ref >60)
GFR calc non Af Amer: 37 mL/min — ABNORMAL LOW (ref >60)
Glucose, Bld: 184 mg/dL — ABNORMAL HIGH (ref 70–99)
Potassium: 4.7 mmol/L (ref 3.5–5.1)
Sodium: 135 mmol/L (ref 135–145)
Total Bilirubin: 0.7 mg/dL (ref 0.3–1.2)
Total Protein: 7.6 g/dL (ref 6.5–8.1)

## 2018-12-28 LAB — PROTIME-INR
INR: 1.6
Prothrombin Time: 18.9 seconds — ABNORMAL HIGH (ref 11.4–15.2)

## 2018-12-28 MED ORDER — LISINOPRIL 5 MG PO TABS: 5.0000 mg | ORAL_TABLET | Freq: Every day | ORAL | Status: DC

## 2018-12-28 MED ORDER — ACETAMINOPHEN 325 MG PO TABS
ORAL_TABLET | ORAL | Status: AC
  Administered 2018-12-28: 650 mg via ORAL
  Filled 2018-12-28: qty 2

## 2018-12-28 MED ORDER — ACETAMINOPHEN 650 MG RE SUPP: 650.0000 mg | Freq: Four times a day (QID) | RECTAL | Status: DC | PRN

## 2018-12-28 MED ORDER — ACETAMINOPHEN 325 MG PO TABS
650.0000 mg | ORAL_TABLET | Freq: Four times a day (QID) | ORAL | Status: DC | PRN
  Administered 2018-12-28: 650 mg via ORAL
  Filled 2018-12-28: qty 2

## 2018-12-28 MED ORDER — LABETALOL HCL 5 MG/ML IV SOLN
10.0000 mg | INTRAVENOUS | Status: DC | PRN
  Administered 2018-12-28: 10 mg via INTRAVENOUS
  Filled 2018-12-28: qty 4

## 2018-12-28 MED ORDER — VITAMIN K1 10 MG/ML IJ SOLN
10.0000 mg | Freq: Once | INTRAVENOUS | Status: AC
  Administered 2018-12-28: 10 mg via INTRAVENOUS
  Filled 2018-12-28: qty 1

## 2018-12-28 MED ORDER — LISINOPRIL 5 MG PO TABS
5.0000 mg | ORAL_TABLET | Freq: Every day | ORAL | Status: DC
  Administered 2018-12-29 – 2019-01-04 (×7): 5 mg via ORAL
  Filled 2018-12-28 (×7): qty 1

## 2018-12-28 NOTE — Consult Note (Signed)
Name: Richard [name] MRN: [id number] DOB: March 14, 1931    ADMISSION DATE:  12/28/2018 CONSULTATION DATE: 12/28/2018  REFERRING MD : Dr. Jannifer Franklin   CHIEF COMPLAINT: Headache   BRIEF PATIENT DESCRIPTION:  85 yo male with hx of atrial fibrillation on coumadin admitted to ICU with 9 mm subdural hematoma for closer monitoring   SIGNIFICANT EVENTS/STUDIES:  01/13-CT Head revealed right superior frontal and parietal subdural hematomas. Maximum  thickness of subdural hematoma is 9 mm. There is mild localized mass effect on the underlying brain parenchyma, but there is no midline shift. The subdural hematoma appears acute in the parietal region in subacute in the right frontal region. No other extra-axial fluid collections. No intra- axial mass or hemorrhage. There is mild underlying atrophy with small vessel disease in the centra semiovale bilaterally. No acute infarct evident. There are foci of arterial vascular calcification. There is mucosal thickening in several ethmoid air cells. Neurosurgeon Dr. Lacinda Axon contacted by ER physician recommended IV vitamin K (pt takes coumadin in outpatient setting for atrial fibrillation) and pending INR results may need K centra. 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  He presented to [facility] ER on 01/13 complaint of dull headache 5/10 pain onset of symptoms 1400 on 12/28/2018.  In the ER he denied recent fall, however he did state he fell 1 month ago.  CT Head revealed right superior frontal and parietal subdural hematomas, maximum thickness 9 mm, mild localized  mass-effect on the underlying brain parenchyma but no midline shift.  Neurosurgeon Dr. Lacinda Axon contacted by ER physician he recommended ICU admission for frequent neuro checks, IV vitamin K (pt takes coumadin in outpatient setting, possible K centra pending INR results, and pt does not require transfer to another facility for neurosurgery at this time.  Lab results revealed glucose 184, BUN 37, creatinine 1.67, wbc 12.4, PT 18.9, and INR 1.60.  He was subsequently admitted to ICU by hospitalist team for additional workup and treatment.   PAST MEDICAL HISTORY :   has a past medical history of Atrial fibrillation ([place]), Cancer ([place]), Chronic kidney disease, Colon polyp, Diabetes mellitus, History of blood clotting disorder, and HTN (hypertension).  has a past surgical history that includes Hernia repair; Eye surgery; Skin cancer excision; Amputation toe (Right, 11/29/2018); and Lower Extremity Angiography (Right, 12/02/2018). Prior to Admission medications   Medication Sig Start Date End Date Taking? Authorizing Provider  Alpha-Lipoic Acid 200 MG CAPS Take by mouth.    [provider]  ALPRAZolam Duanne Moron) 0.25 MG tablet Take 0.25 mg by mouth at bedtime as needed.      [provider]  amoxicillin-clavulanate (AUGMENTIN) 875-125 MG tablet Take 1 tablet by mouth 2 (two) times daily. 12/15/18   Tsosie Billing, MD  atorvastatin (LIPITOR) 10 MG tablet Take 10 mg by mouth daily.      [provider]  Calcium Carbonate (CALCIUM 600 PO) Take by mouth.      [provider]  Cholecalciferol (VITAMIN D) 2000 UNITS tablet Take 2,000 Units by mouth daily.      [provider]  CIALIS 5 MG tablet TK 1/2 T  PO QD 06/16/17   [provider]  Coenzyme Q10 10 MG capsule Take 10 mg by mouth.    [provider]  colchicine 0.6 MG tablet Take 0.6 mg by mouth.    [provider]  Febuxostat (ULORIC) 80 MG TABS Take 1 tablet by mouth daily.      [provider]  glimepiride (AMARYL) 4 MG tablet Take 4 mg by mouth daily with breakfast.  06/15/17   [provider]  ibandronate (BONIVA) 150 MG tablet Take 150 mg by mouth every 30 (thirty) days. Take in the morning with a full glass of water, on an empty stomach, and do not take anything else by mouth or lie down for the next 30 min.     [provider]  Ibuprofen (ADVIL PO) Take by mouth.      [provider]  lisinopril (PRINIVIL,ZESTRIL) 5 MG tablet Take 5 mg by mouth daily.      [provider]  Multiple Vitamins-Minerals (MULTIVITAMIN ADULT EXTRA C PO) Take by mouth.    [provider]  Multiple Vitamins-Minerals (MULTIVITAMIN ADULT PO) Take by mouth.    [provider]  nystatin-triamcinolone (MYCOLOG II) cream Apply to affected areas bid 05/08/16   [provider]  Red Yeast Rice 600 MG TABS Take 1 tablet by mouth daily.     [provider]  tadalafil (CIALIS) 5 MG tablet Take 5 mg by mouth every other day.  05/21/17   [provider]  Vitamins A & D (VITAMIN A & D) [address] UNITS TABS Take by mouth.      [provider]  warfarin (COUMADIN) 5 MG tablet Take 5 mg by mouth daily. Except MONDAYS and FRIDAYS.    [provider]  warfarin (COUMADIN) 7.5 MG tablet Take 7.5 mg by mouth. On MONDAYS and [place]    [provider]   No Known Allergies  FAMILY HISTORY:  family history is not on file. SOCIAL HISTORY:  reports that he has never smoked. He has never used smokeless tobacco. He reports that he does not drink alcohol or use drugs.  REVIEW OF SYSTEMS: Positives in BOLD  Constitutional: Negative for fever, chills, weight loss, malaise/fatigue and diaphoresis.  HENT: Negative for hearing loss, ear pain, nosebleeds, congestion, sore throat, neck pain, tinnitus and ear discharge.   Eyes: Negative for blurred vision, double vision, photophobia, pain, discharge and redness.    Respiratory: Negative for cough, hemoptysis, sputum production, shortness of breath, wheezing and stridor.   Cardiovascular: Negative for chest pain, palpitations, orthopnea, claudication, leg swelling and PND.  Gastrointestinal: Negative for heartburn, nausea, vomiting, abdominal pain, diarrhea, constipation, blood in stool and melena.  Genitourinary: Negative for dysuria, urgency, frequency, hematuria and flank pain.  Musculoskeletal: Negative for myalgias, back pain, joint pain and falls.  Skin: Negative for itching and rash.  Neurological: dizziness, tingling, tremors, sensory change, speech change, focal weakness, seizures, loss of consciousness, weakness and headaches.  Endo/Heme/Allergies: Negative for environmental allergies and polydipsia. Does not bruise/bleed easily.  SUBJECTIVE:  c/o headache   VITAL SIGNS: Temp:  [97.7 F (36.5 C)] 97.7 F (36.5 C) (01/13 1827) Pulse Rate:  [73-88] 73 (01/13 2030) Resp:  [20-27] 22 (01/13 2100) BP: (129-157)/(76-97) 157/97 (01/13 2100) SpO2:  [97 %-98 %] 98 % (01/13 2030) Weight:  [104.3 kg] 104.3 kg (01/13 1829)  PHYSICAL EXAMINATION: General: well developed, well nourished elderly male, NAD  Neuro: alert and oriented, follows commands, PERRL, bilateral upper and lower muscle  strength 5/5 HEENT: supple, no JVD  Cardiovascular: irregular irregular, no R/G Lungs: clear throughout, even, non labored Abdomen: +BS x4, soft, non tender, non distended  Musculoskeletal: normal , no edema, right great toe amputation   Skin: right great toe amputation wound vac in place no drainage or bleeding present   Recent Labs  Lab 12/28/18 1833  NA 135  K 4.7  CL 103  CO2 22  BUN 37*  CREATININE 1.67*  GLUCOSE 184*   Recent Labs  Lab 12/28/18 1833  HGB 15.4  HCT 47.1  WBC 12.4*  PLT 200   Ct Head Wo Contrast  Result Date: 12/28/2018 CLINICAL DATA:  Acute onset severe headache EXAM: CT HEAD WITHOUT CONTRAST TECHNIQUE: Contiguous axial  images were obtained from the base of the skull through the vertex without intravenous contrast. COMPARISON:  None. FINDINGS: Brain: There is mild diffuse atrophy. There is a subdural hematoma on the right with mixed attenuation fluid. Acute appearing hematoma is noted in the right parietal lobe region. The maximum thickness of this acute appearing subdural hematoma is 8 mm. More anteriorly in the right frontal lobe region, there is a 9 mm in thickness subdural hematoma with fluid of attenuation near that of surrounding brain parenchyma suggesting more subacute component. No other extra-axial fluid collections are identified. There is no mass or intra-axial hemorrhage. There is no midline shift. There is mild periventricular small vessel disease. No acute infarct is demonstrable. Vascular: No hyperdense vessel. There is calcification in each carotid siphon region. Skull: The bony calvarium appears intact. Sinuses/Orbits: There is mucosal thickening in several ethmoid air cells. Other visualized paranasal sinuses are clear. Orbits appear symmetric bilaterally. Patient has had cataract removals bilaterally. Other: Visualized mastoid air cells are clear. IMPRESSION: Right superior frontal and parietal subdural hematomas. Maximum thickness of subdural hematoma is 9 mm. There is mild localized mass effect on the underlying brain parenchyma, but there is no midline shift. The subdural hematoma appears acute in the parietal region in subacute in the right frontal region. No other extra-axial fluid collections. No intra-axial mass or hemorrhage. There is mild underlying atrophy with small vessel disease in the centra semiovale bilaterally. No acute infarct evident. There are foci of arterial vascular calcification. There is mucosal thickening in several ethmoid air cells. Critical Value/emergent results were called by telephone at the time of interpretation on 12/28/2018 at 7:13 pm to Dr. Larae Grooms , who verbally  acknowledged these results. Electronically Signed   By: Lowella Grip III M.D.   On: 12/28/2018 19:13    ASSESSMENT / PLAN:  Subdural Hematoma (9 mm) Hx: HTN, Blood Clotting Disorder, and Atrial Fibrillation (on coumadin)  Neuro checks q1hr Repeat CT Head in the am  Neurosurgery consulted appreciate input Trend CBC and coags  Monitor for s/sx of bleeding and transfuse for hgb <8 VTE px: SCD's, hold outpatient coumadin and avoid chemical prophylaxis  Maintain bp <140/90 Continue outpatient antihypertensives and prn labetalol for bp management  Continuous telemetry monitoring  Acute on chronic renal failure  Hypokalemia  Trend BMP  Replace electrolytes as indicated  Monitor UOP Avoid nephrotoxic medications   Gangrenous osteomyelitis/necrosis of the right foot/great toe s/p right great toe amputation 11/29/18 with wound vac in place  Trend WBC and monitor fever curve Continue outpatient augmentin   Type II Diabetes Mellitus  CBG's q4hrs  SSI   Marda Stalker, [place] Pager [phone] (please enter 7 digits) PCCM Consult Pager [phone] (please enter 7 digits)

## 2018-12-28 NOTE — ED Notes (Signed)
Pt denies fall or head trauma. No external bruising or skin tears.

## 2018-12-28 NOTE — ED Notes (Signed)
ED TO INPATIENT HANDOFF REPORT  Name/Age/Gender Richard [name] 85 y.o. male  Code Status Code Status History    Date Active Date Inactive Code Status Order ID Comments User Context   11/29/2018 1201 12/04/2018 2149 DNR [id number]  Dustin Flock, MD Inpatient   11/28/2018 1800 11/29/2018 1201 Full Code [id number]  Henreitta Leber, MD Inpatient    Questions for Most Recent Historical Code Status (Order [id number])    Question Answer Comment   In the event of cardiac or respiratory ARREST Do not call a "code blue"    In the event of cardiac or respiratory ARREST Do not perform Intubation, CPR, defibrillation or ACLS    In the event of cardiac or respiratory ARREST Use medication by any route, position, wound care, and other measures to relive pain and suffering. May use oxygen, suction and manual treatment of airway obstruction as needed for comfort.         Advance Directive Documentation     Most Recent Value  Type of Advance Directive  Healthcare Power of Attorney  Pre-existing out of facility DNR order (yellow form or pink MOST form)  -  "MOST" Form in Place?  -      Home/SNF/Other Home  Chief Complaint EMS Headache  Level of Care/Admitting Diagnosis ED Disposition    ED Disposition Condition [place]: [facility] [[id number]]  Level of Care: ICU [6]  Diagnosis: Subdural hematoma [facility]) [[id number]]  Admitting Physician: Lance Coon [[id number]]  Attending Physician: Lance Coon [phone]  Estimated length of stay: past midnight tomorrow  Certification:: I certify this patient will need inpatient services for at least 2 midnights  PT Class (Do Not Modify): Inpatient [101]  PT Acc Code (Do Not Modify): Private [1]       Medical History Past Medical History:  Diagnosis Date  . Atrial fibrillation ([place])   . Cancer ([place])   . Chronic kidney disease   . Colon polyp   . Diabetes mellitus   . History of blood clotting disorder    . HTN (hypertension)     Allergies No Known Allergies  IV Location/Drains/Wounds Patient Lines/Drains/Airways Status   Active Line/Drains/Airways    Name:   Placement date:   Placement time:   Site:   Days:   Peripheral IV 12/28/18 Right Wrist   12/28/18    2017    Wrist   less than 1   Peripheral IV 12/28/18 Right Antecubital   12/28/18    2300    Antecubital   less than 1   PICC Single Lumen 11/30/18 PICC Right Brachial 45 cm 0 cm   11/30/18    1655    Brachial   28   Incision (Closed) 11/29/18 Foot Right   11/29/18    0908     29   Pressure Injury 11/28/18 Stage II -  Partial thickness loss of dermis presenting as a shallow open ulcer with a red, pink wound bed without slough. this is a partial thickness lesion of unknown etiology, NOT a pressure injury   11/28/18    1858     30          Labs/Imaging Results for orders placed or performed during the hospital encounter of 12/28/18 (from the past 48 hour(s))  CBC with Differential     Status: Abnormal   Collection Time: 12/28/18  6:33 PM  Result Value Ref Range   WBC 12.4 (H) 4.0 - 10.5 K/uL  RBC 5.02 4.22 - 5.81 MIL/uL   Hemoglobin 15.4 13.0 - 17.0 g/dL   HCT 47.1 39.0 - 52.0 %   MCV 93.8 80.0 - 100.0 fL   MCH 30.7 26.0 - 34.0 pg   MCHC 32.7 30.0 - 36.0 g/dL   RDW 13.8 11.5 - 15.5 %   Platelets 200 150 - 400 K/uL   nRBC 0.0 0.0 - 0.2 %   Neutrophils Relative % 57 %   Neutro Abs 7.2 1.7 - 7.7 K/uL   Lymphocytes Relative 29 %   Lymphs Abs 3.6 0.7 - 4.0 K/uL   Monocytes Relative 8 %   Monocytes Absolute 0.9 0.1 - 1.0 K/uL   Eosinophils Relative 5 %   Eosinophils Absolute 0.6 (H) 0.0 - 0.5 K/uL   Basophils Relative 1 %   Basophils Absolute 0.1 0.0 - 0.1 K/uL   WBC Morphology MORPHOLOGY UNREMARKABLE    RBC Morphology MORPHOLOGY UNREMARKABLE    Smear Review Normal platelet morphology    Immature Granulocytes 0 %   Abs Immature Granulocytes 0.05 0.00 - 0.07 K/uL    Comment: Performed at [facility], [address] [place], [place] [address]  Comprehensive metabolic panel     Status: Abnormal   Collection Time: 12/28/18  6:33 PM  Result Value Ref Range   Sodium 135 135 - 145 mmol/L   Potassium 4.7 3.5 - 5.1 mmol/L   Chloride 103 98 - 111 mmol/L   CO2 22 22 - 32 mmol/L   Glucose, Bld 184 (H) 70 - 99 mg/dL   BUN 37 (H) 8 - 23 mg/dL   Creatinine, Ser 1.67 (H) 0.61 - 1.24 mg/dL   Calcium 9.0 8.9 - 10.3 mg/dL   Total Protein 7.6 6.5 - 8.1 g/dL   Albumin 3.3 (L) 3.5 - 5.0 g/dL   AST 23 15 - 41 U/L   ALT 14 0 - 44 U/L   Alkaline Phosphatase 121 38 - 126 U/L   Total Bilirubin 0.7 0.3 - 1.2 mg/dL   GFR calc non Af Amer 37 (L) >60 mL/min   GFR calc Af Amer 43 (L) >60 mL/min   Anion gap 10 5 - 15    Comment: Performed at [facility], [address], [place], [place] [address]  Protime-INR     Status: Abnormal   Collection Time: 12/28/18  7:42 PM  Result Value Ref Range   Prothrombin Time 18.9 (H) 11.4 - 15.2 seconds   INR 1.60     Comment: Performed at [facility], [place], [place] [address]   Ct Head Wo Contrast  Result Date: 12/28/2018 CLINICAL DATA:  Acute onset severe headache EXAM: CT HEAD WITHOUT CONTRAST TECHNIQUE: Contiguous axial images were obtained from the base of the skull through the vertex without intravenous contrast. COMPARISON:  None. FINDINGS: Brain: There is mild diffuse atrophy. There is a subdural hematoma on the right with mixed attenuation fluid. Acute appearing hematoma is noted in the right parietal lobe region. The maximum thickness of this acute appearing subdural hematoma is 8 mm. More anteriorly in the right frontal lobe region, there is a 9 mm in thickness subdural hematoma with fluid of attenuation near that of surrounding brain parenchyma suggesting more subacute component. No other extra-axial fluid collections are identified. There is no mass or intra-axial hemorrhage. There is no midline shift. There is mild  periventricular small vessel disease. No acute infarct is demonstrable. Vascular: No hyperdense vessel. There is calcification in each  carotid siphon region. Skull: The bony calvarium appears intact. Sinuses/Orbits: There is mucosal thickening in several ethmoid air cells. Other visualized paranasal sinuses are clear. Orbits appear symmetric bilaterally. Patient has had cataract removals bilaterally. Other: Visualized mastoid air cells are clear. IMPRESSION: Right superior frontal and parietal subdural hematomas. Maximum thickness of subdural hematoma is 9 mm. There is mild localized mass effect on the underlying brain parenchyma, but there is no midline shift. The subdural hematoma appears acute in the parietal region in subacute in the right frontal region. No other extra-axial fluid collections. No intra-axial mass or hemorrhage. There is mild underlying atrophy with small vessel disease in the centra semiovale bilaterally. No acute infarct evident. There are foci of arterial vascular calcification. There is mucosal thickening in several ethmoid air cells. Critical Value/emergent results were called by telephone at the time of interpretation on 12/28/2018 at 7:13 pm to Dr. Larae Grooms , who verbally acknowledged these results. Electronically Signed   By: Lowella Grip III M.D.   On: 12/28/2018 19:13    Pending Labs [employer] (From admission, onward)    Start     Ordered   Signed and Occupational hygienist morning,   R     Signed and Held   Signed and Held  CBC  Tomorrow morning,   R     Signed and Held          Vitals/Pain Today's Vitals   12/28/18 2030 12/28/18 2100 12/28/18 2130 12/28/18 2245  BP: (!) 141/79 (!) 157/97 (!) 148/92 (!) 169/92  Pulse: 73   85  Resp: (!) 24 (!) 22 (!) 21 (!) 25  Temp:      TempSrc:      SpO2: 98%   97%  Weight:      Height:      PainSc:        Isolation Precautions No active isolations  Medications Medications   lisinopril (PRINIVIL,ZESTRIL) tablet 5 mg (has no administration in time range)  labetalol (NORMODYNE,TRANDATE) injection 10 mg (has no administration in time range)  phytonadione (VITAMIN K) 10 mg in dextrose 5 % 50 mL IVPB ( Intravenous Stopped 12/28/18 2105)    Mobility walks

## 2018-12-28 NOTE — ED Notes (Signed)
Admitting MD at bedside.

## 2018-12-28 NOTE — ED Triage Notes (Signed)
Pt in via EMS from home with c/o HTN, high blood sugar. EMS reports BP 160/90, FSBS 180. Pt c/o HA today, pain 4/10. Pt MD would like him to have CT completed. Pt with wound drain to right foot. Pt with recent toe amputation.

## 2018-12-28 NOTE — ED Triage Notes (Signed)
States began headache at 2pm today. States does not normally have headaches. Denies fall or injury. States has wound vac R foot due to amputation 2 weeks.

## 2018-12-28 NOTE — ED Notes (Signed)
Pts son has left the bedside but requesting updates when possible. Lights dimmed and pt attempting to sleep at this time.

## 2018-12-28 NOTE — ED Notes (Signed)
Report given to ICU RN

## 2018-12-28 NOTE — H&P (Signed)
Richard [name] at [place] NAME: Richard [name]    MR#:  [id number]  DATE OF BIRTH:  10/13/1931  DATE OF ADMISSION:  12/28/2018  PRIMARY CARE PHYSICIAN: [name], MD   REQUESTING/REFERRING PHYSICIAN: Clearnce Hasten, MD  CHIEF COMPLAINT:   Chief Complaint  Patient presents with  . Headache    HISTORY OF PRESENT ILLNESS:  Richard [name]  is a 85 y.o. male who presents with chief complaint as above.  Patient presents to the ED for evaluation after onset of headache today.  Patient states he "never gets headaches".  Here in the ED he was found to have a subdural hematoma without any significant mass-effect.  Patient denies any recent falls or trauma.  He states that he did fall several weeks ago, but not since that time.  He is on warfarin due to A. fib.  He has no neuro deficits.  He was given vitamin K in the ED, and ED physician spoke with neurosurgeon who recommends patient be admitted here to the ICU with every hour neuro checks and repeat head CT in the morning.  Hospitalist were called for admission  PAST MEDICAL HISTORY:   Past Medical History:  Diagnosis Date  . Atrial fibrillation ([place])   . Cancer ([place])   . Chronic kidney disease   . Colon polyp   . Diabetes mellitus   . History of blood clotting disorder   . HTN (hypertension)      PAST SURGICAL HISTORY:   Past Surgical History:  Procedure Laterality Date  . AMPUTATION TOE Right 11/29/2018   Procedure: AMPUTATION TOE;  Surgeon: Albertine Patricia, DPM;  Location: [facility] ORS;  Service: Podiatry;  Laterality: Right;  . EYE SURGERY     CATARACTS  . HERNIA REPAIR    . LOWER EXTREMITY ANGIOGRAPHY Right 12/02/2018   Procedure: Lower Extremity Angiography;  Surgeon: Algernon Huxley, MD;  Location: [facility];  Service: Cardiovascular;  Laterality: Right;  . SKIN CANCER EXCISION       SOCIAL HISTORY:   Social History   Tobacco Use  . Smoking status: Never Smoker  .  Smokeless tobacco: Never Used  Substance Use Topics  . Alcohol use: No     FAMILY HISTORY:    Family history reviewed and is non-contributory DRUG ALLERGIES:  No Known Allergies  MEDICATIONS AT HOME:   Prior to Admission medications   Medication Sig Start Date End Date Taking? Authorizing Provider  Alpha-Lipoic Acid 200 MG CAPS Take by mouth.    [provider]  ALPRAZolam Duanne Moron) 0.25 MG tablet Take 0.25 mg by mouth at bedtime as needed.      [provider]  amoxicillin-clavulanate (AUGMENTIN) 875-125 MG tablet Take 1 tablet by mouth 2 (two) times daily. 12/15/18   Tsosie Billing, MD  atorvastatin (LIPITOR) 10 MG tablet Take 10 mg by mouth daily.      [provider]  Calcium Carbonate (CALCIUM 600 PO) Take by mouth.      [provider]  Cholecalciferol (VITAMIN D) 2000 UNITS tablet Take 2,000 Units by mouth daily.      [provider]  CIALIS 5 MG tablet TK 1/2 T PO QD 06/16/17   [provider]  Coenzyme Q10 10 MG capsule Take 10 mg by mouth.    [provider]  colchicine 0.6 MG tablet Take 0.6 mg by mouth.    [provider]  Febuxostat (ULORIC) 80 MG TABS Take 1 tablet  by mouth daily.     [provider]  glimepiride (AMARYL) 4 MG tablet Take 4 mg by mouth daily with breakfast.  06/15/17   [provider]  ibandronate (BONIVA) 150 MG tablet Take 150 mg by mouth every 30 (thirty) days. Take in the morning with a full glass of water, on an empty stomach, and do not take anything else by mouth or lie down for the next 30 min.     [provider]  Ibuprofen (ADVIL PO) Take by mouth.      [provider]  lisinopril (PRINIVIL,ZESTRIL) 5 MG tablet Take 5 mg by mouth daily.      [provider]  Multiple Vitamins-Minerals (MULTIVITAMIN ADULT EXTRA C PO) Take by mouth.    [provider]  Multiple Vitamins-Minerals (MULTIVITAMIN ADULT PO) Take by mouth.     [provider]  nystatin-triamcinolone (MYCOLOG II) cream Apply to affected areas bid 05/08/16   [provider]  Red Yeast Rice 600 MG TABS Take 1 tablet by mouth daily.     [provider]  tadalafil (CIALIS) 5 MG tablet Take 5 mg by mouth every other day.  05/21/17   [provider]  Vitamins A & D (VITAMIN A & D) [address] UNITS TABS Take by mouth.      [provider]  warfarin (COUMADIN) 5 MG tablet Take 5 mg by mouth daily. Except MONDAYS and FRIDAYS.    [provider]  warfarin (COUMADIN) 7.5 MG tablet Take 7.5 mg by mouth. On MONDAYS and FRIDAYS    [provider]    REVIEW OF SYSTEMS:  Review of Systems  Constitutional: Negative for chills, fever, malaise/fatigue and weight loss.  HENT: Negative for ear pain, hearing loss and tinnitus.   Eyes: Negative for blurred vision, double vision, pain and redness.  Respiratory: Negative for cough, hemoptysis and shortness of breath.   Cardiovascular: Negative for chest pain, palpitations, orthopnea and leg swelling.  Gastrointestinal: Negative for abdominal pain, constipation, diarrhea, nausea and vomiting.  Genitourinary: Negative for dysuria, frequency and hematuria.  Musculoskeletal: Negative for back pain, joint pain and neck pain.  Skin:       No acne, rash, or lesions  Neurological: Positive for headaches. Negative for dizziness, tremors, focal weakness and weakness.  Endo/Heme/Allergies: Negative for polydipsia. Does not bruise/bleed easily.  Psychiatric/Behavioral: Negative for depression. The patient is not nervous/anxious and does not have insomnia.      VITAL SIGNS:   Vitals:   12/28/18 1930 12/28/18 2000 12/28/18 2030 12/28/18 2100  BP: (!) 143/86 (!) 145/91 (!) 141/79 (!) 157/97  Pulse: 82 83 73   Resp: (!) 25 (!) 27 (!) 24 (!) 22  Temp:      TempSrc:      SpO2: 97% 98% 98%   Weight:      Height:       Wt Readings from Last 3 Encounters:  12/28/18  104.3 kg  12/02/18 95.3 kg  05/11/18 101.2 kg    PHYSICAL EXAMINATION:  Physical Exam  Vitals reviewed. Constitutional: He is oriented to person, place, and time. He appears well-developed and well-nourished. No distress.  HENT:  Head: Normocephalic and atraumatic.  Mouth/Throat: Oropharynx is clear and moist.  Eyes: Pupils are equal, round, and reactive to light. Conjunctivae and EOM are normal. No scleral icterus.  Neck: Normal range of motion. Neck supple. No JVD present. No thyromegaly present.  Cardiovascular: Normal rate, regular rhythm and intact distal pulses. Exam reveals  no gallop and no friction rub.  No murmur heard. Respiratory: Effort normal and breath sounds normal. No respiratory distress. He has no wheezes. He has no rales.  GI: Soft. Bowel sounds are normal. He exhibits no distension. There is no abdominal tenderness.  Musculoskeletal: Normal range of motion.        General: No edema.     Comments: No arthritis, no gout  Lymphadenopathy:    He has no cervical adenopathy.  Neurological: He is alert and oriented to person, place, and time. No cranial nerve deficit.  Neurologic: Cranial nerves II-XII intact, Sensation intact to light touch/pinprick, 5/5 strength in all extremities, no dysarthria, no aphasia, no dysphagia, memory intact, showed no abnormality, no pronator drift  Skin: Skin is warm and dry. No rash noted. No erythema.  Psychiatric: He has a normal mood and affect. His behavior is normal. Judgment and thought content normal.    LABORATORY PANEL:   CBC Recent Labs  Lab 12/28/18 1833  WBC 12.4*  HGB 15.4  HCT 47.1  PLT 200   ------------------------------------------------------------------------------------------------------------------  Chemistries  Recent Labs  Lab 12/28/18 1833  NA 135  K 4.7  CL 103  CO2 22  GLUCOSE 184*  BUN 37*  CREATININE 1.67*  CALCIUM 9.0  AST 23  ALT 14  ALKPHOS 121  BILITOT 0.7    ------------------------------------------------------------------------------------------------------------------  Cardiac Enzymes No results for input(s): TROPONINI in the last 168 hours. ------------------------------------------------------------------------------------------------------------------  RADIOLOGY:  Ct Head Wo Contrast  Result Date: 12/28/2018 CLINICAL DATA:  Acute onset severe headache EXAM: CT HEAD WITHOUT CONTRAST TECHNIQUE: Contiguous axial images were obtained from the base of the skull through the vertex without intravenous contrast. COMPARISON:  None. FINDINGS: Brain: There is mild diffuse atrophy. There is a subdural hematoma on the right with mixed attenuation fluid. Acute appearing hematoma is noted in the right parietal lobe region. The maximum thickness of this acute appearing subdural hematoma is 8 mm. More anteriorly in the right frontal lobe region, there is a 9 mm in thickness subdural hematoma with fluid of attenuation near that of surrounding brain parenchyma suggesting more subacute component. No other extra-axial fluid collections are identified. There is no mass or intra-axial hemorrhage. There is no midline shift. There is mild periventricular small vessel disease. No acute infarct is demonstrable. Vascular: No hyperdense vessel. There is calcification in each carotid siphon region. Skull: The bony calvarium appears intact. Sinuses/Orbits: There is mucosal thickening in several ethmoid air cells. Other visualized paranasal sinuses are clear. Orbits appear symmetric bilaterally. Patient has had cataract removals bilaterally. Other: Visualized mastoid air cells are clear. IMPRESSION: Right superior frontal and parietal subdural hematomas. Maximum thickness of subdural hematoma is 9 mm. There is mild localized mass effect on the underlying brain parenchyma, but there is no midline shift. The subdural hematoma appears acute in the parietal region in subacute in the  right frontal region. No other extra-axial fluid collections. No intra-axial mass or hemorrhage. There is mild underlying atrophy with small vessel disease in the centra semiovale bilaterally. No acute infarct evident. There are foci of arterial vascular calcification. There is mucosal thickening in several ethmoid air cells. Critical Value/emergent results were called by telephone at the time of interpretation on 12/28/2018 at 7:13 pm to Dr. Larae Grooms , who verbally acknowledged these results. Electronically Signed   By: Lowella Grip III M.D.   On: 12/28/2018 19:13    EKG:   Orders placed or performed during the hospital encounter of 11/28/18  .  ED EKG 12-Lead  . ED EKG 12-Lead  . EKG 12-Lead  . EKG 12-Lead  . EKG    IMPRESSION AND PLAN:  Principal Problem:   Subdural hematoma ([place]) -likely spontaneous, neurosurgery consult, admit to ICU per neurosurgery recommendation, every hour neuro check, currently patient has no neuro deficits, repeat head CT in the morning Active Problems:   HTN (hypertension) -home dose antihypertensives and additional PRN antihypertensives with blood pressure goal less than 140/90   PAF (paroxysmal atrial fibrillation) ([place]) -continue home meds except for anticoagulation, vitamin K reversal given   Diabetes ([place]) -biting scale insulin coverage   CKD (chronic kidney disease), stage III ([place]) -at baseline, avoid nephrotoxins and monitor  Chart review performed and case discussed with ED provider. Labs, imaging and/or ECG reviewed by provider and discussed with patient/family. Management plans discussed with the patient and/or family.  DVT PROPHYLAXIS: Mechanical only  GI PROPHYLAXIS:  None  ADMISSION STATUS: Inpatient     CODE STATUS: DNR Code Status History    Date Active Date Inactive Code Status Order ID Comments User Context   11/29/2018 1201 12/04/2018 2149 DNR [id number]  Dustin Flock, MD Inpatient   11/28/2018 1800 11/29/2018 1201 Full  Code [id number]  Henreitta Leber, MD Inpatient    Questions for Most Recent Historical Code Status (Order [id number])    Question Answer Comment   In the event of cardiac or respiratory ARREST Do not call a "code blue"    In the event of cardiac or respiratory ARREST Do not perform Intubation, CPR, defibrillation or ACLS    In the event of cardiac or respiratory ARREST Use medication by any route, position, wound care, and other measures to relive pain and suffering. May use oxygen, suction and manual treatment of airway obstruction as needed for comfort.         Advance Directive Documentation     Most Recent Value  Type of Advance Directive  Healthcare Power of Attorney  Pre-existing out of facility DNR order (yellow form or pink MOST form)  -  "MOST" Form in Place?  -      TOTAL CRITICAL CARE TIME TAKING CARE OF THIS PATIENT: 50 minutes.   Faryn Sieg [place]ma 12/28/2018, 10:06 PM  [facility]  Office  [phone]  CC: Primary care physician; [name], MD  Note:  This document was prepared using Dragon voice recognition software and may include unintentional dictation errors.

## 2018-12-28 NOTE — ED Provider Notes (Signed)
[facility] Emergency Department Provider Note  ____________________________________________   First MD Initiated Contact with Patient 12/28/18 1856     (approximate)  I have reviewed the triage vital signs and the nursing notes.   HISTORY  Chief Complaint Headache   HPI Richard [name] is a 85 y.o. male with a history of atrial fibrillation on Coumadin who is presenting to the emergency department today with right-sided headache.  He says it started about 2 PM and has been a 5-6 out of 10.  Says that it is dull in quality.  Denies any weakness or numbness.  Says that he did fall proximally 1 month ago but otherwise has not had any falls.   Past Medical History:  Diagnosis Date  . Atrial fibrillation ([place])   . Cancer ([place])   . Chronic kidney disease   . Colon polyp   . Diabetes mellitus   . History of blood clotting disorder   . HTN (hypertension)     Patient Active Problem List   Diagnosis Date Noted  . Subdural hematoma ([place]) 12/28/2018  . PAF (paroxysmal atrial fibrillation) ([place]) 12/28/2018  . Diabetes ([place]) 12/28/2018  . CKD (chronic kidney disease), stage III ([place]) 12/28/2018  . HTN (hypertension) 12/28/2018  . Pressure injury of skin 11/30/2018  . Acute osteomyelitis of toe of right foot ([place]) 11/28/2018  . Malignant tumor of prostate ([place]) 10/26/2015  . Urinary tract infection with hematuria 01/11/2015  . Bladder neck obstruction 11/04/2012  . 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 Prostate cancer ([place]) 11/04/2012  . Retention of urine 05/06/2012  . History of hematuria 01/30/2012  . Malignant neoplasm of prostate ([place]) 09/30/2011  . Gross hematuria 09/19/2011  . Benign prostatic hyperplasia with urinary retention 09/10/2011  . Elevated prostate specific antigen (PSA) 09/10/2011    Past Surgical History:  Procedure Laterality Date  . AMPUTATION TOE Right 11/29/2018   Procedure: AMPUTATION TOE;  Surgeon: Albertine Patricia, DPM;   Location: [facility] ORS;  Service: Podiatry;  Laterality: Right;  . EYE SURGERY     CATARACTS  . HERNIA REPAIR    . LOWER EXTREMITY ANGIOGRAPHY Right 12/02/2018   Procedure: Lower Extremity Angiography;  Surgeon: Algernon Huxley, MD;  Location: [facility];  Service: Cardiovascular;  Laterality: Right;  . SKIN CANCER EXCISION      Prior to Admission medications   Medication Sig Start Date End Date Taking? Authorizing Provider  Alpha-Lipoic Acid 200 MG CAPS Take by mouth.    [provider]  ALPRAZolam Duanne Moron) 0.25 MG tablet Take 0.25 mg by mouth at bedtime as needed.      [provider]  amoxicillin-clavulanate (AUGMENTIN) 875-125 MG tablet Take 1 tablet by mouth 2 (two) times daily. 12/15/18   Tsosie Billing, MD  atorvastatin (LIPITOR) 10 MG tablet Take 10 mg by mouth daily.      [provider]  Calcium Carbonate (CALCIUM 600 PO) Take by mouth.      [provider]  Cholecalciferol (VITAMIN D) 2000 UNITS tablet Take 2,000 Units by mouth daily.      [provider]  CIALIS 5 MG tablet TK 1/2 T PO QD 06/16/17   [provider]  Coenzyme Q10 10 MG capsule Take 10 mg by mouth.    [provider]  colchicine 0.6 MG tablet Take 0.6 mg by mouth.    [provider]  Febuxostat (ULORIC) 80 MG TABS Take 1 tablet by mouth daily.     [provider]  glimepiride (AMARYL) 4 MG tablet Take 4 mg by mouth daily with breakfast.  06/15/17   [provider]  ibandronate (BONIVA) 150 MG tablet Take 150 mg by mouth every 30 (thirty) days. Take in the morning with a full glass of water, on an empty stomach, and do not take anything else by mouth or lie down for the next 30 min.     [provider]  Ibuprofen (ADVIL PO) Take by mouth.      [provider]  lisinopril (PRINIVIL,ZESTRIL) 5 MG tablet Take 5 mg by mouth daily.      [provider]  Multiple Vitamins-Minerals (MULTIVITAMIN ADULT  EXTRA C PO) Take by mouth.    [provider]  Multiple Vitamins-Minerals (MULTIVITAMIN ADULT PO) Take by mouth.    [provider]  nystatin-triamcinolone (MYCOLOG II) cream Apply to affected areas bid 05/08/16   [provider]  Red Yeast Rice 600 MG TABS Take 1 tablet by mouth daily.     [provider]  tadalafil (CIALIS) 5 MG tablet Take 5 mg by mouth every other day.  05/21/17   [provider]  Vitamins A & D (VITAMIN A & D) [address] UNITS TABS Take by mouth.      [provider]  warfarin (COUMADIN) 5 MG tablet Take 5 mg by mouth daily. Except MONDAYS and FRIDAYS.    [provider]  warfarin (COUMADIN) 7.5 MG tablet Take 7.5 mg by mouth. On MONDAYS and FRIDAYS    [provider]    Allergies Patient has no known allergies.  No family history on file.  Social History Social History   Tobacco Use  . Smoking status: Never Smoker  . Smokeless tobacco: Never Used  Substance Use Topics  . Alcohol use: No  . Drug use: Never    Review of Systems  Constitutional: No fever/chills Eyes: No visual changes. ENT: No sore throat. Cardiovascular: Denies chest pain. Respiratory: Denies shortness of breath. Gastrointestinal: No abdominal pain.  No nausea, no vomiting.  No diarrhea.  No constipation. Genitourinary: Negative for dysuria. Musculoskeletal: Negative for back pain. Skin: Negative for rash. Neurological: Negative for focal weakness or numbness.   ____________________________________________   PHYSICAL EXAM:  VITAL SIGNS: ED Triage Vitals  Enc Vitals Group     BP 12/28/18 1827 (!) 155/87     Pulse Rate 12/28/18 1827 88     Resp 12/28/18 1827 20     Temp 12/28/18 1827 97.7 F (36.5 C)     Temp Source 12/28/18 1827 Oral     SpO2 12/28/18 1827 98 %     Weight 12/28/18 1829 230 lb (104.3 kg)     Height 12/28/18 1829 6' (1.829 m)     Head Circumference --      Peak Flow --      Pain Score  12/28/18 1828 5     Pain Loc --      Pain Edu? --      Excl. in [place]? --     Constitutional: Alert and oriented. Well appearing and in no acute distress. Eyes: Conjunctivae are normal.  Head: Atraumatic. Nose: No congestion/rhinnorhea. Mouth/Throat: Mucous membranes are moist.  Neck: No stridor.   Cardiovascular: Normal rate, regular rhythm. Grossly normal heart sounds.  Respiratory: Normal respiratory effort.  No retractions. Lungs CTAB. Gastrointestinal: Soft and nontender. No distention. No CVA tenderness. Musculoskeletal: No lower extremity tenderness nor edema.  No joint effusions. Neurologic:  Normal speech  and language. No gross focal neurologic deficits are appreciated. Skin:  Skin is warm, dry and intact. No rash noted. Psychiatric: Mood and affect are normal. Speech and behavior are normal.  ____________________________________________   LABS (all labs ordered are listed, but only abnormal results are displayed)  Labs Reviewed  CBC WITH DIFFERENTIAL/PLATELET - Abnormal; Notable for the following components:      Result Value   WBC 12.4 (*)    Eosinophils Absolute 0.6 (*)    All other components within normal limits  COMPREHENSIVE METABOLIC PANEL - Abnormal; Notable for the following components:   Glucose, Bld 184 (*)    BUN 37 (*)    Creatinine, Ser 1.67 (*)    Albumin 3.3 (*)    GFR calc non Af Amer 37 (*)    GFR calc Af Amer 43 (*)    All other components within normal limits  PROTIME-INR - Abnormal; Notable for the following components:   Prothrombin Time 18.9 (*)    All other components within normal limits   ____________________________________________  EKG   ____________________________________________  RADIOLOGY  Head CT with right superior frontal and parietal subdural hematomas.  Maximum thickness 9 mm.  Mild localized mass-effect on the underlying brain parenchyma but no midline shift.  Appears acute in the parietal  region. ____________________________________________   PROCEDURES  Procedure(s) performed:   Procedures  Critical Care performed:   ____________________________________________   INITIAL IMPRESSION / ASSESSMENT AND PLAN / ED COURSE  Pertinent labs & imaging results that were available during my care of the patient were reviewed by me and considered in my medical decision making (see chart for details).  Differential diagnosis includes, but is not limited to, intracranial hemorrhage, meningitis/encephalitis, previous head trauma, cavernous venous thrombosis, tension headache, temporal arteritis, migraine or migraine equivalent, idiopathic intracranial hypertension, and non-specific headache. As part of my medical decision making, I reviewed the following data within the electronic MEDICAL RECORD NUMBER Notes from prior ED visits  ----------------------------------------- 730 PM on 12/28/2018 -----------------------------------------  I discussed case Dr. Lacinda Axon of neurosurgery who recommends IV vitamin K.  Pending INR at this time.  Possible Kcentra to be administered.  Dr. Lacinda Axon says he feels comfortable keeping the patient here at [facility] ICU admission with neuro checks q 1hr.    ----------------------------------------- 10:36 PM on 12/28/2018 -----------------------------------------  Patient with normal INR of 1.6.  Discussed case with Dr. Lacinda Axon.  We will not give any further reversal agents.  Patient will be admitted to the hospital.  Neurosurgery recommends every hour neurochecks and a repeat CT in 24 hours.  Dr. Lacinda Axon to follow.  Will be admitted to the hospitalist service, signed out to Dr. Jannifer Franklin.  Son, Richard [name], notified.   ____________________________________________   FINAL CLINICAL IMPRESSION(S) / ED DIAGNOSES  Subdural hematoma  NEW MEDICATIONS STARTED DURING THIS VISIT:  New Prescriptions   No medications on file     Note:  This document  was prepared using Dragon voice recognition software and may include unintentional dictation errors.     Orbie Pyo, MD 12/28/18 [phone]

## 2018-12-29 ENCOUNTER — Encounter: Payer: Self-pay | Admitting: Emergency Medicine

## 2018-12-29 ENCOUNTER — Other Ambulatory Visit: Payer: Self-pay

## 2018-12-29 ENCOUNTER — Inpatient Hospital Stay: Payer: Medicare Other

## 2018-12-29 LAB — CBC
HCT: 41.4 % (ref 39.0–52.0)
Hemoglobin: 13.7 g/dL (ref 13.0–17.0)
MCH: 30.5 pg (ref 26.0–34.0)
MCHC: 33.1 g/dL (ref 30.0–36.0)
MCV: 92.2 fL (ref 80.0–100.0)
Platelets: 189 10*3/uL (ref 150–400)
RBC: 4.49 MIL/uL (ref 4.22–5.81)
RDW: 14.1 % (ref 11.5–15.5)
WBC: 11.1 10*3/uL — ABNORMAL HIGH (ref 4.0–10.5)
nRBC: 0 % (ref 0.0–0.2)

## 2018-12-29 LAB — GLUCOSE, CAPILLARY
Glucose-Capillary: 122 mg/dL — ABNORMAL HIGH (ref 70–99)
Glucose-Capillary: 134 mg/dL — ABNORMAL HIGH (ref 70–99)
Glucose-Capillary: 158 mg/dL — ABNORMAL HIGH (ref 70–99)
Glucose-Capillary: 167 mg/dL — ABNORMAL HIGH (ref 70–99)
Glucose-Capillary: 169 mg/dL — ABNORMAL HIGH (ref 70–99)
Glucose-Capillary: 169 mg/dL — ABNORMAL HIGH (ref 70–99)

## 2018-12-29 LAB — BASIC METABOLIC PANEL
Anion gap: 9 (ref 5–15)
BUN: 38 mg/dL — ABNORMAL HIGH (ref 8–23)
CO2: 20 mmol/L — ABNORMAL LOW (ref 22–32)
Calcium: 8.5 mg/dL — ABNORMAL LOW (ref 8.9–10.3)
Chloride: 105 mmol/L (ref 98–111)
Creatinine, Ser: 1.59 mg/dL — ABNORMAL HIGH (ref 0.61–1.24)
GFR calc Af Amer: 45 mL/min — ABNORMAL LOW (ref >60)
GFR calc non Af Amer: 39 mL/min — ABNORMAL LOW (ref >60)
Glucose, Bld: 188 mg/dL — ABNORMAL HIGH (ref 70–99)
Potassium: 4.4 mmol/L (ref 3.5–5.1)
Sodium: 134 mmol/L — ABNORMAL LOW (ref 135–145)

## 2018-12-29 LAB — MRSA PCR SCREENING: MRSA by PCR: NEGATIVE

## 2018-12-29 MED ORDER — INSULIN ASPART 100 UNIT/ML ~~LOC~~ SOLN
0.0000 [IU] | SUBCUTANEOUS | Status: DC
  Administered 2018-12-29 (×3): 2 [IU] via SUBCUTANEOUS
  Filled 2018-12-29 (×3): qty 1

## 2018-12-29 MED ORDER — HYDROCODONE-ACETAMINOPHEN 5-325 MG PO TABS
1.0000 | ORAL_TABLET | Freq: Four times a day (QID) | ORAL | Status: DC | PRN
  Administered 2018-12-29 – 2018-12-31 (×5): 1 via ORAL
  Filled 2018-12-29 (×5): qty 1

## 2018-12-29 MED ORDER — HYDROCODONE-ACETAMINOPHEN 5-325 MG PO TABS: 1.0000 | ORAL_TABLET | Freq: Four times a day (QID) | ORAL | Status: DC | PRN

## 2018-12-29 MED ORDER — AMOXICILLIN-POT CLAVULANATE 875-125 MG PO TABS: 1.0000 | ORAL_TABLET | Freq: Two times a day (BID) | ORAL | Status: DC

## 2018-12-29 MED ORDER — ATORVASTATIN CALCIUM 20 MG PO TABS
10.0000 mg | ORAL_TABLET | Freq: Every day | ORAL | Status: DC
  Administered 2018-12-29 – 2019-01-04 (×7): 10 mg via ORAL
  Filled 2018-12-29 (×7): qty 1

## 2018-12-29 MED ORDER — LABETALOL HCL 5 MG/ML IV SOLN
10.0000 mg | INTRAVENOUS | Status: DC | PRN
  Administered 2018-12-31: 08:00:00 10 mg via INTRAVENOUS
  Filled 2018-12-29: qty 4

## 2018-12-29 MED ORDER — INSULIN ASPART 100 UNIT/ML ~~LOC~~ SOLN
0.0000 [IU] | Freq: Three times a day (TID) | SUBCUTANEOUS | Status: DC
  Administered 2018-12-29 – 2018-12-30 (×2): 2 [IU] via SUBCUTANEOUS
  Administered 2018-12-30 (×2): 3 [IU] via SUBCUTANEOUS
  Administered 2018-12-31: 2 [IU] via SUBCUTANEOUS
  Administered 2018-12-31: 3 [IU] via SUBCUTANEOUS
  Administered 2018-12-31 – 2019-01-01 (×2): 2 [IU] via SUBCUTANEOUS
  Administered 2019-01-01 – 2019-01-02 (×3): 3 [IU] via SUBCUTANEOUS
  Administered 2019-01-02: 5 [IU] via SUBCUTANEOUS
  Administered 2019-01-02 – 2019-01-03 (×2): 3 [IU] via SUBCUTANEOUS
  Administered 2019-01-03: 2 [IU] via SUBCUTANEOUS
  Administered 2019-01-03 – 2019-01-04 (×2): 3 [IU] via SUBCUTANEOUS
  Administered 2019-01-04: 12:00:00 5 [IU] via SUBCUTANEOUS
  Filled 2018-12-29 (×17): qty 1

## 2018-12-29 MED ORDER — HYDRALAZINE HCL 20 MG/ML IJ SOLN
10.0000 mg | INTRAMUSCULAR | Status: DC | PRN
  Administered 2018-12-29: 10 mg via INTRAVENOUS
  Filled 2018-12-29: qty 1

## 2018-12-29 MED ORDER — INSULIN ASPART 100 UNIT/ML ~~LOC~~ SOLN
0.0000 [IU] | Freq: Every day | SUBCUTANEOUS | Status: DC
  Filled 2018-12-29: qty 1

## 2018-12-29 MED ORDER — ALPRAZOLAM 0.25 MG PO TABS
0.2500 mg | ORAL_TABLET | Freq: Every evening | ORAL | Status: DC | PRN
  Administered 2018-12-31: 0.25 mg via ORAL
  Filled 2018-12-29 (×2): qty 1

## 2018-12-29 MED ORDER — AMOXICILLIN-POT CLAVULANATE 875-125 MG PO TABS
1.0000 | ORAL_TABLET | Freq: Two times a day (BID) | ORAL | Status: AC
  Administered 2018-12-29 (×2): 1 via ORAL
  Filled 2018-12-29 (×3): qty 1

## 2018-12-29 MED ORDER — ONDANSETRON HCL 4 MG PO TABS: 4.0000 mg | ORAL_TABLET | Freq: Four times a day (QID) | ORAL | Status: DC | PRN

## 2018-12-29 MED ORDER — ONDANSETRON HCL 4 MG/2ML IJ SOLN: 4.0000 mg | Freq: Four times a day (QID) | INTRAMUSCULAR | Status: DC | PRN

## 2018-12-29 NOTE — Progress Notes (Signed)
[place] [facility] [place] NAME: Richard [name]    MR#:  [id number]  DATE OF BIRTH:  Apr 12, 1931  SUBJECTIVE:  CHIEF COMPLAINT:   Chief Complaint  Patient presents with  . Headache   Better headache. REVIEW OF SYSTEMS:  Review of Systems  Constitutional: Positive for malaise/fatigue. Negative for chills and fever.  HENT: Negative for sore throat.   Eyes: Negative for blurred vision and double vision.  Respiratory: Negative for cough, hemoptysis, shortness of breath, wheezing and stridor.   Cardiovascular: Negative for chest pain, palpitations, orthopnea and leg swelling.  Gastrointestinal: Negative for abdominal pain, blood in stool, diarrhea, melena, nausea and vomiting.  Genitourinary: Negative for dysuria, flank pain and hematuria.  Musculoskeletal: Negative for back pain and joint pain.  Skin: Negative for rash.  Neurological: Positive for headaches. Negative for dizziness, sensory change, focal weakness, seizures, loss of consciousness and weakness.  Endo/Heme/Allergies: Negative for polydipsia.  Psychiatric/Behavioral: Negative for depression. The patient is not nervous/anxious.     DRUG ALLERGIES:  No Known Allergies VITALS:  Blood pressure 131/74, pulse 83, temperature 98.2 F (36.8 C), temperature source Oral, resp. rate (!) 25, height 6' (1.829 m), weight 89 kg, SpO2 97 %. PHYSICAL EXAMINATION:  Physical Exam Constitutional:      General: He is not in acute distress.    Appearance: Normal appearance.  HENT:     Head: Normocephalic.     Mouth/Throat:     Mouth: Mucous membranes are moist.  Eyes:     General: No scleral icterus.    Conjunctiva/sclera: Conjunctivae normal.     Pupils: Pupils are equal, round, and reactive to light.  Neck:     Musculoskeletal: Normal range of motion and neck supple.     Vascular: No JVD.     Trachea: No tracheal deviation.  Cardiovascular:     Rate and Rhythm: Normal rate and regular  rhythm.     Heart sounds: Normal heart sounds. No murmur. No gallop.   Pulmonary:     Effort: Pulmonary effort is normal. No respiratory distress.     Breath sounds: Normal breath sounds. No stridor. No wheezing or rales.  Abdominal:     General: Bowel sounds are normal. There is no distension.     Palpations: Abdomen is soft.     Tenderness: There is no abdominal tenderness. There is no rebound.  Musculoskeletal: Normal range of motion.        General: No tenderness.     Right lower leg: No edema.     Left lower leg: No edema.  Skin:    Findings: No erythema or rash.  Neurological:     General: No focal deficit present.     Mental Status: He is alert and oriented to person, place, and time. Mental status is at baseline.     Cranial Nerves: No cranial nerve deficit.     Sensory: No sensory deficit.     Motor: No weakness.     Coordination: Coordination normal.    LABORATORY PANEL:  Male CBC Recent Labs  Lab 12/29/18 0232  WBC 11.1*  HGB 13.7  HCT 41.4  PLT 189   ------------------------------------------------------------------------------------------------------------------ Chemistries  Recent Labs  Lab 12/28/18 1833 12/29/18 0232  NA 135 134*  K 4.7 4.4  CL 103 105  CO2 22 20*  GLUCOSE 184* 188*  BUN 37* 38*  CREATININE 1.67* 1.59*  CALCIUM 9.0 8.5*  AST 23  --   ALT 14  --  ALKPHOS 121  --   BILITOT 0.7  --    RADIOLOGY:  Ct Head Wo Contrast  Result Date: 12/29/2018 CLINICAL DATA:  85 y/o  M; subdural hematoma for follow-up. EXAM: CT HEAD WITHOUT CONTRAST TECHNIQUE: Contiguous axial images were obtained from the base of the skull through the vertex without intravenous contrast. COMPARISON:  12/28/2018 CT head FINDINGS: Brain: Stable mixed attenuation subdural hematoma over the right cerebral convexity. Stable mild mass effect on the underlying brain with sulcal effacement. No midline shift. No new intracranial hemorrhage, mass effect, stroke,  hydrocephalus, or herniation. Stable nonspecific white matter hypodensities compatible with chronic microvascular ischemic changes. Stable volume loss of the brain. Vascular: No hyperdense vessel or unexpected calcification. Skull: Normal. Negative for fracture or focal lesion. Sinuses/Orbits: No acute finding. Other: Bilateral intra-ocular lens replacement. IMPRESSION: 1. Stable mixed attenuation subdural hematoma over right cerebral convexity. Stable mild mass effect on the underlying brain. No midline shift. 2. No new acute intracranial abnormality. 3. Stable chronic microvascular ischemic changes and volume loss of the brain. Electronically Signed   By: Kristine Garbe M.D.   On: 12/29/2018 05:55   Ct Head Wo Contrast  Result Date: 12/28/2018 CLINICAL DATA:  Acute onset severe headache EXAM: CT HEAD WITHOUT CONTRAST TECHNIQUE: Contiguous axial images were obtained from the base of the skull through the vertex without intravenous contrast. COMPARISON:  None. FINDINGS: Brain: There is mild diffuse atrophy. There is a subdural hematoma on the right with mixed attenuation fluid. Acute appearing hematoma is noted in the right parietal lobe region. The maximum thickness of this acute appearing subdural hematoma is 8 mm. More anteriorly in the right frontal lobe region, there is a 9 mm in thickness subdural hematoma with fluid of attenuation near that of surrounding brain parenchyma suggesting more subacute component. No other extra-axial fluid collections are identified. There is no mass or intra-axial hemorrhage. There is no midline shift. There is mild periventricular small vessel disease. No acute infarct is demonstrable. Vascular: No hyperdense vessel. There is calcification in each carotid siphon region. Skull: The bony calvarium appears intact. Sinuses/Orbits: There is mucosal thickening in several ethmoid air cells. Other visualized paranasal sinuses are clear. Orbits appear symmetric bilaterally.  Patient has had cataract removals bilaterally. Other: Visualized mastoid air cells are clear. IMPRESSION: Right superior frontal and parietal subdural hematomas. Maximum thickness of subdural hematoma is 9 mm. There is mild localized mass effect on the underlying brain parenchyma, but there is no midline shift. The subdural hematoma appears acute in the parietal region in subacute in the right frontal region. No other extra-axial fluid collections. No intra-axial mass or hemorrhage. There is mild underlying atrophy with small vessel disease in the centra semiovale bilaterally. No acute infarct evident. There are foci of arterial vascular calcification. There is mucosal thickening in several ethmoid air cells. Critical Value/emergent results were called by telephone at the time of interpretation on 12/28/2018 at 7:13 pm to Dr. Larae Grooms , who verbally acknowledged these results. Electronically Signed   By: Lowella Grip III M.D.   On: 12/28/2018 19:13   ASSESSMENT AND PLAN:    Acute on chronic Subdural hematoma Stable per repeated head CT this morning. Per Dr. Lacinda Axon, follow-up neurosurgery clinic in 10 to 14 days for repeat CT head. No blood thinners until then although DVT prophylaxis with heparin is OK in hospital.   HTN (hypertension) -home dose antihypertensives and additional PRN antihypertensives with blood pressure goal less than 140/90   PAF (paroxysmal atrial  fibrillation) ([place]) -continue home meds except for anticoagulation, vitamin K reversal given.   Diabetes ([place]) -continue scale insulin coverage   CKD (chronic kidney disease), stage III ([place]) -at baseline, avoid nephrotoxins and monitor  All the records are reviewed and case discussed with Care Management/Social Worker. Management plans discussed with the patient, family and they are in agreement.  CODE STATUS: DNR  TOTAL TIME TAKING CARE OF THIS PATIENT: 26 minutes.   More than 50% of the time was spent in  counseling/coordination of care: YES  POSSIBLE D/C IN 2 DAYS, DEPENDING ON CLINICAL CONDITION.   Demetrios Loll M.D on 12/29/2018 at 3:47 PM  Between 7am to 6pm - Pager - [phone]  After 6pm go to [email or web address] - [facility]

## 2018-12-29 NOTE — Consult Note (Signed)
[facility] Nurse wound consult note Reason for Consult: Amputation site at right great toe Wound type:surgical Pressure Injury POA:NA Measurement: Open area measures 2.1cm x 1.2cm x 0.2cm with 90% red, 10% yellow tissue.  Scant serous drainage.  Sutures on incision line are intact. Wound bed:As noted abobve Drainage (amount, consistency, odor) As noted above Periwound:Intact without periowound edema, erythema, induration or warmth. Dressing procedure/placement/frequency: I will discontinue the home NPWT unit while in house in favor of a conservative dressing using xeroform gauze. Patient reports he had an appointment today to see surgeon. A more conservative dressing will facilitate examination. NPWT device is packaged and ready for transport home by a fmaily member or send with patient upon discharge.   [facility] nursing team will not follow, but will remain available to this patient, the nursing and medical teams.  Please re-consult if needed. Thanks, Maudie Flakes, MSN, RN, [place], [facility]  Pager# [phone]

## 2018-12-29 NOTE — Consult Note (Signed)
Neurosurgery-New Consultation Evaluation 12/29/2018 DOV DILL [id number]  Identifying Statement: Richard [name] is a 85 y.o. male from [name] [id number] with subdural hematoma  Physician Requesting Consultation: Lance Coon, MD   History of Present Illness: Mr. Richard [name] is admitted from the ED after presenting with headaches. He recounts a fall weeks ago but nothing recent. He was on Coumadin and INR of 2. CT head showed some acute on chronic subdural hematoma. He was at his neurological baseline with good blood pressure control and was admitted to the ICU for monitoring. He was given Vitamin K in the ED.   Past Medical History:  Past Medical History:  Diagnosis Date  . Atrial fibrillation ([place])   . Cancer ([place])   . Chronic kidney disease   . Colon polyp   . Diabetes mellitus   . History of blood clotting disorder   . HTN (hypertension)     Social History: Social History   Socioeconomic History  . Marital status: Widowed    Spouse name: Not on file  . Number of children: Not on file  . Years of education: Not on file  . Highest education level: Not on file  Occupational History  . Occupation: retired    Comment: Theatre manager  . 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 Financial resource strain: Not very hard  . Food insecurity:    Worry: Not on file    Inability: Not on file  . Transportation needs:    Medical: No    Non-medical: No  Tobacco Use  . Smoking status: Never Smoker  . Smokeless tobacco: Never Used  Substance and Sexual Activity  . Alcohol use: No  . Drug use: Never  . Sexual activity: Not Currently  Lifestyle  . Physical activity:    Days per week: 0 days    Minutes per session: Not on file  . Stress: Not on file  Relationships  . Social connections:    Talks on phone: More than three times a week    Gets together: More than three times a week    Attends religious service: Not on file    Active member of  club or organization: Not on file    Attends meetings of clubs or organizations: Not on file    Relationship status: Widowed  . Intimate partner violence:    Fear of current or ex partner: No    Emotionally abused: No    Physically abused: No    Forced sexual activity: No  Other Topics Concern  . Not on file  Social History Narrative  . Not on file   Living arrangements (living alone, with partner): Lives in [place]  Family History: No family history on file.  Review of Systems:  Review of Systems - General ROS: Negative Psychological ROS: Negative Ophthalmic ROS: Negative ENT ROS: Negative Hematological and Lymphatic ROS: Negative  Endocrine ROS: Negative Respiratory ROS: Negative Cardiovascular ROS: Negative Gastrointestinal ROS: Negative Genito-Urinary ROS: Negative Musculoskeletal ROS: Negative Neurological ROS: Positive for headache Dermatological ROS: Negative  Physical Exam: BP (!) 95/53   Pulse 68   Temp 98.2 F (36.8 C) (Oral)   Resp 18   Ht 6' (1.829 m)   Wt 89 kg   SpO2 97%   BMI 26.61 kg/m  Body mass index is 26.61 kg/m. Body surface area is 2.13 meters squared. General appearance: Alert, cooperative, in no acute distress Head: Normocephalic, atraumatic Ext: No edema  in LE bilaterally, warm extremities  Neurologic exam:  Mental status: alertness: alert, orientation: person, place, time, affect: normal Speech: fluent and clear, names 3/4 objects with difficulty with more complex ones, repeats 2/3 Cranial nerves:  III/IV/VI: extra-ocular motions intact bilaterally V/VII:no evidence of facial droop or weakness  VIII: hearing slightly diminished XI: trapezius strength symmetric XII: tongue strength symmetric  Motor:strength symmetric 5/5 except for grip in left hand which is 4/5 Sensory: intact to light touch in all extremities Gait: not tested  Laboratory: Results for orders placed or performed during the hospital encounter of 12/28/18  MRSA PCR  Screening  Result Value Ref Range   MRSA by PCR NEGATIVE NEGATIVE  CBC with Differential  Result Value Ref Range   WBC 12.4 (H) 4.0 - 10.5 K/uL   RBC 5.02 4.22 - 5.81 MIL/uL   Hemoglobin 15.4 13.0 - 17.0 g/dL   HCT 47.1 39.0 - 52.0 %   MCV 93.8 80.0 - 100.0 fL   MCH 30.7 26.0 - 34.0 pg   MCHC 32.7 30.0 - 36.0 g/dL   RDW 13.8 11.5 - 15.5 %   Platelets 200 150 - 400 K/uL   nRBC 0.0 0.0 - 0.2 %   Neutrophils Relative % 57 %   Neutro Abs 7.2 1.7 - 7.7 K/uL   Lymphocytes Relative 29 %   Lymphs Abs 3.6 0.7 - 4.0 K/uL   Monocytes Relative 8 %   Monocytes Absolute 0.9 0.1 - 1.0 K/uL   Eosinophils Relative 5 %   Eosinophils Absolute 0.6 (H) 0.0 - 0.5 K/uL   Basophils Relative 1 %   Basophils Absolute 0.1 0.0 - 0.1 K/uL   WBC Morphology MORPHOLOGY UNREMARKABLE    RBC Morphology MORPHOLOGY UNREMARKABLE    Smear Review Normal platelet morphology    Immature Granulocytes 0 %   Abs Immature Granulocytes 0.05 0.00 - 0.07 K/uL  Comprehensive metabolic panel  Result Value Ref Range   Sodium 135 135 - 145 mmol/L   Potassium 4.7 3.5 - 5.1 mmol/L   Chloride 103 98 - 111 mmol/L   CO2 22 22 - 32 mmol/L   Glucose, Bld 184 (H) 70 - 99 mg/dL   BUN 37 (H) 8 - 23 mg/dL   Creatinine, Ser 1.67 (H) 0.61 - 1.24 mg/dL   Calcium 9.0 8.9 - 10.3 mg/dL   Total Protein 7.6 6.5 - 8.1 g/dL   Albumin 3.3 (L) 3.5 - 5.0 g/dL   AST 23 15 - 41 U/L   ALT 14 0 - 44 U/L   Alkaline Phosphatase 121 38 - 126 U/L   Total Bilirubin 0.7 0.3 - 1.2 mg/dL   GFR calc non Af Amer 37 (L) >60 mL/min   GFR calc Af Amer 43 (L) >60 mL/min   Anion gap 10 5 - 15  Protime-INR  Result Value Ref Range   Prothrombin Time 18.9 (H) 11.4 - 15.2 seconds   INR 1.60   Glucose, capillary  Result Value Ref Range   Glucose-Capillary 167 (H) 70 - 99 mg/dL  Basic metabolic panel  Result Value Ref Range   Sodium 134 (L) 135 - 145 mmol/L   Potassium 4.4 3.5 - 5.1 mmol/L   Chloride 105 98 - 111 mmol/L   CO2 20 (L) 22 - 32 mmol/L    Glucose, Bld 188 (H) 70 - 99 mg/dL   BUN 38 (H) 8 - 23 mg/dL   Creatinine, Ser 1.59 (H) 0.61 - 1.24 mg/dL   Calcium 8.5 (L) 8.9 -  10.3 mg/dL   GFR calc non Af Amer 39 (L) >60 mL/min   GFR calc Af Amer 45 (L) >60 mL/min   Anion gap 9 5 - 15  CBC  Result Value Ref Range   WBC 11.1 (H) 4.0 - 10.5 K/uL   RBC 4.49 4.22 - 5.81 MIL/uL   Hemoglobin 13.7 13.0 - 17.0 g/dL   HCT 41.4 39.0 - 52.0 %   MCV 92.2 80.0 - 100.0 fL   MCH 30.5 26.0 - 34.0 pg   MCHC 33.1 30.0 - 36.0 g/dL   RDW 14.1 11.5 - 15.5 %   Platelets 189 150 - 400 K/uL   nRBC 0.0 0.0 - 0.2 %  Glucose, capillary  Result Value Ref Range   Glucose-Capillary 169 (H) 70 - 99 mg/dL  Glucose, capillary  Result Value Ref Range   Glucose-Capillary 169 (H) 70 - 99 mg/dL    Imaging: CT Head: 1. Stable mixed attenuation subdural hematoma over right cerebral convexity. Stable mild mass effect on the underlying brain. No midline shift. 2. No new acute intracranial abnormality. 3. Stable chronic microvascular ischemic changes and volume loss of the brain.   Impression/Plan:  Mr. Moultrie is here with a acute on chronic subdural hematoma that appears stable on repeat imaging. He has had Coumadin reversed and he appears to be at neurological baseline. He does have some features of early dementia but I am unsure of his baseline. Additionally, he has some pain and weakness in his left hand and I am not sure of the etiology but do not feel related to the SDH. Given small size and stability, no surgical intervention recommended. I would keep him off the coumadin until a scan in few weeks to ensure no expansion. He will need PT and falls avoidance at home.    1.  Diagnosis: Acute on chronic subdural hematoma  2.  Plan - Follow up in neurosurgery clinic in 10-14 days for repeat CT head - No blood thinners until then although DVT prophylaxis with heparin is OK in hospital - Would benefit from PT for falls evaluation - If any neurological  change, please call on call neurosurgery provider  Deetta Perla [phone]

## 2018-12-29 NOTE — Progress Notes (Signed)
eLink Physician-Brief Progress Note Patient Name: Richard [name] DOB: 1931/08/31 MRN: [id number]   Date of Service  12/29/2018  HPI/Events of Note  85 yo male with hx of atrial fibrillation on coumadin admitted to ICU with 9 mm subdural hematoma for closer monitoring. INR = 1.60. PCCM asked to assume care. VSS.  eICU Interventions  No new orders.      Intervention Category Evaluation Type: New Patient Evaluation  Lysle Dingwall 12/29/2018, 12:25 AM

## 2018-12-30 ENCOUNTER — Inpatient Hospital Stay: Payer: Medicare Other

## 2018-12-30 LAB — GLUCOSE, CAPILLARY
Glucose-Capillary: 160 mg/dL — ABNORMAL HIGH (ref 70–99)
Glucose-Capillary: 165 mg/dL — ABNORMAL HIGH (ref 70–99)
Glucose-Capillary: 124 mg/dL — ABNORMAL HIGH (ref 70–99)
Glucose-Capillary: 122 mg/dL — ABNORMAL HIGH (ref 70–99)

## 2018-12-30 MED ORDER — METOPROLOL TARTRATE 25 MG PO TABS
12.5000 mg | ORAL_TABLET | Freq: Two times a day (BID) | ORAL | Status: DC
  Administered 2018-12-30 – 2019-01-04 (×11): 12.5 mg via ORAL
  Filled 2018-12-30 (×12): qty 1

## 2018-12-30 MED ORDER — METOPROLOL TARTRATE 25 MG PO TABS: 12.5000 mg | ORAL_TABLET | Freq: Two times a day (BID) | ORAL | 0 refills | Status: DC

## 2018-12-30 NOTE — Progress Notes (Signed)
[facility] [facility] [place] NAME: Richard [name]    MR#:  [id number]  DATE OF BIRTH:  [birth year]  SUBJECTIVE:  CHIEF COMPLAINT:   Chief Complaint  Patient presents with  . Headache   Right side headache. Per physical therapist, facial droop and left arm weakness during PT. REVIEW OF SYSTEMS:  Review of Systems  Constitutional: Positive for malaise/fatigue. Negative for chills and fever.  HENT: Negative for sore throat.   Eyes: Negative for blurred vision and double vision.  Respiratory: Negative for cough, hemoptysis, shortness of breath, wheezing and stridor.   Cardiovascular: Negative for chest pain, palpitations, orthopnea and leg swelling.  Gastrointestinal: Negative for abdominal pain, blood in stool, diarrhea, melena, nausea and vomiting.  Genitourinary: Negative for dysuria, flank pain and hematuria.  Musculoskeletal: Positive for joint pain. Negative for back pain.       Left shoulder pain  Skin: Negative for rash.  Neurological: Positive for headaches. Negative for dizziness, sensory change, focal weakness, seizures, loss of consciousness and weakness.  Endo/Heme/Allergies: Negative for polydipsia.  Psychiatric/Behavioral: Negative for depression. The patient is not nervous/anxious.     DRUG ALLERGIES:  No Known Allergies VITALS:  Blood pressure 128/74, pulse 66, temperature 97.6 F (36.4 C), temperature source Oral, resp. rate 20, height 6' (1.829 m), weight 89 kg, SpO2 96 %. PHYSICAL EXAMINATION:  Physical Exam Constitutional:      General: He is not in acute distress.    Appearance: Normal appearance.  HENT:     Head: Normocephalic.     Mouth/Throat:     Mouth: Mucous membranes are moist.  Eyes:     General: No scleral icterus.    Conjunctiva/sclera: Conjunctivae normal.     Pupils: Pupils are equal, round, and reactive to light.  Neck:     Musculoskeletal: Normal range of motion and neck supple.     Vascular: No  JVD.     Trachea: No tracheal deviation.  Cardiovascular:     Rate and Rhythm: Normal rate and regular rhythm.     Heart sounds: Normal heart sounds. No murmur. No gallop.   Pulmonary:     Effort: Pulmonary effort is normal. No respiratory distress.     Breath sounds: Normal breath sounds. No stridor. No wheezing or rales.  Abdominal:     General: Bowel sounds are normal. There is no distension.     Palpations: Abdomen is soft.     Tenderness: There is no abdominal tenderness. There is no rebound.  Musculoskeletal:        General: No tenderness.     Right lower leg: No edema.     Left lower leg: No edema.     Comments: Unable to move left shoulder due to pain.  Skin:    Findings: No erythema or rash.  Neurological:     General: No focal deficit present.     Mental Status: He is alert and oriented to person, place, and time. Mental status is at baseline.     Cranial Nerves: No cranial nerve deficit.     Sensory: No sensory deficit.     Motor: No weakness.     Coordination: Coordination normal.    LABORATORY PANEL:  Male CBC Recent Labs  Lab 12/29/18 0232  WBC 11.1*  HGB 13.7  HCT 41.4  PLT 189   ------------------------------------------------------------------------------------------------------------------ Chemistries  Recent Labs  Lab 12/28/18 1833 12/29/18 0232  NA 135 134*  K 4.7 4.4  CL 103  105  CO2 22 20*  GLUCOSE 184* 188*  BUN 37* 38*  CREATININE 1.67* 1.59*  CALCIUM 9.0 8.5*  AST 23  --   ALT 14  --   ALKPHOS 121  --   BILITOT 0.7  --    RADIOLOGY:  Dg Shoulder Left  Result Date: 12/30/2018 CLINICAL DATA:  Left shoulder pain.  No known injury. EXAM: LEFT SHOULDER - 2+ VIEW COMPARISON:  None. FINDINGS: There is no acute bony or joint abnormality. Mild to moderate acromioclavicular and mild glenohumeral osteoarthritis is seen. Soft tissues are unremarkable. IMPRESSION: No acute abnormality. Mild to moderate acromioclavicular and mild glenohumeral  osteoarthritis. Electronically Signed   By: Inge Rise M.D.   On: 12/30/2018 08:51   ASSESSMENT AND PLAN:    Acute on chronic Subdural hematoma Stable per repeated head CT this morning. Per Dr. Lacinda Axon, follow-up neurosurgery clinic in 10 to 14 days for repeat CT head. No blood thinners until then although DVT prophylaxis with heparin is OK in hospital. Repeat CT head now.    HTN (hypertension) -home dose antihypertensives and additional PRN antihypertensives with blood pressure goal less than 140/90   PAF (paroxysmal atrial fibrillation) ([place]) -continue home meds except for anticoagulation, vitamin K reversal given.   Diabetes ([place]) -continue scale insulin coverage   CKD (chronic kidney disease), stage III ([place]) -at baseline, avoid nephrotoxins and monitor  Left shoulder pain, xray: arthritis.  PT and OT. All the records are reviewed and case discussed with Care Management/Social Worker. Management plans discussed with the patient, his son and they are in agreement.  CODE STATUS: DNR  TOTAL TIME TAKING CARE OF THIS PATIENT: 30 minutes.   More than 50% of the time was spent in counseling/coordination of care: YES  POSSIBLE D/C IN 1-2 DAYS, DEPENDING ON CLINICAL CONDITION.   Demetrios Loll M.D on 12/30/2018 at 1:02 PM  Between 7am to 6pm - Pager - [phone]  After 6pm go to [email or web address] - [facility]

## 2018-12-30 NOTE — Evaluation (Signed)
Occupational Therapy Evaluation Patient Details Name: Richard [name] MRN: [id number] DOB: 05-06-1931 Today's Date: 12/30/2018    History of Present Illness PT is 85 yo male with PMH of recent admit, HTN,PAF,DM, CKDIII, per last podiatry note WBAT in post op shoe on RLE, minimize ambulation. Pt admitted for acute on chronic subdural hematoma.    Clinical Impression   Pt seen for OT evaluation this date. Prior to hospital admission, pt required assistance from his son or PCA for ADL/IADL/ and functional mobility. Pt lives alone in a single story home with 1 step to enter.  Currently pt demonstrates impairments in cognitive function and UE/LE strength/ROM requiring +2 mod assist for ADL and mobility.  Pt would benefit from skilled OT to address noted impairments and functional limitations (see below for any additional details) in order to maximize safety and independence while minimizing falls risk and caregiver burden.  Upon hospital discharge, recommend pt discharge to SNF.     Follow Up Recommendations  Supervision/Assistance - 24 hour;SNF    Equipment Recommendations  3 in 1 bedside commode;Toilet rise with handles;Tub/shower seat(Additional equipment TBD at next venue of care. )    Recommendations for Other Services       Precautions / Restrictions Precautions Precautions: Fall Precaution Comments: post op shoe for mobility, limit ambulation per last podiatry note Restrictions Weight Bearing Restrictions: Yes RLE Weight Bearing: Weight bearing as tolerated Other Position/Activity Restrictions: through heel      Mobility Bed Mobility Overal bed mobility: Needs Assistance Bed Mobility: Sit to Supine     Supine to sit: Min assist Sit to supine: Mod assist   General bed mobility comments: for LE management and trunk elevation  Transfers Overall transfer level: Needs assistance   Transfers: Sit to/from Stand;Stand Pivot Transfers Sit to Stand: Mod assist;+2 physical  assistance;+2 safety/equipment Stand pivot transfers: Mod assist;+2 physical assistance;+2 safety/equipment       General transfer comment: Pt able to transfer from recliner to his bed on this date with +2 assistance and VC for sequencing.    Balance Overall balance assessment: Needs assistance Sitting-balance support: Feet supported;Single extremity supported Sitting balance-Leahy Scale: Fair     Standing balance support: Bilateral upper extremity supported Standing balance-Leahy Scale: Zero Standing balance comment: Pt required max assist with BUE supported in order to maintain balance when standing on this date.                           ADL either performed or assessed with clinical judgement   ADL Overall ADL's : Needs assistance/impaired                                     Functional mobility during ADLs: +2 for safety/equipment;Cueing for safety;Cueing for sequencing;+2 for physical assistance;Moderate assistance General ADL Comments: At baseline, pt requires assistance for ADL/IADL. ADLs not formally assessed on this date, however deficits in UE/LE strength and endurance suggest global impairments in ADL function. No caregiver present to determine ADL baseline, however pt endorces requiring assistance for bathing, dressing, cooking, and cleaning. Functional mobility on this date +2 for safety and equipment.  Pt with good awareness of WBAT status in RLE.      Vision Baseline Vision/History: No visual deficits Patient Visual Report: No change from baseline       Perception     Praxis  Pertinent Vitals/Pain Pain Assessment: 0-10 Pain Score: 6  Faces Pain Scale: Hurts a little bit Pain Location: headache, shoulder pain.  Pain Descriptors / Indicators: Aching;Headache;Guarding;Grimacing Pain Intervention(s): Limited activity within patient's tolerance;Monitored during session;Repositioned     Hand Dominance     Extremity/Trunk  Assessment Upper Extremity Assessment Upper Extremity Assessment: Generalized weakness RUE Deficits / Details: Unable to fully assess 2/2 pt needing to leave for CT. Will assess next visit. Noted generalized weakness with bed mobility and stand pivot transfer. Pt reports Right shoulder pain with movement.  RUE Sensation: WNL LUE Deficits / Details: Unable to lift LUE above 90deg due to pt reported pain. grip strength 3+/5 with extended time, full elbow ROM LUE: Unable to fully assess due to pain LUE Sensation: WNL   Lower Extremity Assessment Lower Extremity Assessment: Defer to PT evaluation;Generalized weakness RLE Deficits / Details: Pt with surgical shoe on RLE; WBAT. Pt requiring +2 assist for stand pivot transfer from recliner to bed on this date.  LLE Deficits / Details: 2+/5 for knee extension, hamstring 3+/5, DF 4/5, poor hip flexion noted. LLE Sensation: WNL       Communication Communication Communication: Other (comment)(Garbled speech, difficulty with word finding, some stuttering. )   Cognition Arousal/Alertness: Awake/alert Behavior During Therapy: WFL for tasks assessed/performed Overall Cognitive Status: No family/caregiver present to determine baseline cognitive functioning                                 General Comments: Pt difficult to re-direct at times. Had difficulty following conversation. Regularly spoke about his wife who he reports passed away in this hospital 1 month ago. OT provided active listening and offered to request chaplain services to visit pt. Pt declined chaplain services at this time.    General Comments  Pt seated in recliner upon arrival to room. No family present. Pt had difficulty following conversation, and would regularly tell tangentally related stories when asked questions about his home environment/set-up. Pt also mentioned his wife's passing multiple times throughout the session. Session ended RN arrived to take pt to CT.  Further assessment of functional abilities my be required.     Exercises Other Exercises Other Exercises: Provided active listening as pt discussed his wife who recently passed away. Discussed hospital chaplain as a possible resource to pt. Provided information on [place] care.     Shoulder Instructions      Home Living Family/patient expects to be discharged to:: Private residence Living Arrangements: Alone Available Help at Discharge: Family;Personal care attendant(pt endorses having a PCA 40 hrs/week and a son who lives near by to assist. ) Type of Home: House Home Access: Stairs to enter [employer] of Steps: 1 Entrance Stairs-Rails: None Home Layout: One level         Biochemist, clinical: [place]: Environmental consultant - 2 wheels;Walker - 4 wheels   Additional Comments: Per PT note: "family reported 1 fall 2.5 months ago"      Prior Functioning/Environment Level of Independence: Needs assistance  Gait / Transfers Assistance Needed: Needs assistance from son for transfers, able to ambulate to bathroom/household distances with caregiver and son with [phone].  ADL's / Homemaking Assistance Needed: dependent for ADLs   Comments:  Per PT note: "Pt is alone from 7pm to 7am. Son reported living about 165ft away in a different house."        OT Problem List: Decreased  strength;Decreased range of motion;Decreased activity tolerance;Impaired balance (sitting and/or standing);Decreased coordination;Decreased cognition;Decreased safety awareness;Decreased knowledge of use of DME or AE;Pain      OT Treatment/Interventions: Self-care/ADL training;Therapeutic exercise;Therapeutic activities;Energy conservation;DME and/or AE instruction;Patient/family education    OT Goals(Current goals can be found in the care plan section) Acute Rehab OT Goals Patient Stated Goal: Pt wants to feel better OT Goal Formulation: With patient Time For Goal Achievement:  01/13/19 Potential to Achieve Goals: Good ADL Goals Pt Will Transfer to Toilet: with mod assist;bedside commode;stand pivot transfer Pt Will Perform Toileting - Clothing Manipulation and hygiene: with caregiver independent in assisting;with min assist;sit to/from stand;with adaptive equipment(With LRAD for safety.) Pt Will Perform Tub/Shower Transfer: with caregiver independent in assisting;Stand pivot transfer;with mod assist;tub bench(With LRAD/DME for safety.) Additional ADL Goal #1: Pt will utilize learned bed mobility techniques to complete transfers for sitting ADL tasks.  OT Frequency: Min 1X/week   Barriers to D/C: Decreased caregiver support  Pt lives alone without 24/7 assistance        Co-evaluation              AM-PAC OT "6 Clicks" Daily Activity     Outcome Measure Help from another person eating meals?: A Little Help from another person taking care of personal grooming?: A Little Help from another person toileting, which includes using toliet, bedpan, or urinal?: A Lot Help from another person bathing (including washing, rinsing, drying)?: A Lot Help from another person to put on and taking off regular upper body clothing?: A Lot Help from another person to put on and taking off regular lower body clothing?: A Lot 6 Click Score: 14   End of Session Equipment Utilized During Treatment: Gait belt  Activity Tolerance: Patient tolerated treatment well;No increased pain Patient left: in bed;with nursing/sitter in room;Other (comment)(Pt left with RN and transportation to go to CT.)  OT Visit Diagnosis: Unsteadiness on feet (R26.81);Other abnormalities of gait and mobility (R26.89);History of falling (Z91.81);Pain Pain - Right/Left: Right Pain - part of body: Shoulder                Time: 0174-9449 OT Time Calculation (min): 29 min Charges:  OT General Charges $OT Visit: 1 Visit OT Evaluation $OT Eval Moderate Complexity: 1 Mod OT Treatments $Self Care/Home  Management : 8-22 mins  Shara Blazing, M.S., OTR/L Ascom: [phone] 12/30/18, 2:36 PM

## 2018-12-30 NOTE — Care Management Note (Signed)
Case Management Note  Patient Details  Name: Richard [name] MRN: [id number] Date of Birth: 1931/02/14  Subjective/Objective:     Admitted to [facility] with the diagnosis of subdural hematoma. Discharged from this facility 12/17/18. Lives alone, but has caregivers. Son is [name] [phone]). Sees Dr. Laurann Montana as primary care physician.  Discharged home 12/17/2018 with Unasyn per PICC live. This antibiotic was completed 12/14/18.  Seen by wound care nurse. Amputation of right great toe last admission.                  Action/Plan: Followed by [facility] for skilled nursing and physical therapy.  Will continue to follow for transition of care needs.    Expected Discharge Date:  12/30/18               Expected Discharge Plan:     In-House Referral:   yes  Discharge planning Services   yes  Post Acute Care Choice:    Choice offered to:     DME Arranged:    DME Agency:     HH Arranged:    HH Agency:     Status of Service:     If discussed at [employer], dates discussed:    Additional Comments:  Shelbie Ammons, RN MSN CCM Care Management [phone] 12/30/2018, 10:11 AM

## 2018-12-30 NOTE — Discharge Instructions (Signed)
HHPTOT Fall precaution.

## 2018-12-30 NOTE — Evaluation (Signed)
Physical Therapy Evaluation Patient Details Name: Richard [name] MRN: [id number] DOB: 03-17-1931 Today's Date: 12/30/2018   History of Present Illness  PT is 85 yo male with PMH of recent admit, HTN,PAF,DM, CKDIII, per last podiatry note WBAT in post op shoe on RLE, minimize ambulation. Pt admitted for acute on chronic subdural hematoma.     Clinical Impression  Patient easily woken at start of session, oriented to self but unable to correctly state birthday and year. Aware he is in a hospital, behavior [place], some garbled speech/stuttering noted. Pt able to report PLOF, confirmed with family at end of session. Pt lives alone, has a Barrister's clerk that assist. Pt alone from 7pm to [employer], son reported he lives very close. Needs assists with ADLs, mobility, and ambulation, either HH assist from son, or aide with RW. Son reported he is able to ambulate ~80ft.   Upon assessment patient demonstrated some L sided facial droop, LUE weakness (pt complained of L shoulder pain due to a fall 2.5 months ago), and LLE weakness, sensation intact. PT contacted RN and MD about concerns, nursing in room to assess pt at end of session. Pt mobilized to EOB with minAx1, sit <>stand with modAx2 and take few short, shuffling steps to recliner with RW. PT with difficulty grasping walker with LUE, and TKE of LLE, as well as flexed trunk.  Overall the patient demonstrated deficits (see "PT Problem List") that differ from patient's PLOF and would benefit from further skilled PT to maximize independence, gait, and safety. Recommendation is STR due to current level of assist needed for mobility.       Follow Up Recommendations SNF    Equipment Recommendations  Other (comment)(TBD at next venue of care)    Recommendations for Other Services       Precautions / Restrictions Precautions Precautions: Fall Precaution Comments: post op shoe for mobility, limit ambulation per last podiatry note Restrictions Weight  Bearing Restrictions: Yes RLE Weight Bearing: Weight bearing as tolerated Other Position/Activity Restrictions: through heel      Mobility  Bed Mobility Overal bed mobility: Needs Assistance Bed Mobility: Supine to Sit     Supine to sit: Min assist     General bed mobility comments: for LE management and trunk elevation  Transfers Overall transfer level: Needs assistance   Transfers: Sit to/from Stand Sit to Stand: Mod assist;+2 physical assistance;From elevated surface            Ambulation/Gait Ambulation/Gait assistance: Mod assist;+2 physical assistance Gait Distance (Feet): 2 Feet Assistive device: Rolling walker (2 wheeled)       General Gait Details: Difficulty with upright posture, TKE on LLE, small shuffling steps. Pt anxious with mobility.  Stairs            Wheelchair Mobility    Modified Rankin (Stroke Patients Only)       Balance Overall balance assessment: Needs assistance Sitting-balance support: Feet supported Sitting balance-Leahy Scale: Fair       Standing balance-Leahy Scale: Zero                               Pertinent Vitals/Pain Pain Assessment: Faces Faces Pain Scale: Hurts a little bit Pain Location: headache Pain Descriptors / Indicators: Dull Pain Intervention(s): Limited activity within patient's tolerance;Monitored during session;Repositioned    Home Living Family/patient expects to be discharged to:: Private residence Living Arrangements: Alone Available Help at Discharge: Family;Personal care attendant Type of  Home: House Home Access: Stairs to enter Entrance Stairs-Rails: None Entrance Stairs-Number of Steps: 1 Home Layout: One level Home Equipment: Emily - 2 wheels;Walker - 4 wheels Additional Comments: family reported 1 fall 2.5 months ago    Prior Function Level of Independence: Needs assistance   Gait / Transfers Assistance Needed: Needs assistance from son for transfers, able to  ambulate to bathroom/household distances with caregiver and son with [phone].   ADL's / Homemaking Assistance Needed: dependent for ADLs  Comments: Pt is alone from 7pm to 7am. Son reported living about 19ft away in a different house.     Hand Dominance        Extremity/Trunk Assessment   Upper Extremity Assessment Upper Extremity Assessment: Generalized weakness;RUE deficits/detail;LUE deficits/detail;Defer to OT evaluation RUE Deficits / Details: able to lift RUE overhead, grossly 4-/5 RUE Sensation: WNL LUE Deficits / Details: Unable to lift LUE above 90deg due to pt reported pain. grip strength 3+/5 with extended time, full elbow ROM LUE: Unable to fully assess due to pain LUE Sensation: WNL    Lower Extremity Assessment Lower Extremity Assessment: Generalized weakness;RLE deficits/detail;LLE deficits/detail RLE Deficits / Details: grossly 4+/5 LLE Deficits / Details: 2+/5 for knee extension, hamstring 3+/5, DF 4/5, poor hip flexion noted. LLE Sensation: WNL       Communication   Communication: Other (comment)(garbled speech, stuttering, occasional difficulty finding words)  Cognition Arousal/Alertness: Awake/alert Behavior During Therapy: WFL for tasks assessed/performed Overall Cognitive Status: Within Functional Limits for tasks assessed                                        General Comments      Exercises     Assessment/Plan    PT Assessment Patient needs continued PT services  PT Problem List Decreased strength;Pain;Decreased range of motion;Decreased activity tolerance;Decreased balance;Decreased mobility       PT Treatment Interventions DME instruction;Therapeutic exercise;Gait training;Balance training;Neuromuscular re-education;Functional mobility training;Stair training;Therapeutic activities;Patient/family education    PT Goals (Current goals can be found in the Care Plan section)  Acute Rehab PT Goals Patient Stated Goal: Pt wants  to feel better PT Goal Formulation: With patient Time For Goal Achievement: 01/13/19 Potential to Achieve Goals: Good    Frequency Min 2X/week   Barriers to discharge        Co-evaluation               AM-PAC PT "6 Clicks" Mobility  Outcome Measure Help needed turning from your back to your side while in a flat bed without using bedrails?: A Little Help needed moving from lying on your back to sitting on the side of a flat bed without using bedrails?: A Lot Help needed moving to and from a bed to a chair (including a wheelchair)?: A Lot Help needed standing up from a chair using your arms (e.g., wheelchair or bedside chair)?: A Lot Help needed to walk in hospital room?: A Lot Help needed climbing 3-5 steps with a railing? : Total 6 Click Score: 12    End of Session Equipment Utilized During Treatment: Gait belt Activity Tolerance: Patient limited by fatigue Patient left: with chair alarm set;in chair;with nursing/sitter in room;with call bell/phone within reach Nurse Communication: Mobility status;Other (comment)(LE and UE weakness, complaints of headache, speech difficulties) PT Visit Diagnosis: Unsteadiness on feet (R26.81);Difficulty in walking, not elsewhere classified (R26.2);Muscle weakness (generalized) (M62.81);History of falling (Z91.81)  Time: 2841-3244 PT Time Calculation (min) (ACUTE ONLY): 27 min   Charges:   PT Evaluation $PT Eval Moderate Complexity: 1 Mod PT Treatments $Therapeutic Activity: 8-22 mins        Lieutenant Diego PT, DPT 1:35 PM,12/30/18 [phone]

## 2018-12-30 NOTE — Progress Notes (Signed)
Foley d/cd.  500 ml urine in bag. Pt self caths usually q 6 hrs as needed.

## 2018-12-30 NOTE — Plan of Care (Signed)
  Problem: Education: Goal: Knowledge of disease or condition will improve Outcome: Progressing Goal: Knowledge of secondary prevention will improve Outcome: Progressing Goal: Knowledge of patient specific risk factors addressed and post discharge goals established will improve Outcome: Progressing Goal: Individualized Educational Video(s) Outcome: Progressing   Problem: Nutrition: Goal: Risk of aspiration will decrease Outcome: Progressing Goal: Dietary intake will improve Outcome: Progressing   Problem: Intracerebral Hemorrhage Tissue Perfusion: Goal: Complications of Intracerebral Hemorrhage will be minimized Outcome: Progressing  Repeat ct scan this pm

## 2018-12-31 ENCOUNTER — Inpatient Hospital Stay: Payer: Medicare Other

## 2018-12-31 LAB — URINALYSIS, COMPLETE (UACMP) WITH MICROSCOPIC
Bilirubin Urine: NEGATIVE
Glucose, UA: 50 mg/dL — AB
Hgb urine dipstick: NEGATIVE
Ketones, ur: NEGATIVE mg/dL
Nitrite: NEGATIVE
Protein, ur: >=300 mg/dL — AB
Specific Gravity, Urine: 1.015 (ref 1.005–1.030)
pH: 8 (ref 5.0–8.0)

## 2018-12-31 LAB — GLUCOSE, CAPILLARY
Glucose-Capillary: 141 mg/dL — ABNORMAL HIGH (ref 70–99)
Glucose-Capillary: 170 mg/dL — ABNORMAL HIGH (ref 70–99)
Glucose-Capillary: 121 mg/dL — ABNORMAL HIGH (ref 70–99)
Glucose-Capillary: 157 mg/dL — ABNORMAL HIGH (ref 70–99)

## 2018-12-31 NOTE — Care Management Note (Addendum)
Case Management Note  Patient Details  Name: JAYMESON MENGEL MRN: [id number] Date of Birth: [birth year]  Subjective/Objective:  PT recommending SNF. Patient lives at home with son and currently refuses SNF. Patient has private caregivers as well as home health via [facility]. Patient prefers not to d/c today his preference is to go home with resumption of services soon. Per patient he has DME rolling walker, bedside commode and tells me "he has everything." Patient has dressing changes to right foot. Has private caregivers that cover 40 hours a week and patients son lives next door and checks in on patient often.                   Action/Plan: RNCM to continue to follow for any needs.   Expected Discharge Date:  12/30/18               Expected Discharge Plan:     In-House Referral:     Discharge planning Services     Post Acute Care Choice:    Choice offered to:     DME Arranged:    DME Agency:     HH Arranged:    HH Agency:     Status of Service:     If discussed at [employer], dates discussed:    Additional Comments:  Latanya Maudlin, RN 12/31/2018, 4:16 PM

## 2018-12-31 NOTE — Care Management Important Message (Signed)
Important Message  Patient Details  Name: Richard [name] MRN: [id number] Date of Birth: [birth year]   Medicare Important Message Given:  Yes    [name] 12/31/2018, 12:48 PM

## 2018-12-31 NOTE — Clinical Social Work Note (Signed)
Clinical Social Work Assessment  Patient Details  Name: Richard [name] MRN: [id number] Date of Birth: 1931-05-04  Date of referral:  12/31/18               Reason for consult:  Facility Placement                Permission sought to share information with:  Case Manager, Customer service manager, Family Supports Permission granted to share information::  Yes, Verbal Permission Granted  Name::        Agency::     Relationship::     Contact Information:     Housing/Transportation Living arrangements for the past 2 months:  Single Family Home Source of Information:  Patient Patient Interpreter Needed:  None Criminal Activity/Legal Involvement Pertinent to Current Situation/Hospitalization:  No - Comment as needed Significant Relationships:  Adult Children Lives with:  Self Do you feel safe going back to the place where you live?  Yes Need for family participation in patient care:  Yes (Comment)  Care giving concerns:  Patient lives alone and has caregivers during the day    Social Worker assessment / plan:  CSW consulted for SNF placement. CSW met with patient to discuss discharge plan. CSW introduced self and explained role. Patient is alert and oriented x4. Patient reports that he lives alone and has caregivers during the day. Patient also states that his son helps him as well. Patient states that he is not ready to leave the hospital. CSW explained that discharge would be determined by the MD. CSW explained PT recommendation of SNF. Patient is adamantly refusing SNF. Patient reports that his wife was a Marine scientist and he has been to every facility in [place] and will never be placed in a facility. CSW notified RNCM of need for home health. CSW signing off. Please re consult if further needs arise.    Employment status:  Retired Forensic scientist:  Medicare PT Recommendations:  [place] / Referral to community resources:  [place]  Patient/Family's Response to care:  Patient thanked CSW for assistance   Patient/Family's Understanding of and Emotional Response to Diagnosis, Current Treatment, and Prognosis:  Patient understands current diagnosis and treatment.   Emotional Assessment Appearance:  Appears stated age Attitude/Demeanor/Rapport:  Guarded Affect (typically observed):  Restless, Frustrated, Withdrawn Orientation:  Oriented to Self, Oriented to Place, Oriented to  Time, Oriented to Situation Alcohol / Substance use:  Not Applicable Psych involvement (Current and /or in the community):  No (Comment)  Discharge Needs  Concerns to be addressed:  Discharge Planning Concerns Readmission within the last 30 days:  Yes Current discharge risk:  Cognitively Impaired, Dependent with Mobility Barriers to Discharge:  Continued Medical Work up   [employer], [place] 12/31/2018, 8:57 AM

## 2018-12-31 NOTE — Progress Notes (Signed)
[facility] NAME: Richard [name]    MR#:  [id number]  DATE OF BIRTH:  1931/10/17  SUBJECTIVE:  CHIEF COMPLAINT:   Chief Complaint  Patient presents with  . Headache   -Has left shoulder pain due to worsening arthritis. -Occasional slurred speech according to patient.  Speech is clear at this time  REVIEW OF SYSTEMS:  Review of Systems  Constitutional: Negative for chills, fever and malaise/fatigue.  HENT: Negative for congestion, ear discharge, hearing loss and nosebleeds.   Eyes: Negative for blurred vision and double vision.  Respiratory: Negative for cough, shortness of breath and wheezing.   Cardiovascular: Negative for chest pain and palpitations.  Gastrointestinal: Negative for abdominal pain, constipation, diarrhea, nausea and vomiting.  Genitourinary: Negative for dysuria.  Musculoskeletal: Positive for joint pain. Negative for myalgias.  Neurological: Positive for speech change and focal weakness. Negative for dizziness, seizures and headaches.  Psychiatric/Behavioral: Negative for depression.    DRUG ALLERGIES:  No Known Allergies  VITALS:  Blood pressure 113/64, pulse 60, temperature (!) 97.5 F (36.4 C), temperature source Oral, resp. rate (!) 24, height 6' (1.829 m), weight 89 kg, SpO2 98 %.  PHYSICAL EXAMINATION:  Physical Exam   GENERAL:  85 y.o.-year-old elderly patient lying in the bed with no acute distress.  EYES: Pupils equal, round, reactive to light and accommodation. No scleral icterus. Extraocular muscles intact.  HEENT: Head atraumatic, normocephalic. Oropharynx and nasopharynx clear.  NECK:  Supple, no jugular venous distention. No thyroid enlargement, no tenderness.  LUNGS: Normal breath sounds bilaterally, no wheezing, rales,rhonchi or crepitation. No use of accessory muscles of respiration.  Decreased breath sounds at the bases CARDIOVASCULAR: S1, S2 normal. No  rubs, or gallops.  3/6  systolic murmur is present ABDOMEN: Soft, nontender, nondistended. Bowel sounds present. No organomegaly or mass.  EXTREMITIES: No pedal edema, cyanosis, or clubbing.  Right foot is in a postop boot due to recent surgery. NEUROLOGIC: Cranial nerves II through XII are intact.  No slurred speech during my exam.  However after a long time occasionally there is some slurring and word finding difficulty which clears after a minute of rest. Muscle strength 5/5 in all extremities except left upper extremity as the shoulder movement is limited due to pain.. Sensation intact. Gait not checked.  PSYCHIATRIC: The patient is alert and oriented x 3.  Intermittent confusion noted SKIN: No obvious rash, lesion, or ulcer.    LABORATORY PANEL:   CBC Recent Labs  Lab 12/29/18 0232  WBC 11.1*  HGB 13.7  HCT 41.4  PLT 189   ------------------------------------------------------------------------------------------------------------------  Chemistries  Recent Labs  Lab 12/28/18 1833 12/29/18 0232  NA 135 134*  K 4.7 4.4  CL 103 105  CO2 22 20*  GLUCOSE 184* 188*  BUN 37* 38*  CREATININE 1.67* 1.59*  CALCIUM 9.0 8.5*  AST 23  --   ALT 14  --   ALKPHOS 121  --   BILITOT 0.7  --    ------------------------------------------------------------------------------------------------------------------  Cardiac Enzymes No results for input(s): TROPONINI in the last 168 hours. ------------------------------------------------------------------------------------------------------------------  RADIOLOGY:  Ct Head Wo Contrast  Result Date: 12/30/2018 CLINICAL DATA:  Follow-up RIGHT convexity subdural hematoma. EXAM: CT HEAD WITHOUT CONTRAST TECHNIQUE: Contiguous axial images were obtained from the base of the skull through the vertex without intravenous contrast. COMPARISON:  12/29/2018, 12/28/2018. FINDINGS: Brain: The mixed attenuation acute superimposed upon chronic RIGHT convexity subdural hematoma is  unchanged since yesterday. The high  attenuation acute component measures approximately 7 mm in maximum thickness (approximately 8 mm yesterday). The chronic low-attenuation component measures approximately 10 mm in thickness (approximately 10 mm yesterday as well). Measurements were obtained on the coronal reformatted images. Associated mild mass effect on the adjacent cortical sulci, unchanged. No midline shift. No new abnormalities elsewhere. Vascular: Moderate BILATERAL carotid siphon atherosclerosis. Mild vertebrobasilar atherosclerosis with dolichoectasia of the basilar artery. No hyperdense vessel. Skull: No skull fracture or other focal osseous abnormality involving the skull. Sinuses/Orbits: Visualized paranasal sinuses, bilateral mastoid air cells and bilateral middle ear cavities well-aerated. Benign calcified scleral plaques in both eyes. Other: None. IMPRESSION: 1. Stable acute superimposed upon chronic RIGHT convexity subdural hematoma since yesterday. 2. Stable mild mass effect on the adjacent cortical sulci without midline shift. 3. No new abnormalities. Electronically Signed   By: Evangeline Dakin M.D.   On: 12/30/2018 14:12   Dg Shoulder Left  Result Date: 12/30/2018 CLINICAL DATA:  Left shoulder pain.  No known injury. EXAM: LEFT SHOULDER - 2+ VIEW COMPARISON:  None. FINDINGS: There is no acute bony or joint abnormality. Mild to moderate acromioclavicular and mild glenohumeral osteoarthritis is seen. Soft tissues are unremarkable. IMPRESSION: No acute abnormality. Mild to moderate acromioclavicular and mild glenohumeral osteoarthritis. Electronically Signed   By: Inge Rise M.D.   On: 12/30/2018 08:51    EKG:   Orders placed or performed during the hospital encounter of 11/28/18  . ED EKG 12-Lead  . ED EKG 12-Lead  . EKG 12-Lead  . EKG 12-Lead  . EKG    ASSESSMENT AND PLAN:   85 year old male with past medical history significant for A. 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  1.  Subdural hematoma-spontaneous right-sided subdural hematoma.  Patient denies any trauma -He is on Coumadin which is discontinued at this time.  Appreciate neurosurgery consult. -Recommended holding anticoagulation for 2 weeks and following up in the office. -Patient concerned about the slurred speech and some left arm weakness. -Repeat CT showing stable hematoma.  Patient and family very concerned about his slurred speech which could be pressure effect from the hematoma.  We will get an MRI of the brain.  Even if it shows any strokes, we cannot do any anticoagulation due to the subdural hematoma.  Explained to family.  2.  Left shoulder pain-has known history of arthritis, x-ray confirms significant degenerative disease and arthritis. -Has received corticosteroid shots in the past.  Might need to follow-up with Ortho as outpatient.  3.  Hypertension-continue lisinopril and metoprolol low-dose.  4.  Diabetes mellitus-on sliding scale insulin here.  5.  DVT prophylaxis-teds and SCDs  -Continue to ambulate with physical therapy.  They have recommended rehab at this time     All the records are reviewed and case discussed with Care Management/Social Workerr. Management plans discussed with the patient, family and they are in agreement.  CODE STATUS: DNR  TOTAL TIME TAKING CARE OF THIS PATIENT: 38 minutes.   POSSIBLE D/C IN 2 DAYS, DEPENDING ON CLINICAL CONDITION.   Gladstone Lighter M.D on 12/31/2018 at 1:38 PM  Between 7am to 6pm - Pager - [phone]  After 6pm go to [email or web address] - password [email or web address] [facility]  Office  [phone]  CC: Primary care physician; [name], MD

## 2019-01-01 ENCOUNTER — Inpatient Hospital Stay: Payer: Medicare Other

## 2019-01-01 LAB — BASIC METABOLIC PANEL
Anion gap: 9 (ref 5–15)
BUN: 48 mg/dL — ABNORMAL HIGH (ref 8–23)
CO2: 23 mmol/L (ref 22–32)
Calcium: 8.8 mg/dL — ABNORMAL LOW (ref 8.9–10.3)
Chloride: 105 mmol/L (ref 98–111)
Creatinine, Ser: 2.14 mg/dL — ABNORMAL HIGH (ref 0.61–1.24)
GFR calc Af Amer: 32 mL/min — ABNORMAL LOW (ref >60)
GFR calc non Af Amer: 27 mL/min — ABNORMAL LOW (ref >60)
Glucose, Bld: 169 mg/dL — ABNORMAL HIGH (ref 70–99)
Potassium: 4.3 mmol/L (ref 3.5–5.1)
Sodium: 137 mmol/L (ref 135–145)

## 2019-01-01 LAB — CBC
HCT: 42.6 % (ref 39.0–52.0)
Hemoglobin: 13.6 g/dL (ref 13.0–17.0)
MCH: 30.6 pg (ref 26.0–34.0)
MCHC: 31.9 g/dL (ref 30.0–36.0)
MCV: 95.9 fL (ref 80.0–100.0)
Platelets: 150 10*3/uL (ref 150–400)
RBC: 4.44 MIL/uL (ref 4.22–5.81)
RDW: 14.1 % (ref 11.5–15.5)
WBC: 9.4 10*3/uL (ref 4.0–10.5)
nRBC: 0 % (ref 0.0–0.2)

## 2019-01-01 LAB — GLUCOSE, CAPILLARY
Glucose-Capillary: 145 mg/dL — ABNORMAL HIGH (ref 70–99)
Glucose-Capillary: 195 mg/dL — ABNORMAL HIGH (ref 70–99)
Glucose-Capillary: 154 mg/dL — ABNORMAL HIGH (ref 70–99)
Glucose-Capillary: 138 mg/dL — ABNORMAL HIGH (ref 70–99)

## 2019-01-01 MED ORDER — SODIUM CHLORIDE 0.9 % IV SOLN
INTRAVENOUS | Status: DC
  Administered 2019-01-01 – 2019-01-02 (×2): via INTRAVENOUS

## 2019-01-01 NOTE — Progress Notes (Signed)
[facility] NAME: Richard [name]    MR#:  [id number]  DATE OF BIRTH:  [birth year]  SUBJECTIVE:  CHIEF COMPLAINT:   Chief Complaint  Patient presents with  . Headache   - does not want to go to rehab, frustrated about all the things in the hospital - still has some confusion and occasional slurring of speech  REVIEW OF SYSTEMS:  Review of Systems  Constitutional: Negative for chills, fever and malaise/fatigue.  HENT: Negative for congestion, ear discharge, hearing loss and nosebleeds.   Eyes: Negative for blurred vision and double vision.  Respiratory: Negative for cough, shortness of breath and wheezing.   Cardiovascular: Negative for chest pain and palpitations.  Gastrointestinal: Negative for abdominal pain, constipation, diarrhea, nausea and vomiting.  Genitourinary: Negative for dysuria.  Musculoskeletal: Positive for joint pain. Negative for myalgias.  Neurological: Positive for speech change and focal weakness. Negative for dizziness, seizures and headaches.  Psychiatric/Behavioral: Negative for depression.    DRUG ALLERGIES:  No Known Allergies  VITALS:  Blood pressure 126/68, pulse 61, temperature (!) 97.5 F (36.4 C), temperature source Oral, resp. rate 15, height 6' (1.829 m), weight 89 kg, SpO2 100 %.  PHYSICAL EXAMINATION:  Physical Exam   GENERAL:  85 y.o.-year-old elderly patient lying in the bed with no acute distress.  EYES: Pupils equal, round, reactive to light and accommodation. No scleral icterus. Extraocular muscles intact.  HEENT: Head atraumatic, normocephalic. Oropharynx and nasopharynx clear.  NECK:  Supple, no jugular venous distention. No thyroid enlargement, no tenderness.  LUNGS: Normal breath sounds bilaterally, no wheezing, rales,rhonchi or crepitation. No use of accessory muscles of respiration.  Decreased breath sounds at the bases CARDIOVASCULAR: S1, S2 normal. No  rubs, or gallops.   3/6 systolic murmur is present ABDOMEN: Soft, nontender, nondistended. Bowel sounds present. No organomegaly or mass.  EXTREMITIES: No pedal edema, cyanosis, or clubbing.  Right foot is in a postop boot due to recent surgery. NEUROLOGIC: Cranial nerves II through XII are intact.  No slurred speech during my exam.  However after a long time occasionally there is some slurring and word finding difficulty which clears after a minute of rest. Muscle strength 5/5 in all extremities except left upper extremity as the shoulder movement is limited due to pain.. Sensation intact. Gait not checked.  PSYCHIATRIC: The patient is alert and oriented x 3.  Intermittent confusion noted SKIN: No obvious rash, lesion, or ulcer.    LABORATORY PANEL:   CBC Recent Labs  Lab 01/01/19 0456  WBC 9.4  HGB 13.6  HCT 42.6  PLT 150   ------------------------------------------------------------------------------------------------------------------  Chemistries  Recent Labs  Lab 12/28/18 1833  01/01/19 0456  NA 135   < > 137  K 4.7   < > 4.3  CL 103   < > 105  CO2 22   < > 23  GLUCOSE 184*   < > 169*  BUN 37*   < > 48*  CREATININE 1.67*   < > 2.14*  CALCIUM 9.0   < > 8.8*  AST 23  --   --   ALT 14  --   --   ALKPHOS 121  --   --   BILITOT 0.7  --   --    < > = values in this interval not displayed.   ------------------------------------------------------------------------------------------------------------------  Cardiac Enzymes No results for input(s): TROPONINI in the last 168 hours. ------------------------------------------------------------------------------------------------------------------  RADIOLOGY:  Mr Brain 15  Contrast  Result Date: 12/31/2018 CLINICAL DATA:  Subdural hematoma.  Intermittent slurred speech. EXAM: MRI HEAD WITHOUT CONTRAST TECHNIQUE: Multiplanar, multiecho pulse sequences of the brain and surrounding structures were obtained without intravenous contrast. COMPARISON:   Head CT 12/30/2018 FINDINGS: Brain: There is a 3 mm focus of slightly restricted diffusion in the posterior right frontal lobe white matter at the level of the centrum semiovale. There is also a 3 mm focus of abnormal diffusion signal involving parasagittal right parietal cortex. A complex subdural hematoma over the right cerebral convexity has not significantly changed in size from the prior CT allowing for differences in modality with maximal thickness of 13 mm in the parietal region. There are both T1 hypointense and T1 hyperintense blood products. Associated mild mass effect is noted on the underlying brain parenchyma without midline shift. There is mild cerebral atrophy. Small foci of T2 hyperintensity in the cerebral white matter bilaterally are nonspecific but compatible with mild chronic small vessel ischemic disease. Vascular: Segmental flow void loss in the distal vertebral arteries and carotid siphons bilaterally may be related to technique rather than reflecting diminished flow. Skull and upper cervical spine: Unremarkable bone marrow signal. Sinuses/Orbits: Bilateral cataract extraction. Small mastoid effusions. Clear paranasal sinuses. Other: 6 cm lipoma in the subcutaneous tissues of the suboccipital region. IMPRESSION: 1. 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 2. Unchanged subdural hematoma over the right cerebral convexity. No midline shift. 3. Mild chronic small vessel ischemic disease. Electronically Signed   By: Logan Bores M.D.   On: 12/31/2018 15:25   US Carotid Bilateral  Result Date: 01/01/2019 CLINICAL DATA:  Small punctate right frontal and parietal infarcts EXAM: BILATERAL CAROTID DUPLEX ULTRASOUND TECHNIQUE: Pearline Cables scale imaging, color Doppler and duplex ultrasound were performed of bilateral carotid and vertebral arteries in the neck. COMPARISON:  12/31/2018 FINDINGS: Criteria: Quantification of carotid stenosis is based on velocity  parameters that correlate the residual internal carotid diameter with NASCET-based stenosis levels, using the diameter of the distal internal carotid lumen as the denominator for stenosis measurement. The following velocity measurements were obtained: RIGHT ICA: 127/13 cm/sec CCA: 78/2 cm/sec SYSTOLIC ICA/CCA RATIO:  1.5 ECA: 180 cm/sec LEFT ICA: 115/11 cm/sec CCA: 956/2 cm/sec SYSTOLIC ICA/CCA RATIO:  0.8 ECA: 122 cm/sec RIGHT CAROTID ARTERY: Mild-to-moderate mixed echogenicity plaque formation. Despite this, no hemodynamically significant right ICA stenosis, velocity elevation, or turbulent flow. Degree of narrowing less than 50%. RIGHT VERTEBRAL ARTERY:  Antegrade LEFT CAROTID ARTERY: Similar mild-to-moderate mixed echogenicity shadowing plaque formation. Despite this, no hemodynamically significant left ICA stenosis, velocity elevation, or turbulent flow. LEFT VERTEBRAL ARTERY:  Antegrade IMPRESSION: Moderate bilateral carotid atherosclerosis. No hemodynamically significant ICA stenosis by ultrasound. Degree of narrowing estimated less than 50% bilaterally by ultrasound criteria. Patent antegrade vertebral flow bilaterally Electronically Signed   By: Jerilynn Mages.  Shick M.D.   On: 01/01/2019 12:39    EKG:   Orders placed or performed during the hospital encounter of 11/28/18  . ED EKG 12-Lead  . ED EKG 12-Lead  . EKG 12-Lead  . EKG 12-Lead  . EKG    ASSESSMENT AND PLAN:   85 year old male with past medical history significant for A. 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  1.  Subdural hematoma-spontaneous right-sided subdural hematoma.  Patient denies any trauma -He is on Coumadin which is discontinued at this  time.  Appreciate neurosurgery consult. -Recommended holding anticoagulation for 2 weeks and following up in the office. -Patient  concerned about the slurred speech and some left arm weakness. -Repeat CT showing stable hematoma.   - MRI of brain showing new infarcts- however will hold off on anticoagulation due to the acute sub dural hematoma for 2 weeks - Appreciate neurology input - carotid dopplers with no hemodynamically significant stenosis.  - will try to reach out to neuro surgery bout empiric anti epileptics given large sub dural hematoma and patients speech change etc which could be sub clinical seizure per neurology No use for EEG at this time.  2.  Left shoulder pain-has known history of arthritis, x-ray confirms significant degenerative disease and arthritis. -Has received corticosteroid shots in the past.  Might need to follow-up with Ortho as outpatient.  3.  Hypertension-continue lisinopril and metoprolol low-dose.  4.  Diabetes mellitus-on sliding scale insulin here.  5.  DVT prophylaxis-teds and SCDs  6. ARF- dehydration, IV fluids today  -Continue to ambulate with physical therapy.  They have recommended rehab at this time- patient very weak- but adamant about not going to rehab Will try to reach out to son again today to discuss disposition plans.     All the records are reviewed and case discussed with Care Management/Social Workerr. Management plans discussed with the patient, family and they are in agreement.  CODE STATUS: DNR  TOTAL TIME TAKING CARE OF THIS PATIENT: 38 minutes.   POSSIBLE D/C IN 2 DAYS, DEPENDING ON CLINICAL CONDITION.   Gladstone Lighter M.D on 01/01/2019 at 2:48 PM  Between 7am to 6pm - Pager - [phone]  After 6pm go to [email or web address] - password [email or web address] [facility]  Office  [phone]  CC: Primary care physician; [name], MD

## 2019-01-01 NOTE — Progress Notes (Signed)
Physical Therapy Treatment Patient Details Name: Richard [name] MRN: [id number] DOB: 1931/04/04 Today's Date: 01/01/2019    History of Present Illness PT is 85 yo male with PMH of recent admit, HTN,PAF,DM, CKDIII, per last podiatry note WBAT in post op shoe on RLE, minimize ambulation. Pt admitted for acute on chronic subdural hematoma.     PT Comments    Patient easily woken, agreeable to attempt to participate with PT.  Patient mobilized to EOB with maxAx1, moderate verbal cues and assistance needed to scoot to EOB. Pt was able to sit EOB with UE support for several minutes, CGA-modAx2 to maintain balance, pt needed more support with fatigue. Sit <> stand with RW and maxAx2, pt unable to stand despite multimodal cues and maximum support. Pt fatigued, began to actively try to lay down, further mobility held, repositioned in bed with MaxAx2. The patient would benefit from further skilled PT to continue to maximize mobility and independence.     Follow Up Recommendations  SNF     Equipment Recommendations  Other (comment)(TBD at next venue of care)    Recommendations for Other Services       Precautions / Restrictions Precautions Precautions: Fall Precaution Comments: post op shoe for mobility, limit ambulation per last podiatry note Restrictions Weight Bearing Restrictions: Yes RLE Weight Bearing: Weight bearing as tolerated Other Position/Activity Restrictions: through heel    Mobility  Bed Mobility Overal bed mobility: Needs Assistance Bed Mobility: Sit to Supine     Supine to sit: Max assist Sit to supine: Max assist;+2 for physical assistance      Transfers Overall transfer level: Needs assistance Equipment used: Rolling walker (2 wheeled) Transfers: Sit to/from Stand Sit to Stand: +2 physical assistance;Max assist         General transfer comment: Pt unable to stand with maxAx2 and RW. Complained of fatigue after one trial, actively trying to lay down.  Further attempts held  Ambulation/Gait                 Stairs             Wheelchair Mobility    Modified Rankin (Stroke Patients Only)       Balance Overall balance assessment: Needs assistance Sitting-balance support: Feet supported;Single extremity supported Sitting balance-Leahy Scale: Poor       Standing balance-Leahy Scale: Zero                              Cognition Arousal/Alertness: Lethargic Behavior During Therapy: WFL for tasks assessed/performed Overall Cognitive Status: History of cognitive impairments - at baseline                                 General Comments: Pt with increased lethargy this session, delayed answering, appearing fatigued. Cues to intermittent keep eyes open.      Exercises Other Exercises Other Exercises: Patient cued intermittently for upright posture to sit EOB for several minutes    General Comments        Pertinent Vitals/Pain Pain Assessment: Faces Faces Pain Scale: Hurts a little bit Pain Location: headache, shoulder pain.  Pain Descriptors / Indicators: Grimacing;Guarding Pain Intervention(s): Limited activity within patient's tolerance;Monitored during session;Repositioned    Home Living                      Prior Function  PT Goals (current goals can now be found in the care plan section) Progress towards PT goals: Progressing toward goals    Frequency    Min 2X/week      PT Plan Current plan remains appropriate    Co-evaluation              AM-PAC PT "6 Clicks" Mobility   Outcome Measure  Help needed turning from your back to your side while in a flat bed without using bedrails?: A Little Help needed moving from lying on your back to sitting on the side of a flat bed without using bedrails?: A Lot Help needed moving to and from a bed to a chair (including a wheelchair)?: Total Help needed standing up from a chair using your arms  (e.g., wheelchair or bedside chair)?: Total Help needed to walk in hospital room?: Total Help needed climbing 3-5 steps with a railing? : Total 6 Click Score: 9    End of Session Equipment Utilized During Treatment: Gait belt Activity Tolerance: Patient limited by fatigue Patient left: in bed;with bed alarm set;with call bell/phone within reach Nurse Communication: Mobility status PT Visit Diagnosis: Unsteadiness on feet (R26.81);Difficulty in walking, not elsewhere classified (R26.2);Muscle weakness (generalized) (M62.81);History of falling (Z91.81)     Time: 7530-0511 PT Time Calculation (min) (ACUTE ONLY): 32 min  Charges:  $Therapeutic Exercise: 8-22 mins $Therapeutic Activity: 8-22 mins                     Lieutenant Diego PT, DPT 12:22 PM,01/01/19 [phone]

## 2019-01-01 NOTE — Consult Note (Addendum)
Referring Physician: Dr. Tressia Miners    Reason for Consult: R frontal infarct   HPI: Richard [name] is an 85 y.o. male with past medical history significant for atrial fibrillation on Coumadin, toe amputation, peripheral vascular disease who presented to the hospital on January 13 for headaches.  In the ED patient got a CT scan of the brain which shows right subdural hematoma, seem like it was spontaneous, patient denied any fall or head trauma, neurosurgery were consulted and recommended no intervention, patient was given vitamin K in the emergency room to reverse his atrial fibrillation, Coumadin was held.  Hospital course was insignificant, patient continued to complain about left shoulder pain, MRI of the brain was ordered and showed subacute right frontal infarct, neurology consult was obtained to make recommendation in that regard.  Patient also has been complaining about episode of slurred speech, without a change in mental status or loss of consciousness, patient denied any chest pain, no shortness of breath, no focal motor weakness, no visual changes, no fevers.   tPA Given: no due unclear last normal / SDH   Past Medical History Past Medical History:  Diagnosis Date  . Atrial fibrillation ([place])   . Cancer ([place])   . Chronic kidney disease   . Colon polyp   . Diabetes mellitus   . History of blood clotting disorder   . HTN (hypertension)     Surgical History Past Surgical History:  Procedure Laterality Date  . AMPUTATION TOE Right 11/29/2018   Procedure: AMPUTATION TOE;  Surgeon: Albertine Patricia, DPM;  Location: [facility];  Service: Podiatry;  Laterality: Right;  . EYE SURGERY     CATARACTS  . HERNIA REPAIR    . LOWER EXTREMITY ANGIOGRAPHY Right 12/02/2018   Procedure: Lower Extremity Angiography;  Surgeon: Algernon Huxley, MD;  Location: [place] [facility];  Service: Cardiovascular;  Laterality: Right;  . SKIN CANCER EXCISION      Family History  History reviewed. No  pertinent family history.  Social History:   reports that he has never smoked. He has never used smokeless tobacco. He reports that he does not drink alcohol or use drugs.  Allergies:  No Known Allergies  Home Medications:  Medications Prior to Admission  Medication Sig Dispense Refill  . Alpha-Lipoic Acid 200 MG CAPS Take 1 capsule by mouth daily.     Marland Kitchen ALPRAZolam (XANAX) 0.25 MG tablet Take 0.25 mg by mouth at bedtime as needed.      Marland Kitchen atorvastatin (LIPITOR) 10 MG tablet Take 10 mg by mouth daily.      . Calcium Carbonate (CALCIUM 600 PO) Take 1 tablet by mouth daily.     . Cholecalciferol (VITAMIN D) 2000 UNITS tablet Take 2,000 Units by mouth daily.      Marland Kitchen CIALIS 5 MG tablet TK 1/2 T PO QD  11  . Coenzyme Q10 10 MG capsule Take 10 mg by mouth.    . colchicine 0.6 MG tablet Take 0.6 mg by mouth.    . Febuxostat (ULORIC) 80 MG TABS Take 1 tablet by mouth daily.     Marland Kitchen glimepiride (AMARYL) 4 MG tablet Take 4 mg by mouth daily with breakfast.   11  . ibandronate (BONIVA) 150 MG tablet Take 150 mg by mouth every 30 (thirty) days. Take in the morning with a full glass of water, on an empty stomach, and do not take anything else by mouth or lie down for the next 30 min.     Marland Kitchen  ibuprofen (ADVIL,MOTRIN) 400 MG tablet Take 200-400 mg by mouth every 6 (six) hours as needed for mild pain.    Marland Kitchen lisinopril (PRINIVIL,ZESTRIL) 5 MG tablet Take 5 mg by mouth daily.      . Multiple Vitamins-Minerals (MULTIVITAMIN ADULT EXTRA C PO) Take 1 tablet by mouth daily.     Marland Kitchen nystatin-triamcinolone (MYCOLOG II) cream Apply to affected areas bid    . Red Yeast Rice 600 MG TABS Take 1 tablet by mouth daily.     . tadalafil (CIALIS) 5 MG tablet Take 5 mg by mouth every other day.     . Vitamins A & D (VITAMIN A & D) 93570[address]79 UNITS TABS Take 1 tablet by mouth daily.     Marland Kitchen warfarin (COUMADIN) 5 MG tablet Take 5 mg by mouth every Tuesday, Wednesday, Thursday, Saturday, and Sunday at 6 PM. All days except MONDAYS and  FRIDAYS.    Marland Kitchen warfarin (COUMADIN) 7.5 MG tablet Take 7.5 mg by mouth 2 (two) times a week. On MONDAYS and [facility] Medications . atorvastatin  10 mg Oral Daily  . insulin aspart  0-15 Units Subcutaneous TID WC  . insulin aspart  0-5 Units Subcutaneous QHS  . lisinopril  5 mg Oral Daily  . metoprolol tartrate  12.5 mg Oral BID    ROS: Negative except for what is reported in the HPI  Physical Examination:  Vitals:   12/31/18 1603 12/31/18 1946 01/01/19 0455 01/01/19 0802  BP: 112/66 100/69 116/64 126/68  Pulse: 65 79 64 61  Resp:  16 18 15   Temp: (!) 97.3 F (36.3 C) (!) 97.5 F (36.4 C) (!) 97.3 F (36.3 C) (!) 97.5 F (36.4 C)  TempSrc: Oral Oral Axillary Oral  SpO2: 100%  96% 100%  Weight:      Height:        General NAD  Heart - Irregular,  no murmer appreciated Lungs - Clear to auscultation  Abdomen - Soft - non tender Skin - Warm and dry   Neurologic Examination:   Mental Status: Alert, oriented to self, place and month  Speech fluent without evidence of aphasia. Able to follow 3 step commands without difficulty. Cranial Nerves: II: Discs not visualized; Visual fields grossly normal, pupils equal, round, reactive to light III,IV, VI: ptosis not present, extra-ocular motions intact bilaterally V,VII: smile symmetric, facial light touch sensation normal bilaterally VIII: hearing normal bilaterally XII: midline tongue extension  Motor: RUE - 5/5    LUE - 3/5 limited by shoulder pain with increased muscle tone   RLE - 5/5    LLE - 4/5  Sensory: Light touch intact throughout, bilaterally l heel-to-shin test    NIHSS  6   LABORATORY STUDIES:  Basic Metabolic Panel: Recent Labs  Lab 12/28/18 1833 12/29/18 0232 01/01/19 0456  NA 135 134* 137  K 4.7 4.4 4.3  CL 103 105 105  CO2 22 20* 23  GLUCOSE 184* 188* 169*  BUN 37* 38* 48*  CREATININE 1.67* 1.59* 2.14*  CALCIUM 9.0 8.5* 8.8*    Liver Function Tests: Recent Labs  Lab  12/28/18 1833  AST 23  ALT 14  ALKPHOS 121  BILITOT 0.7  PROT 7.6  ALBUMIN 3.3*   No results for input(s): LIPASE, AMYLASE in the last 168 hours. No results for input(s): AMMONIA in the last 168 hours.  CBC: Recent Labs  Lab 12/28/18 1833 12/29/18 0232 01/01/19 0456  WBC 12.4* 11.1* 9.4  NEUTROABS  7.2  --   --   HGB 15.4 13.7 13.6  HCT 47.1 41.4 42.6  MCV 93.8 92.2 95.9  PLT 200 189 150    Cardiac Enzymes: No results for input(s): CKTOTAL, CKMB, CKMBINDEX, TROPONINI in the last 168 hours.  BNP: Invalid input(s): POCBNP  CBG: Recent Labs  Lab 12/31/18 0807 12/31/18 1148 12/31/18 1649 12/31/18 2124 01/01/19 0731  GLUCAP 141* 170* 121* 157* 145*    Microbiology:   Coagulation Studies: No results for input(s): LABPROT, INR in the last 72 hours.  Urinalysis:  Recent Labs  Lab 12/31/18 1509  COLORURINE YELLOW*  LABSPEC 1.015  PHURINE 8.0  GLUCOSEU 50*  HGBUR NEGATIVE  BILIRUBINUR NEGATIVE  KETONESUR NEGATIVE  PROTEINUR >=300*  NITRITE NEGATIVE  LEUKOCYTESUR SMALL*    Lipid Panel:  No results found for: CHOL, TRIG, HDL, CHOLHDL, VLDL, LDLCALC  HgbA1C:  No results found for: HGBA1C  Urine Drug Screen:  No results found for: LABOPIA, COCAINSCRNUR, LABBENZ, AMPHETMU, THCU, LABBARB   Alcohol Level:  No results for input(s): ETH in the last 168 hours.  Miscellaneous labs:  EKG  EKG   IMAGING: Ct Head Wo Contrast  Result Date: 12/30/2018 CLINICAL DATA:  Follow-up RIGHT convexity subdural hematoma. EXAM: CT HEAD WITHOUT CONTRAST TECHNIQUE: Contiguous axial images were obtained from the base of the skull through the vertex without intravenous contrast. COMPARISON:  12/29/2018, 12/28/2018. FINDINGS: Brain: The mixed attenuation acute superimposed upon chronic RIGHT convexity subdural hematoma is unchanged since yesterday. The high attenuation acute component measures approximately 7 mm in maximum thickness (approximately 8 mm yesterday). The  chronic low-attenuation component measures approximately 10 mm in thickness (approximately 10 mm yesterday as well). Measurements were obtained on the coronal reformatted images. Associated mild mass effect on the adjacent cortical sulci, unchanged. No midline shift. No new abnormalities elsewhere. Vascular: Moderate BILATERAL carotid siphon atherosclerosis. Mild vertebrobasilar atherosclerosis with dolichoectasia of the basilar artery. No hyperdense vessel. Skull: No skull fracture or other focal osseous abnormality involving the skull. Sinuses/Orbits: Visualized paranasal sinuses, bilateral mastoid air cells and bilateral middle ear cavities well-aerated. Benign calcified scleral plaques in both eyes. Other: None. IMPRESSION: 1. Stable acute superimposed upon chronic RIGHT convexity subdural hematoma since yesterday. 2. Stable mild mass effect on the adjacent cortical sulci without midline shift. 3. No new abnormalities. Electronically Signed   By: Evangeline Dakin M.D.   On: 12/30/2018 14:12   Mr Brain Wo Contrast  Result Date: 12/31/2018 CLINICAL DATA:  Subdural hematoma.  Intermittent slurred speech. EXAM: MRI HEAD WITHOUT CONTRAST TECHNIQUE: Multiplanar, multiecho pulse sequences of the brain and surrounding structures were obtained without intravenous contrast. COMPARISON:  Head CT 12/30/2018 FINDINGS: Brain: There is a 3 mm focus of slightly restricted diffusion in the posterior right frontal lobe white matter at the level of the centrum semiovale. There is also a 3 mm focus of abnormal diffusion signal involving parasagittal right parietal cortex. A complex subdural hematoma over the right cerebral convexity has not significantly changed in size from the prior CT allowing for differences in modality with maximal thickness of 13 mm in the parietal region. There are both T1 hypointense and T1 hyperintense blood products. Associated mild mass effect is noted on the underlying brain parenchyma without  midline shift. There is mild cerebral atrophy. Small foci of T2 hyperintensity in the cerebral white matter bilaterally are nonspecific but compatible with mild chronic small vessel ischemic disease. Vascular: Segmental flow void loss in the distal vertebral arteries and carotid siphons bilaterally may be  related to technique rather than reflecting diminished flow. Skull and upper cervical spine: Unremarkable bone marrow signal. Sinuses/Orbits: Bilateral cataract extraction. Small mastoid effusions. Clear paranasal sinuses. Other: 6 cm lipoma in the subcutaneous tissues of the suboccipital region. IMPRESSION: 1. 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 2. Unchanged subdural hematoma over the right cerebral convexity. No midline shift. 3. Mild chronic small vessel ischemic disease. Electronically Signed   By: Logan Bores M.D.   On: 12/31/2018 15:25      Assessment: 85 y.o. male with past medical history significant for atrial fibrillation was on Coumadin presented to the hospital with a spontaneous right subdural hematoma now with MRI showing subacute right frontal stroke       Plan:  -Patient has atrial fibrillation he needs to be on anticoagulation for stroke prophylaxis, please restart anticoagulation when neurosurgery is comfortable with that. -Continue Lipitor -DM control  - Carotid ultrasound to rule out significant carotid stenosis at that case CEA can be offered for stroke prophylaxis  -Subdural hematoma can cause seizures, if the patient continues to have episodes of slurred speech seizures might be part of differential diagnosis, will recommend to start patient on antiepileptic medication for a week if neurosurgery are okay with that due to subdural hematoma, routine EEG will not be helpful.   Arnaldo Natal, MD

## 2019-01-02 LAB — BASIC METABOLIC PANEL
Anion gap: 7 (ref 5–15)
BUN: 48 mg/dL — ABNORMAL HIGH (ref 8–23)
CO2: 23 mmol/L (ref 22–32)
Calcium: 8.7 mg/dL — ABNORMAL LOW (ref 8.9–10.3)
Chloride: 106 mmol/L (ref 98–111)
Creatinine, Ser: 1.96 mg/dL — ABNORMAL HIGH (ref 0.61–1.24)
GFR calc Af Amer: 35 mL/min — ABNORMAL LOW (ref >60)
GFR calc non Af Amer: 30 mL/min — ABNORMAL LOW (ref >60)
Glucose, Bld: 214 mg/dL — ABNORMAL HIGH (ref 70–99)
Potassium: 4.5 mmol/L (ref 3.5–5.1)
Sodium: 136 mmol/L (ref 135–145)

## 2019-01-02 LAB — GLUCOSE, CAPILLARY
Glucose-Capillary: 171 mg/dL — ABNORMAL HIGH (ref 70–99)
Glucose-Capillary: 209 mg/dL — ABNORMAL HIGH (ref 70–99)
Glucose-Capillary: 179 mg/dL — ABNORMAL HIGH (ref 70–99)
Glucose-Capillary: 150 mg/dL — ABNORMAL HIGH (ref 70–99)

## 2019-01-02 MED ORDER — LACOSAMIDE 50 MG PO TABS
100.0000 mg | ORAL_TABLET | Freq: Two times a day (BID) | ORAL | Status: DC
  Administered 2019-01-02 – 2019-01-04 (×5): 100 mg via ORAL
  Filled 2019-01-02 (×5): qty 2

## 2019-01-02 MED ORDER — SODIUM CHLORIDE 0.9 % IV SOLN
INTRAVENOUS | Status: DC
  Administered 2019-01-02: 13:00:00 via INTRAVENOUS

## 2019-01-02 NOTE — Progress Notes (Signed)
Occupational Therapy Treatment Patient Details Name: Richard [name] MRN: [id number] DOB: 1931/03/14 Today's Date: 01/02/2019    History of present illness PT is 85 yo male with PMH of recent admit, HTN,PAF,DM, CKDIII, per last podiatry note WBAT in post op shoe on RLE, minimize ambulation. Pt admitted for acute on chronic subdural hematoma.    OT comments  Pt seen for OT treatment on this date. Upon arrival to room, pt asleep in bed. Pt awoke easily and was agreeable to tx. Instructed pt on safe use of AE for self care and LB dressing. Pt verbalized understanding, however reinforcement of education would be helpful for caregivers. Pt required VC for redirection on this date as he occasionally would lose focus and begin speaking about unrelated subjects. Pt easily re-directed to task. Able to better assess UE use on this date. Pt endorses pain in L UE and has limited ROM. Grossly 3-/5 for BUE. Pt with decreased FM coordination and grip as well. Pt reports this is his baseline. Caregiver unavailable to confirm. During session pt endorsed feeling better about going to STR before returning home. Pt eager to return to PLOF with increased independence and safety. Will continue to follow POC. DC recommendation remains appropriate    Follow Up Recommendations  Supervision/Assistance - 24 hour;SNF    Equipment Recommendations  3 in 1 bedside commode;Toilet rise with handles;Tub/shower seat    Recommendations for Other Services      Precautions / Restrictions Precautions Precautions: Fall Precaution Comments: post op shoe for mobility, limit ambulation per last podiatry note Restrictions Weight Bearing Restrictions: Yes RLE Weight Bearing: Weight bearing as tolerated Other Position/Activity Restrictions: through heel       Mobility Bed Mobility                  Transfers                      Balance                                           ADL  either performed or assessed with clinical judgement   ADL                                               Vision       Perception     Praxis      Cognition                                                Exercises Other Exercises Other Exercises: Pt instructed in safe use of AE and routines modification for energy conservation and safety with LB dressing.    Shoulder Instructions       General Comments      Pertinent Vitals/ Pain          Home Living                                          Prior Functioning/Environment  Frequency  Min 1X/week        Progress Toward Goals  OT Goals(current goals can now be found in the care plan section)  Progress towards OT goals: Progressing toward goals  Acute Rehab OT Goals Patient Stated Goal: Pt wants to feel better OT Goal Formulation: With patient Time For Goal Achievement: 01/13/19 Potential to Achieve Goals: Good  Plan Discharge plan remains appropriate    Co-evaluation                 AM-PAC OT "6 Clicks" Daily Activity     Outcome Measure   Help from another person eating meals?: A Little Help from another person taking care of personal grooming?: A Little Help from another person toileting, which includes using toliet, bedpan, or urinal?: A Lot Help from another person bathing (including washing, rinsing, drying)?: A Lot Help from another person to put on and taking off regular upper body clothing?: A Lot Help from another person to put on and taking off regular lower body clothing?: A Lot 6 Click Score: 14    End of Session    OT Visit Diagnosis: Unsteadiness on feet (R26.81);Other abnormalities of gait and mobility (R26.89);History of falling (Z91.81);Pain Pain - Right/Left: Right Pain - part of body: Shoulder   Activity Tolerance Patient tolerated treatment well;No increased pain   Patient Left in bed;with bed  alarm set;with call bell/phone within reach   Nurse Communication          Time: 9191-6606 OT Time Calculation (min): 21 min  Charges: OT General Charges $OT Visit: 1 Visit OT Treatments $Self Care/Home Management : 8-22 mins  Shara Blazing, M.S., OTR/L Ascom: [phone] 01/02/19, 3:16 PM

## 2019-01-02 NOTE — Progress Notes (Signed)
[facility] [place] NAME: Richard [name]    MR#:  [id number]  DATE OF BIRTH:  March 26, 1931  SUBJECTIVE:  CHIEF COMPLAINT:   Chief Complaint  Patient presents with  . Headache   -Occasional periods of confusion. Complains of weakness -Occasional right hand paresthesias  REVIEW OF SYSTEMS:  Review of Systems  Constitutional: Negative for chills, fever and malaise/fatigue.  HENT: Negative for congestion, ear discharge, hearing loss and nosebleeds.   Eyes: Negative for blurred vision and double vision.  Respiratory: Negative for cough, shortness of breath and wheezing.   Cardiovascular: Negative for chest pain and palpitations.  Gastrointestinal: Negative for abdominal pain, constipation, diarrhea, nausea and vomiting.  Genitourinary: Negative for dysuria.  Musculoskeletal: Positive for joint pain. Negative for myalgias.  Neurological: Positive for speech change and focal weakness. Negative for dizziness, seizures and headaches.  Psychiatric/Behavioral: Negative for depression.    DRUG ALLERGIES:  No Known Allergies  VITALS:  Blood pressure 134/84, pulse 70, temperature 98.3 F (36.8 C), resp. rate 14, height 6' (1.829 m), weight 89 kg, SpO2 97 %.  PHYSICAL EXAMINATION:  Physical Exam   GENERAL:  85 y.o.-year-old elderly patient lying in the bed with no acute distress.  EYES: Pupils equal, round, reactive to light and accommodation. No scleral icterus. Extraocular muscles intact.  HEENT: Head atraumatic, normocephalic. Oropharynx and nasopharynx clear.  NECK:  Supple, no jugular venous distention. No thyroid enlargement, no tenderness.  LUNGS: Normal breath sounds bilaterally, no wheezing, rales,rhonchi or crepitation. No use of accessory muscles of respiration.  Decreased breath sounds at the bases CARDIOVASCULAR: S1, S2 normal. No  rubs, or gallops.  3/6 systolic murmur is present ABDOMEN: Soft, nontender, nondistended. Bowel  sounds present. No organomegaly or mass.  EXTREMITIES: No pedal edema, cyanosis, or clubbing.  Right foot is in a postop boot due to recent surgery. NEUROLOGIC: Cranial nerves II through XII are intact.  No slurred speech during my exam.  However after a long time occasionally there is some slurring and word finding difficulty which clears after a minute of rest. Muscle strength 5/5 in all extremities except left upper extremity as the shoulder movement is limited due to pain.. Sensation intact. Gait not checked.  PSYCHIATRIC: The patient is alert and oriented x 3.  Intermittent confusion noted SKIN: No obvious rash, lesion, or ulcer.    LABORATORY PANEL:   CBC Recent Labs  Lab 01/01/19 0456  WBC 9.4  HGB 13.6  HCT 42.6  PLT 150   ------------------------------------------------------------------------------------------------------------------  Chemistries  Recent Labs  Lab 12/28/18 1833  01/02/19 0617  NA 135   < > 136  K 4.7   < > 4.5  CL 103   < > 106  CO2 22   < > 23  GLUCOSE 184*   < > 214*  BUN 37*   < > 48*  CREATININE 1.67*   < > 1.96*  CALCIUM 9.0   < > 8.7*  AST 23  --   --   ALT 14  --   --   ALKPHOS 121  --   --   BILITOT 0.7  --   --    < > = values in this interval not displayed.   ------------------------------------------------------------------------------------------------------------------  Cardiac Enzymes No results for input(s): TROPONINI in the last 168 hours. ------------------------------------------------------------------------------------------------------------------  RADIOLOGY:  Mr Brain Wo Contrast  Result Date: 12/31/2018 CLINICAL DATA:  Subdural hematoma.  Intermittent slurred speech. EXAM: MRI HEAD WITHOUT CONTRAST  TECHNIQUE: Multiplanar, multiecho pulse sequences of the brain and surrounding structures were obtained without intravenous contrast. COMPARISON:  Head CT 12/30/2018 FINDINGS: Brain: There is a 3 mm focus of slightly  restricted diffusion in the posterior right frontal lobe white matter at the level of the centrum semiovale. There is also a 3 mm focus of abnormal diffusion signal involving parasagittal right parietal cortex. A complex subdural hematoma over the right cerebral convexity has not significantly changed in size from the prior CT allowing for differences in modality with maximal thickness of 13 mm in the parietal region. There are both T1 hypointense and T1 hyperintense blood products. Associated mild mass effect is noted on the underlying brain parenchyma without midline shift. There is mild cerebral atrophy. Small foci of T2 hyperintensity in the cerebral white matter bilaterally are nonspecific but compatible with mild chronic small vessel ischemic disease. Vascular: Segmental flow void loss in the distal vertebral arteries and carotid siphons bilaterally may be related to technique rather than reflecting diminished flow. Skull and upper cervical spine: Unremarkable bone marrow signal. Sinuses/Orbits: Bilateral cataract extraction. Small mastoid effusions. Clear paranasal sinuses. Other: 6 cm lipoma in the subcutaneous tissues of the suboccipital region. IMPRESSION: 1. 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 2. Unchanged subdural hematoma over the right cerebral convexity. No midline shift. 3. Mild chronic small vessel ischemic disease. Electronically Signed   By: Logan Bores M.D.   On: 12/31/2018 15:25   US Carotid Bilateral  Result Date: 01/01/2019 CLINICAL DATA:  Small punctate right frontal and parietal infarcts EXAM: BILATERAL CAROTID DUPLEX ULTRASOUND TECHNIQUE: Pearline Cables scale imaging, color Doppler and duplex ultrasound were performed of bilateral carotid and vertebral arteries in the neck. COMPARISON:  12/31/2018 FINDINGS: Criteria: Quantification of carotid stenosis is based on velocity parameters that correlate the residual internal carotid diameter with  NASCET-based stenosis levels, using the diameter of the distal internal carotid lumen as the denominator for stenosis measurement. The following velocity measurements were obtained: RIGHT ICA: 127/13 cm/sec CCA: 29/5 cm/sec SYSTOLIC ICA/CCA RATIO:  1.5 ECA: 180 cm/sec LEFT ICA: 115/11 cm/sec CCA: 188/4 cm/sec SYSTOLIC ICA/CCA RATIO:  0.8 ECA: 122 cm/sec RIGHT CAROTID ARTERY: Mild-to-moderate mixed echogenicity plaque formation. Despite this, no hemodynamically significant right ICA stenosis, velocity elevation, or turbulent flow. Degree of narrowing less than 50%. RIGHT VERTEBRAL ARTERY:  Antegrade LEFT CAROTID ARTERY: Similar mild-to-moderate mixed echogenicity shadowing plaque formation. Despite this, no hemodynamically significant left ICA stenosis, velocity elevation, or turbulent flow. LEFT VERTEBRAL ARTERY:  Antegrade IMPRESSION: Moderate bilateral carotid atherosclerosis. No hemodynamically significant ICA stenosis by ultrasound. Degree of narrowing estimated less than 50% bilaterally by ultrasound criteria. Patent antegrade vertebral flow bilaterally Electronically Signed   By: Jerilynn Mages.  Shick M.D.   On: 01/01/2019 12:39    EKG:   Orders placed or performed during the hospital encounter of 11/28/18  . ED EKG 12-Lead  . ED EKG 12-Lead  . EKG 12-Lead  . EKG 12-Lead  . EKG    ASSESSMENT AND PLAN:   85 year old male with past medical history significant for A. 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  1.  Subdural hematoma-spontaneous right-sided subdural hematoma.  Patient denies any trauma -He is on Coumadin which is discontinued at this time.  Appreciate neurosurgery consult. -Recommended holding anticoagulation for 2 weeks and following up in the office. -Patient concerned  about the slurred speech and some left arm weakness. -Repeat  CT showing stable hematoma.   - MRI of brain showing new infarcts- however will hold off on anticoagulation due to the acute sub dural hematoma for 2 weeks - Appreciate neurology input - carotid dopplers with no hemodynamically significant stenosis.  -  patients speech change etc which could be sub clinical seizure per neurology- started vimpat bid- neuro surgery ok with that No use for EEG at this time.  2.  Left shoulder pain-has known history of arthritis, x-ray confirms significant degenerative disease and arthritis. -Has received corticosteroid shots in the past.  Might need to follow-up with Ortho as outpatient.  3.  Hypertension-continue lisinopril and metoprolol low-dose.  4.  Diabetes mellitus-on sliding scale insulin here.  5.  DVT prophylaxis-teds and SCDs  6. ARF- dehydration, IV fluids and monitor.  Slow improvement noted  -Continue to ambulate with physical therapy.  They have recommended rehab at this time- patient very weak- but adamant about not going to rehab  Discussed with son.  Possible discharge home in a day or 2.     All the records are reviewed and case discussed with Care Management/Social Workerr. Management plans discussed with the patient, family and they are in agreement.  CODE STATUS: DNR  TOTAL TIME TAKING CARE OF THIS PATIENT: 38 minutes.   POSSIBLE D/C IN 2 DAYS, DEPENDING ON CLINICAL CONDITION.   Gladstone Lighter M.D on 01/02/2019 at 12:22 PM  Between 7am to 6pm - Pager - [phone]  After 6pm go to [email or web address] - password [email or web address] [facility]  Office  [phone]  CC: Primary care physician; [name], MD

## 2019-01-03 LAB — BASIC METABOLIC PANEL
Anion gap: 7 (ref 5–15)
BUN: 43 mg/dL — ABNORMAL HIGH (ref 8–23)
CO2: 21 mmol/L — ABNORMAL LOW (ref 22–32)
Calcium: 8.5 mg/dL — ABNORMAL LOW (ref 8.9–10.3)
Chloride: 110 mmol/L (ref 98–111)
Creatinine, Ser: 1.71 mg/dL — ABNORMAL HIGH (ref 0.61–1.24)
GFR calc Af Amer: 41 mL/min — ABNORMAL LOW (ref >60)
GFR calc non Af Amer: 36 mL/min — ABNORMAL LOW (ref >60)
Glucose, Bld: 147 mg/dL — ABNORMAL HIGH (ref 70–99)
Potassium: 4 mmol/L (ref 3.5–5.1)
Sodium: 138 mmol/L (ref 135–145)

## 2019-01-03 LAB — GLUCOSE, CAPILLARY
Glucose-Capillary: 150 mg/dL — ABNORMAL HIGH (ref 70–99)
Glucose-Capillary: 186 mg/dL — ABNORMAL HIGH (ref 70–99)
Glucose-Capillary: 179 mg/dL — ABNORMAL HIGH (ref 70–99)
Glucose-Capillary: 178 mg/dL — ABNORMAL HIGH (ref 70–99)

## 2019-01-03 MED ORDER — POLYETHYLENE GLYCOL 3350 17 G PO PACK: 17.0000 g | PACK | Freq: Every day | ORAL | Status: DC | PRN

## 2019-01-03 MED ORDER — SENNOSIDES-DOCUSATE SODIUM 8.6-50 MG PO TABS
1.0000 | ORAL_TABLET | Freq: Two times a day (BID) | ORAL | Status: DC
  Administered 2019-01-03 – 2019-01-04 (×2): 1 via ORAL
  Filled 2019-01-03 (×3): qty 1

## 2019-01-03 NOTE — NC FL2 (Signed)
[place] LEVEL OF CARE SCREENING TOOL     IDENTIFICATION  Patient Name: Richard [name] Birthdate: [birth year] Sex: male Admission Date (Current Location): 12/28/2018  [place] and [place] Number:  Engineering geologist and Address:  [facility], [address] [place], [place] [address]      Provider Number: [id number]  Attending Physician Name and Address:  Gladstone Lighter, MD  Relative Name and Phone Number:  Richard [name] [facility]) [phone]    Current Level of Care: Hospital Recommended Level of Care: [place] Prior Approval Number:    Date Approved/Denied:   PASRR Number: [id number]  Discharge Plan: SNF    Current Diagnoses: Patient Active Problem List   Diagnosis Date Noted  . Subdural hematoma ([place]) 12/28/2018  . PAF (paroxysmal atrial fibrillation) ([place]) 12/28/2018  . Diabetes ([place]) 12/28/2018  . CKD (chronic kidney disease), stage III ([place]) 12/28/2018  . HTN (hypertension) 12/28/2018  . Pressure injury of skin 11/30/2018  . Acute osteomyelitis of toe of right foot ([place]) 11/28/2018  . Malignant tumor of prostate ([place]) 10/26/2015  . Urinary tract infection with hematuria 01/11/2015  . Bladder neck obstruction 11/04/2012  . 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 Prostate cancer ([place]) 11/04/2012  . Retention of urine 05/06/2012  . History of hematuria 01/30/2012  . Malignant neoplasm of prostate ([place]) 09/30/2011  . Gross hematuria 09/19/2011  . Benign prostatic hyperplasia with urinary retention 09/10/2011  . Elevated prostate specific antigen (PSA) 09/10/2011    Orientation RESPIRATION BLADDER Height & Weight     Self, Time, Situation, Place  Normal Continent Weight: 196 lb 3.4 oz (89 kg) Height:  6' (182.9 cm)  BEHAVIORAL SYMPTOMS/MOOD NEUROLOGICAL BOWEL NUTRITION STATUS      Continent Diet(Heart healthy/carb modified)  AMBULATORY STATUS COMMUNICATION OF NEEDS Skin   Extensive Assist Verbally Other  (Comment)(Right foot wounds. Will need dressing changes)                       Personal Care Assistance Level of Assistance  Bathing, Feeding, Dressing Bathing Assistance: Limited assistance Feeding assistance: Independent Dressing Assistance: Limited assistance     Functional Limitations Info  Sight, Hearing, Speech Sight Info: Adequate Hearing Info: Adequate Speech Info: Impaired(Slurred at times)    [place]  PT (By licensed PT), OT (By licensed OT)     PT Frequency: Up to 5X per week OT Frequency: Up to 3X per week            Contractures Contractures Info: Not present    Additional Factors Info  Code Status, Allergies, Psychotropic, Insulin Sliding Scale Code Status Info: DNR Allergies Info: No known allergies Psychotropic Info: Xanax Insulin Sliding Scale Info: Novolog: 0-5 units QHS and 0-15 units TID with meals       Current Medications (01/03/2019):  This is the current hospital active medication list Current Facility-Administered Medications  Medication Dose Route Frequency Provider Last Rate Last Dose  . acetaminophen (TYLENOL) tablet 650 mg  650 mg Oral Q6H PRN Lance Coon, MD   650 mg at 12/28/18 2351   Or  . acetaminophen (TYLENOL) suppository 650 mg  650 mg Rectal Q6H PRN Lance Coon, MD      . ALPRAZolam Duanne Moron) tablet 0.25 mg  0.25 mg Oral QHS PRN Lance Coon, MD   0.25 mg at 12/31/18 1943  . atorvastatin (LIPITOR) tablet 10 mg  10 mg Oral Daily Lance Coon, MD   10 mg at 01/03/19  2244  . hydrALAZINE (APRESOLINE) injection 10-20 mg  10-20 mg Intravenous Q4H PRN Awilda Bill, NP   10 mg at 12/29/18 0033  . HYDROcodone-acetaminophen (NORCO/VICODIN) 5-325 MG per tablet 1-2 tablet  1-2 tablet Oral Q6H PRN Awilda Bill, NP   1 tablet at 12/31/18 1943  . insulin aspart (novoLOG) injection 0-15 Units  0-15 Units Subcutaneous TID WC Demetrios Loll, MD   2 Units at 01/03/19 0914  . insulin aspart (novoLOG) injection 0-5  Units  0-5 Units Subcutaneous QHS Demetrios Loll, MD      . lacosamide (VIMPAT) tablet 100 mg  100 mg Oral BID Gladstone Lighter, MD   100 mg at 01/03/19 0920  . lisinopril (PRINIVIL,ZESTRIL) tablet 5 mg  5 mg Oral Daily Lance Coon, MD   5 mg at 01/03/19 9753  . metoprolol tartrate (LOPRESSOR) tablet 12.5 mg  12.5 mg Oral BID Demetrios Loll, MD   12.5 mg at 01/03/19 0920  . ondansetron (ZOFRAN) tablet 4 mg  4 mg Oral Q6H PRN Lance Coon, MD       Or  . ondansetron [facility]) injection 4 mg  4 mg Intravenous Q6H PRN Lance Coon, MD         Discharge Medications: Please see discharge summary for a list of discharge medications.  Relevant Imaging Results:  Relevant Lab Results:   Additional Information [phone]  Zettie Pho, LCSW

## 2019-01-03 NOTE — Progress Notes (Signed)
[place] at [place] NAME: Richard [name]    MR#:  [id number]  DATE OF BIRTH:  Apr 04, 1931  SUBJECTIVE:  CHIEF COMPLAINT:   Chief Complaint  Patient presents with  . Headache   -feels better, agreed to go to a rehab now - occasional slurred speech  REVIEW OF SYSTEMS:  Review of Systems  Constitutional: Negative for chills, fever and malaise/fatigue.  HENT: Negative for congestion, ear discharge, hearing loss and nosebleeds.   Eyes: Negative for blurred vision and double vision.  Respiratory: Negative for cough, shortness of breath and wheezing.   Cardiovascular: Negative for chest pain and palpitations.  Gastrointestinal: Negative for abdominal pain, constipation, diarrhea, nausea and vomiting.  Genitourinary: Negative for dysuria.  Musculoskeletal: Positive for joint pain. Negative for myalgias.  Neurological: Positive for speech change and focal weakness. Negative for dizziness, seizures and headaches.  Psychiatric/Behavioral: Negative for depression.    DRUG ALLERGIES:  No Known Allergies  VITALS:  Blood pressure (!) 150/81, pulse 67, temperature 97.6 F (36.4 C), temperature source Axillary, resp. rate 18, height 6' (1.829 m), weight 89 kg, SpO2 99 %.  PHYSICAL EXAMINATION:  Physical Exam   GENERAL:  85 y.o.-year-old elderly patient lying in the bed with no acute distress.  EYES: Pupils equal, round, reactive to light and accommodation. No scleral icterus. Extraocular muscles intact.  HEENT: Head atraumatic, normocephalic. Oropharynx and nasopharynx clear.  NECK:  Supple, no jugular venous distention. No thyroid enlargement, no tenderness.  LUNGS: Normal breath sounds bilaterally, no wheezing, rales,rhonchi or crepitation. No use of accessory muscles of respiration.  Decreased breath sounds at the bases CARDIOVASCULAR: S1, S2 normal. No  rubs, or gallops.  3/6 systolic murmur is present ABDOMEN: Soft, nontender,  nondistended. Bowel sounds present. No organomegaly or mass.  EXTREMITIES: No pedal edema, cyanosis, or clubbing.  Right foot is in a postop boot due to recent surgery. NEUROLOGIC: Cranial nerves II through XII are intact.  No slurred speech during my exam.  However after a long time occasionally there is some slurring and word finding difficulty which clears after a minute of rest. Muscle strength 5/5 in all extremities except left upper extremity as the shoulder movement is limited due to pain.. Sensation intact. Gait not checked.  PSYCHIATRIC: The patient is alert and oriented x 3.  Intermittent confusion noted SKIN: No obvious rash, lesion, or ulcer.    LABORATORY PANEL:   CBC Recent Labs  Lab 01/01/19 0456  WBC 9.4  HGB 13.6  HCT 42.6  PLT 150   ------------------------------------------------------------------------------------------------------------------  Chemistries  Recent Labs  Lab 12/28/18 1833  01/03/19 0419  NA 135   < > 138  K 4.7   < > 4.0  CL 103   < > 110  CO2 22   < > 21*  GLUCOSE 184*   < > 147*  BUN 37*   < > 43*  CREATININE 1.67*   < > 1.71*  CALCIUM 9.0   < > 8.5*  AST 23  --   --   ALT 14  --   --   ALKPHOS 121  --   --   BILITOT 0.7  --   --    < > = values in this interval not displayed.   ------------------------------------------------------------------------------------------------------------------  Cardiac Enzymes No results for input(s): TROPONINI in the last 168 hours. ------------------------------------------------------------------------------------------------------------------  RADIOLOGY:  US Carotid Bilateral  Result Date: 01/01/2019 CLINICAL DATA:  Small punctate right frontal and parietal  infarcts EXAM: BILATERAL CAROTID DUPLEX ULTRASOUND TECHNIQUE: Pearline Cables scale imaging, color Doppler and duplex ultrasound were performed of bilateral carotid and vertebral arteries in the neck. COMPARISON:  12/31/2018 FINDINGS: Criteria:  Quantification of carotid stenosis is based on velocity parameters that correlate the residual internal carotid diameter with NASCET-based stenosis levels, using the diameter of the distal internal carotid lumen as the denominator for stenosis measurement. The following velocity measurements were obtained: RIGHT ICA: 127/13 cm/sec CCA: 58/8 cm/sec SYSTOLIC ICA/CCA RATIO:  1.5 ECA: 180 cm/sec LEFT ICA: 115/11 cm/sec CCA: 502/7 cm/sec SYSTOLIC ICA/CCA RATIO:  0.8 ECA: 122 cm/sec RIGHT CAROTID ARTERY: Mild-to-moderate mixed echogenicity plaque formation. Despite this, no hemodynamically significant right ICA stenosis, velocity elevation, or turbulent flow. Degree of narrowing less than 50%. RIGHT VERTEBRAL ARTERY:  Antegrade LEFT CAROTID ARTERY: Similar mild-to-moderate mixed echogenicity shadowing plaque formation. Despite this, no hemodynamically significant left ICA stenosis, velocity elevation, or turbulent flow. LEFT VERTEBRAL ARTERY:  Antegrade IMPRESSION: Moderate bilateral carotid atherosclerosis. No hemodynamically significant ICA stenosis by ultrasound. Degree of narrowing estimated less than 50% bilaterally by ultrasound criteria. Patent antegrade vertebral flow bilaterally Electronically Signed   By: Jerilynn Mages.  Shick M.D.   On: 01/01/2019 12:39    EKG:   Orders placed or performed during the hospital encounter of 11/28/18  . ED EKG 12-Lead  . ED EKG 12-Lead  . EKG 12-Lead  . EKG 12-Lead  . EKG    ASSESSMENT AND PLAN:   85 year old male with past medical history significant for A. 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  1.  Subdural hematoma-spontaneous right-sided subdural hematoma.  Patient denies any trauma -He is on Coumadin which is discontinued at this time.  Appreciate neurosurgery consult. -Recommended holding anticoagulation  for 2 weeks and following up in the office. -Patient concerned about the slurred speech and some left arm weakness. -Repeat CT showing stable hematoma.   - MRI of brain showing new infarcts- however will hold off on anticoagulation due to the acute sub dural hematoma for 2 weeks - Appreciate neurology input - carotid dopplers with no hemodynamically significant stenosis.  -  patients speech change etc which could be sub clinical seizure per neurology- started vimpat bid- neuro surgery ok with that No use for EEG at this time.  2.  Left shoulder pain-has known history of arthritis, x-ray confirms significant degenerative disease and arthritis. -Has received corticosteroid shots in the past.  Might need to follow-up with Ortho as outpatient.  3.  Hypertension-continue lisinopril and metoprolol low-dose.  4.  Diabetes mellitus-on sliding scale insulin here.  5.  DVT prophylaxis-teds and SCDs  6. ARF- dehydration, IV fluids and monitor.  Slow improvement noted  -Continue to ambulate with physical therapy.   -PT recommended rehab.  Patient is agreeable now.  Discussed with son.  Possible discharge to rehab in a day.   All the records are reviewed and case discussed with Care Management/Social Workerr. Management plans discussed with the patient, family and they are in agreement.  CODE STATUS: DNR  TOTAL TIME TAKING CARE OF THIS PATIENT: 37 minutes.   POSSIBLE D/C IN 1-2 DAYS, DEPENDING ON CLINICAL CONDITION.   Gladstone Lighter M.D on 01/03/2019 at 11:15 AM  Between 7am to 6pm - Pager - [phone]  After 6pm go to [email or web address] - Proofreader  [employer]  [phone]  CC: Primary care physician; [name], MD

## 2019-01-03 NOTE — Clinical Social Work Note (Signed)
CSW met with the medical team to discuss patient transition of care. According to the attending MD, the patient and his family have decided to pursue SNF due to his continued poor health and needs for assistance. The CSW has sent the referral for SNF and will provided bed offers as available. CSW is following.  Santiago Bumpers, MSW, Latanya Presser [phone]

## 2019-01-04 DIAGNOSIS — L97512 Non-pressure chronic ulcer of other part of right foot with fat layer exposed: Secondary | ICD-10-CM | POA: Diagnosis not present

## 2019-01-04 DIAGNOSIS — Z7984 Long term (current) use of oral hypoglycemic drugs: Secondary | ICD-10-CM | POA: Diagnosis not present

## 2019-01-04 DIAGNOSIS — Z8679 Personal history of other diseases of the circulatory system: Secondary | ICD-10-CM | POA: Diagnosis not present

## 2019-01-04 DIAGNOSIS — I69322 Dysarthria following cerebral infarction: Secondary | ICD-10-CM | POA: Diagnosis not present

## 2019-01-04 DIAGNOSIS — E119 Type 2 diabetes mellitus without complications: Secondary | ICD-10-CM | POA: Diagnosis not present

## 2019-01-04 DIAGNOSIS — E1122 Type 2 diabetes mellitus with diabetic chronic kidney disease: Secondary | ICD-10-CM | POA: Diagnosis not present

## 2019-01-04 DIAGNOSIS — R338 Other retention of urine: Secondary | ICD-10-CM | POA: Diagnosis not present

## 2019-01-04 DIAGNOSIS — C61 Malignant neoplasm of prostate: Secondary | ICD-10-CM | POA: Diagnosis not present

## 2019-01-04 DIAGNOSIS — S065X9A Traumatic subdural hemorrhage with loss of consciousness of unspecified duration, initial encounter: Secondary | ICD-10-CM | POA: Diagnosis not present

## 2019-01-04 DIAGNOSIS — G4700 Insomnia, unspecified: Secondary | ICD-10-CM | POA: Diagnosis not present

## 2019-01-04 DIAGNOSIS — I679 Cerebrovascular disease, unspecified: Secondary | ICD-10-CM | POA: Diagnosis not present

## 2019-01-04 DIAGNOSIS — I480 Paroxysmal atrial fibrillation: Secondary | ICD-10-CM | POA: Diagnosis not present

## 2019-01-04 DIAGNOSIS — I69398 Other sequelae of cerebral infarction: Secondary | ICD-10-CM | POA: Diagnosis not present

## 2019-01-04 DIAGNOSIS — N401 Enlarged prostate with lower urinary tract symptoms: Secondary | ICD-10-CM | POA: Diagnosis not present

## 2019-01-04 DIAGNOSIS — I482 Chronic atrial fibrillation, unspecified: Secondary | ICD-10-CM | POA: Diagnosis not present

## 2019-01-04 DIAGNOSIS — N183 Chronic kidney disease, stage 3 (moderate): Secondary | ICD-10-CM | POA: Diagnosis not present

## 2019-01-04 DIAGNOSIS — E559 Vitamin D deficiency, unspecified: Secondary | ICD-10-CM | POA: Diagnosis not present

## 2019-01-04 DIAGNOSIS — B351 Tinea unguium: Secondary | ICD-10-CM | POA: Diagnosis not present

## 2019-01-04 DIAGNOSIS — I69314 Frontal lobe and executive function deficit following cerebral infarction: Secondary | ICD-10-CM | POA: Diagnosis not present

## 2019-01-04 DIAGNOSIS — I69393 Ataxia following cerebral infarction: Secondary | ICD-10-CM | POA: Diagnosis not present

## 2019-01-04 DIAGNOSIS — I69320 Aphasia following cerebral infarction: Secondary | ICD-10-CM | POA: Diagnosis not present

## 2019-01-04 DIAGNOSIS — Z7401 Bed confinement status: Secondary | ICD-10-CM | POA: Diagnosis not present

## 2019-01-04 DIAGNOSIS — E1143 Type 2 diabetes mellitus with diabetic autonomic (poly)neuropathy: Secondary | ICD-10-CM | POA: Diagnosis not present

## 2019-01-04 DIAGNOSIS — I129 Hypertensive chronic kidney disease with stage 1 through stage 4 chronic kidney disease, or unspecified chronic kidney disease: Secondary | ICD-10-CM | POA: Diagnosis not present

## 2019-01-04 DIAGNOSIS — M6281 Muscle weakness (generalized): Secondary | ICD-10-CM | POA: Diagnosis not present

## 2019-01-04 DIAGNOSIS — E1151 Type 2 diabetes mellitus with diabetic peripheral angiopathy without gangrene: Secondary | ICD-10-CM | POA: Diagnosis not present

## 2019-01-04 DIAGNOSIS — S065X9D Traumatic subdural hemorrhage with loss of consciousness of unspecified duration, subsequent encounter: Secondary | ICD-10-CM | POA: Diagnosis not present

## 2019-01-04 DIAGNOSIS — E785 Hyperlipidemia, unspecified: Secondary | ICD-10-CM | POA: Diagnosis not present

## 2019-01-04 DIAGNOSIS — E118 Type 2 diabetes mellitus with unspecified complications: Secondary | ICD-10-CM | POA: Diagnosis not present

## 2019-01-04 DIAGNOSIS — N138 Other obstructive and reflux uropathy: Secondary | ICD-10-CM | POA: Diagnosis not present

## 2019-01-04 DIAGNOSIS — I10 Essential (primary) hypertension: Secondary | ICD-10-CM | POA: Diagnosis not present

## 2019-01-04 DIAGNOSIS — S98131A Complete traumatic amputation of one right lesser toe, initial encounter: Secondary | ICD-10-CM | POA: Diagnosis not present

## 2019-01-04 DIAGNOSIS — M109 Gout, unspecified: Secondary | ICD-10-CM | POA: Diagnosis not present

## 2019-01-04 DIAGNOSIS — M19012 Primary osteoarthritis, left shoulder: Secondary | ICD-10-CM | POA: Diagnosis not present

## 2019-01-04 DIAGNOSIS — Z86711 Personal history of pulmonary embolism: Secondary | ICD-10-CM | POA: Diagnosis not present

## 2019-01-04 DIAGNOSIS — M2550 Pain in unspecified joint: Secondary | ICD-10-CM | POA: Diagnosis not present

## 2019-01-04 DIAGNOSIS — N312 Flaccid neuropathic bladder, not elsewhere classified: Secondary | ICD-10-CM | POA: Diagnosis not present

## 2019-01-04 DIAGNOSIS — G40909 Epilepsy, unspecified, not intractable, without status epilepticus: Secondary | ICD-10-CM | POA: Diagnosis not present

## 2019-01-04 DIAGNOSIS — R5381 Other malaise: Secondary | ICD-10-CM | POA: Diagnosis not present

## 2019-01-04 DIAGNOSIS — Z89411 Acquired absence of right great toe: Secondary | ICD-10-CM | POA: Diagnosis not present

## 2019-01-04 DIAGNOSIS — M810 Age-related osteoporosis without current pathological fracture: Secondary | ICD-10-CM | POA: Diagnosis not present

## 2019-01-04 LAB — GLUCOSE, CAPILLARY
Glucose-Capillary: 158 mg/dL — ABNORMAL HIGH (ref 70–99)
Glucose-Capillary: 211 mg/dL — ABNORMAL HIGH (ref 70–99)

## 2019-01-04 MED ORDER — SENNOSIDES-DOCUSATE SODIUM 8.6-50 MG PO TABS: 1.0000 | ORAL_TABLET | Freq: Two times a day (BID) | ORAL | Status: DC

## 2019-01-04 MED ORDER — LACOSAMIDE 100 MG PO TABS: 100.0000 mg | ORAL_TABLET | Freq: Two times a day (BID) | ORAL | 0 refills | Status: DC

## 2019-01-04 MED ORDER — BISACODYL 10 MG RE SUPP
10.0000 mg | Freq: Every day | RECTAL | Status: DC
  Administered 2019-01-04: 13:00:00 10 mg via RECTAL
  Filled 2019-01-04: qty 1

## 2019-01-04 NOTE — Progress Notes (Signed)
   01/04/19 1400  Clinical Encounter Type  Visited With Patient  Visit Type Initial  Spiritual Encounters  Spiritual Needs Emotional;Grief support  Pt shared about the loss of his wife. Pt shard how satisfied he has been with his life. Pt also shared a regretful behavior in the past and Ch provided a listening ear. Pt is positive yet feels ready to be reunited with his wife. Ch provided a compassionate presence and offered a prayer.

## 2019-01-04 NOTE — Care Management Important Message (Signed)
Important Message  Patient Details  Name: Richard [name] MRN: [id number] Date of Birth: [birth year]   Medicare Important Message Given:  Yes    Richard [name] 01/04/2019, 11:03 AM

## 2019-01-04 NOTE — Discharge Summary (Signed)
[facility] NAME: Richard [name]    MR#:  [id number]  DATE OF BIRTH:  [birth year]  DATE OF ADMISSION:  12/28/2018   ADMITTING PHYSICIAN: Lance Coon, MD  DATE OF DISCHARGE:  01/04/19  PRIMARY CARE PHYSICIAN: [name], MD   ADMISSION DIAGNOSIS:   Subdural hematoma ([place]) [S06.5X9A]  DISCHARGE DIAGNOSIS:   Principal Problem:   Subdural hematoma (HCC) Active Problems:   PAF (paroxysmal atrial fibrillation) (HCC)   Diabetes ([place])   CKD (chronic kidney disease), stage III (HCC)   HTN (hypertension)   SECONDARY DIAGNOSIS:   Past Medical History:  Diagnosis Date  . Atrial fibrillation ([place])   . Cancer ([place])   . Chronic kidney disease   . Colon polyp   . Diabetes mellitus   . History of blood clotting disorder   . HTN (hypertension)     HOSPITAL COURSE:   85 year old male with past medical history significant for A. 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  1.  Subdural hematoma-spontaneous right-sided subdural hematoma.  Patient denies any trauma -He is on Coumadin which is discontinued at this time.  Appreciate neurosurgery consult. -Recommended holding anticoagulation for 2 weeks and following up in the office. -Patient concerned about the slurred speech and some left arm weakness. -Repeat CT showing stable hematoma.   - MRI of brain showing new infarcts- however will hold off on anticoagulation due to the acute sub dural hematoma for 2 weeks - Appreciate neurology input - carotid dopplers with no hemodynamically significant stenosis.  -  patients speech change etc which could be sub clinical seizure per neurology- started vimpat bid- neuro surgery ok with that No use for EEG at this time.  2.  Left shoulder pain-has known history of arthritis,  x-ray confirms significant degenerative disease and arthritis. -Has received corticosteroid shots in the past.  Might need to follow-up with Ortho as outpatient.  3.  Hypertension-continue lisinopril and metoprolol low-dose.  4.  Diabetes mellitus- on amaryl and diet control recommended  5. ARF- dehydration, improved with fluids here   -Continue to ambulate with physical therapy.   -PT recommended rehab.  .  Discussed with son.  Possible discharge to rehab in a day.  DISCHARGE CONDITIONS:   Guarded  CONSULTS OBTAINED:   Treatment Team:  Deetta Perla, MD  Neurology consult by Dr. Arnaldo Natal  DRUG ALLERGIES:   No Known Allergies DISCHARGE MEDICATIONS:   Allergies as of 01/04/2019   No Known Allergies     Medication List    STOP taking these medications   ibuprofen 400 MG tablet Commonly known as:  ADVIL,MOTRIN   warfarin 5 MG tablet Commonly known as:  COUMADIN   warfarin 7.5 MG tablet Commonly known as:  COUMADIN     TAKE these medications   Alpha-Lipoic Acid 200 MG Caps Take 1 capsule by mouth daily.   ALPRAZolam 0.25 MG tablet Commonly known as:  XANAX Take 0.25 mg by mouth at bedtime as needed.   atorvastatin 10 MG tablet Commonly known as:  LIPITOR Take 10 mg by mouth daily.   CALCIUM 600 PO Take 1 tablet by mouth daily.   CIALIS 5 MG tablet Generic drug:  tadalafil TK 1/2 T PO QD What changed:  Another medication with the same name was removed. Continue taking this medication, and  follow the directions you see here.   Coenzyme Q10 10 MG capsule Take 10 mg by mouth.   colchicine 0.6 MG tablet Take 0.6 mg by mouth.   glimepiride 4 MG tablet Commonly known as:  AMARYL Take 4 mg by mouth daily with breakfast.   ibandronate 150 MG tablet Commonly known as:  BONIVA Take 150 mg by mouth every 30 (thirty) days. Take in the morning with a full glass of water, on an empty stomach, and do not take anything else by mouth or lie down for  the next 30 min.   Lacosamide 100 MG Tabs Take 1 tablet (100 mg total) by mouth 2 (two) times daily.   lisinopril 5 MG tablet Commonly known as:  PRINIVIL,ZESTRIL Take 5 mg by mouth daily.   metoprolol tartrate 25 MG tablet Commonly known as:  LOPRESSOR Take 0.5 tablets (12.5 mg total) by mouth 2 (two) times daily.   MULTIVITAMIN ADULT EXTRA C PO Take 1 tablet by mouth daily.   nystatin-triamcinolone cream Commonly known as:  MYCOLOG II Apply to affected areas bid   Red Yeast Rice 600 MG Tabs Take 1 tablet by mouth daily.   senna-docusate 8.6-50 MG tablet Commonly known as:  Senokot-S Take 1 tablet by mouth 2 (two) times daily.   ULORIC 80 MG Tabs Generic drug:  Febuxostat Take 1 tablet by mouth daily.   Vitamin A & D [address] units Tabs Take 1 tablet by mouth daily.   Vitamin D 50 MCG (2000 UT) tablet Take 2,000 Units by mouth daily.        DISCHARGE INSTRUCTIONS:   1. PCP f/u in 1-2 weeks 2. Neuro surgery follow up in 2 weeks  DIET:   Cardiac diet  ACTIVITY:   Activity as tolerated  OXYGEN:   Home Oxygen: No.  Oxygen Delivery: room air  DISCHARGE LOCATION:   nursing home   If you experience worsening of your admission symptoms, develop shortness of breath, life threatening emergency, suicidal or homicidal thoughts you must seek medical attention immediately by calling 911 or calling your MD immediately  if symptoms less severe.  You Must read complete instructions/literature along with all the possible adverse reactions/side effects for all the Medicines you take and that have been prescribed to you. Take any new Medicines after you have completely understood and accpet all the possible adverse reactions/side effects.   Please note  You were cared for by a hospitalist during your hospital stay. If you have any questions about your discharge medications or the care you received while you were in the hospital after you are discharged, you can  call the unit and asked to speak with the hospitalist on call if the hospitalist that took care of you is not available. Once you are discharged, your primary care physician will handle any further medical issues. Please note that NO REFILLS for any discharge medications will be authorized once you are discharged, as it is imperative that you return to your primary care physician (or establish a relationship with a primary care physician if you do not have one) for your aftercare needs so that they can reassess your need for medications and monitor your lab values.    On the day of Discharge:  VITAL SIGNS:   Blood pressure 137/72, pulse 77, temperature 97.9 F (36.6 C), temperature source Oral, resp. rate 16, height 6' (1.829 m), weight 89 kg, SpO2 100 %.  PHYSICAL EXAMINATION:     GENERAL:  85 y.o.-year-old elderly patient  lying in the bed with no acute distress.  EYES: Pupils equal, round, reactive to light and accommodation. No scleral icterus. Extraocular muscles intact.  HEENT: Head atraumatic, normocephalic. Oropharynx and nasopharynx clear.  NECK:  Supple, no jugular venous distention. No thyroid enlargement, no tenderness.  LUNGS: Normal breath sounds bilaterally, no wheezing, rales,rhonchi or crepitation. No use of accessory muscles of respiration.  Decreased breath sounds at the bases CARDIOVASCULAR: S1, S2 normal. No  rubs, or gallops.  3/6 systolic murmur is present ABDOMEN: Soft, nontender, nondistended. Bowel sounds present. No organomegaly or mass.  EXTREMITIES: No pedal edema, cyanosis, or clubbing.  Right foot is in a postop boot due to recent surgery. NEUROLOGIC: Cranial nerves II through XII are intact.  No slurred speech during my exam.  However after a long time occasionally there is some slurring and word finding difficulty which clears after a minute of rest. Muscle strength 5/5 in all extremities except left upper extremity as the shoulder movement is limited due to  pain.. Sensation intact. Gait not checked.  PSYCHIATRIC: The patient is alert and oriented x 3.  Intermittent confusion noted SKIN: No obvious rash, lesion, or ulcer  DATA REVIEW:   CBC Recent Labs  Lab 01/01/19 0456  WBC 9.4  HGB 13.6  HCT 42.6  PLT 150    Chemistries  Recent Labs  Lab 12/28/18 1833  01/03/19 0419  NA 135   < > 138  K 4.7   < > 4.0  CL 103   < > 110  CO2 22   < > 21*  GLUCOSE 184*   < > 147*  BUN 37*   < > 43*  CREATININE 1.67*   < > 1.71*  CALCIUM 9.0   < > 8.5*  AST 23  --   --   ALT 14  --   --   ALKPHOS 121  --   --   BILITOT 0.7  --   --    < > = values in this interval not displayed.     Microbiology Results  Results for orders placed or performed during the hospital encounter of 12/28/18  MRSA PCR Screening     Status: None   Collection Time: 12/29/18 12:29 AM  Result Value Ref Range Status   MRSA by PCR NEGATIVE NEGATIVE Final    Comment:        The GeneXpert MRSA Assay (FDA approved for NASAL specimens only), is one component of a comprehensive MRSA colonization surveillance program. It is not intended to diagnose MRSA infection nor to guide or monitor treatment for MRSA infections. Performed at [facility], [address], [place], [place] [address]     RADIOLOGY:  No results found.   Management plans discussed with the patient, family and they are in agreement.  CODE STATUS:     Code Status Orders  (From admission, onward)         Start     Ordered   12/29/18 0012  Do not attempt resuscitation (DNR)  Continuous    Question Answer Comment  In the event of cardiac or respiratory ARREST Do not call a "code blue"   In the event of cardiac or respiratory ARREST Do not perform Intubation, CPR, defibrillation or ACLS   In the event of cardiac or respiratory ARREST Use medication by any route, position, wound care, and other measures to relive pain and suffering. May use oxygen, suction and manual treatment of  airway obstruction as needed  for comfort.      12/29/18 0011        Code Status History    Date Active Date Inactive Code Status Order ID Comments User Context   11/29/2018 1201 12/04/2018 2149 DNR [id number]  Dustin Flock, MD Inpatient   11/28/2018 1800 11/29/2018 1201 Full Code [id number]  Henreitta Leber, MD Inpatient    Advance Directive Documentation     Most Recent Value  Type of Advance Directive  Healthcare Power of Attorney  Pre-existing out of facility DNR order (yellow form or pink MOST form)  -  "MOST" Form in Place?  -      TOTAL TIME TAKING CARE OF THIS PATIENT: 38 minutes.    Gladstone Lighter M.D on 01/04/2019 at 11:14 AM  Between 7am to 6pm - Pager - [phone]  After 6pm go to [email or web address] - Proofreader  [facility]  Office  [phone]  CC: Primary care physician; [name], MD   Note: This dictation was prepared with Dragon dictation along with smaller phrase technology. Any transcriptional errors that result from this process are unintentional.

## 2019-01-04 NOTE — Progress Notes (Signed)
Called report to [employer] and LPN at [facility]

## 2019-01-04 NOTE — Clinical Social Work Note (Signed)
CSW spoke with patient's son Marshun Duva [phone] and gave bed offers. Son chose bed at [facility]. CSW explained that patient is medically ready for discharge today. Son is in agreement with discharge today to [facility]. CSW notified Magda Paganini at [facility] of discharge today. Patient will be transported by EMS. RN will call report and call for transport.   [place], [place]

## 2019-01-05 DIAGNOSIS — I482 Chronic atrial fibrillation, unspecified: Secondary | ICD-10-CM | POA: Diagnosis not present

## 2019-01-05 DIAGNOSIS — G40909 Epilepsy, unspecified, not intractable, without status epilepticus: Secondary | ICD-10-CM | POA: Diagnosis not present

## 2019-01-05 DIAGNOSIS — E118 Type 2 diabetes mellitus with unspecified complications: Secondary | ICD-10-CM | POA: Diagnosis not present

## 2019-01-05 DIAGNOSIS — S065X9D Traumatic subdural hemorrhage with loss of consciousness of unspecified duration, subsequent encounter: Secondary | ICD-10-CM | POA: Diagnosis not present

## 2019-01-05 DIAGNOSIS — I10 Essential (primary) hypertension: Secondary | ICD-10-CM | POA: Diagnosis not present

## 2019-01-05 DIAGNOSIS — I679 Cerebrovascular disease, unspecified: Secondary | ICD-10-CM | POA: Diagnosis not present

## 2019-01-12 DIAGNOSIS — Z8679 Personal history of other diseases of the circulatory system: Secondary | ICD-10-CM | POA: Diagnosis not present

## 2019-01-13 DIAGNOSIS — L97512 Non-pressure chronic ulcer of other part of right foot with fat layer exposed: Secondary | ICD-10-CM | POA: Diagnosis not present

## 2019-01-13 DIAGNOSIS — E1151 Type 2 diabetes mellitus with diabetic peripheral angiopathy without gangrene: Secondary | ICD-10-CM | POA: Diagnosis not present

## 2019-01-13 DIAGNOSIS — S98131A Complete traumatic amputation of one right lesser toe, initial encounter: Secondary | ICD-10-CM | POA: Diagnosis not present

## 2019-01-13 DIAGNOSIS — E1143 Type 2 diabetes mellitus with diabetic autonomic (poly)neuropathy: Secondary | ICD-10-CM | POA: Diagnosis not present

## 2019-01-26 DIAGNOSIS — E1143 Type 2 diabetes mellitus with diabetic autonomic (poly)neuropathy: Secondary | ICD-10-CM | POA: Diagnosis not present

## 2019-01-26 DIAGNOSIS — B351 Tinea unguium: Secondary | ICD-10-CM | POA: Diagnosis not present

## 2019-02-06 DIAGNOSIS — Z7901 Long term (current) use of anticoagulants: Secondary | ICD-10-CM | POA: Diagnosis not present

## 2019-02-06 DIAGNOSIS — Z8673 Personal history of transient ischemic attack (TIA), and cerebral infarction without residual deficits: Secondary | ICD-10-CM | POA: Diagnosis not present

## 2019-02-06 DIAGNOSIS — Z89421 Acquired absence of other right toe(s): Secondary | ICD-10-CM | POA: Diagnosis not present

## 2019-02-06 DIAGNOSIS — L89312 Pressure ulcer of right buttock, stage 2: Secondary | ICD-10-CM | POA: Diagnosis not present

## 2019-02-06 DIAGNOSIS — E1143 Type 2 diabetes mellitus with diabetic autonomic (poly)neuropathy: Secondary | ICD-10-CM | POA: Diagnosis not present

## 2019-02-06 DIAGNOSIS — E1151 Type 2 diabetes mellitus with diabetic peripheral angiopathy without gangrene: Secondary | ICD-10-CM | POA: Diagnosis not present

## 2019-02-06 DIAGNOSIS — Z4781 Encounter for orthopedic aftercare following surgical amputation: Secondary | ICD-10-CM | POA: Diagnosis not present

## 2019-02-06 DIAGNOSIS — Z7984 Long term (current) use of oral hypoglycemic drugs: Secondary | ICD-10-CM | POA: Diagnosis not present

## 2019-02-09 ENCOUNTER — Other Ambulatory Visit: Payer: Self-pay | Admitting: *Deleted

## 2019-02-09 DIAGNOSIS — Z89421 Acquired absence of other right toe(s): Secondary | ICD-10-CM | POA: Diagnosis not present

## 2019-02-09 DIAGNOSIS — Z4781 Encounter for orthopedic aftercare following surgical amputation: Secondary | ICD-10-CM | POA: Diagnosis not present

## 2019-02-09 DIAGNOSIS — L89312 Pressure ulcer of right buttock, stage 2: Secondary | ICD-10-CM | POA: Diagnosis not present

## 2019-02-09 DIAGNOSIS — E1151 Type 2 diabetes mellitus with diabetic peripheral angiopathy without gangrene: Secondary | ICD-10-CM | POA: Diagnosis not present

## 2019-02-09 DIAGNOSIS — E1143 Type 2 diabetes mellitus with diabetic autonomic (poly)neuropathy: Secondary | ICD-10-CM | POA: Diagnosis not present

## 2019-02-09 DIAGNOSIS — Z8673 Personal history of transient ischemic attack (TIA), and cerebral infarction without residual deficits: Secondary | ICD-10-CM | POA: Diagnosis not present

## 2019-02-09 NOTE — Patient Outreach (Signed)
[facility]) Care Management  02/09/2019  Richard [name] 07-11-1931 [id number]   Facility site visit to [employer]. Collaboration with Tiffany SW/dc planner concerning patient's progress, discharge plan and potential care management needs. Tiffany states patient has all needs met and has 24 hour care at home. She did not feel he was a candidate for [facility] CM at this time.    Collaboration with discharge planner reveals patient's discharge plan is home with 24 hour care and there were no identifiable [facility] care management needs. [facility] Care Management services not appropriate at this time. If patient's post SNF discharge needs change made facility discharge planner aware to place [place] Management consult.   For questions please contact:   Aniesa Boback RN, [facility] Liaison  [phone]) Business Mobile [phone]) Toll free office

## 2019-02-10 DIAGNOSIS — Z8673 Personal history of transient ischemic attack (TIA), and cerebral infarction without residual deficits: Secondary | ICD-10-CM | POA: Diagnosis not present

## 2019-02-10 DIAGNOSIS — L89312 Pressure ulcer of right buttock, stage 2: Secondary | ICD-10-CM | POA: Diagnosis not present

## 2019-02-10 DIAGNOSIS — E1151 Type 2 diabetes mellitus with diabetic peripheral angiopathy without gangrene: Secondary | ICD-10-CM | POA: Diagnosis not present

## 2019-02-10 DIAGNOSIS — E1143 Type 2 diabetes mellitus with diabetic autonomic (poly)neuropathy: Secondary | ICD-10-CM | POA: Diagnosis not present

## 2019-02-10 DIAGNOSIS — Z89421 Acquired absence of other right toe(s): Secondary | ICD-10-CM | POA: Diagnosis not present

## 2019-02-10 DIAGNOSIS — Z4781 Encounter for orthopedic aftercare following surgical amputation: Secondary | ICD-10-CM | POA: Diagnosis not present

## 2019-02-12 DIAGNOSIS — E1151 Type 2 diabetes mellitus with diabetic peripheral angiopathy without gangrene: Secondary | ICD-10-CM | POA: Diagnosis not present

## 2019-02-12 DIAGNOSIS — Z8673 Personal history of transient ischemic attack (TIA), and cerebral infarction without residual deficits: Secondary | ICD-10-CM | POA: Diagnosis not present

## 2019-02-12 DIAGNOSIS — E1143 Type 2 diabetes mellitus with diabetic autonomic (poly)neuropathy: Secondary | ICD-10-CM | POA: Diagnosis not present

## 2019-02-12 DIAGNOSIS — L89312 Pressure ulcer of right buttock, stage 2: Secondary | ICD-10-CM | POA: Diagnosis not present

## 2019-02-12 DIAGNOSIS — Z89421 Acquired absence of other right toe(s): Secondary | ICD-10-CM | POA: Diagnosis not present

## 2019-02-12 DIAGNOSIS — Z4781 Encounter for orthopedic aftercare following surgical amputation: Secondary | ICD-10-CM | POA: Diagnosis not present

## 2019-02-15 DIAGNOSIS — Z89421 Acquired absence of other right toe(s): Secondary | ICD-10-CM | POA: Diagnosis not present

## 2019-02-15 DIAGNOSIS — E1143 Type 2 diabetes mellitus with diabetic autonomic (poly)neuropathy: Secondary | ICD-10-CM | POA: Diagnosis not present

## 2019-02-15 DIAGNOSIS — Z4781 Encounter for orthopedic aftercare following surgical amputation: Secondary | ICD-10-CM | POA: Diagnosis not present

## 2019-02-15 DIAGNOSIS — E1151 Type 2 diabetes mellitus with diabetic peripheral angiopathy without gangrene: Secondary | ICD-10-CM | POA: Diagnosis not present

## 2019-02-15 DIAGNOSIS — Z8673 Personal history of transient ischemic attack (TIA), and cerebral infarction without residual deficits: Secondary | ICD-10-CM | POA: Diagnosis not present

## 2019-02-15 DIAGNOSIS — L89312 Pressure ulcer of right buttock, stage 2: Secondary | ICD-10-CM | POA: Diagnosis not present

## 2019-02-16 DIAGNOSIS — Z8673 Personal history of transient ischemic attack (TIA), and cerebral infarction without residual deficits: Secondary | ICD-10-CM | POA: Diagnosis not present

## 2019-02-16 DIAGNOSIS — E1151 Type 2 diabetes mellitus with diabetic peripheral angiopathy without gangrene: Secondary | ICD-10-CM | POA: Diagnosis not present

## 2019-02-16 DIAGNOSIS — L89312 Pressure ulcer of right buttock, stage 2: Secondary | ICD-10-CM | POA: Diagnosis not present

## 2019-02-16 DIAGNOSIS — Z4781 Encounter for orthopedic aftercare following surgical amputation: Secondary | ICD-10-CM | POA: Diagnosis not present

## 2019-02-16 DIAGNOSIS — E1143 Type 2 diabetes mellitus with diabetic autonomic (poly)neuropathy: Secondary | ICD-10-CM | POA: Diagnosis not present

## 2019-02-16 DIAGNOSIS — Z89421 Acquired absence of other right toe(s): Secondary | ICD-10-CM | POA: Diagnosis not present

## 2019-02-18 DIAGNOSIS — Z4781 Encounter for orthopedic aftercare following surgical amputation: Secondary | ICD-10-CM | POA: Diagnosis not present

## 2019-02-18 DIAGNOSIS — E1143 Type 2 diabetes mellitus with diabetic autonomic (poly)neuropathy: Secondary | ICD-10-CM | POA: Diagnosis not present

## 2019-02-18 DIAGNOSIS — Z89421 Acquired absence of other right toe(s): Secondary | ICD-10-CM | POA: Diagnosis not present

## 2019-02-18 DIAGNOSIS — Z8673 Personal history of transient ischemic attack (TIA), and cerebral infarction without residual deficits: Secondary | ICD-10-CM | POA: Diagnosis not present

## 2019-02-18 DIAGNOSIS — E1151 Type 2 diabetes mellitus with diabetic peripheral angiopathy without gangrene: Secondary | ICD-10-CM | POA: Diagnosis not present

## 2019-02-18 DIAGNOSIS — L89312 Pressure ulcer of right buttock, stage 2: Secondary | ICD-10-CM | POA: Diagnosis not present

## 2019-02-22 DIAGNOSIS — E1143 Type 2 diabetes mellitus with diabetic autonomic (poly)neuropathy: Secondary | ICD-10-CM | POA: Diagnosis not present

## 2019-02-22 DIAGNOSIS — Z4781 Encounter for orthopedic aftercare following surgical amputation: Secondary | ICD-10-CM | POA: Diagnosis not present

## 2019-02-22 DIAGNOSIS — Z8673 Personal history of transient ischemic attack (TIA), and cerebral infarction without residual deficits: Secondary | ICD-10-CM | POA: Diagnosis not present

## 2019-02-22 DIAGNOSIS — L89312 Pressure ulcer of right buttock, stage 2: Secondary | ICD-10-CM | POA: Diagnosis not present

## 2019-02-22 DIAGNOSIS — E1151 Type 2 diabetes mellitus with diabetic peripheral angiopathy without gangrene: Secondary | ICD-10-CM | POA: Diagnosis not present

## 2019-02-22 DIAGNOSIS — Z89421 Acquired absence of other right toe(s): Secondary | ICD-10-CM | POA: Diagnosis not present

## 2019-02-25 DIAGNOSIS — Z89421 Acquired absence of other right toe(s): Secondary | ICD-10-CM | POA: Diagnosis not present

## 2019-02-25 DIAGNOSIS — E1151 Type 2 diabetes mellitus with diabetic peripheral angiopathy without gangrene: Secondary | ICD-10-CM | POA: Diagnosis not present

## 2019-02-25 DIAGNOSIS — Z4781 Encounter for orthopedic aftercare following surgical amputation: Secondary | ICD-10-CM | POA: Diagnosis not present

## 2019-02-25 DIAGNOSIS — L89312 Pressure ulcer of right buttock, stage 2: Secondary | ICD-10-CM | POA: Diagnosis not present

## 2019-02-25 DIAGNOSIS — Z8673 Personal history of transient ischemic attack (TIA), and cerebral infarction without residual deficits: Secondary | ICD-10-CM | POA: Diagnosis not present

## 2019-02-25 DIAGNOSIS — E1143 Type 2 diabetes mellitus with diabetic autonomic (poly)neuropathy: Secondary | ICD-10-CM | POA: Diagnosis not present

## 2019-03-01 DIAGNOSIS — L89312 Pressure ulcer of right buttock, stage 2: Secondary | ICD-10-CM | POA: Diagnosis not present

## 2019-03-01 DIAGNOSIS — E1143 Type 2 diabetes mellitus with diabetic autonomic (poly)neuropathy: Secondary | ICD-10-CM | POA: Diagnosis not present

## 2019-03-01 DIAGNOSIS — Z89421 Acquired absence of other right toe(s): Secondary | ICD-10-CM | POA: Diagnosis not present

## 2019-03-01 DIAGNOSIS — Z4781 Encounter for orthopedic aftercare following surgical amputation: Secondary | ICD-10-CM | POA: Diagnosis not present

## 2019-03-01 DIAGNOSIS — E1151 Type 2 diabetes mellitus with diabetic peripheral angiopathy without gangrene: Secondary | ICD-10-CM | POA: Diagnosis not present

## 2019-03-01 DIAGNOSIS — Z8673 Personal history of transient ischemic attack (TIA), and cerebral infarction without residual deficits: Secondary | ICD-10-CM | POA: Diagnosis not present

## 2019-03-02 DIAGNOSIS — Z89421 Acquired absence of other right toe(s): Secondary | ICD-10-CM | POA: Diagnosis not present

## 2019-03-02 DIAGNOSIS — L89312 Pressure ulcer of right buttock, stage 2: Secondary | ICD-10-CM | POA: Diagnosis not present

## 2019-03-02 DIAGNOSIS — Z8673 Personal history of transient ischemic attack (TIA), and cerebral infarction without residual deficits: Secondary | ICD-10-CM | POA: Diagnosis not present

## 2019-03-02 DIAGNOSIS — E1151 Type 2 diabetes mellitus with diabetic peripheral angiopathy without gangrene: Secondary | ICD-10-CM | POA: Diagnosis not present

## 2019-03-02 DIAGNOSIS — E1143 Type 2 diabetes mellitus with diabetic autonomic (poly)neuropathy: Secondary | ICD-10-CM | POA: Diagnosis not present

## 2019-03-02 DIAGNOSIS — Z4781 Encounter for orthopedic aftercare following surgical amputation: Secondary | ICD-10-CM | POA: Diagnosis not present

## 2019-03-04 DIAGNOSIS — L89312 Pressure ulcer of right buttock, stage 2: Secondary | ICD-10-CM | POA: Diagnosis not present

## 2019-03-04 DIAGNOSIS — E1151 Type 2 diabetes mellitus with diabetic peripheral angiopathy without gangrene: Secondary | ICD-10-CM | POA: Diagnosis not present

## 2019-03-04 DIAGNOSIS — Z8673 Personal history of transient ischemic attack (TIA), and cerebral infarction without residual deficits: Secondary | ICD-10-CM | POA: Diagnosis not present

## 2019-03-04 DIAGNOSIS — E1143 Type 2 diabetes mellitus with diabetic autonomic (poly)neuropathy: Secondary | ICD-10-CM | POA: Diagnosis not present

## 2019-03-04 DIAGNOSIS — Z4781 Encounter for orthopedic aftercare following surgical amputation: Secondary | ICD-10-CM | POA: Diagnosis not present

## 2019-03-04 DIAGNOSIS — Z89421 Acquired absence of other right toe(s): Secondary | ICD-10-CM | POA: Diagnosis not present

## 2019-03-08 DIAGNOSIS — E1151 Type 2 diabetes mellitus with diabetic peripheral angiopathy without gangrene: Secondary | ICD-10-CM | POA: Diagnosis not present

## 2019-03-08 DIAGNOSIS — Z4781 Encounter for orthopedic aftercare following surgical amputation: Secondary | ICD-10-CM | POA: Diagnosis not present

## 2019-03-08 DIAGNOSIS — E1143 Type 2 diabetes mellitus with diabetic autonomic (poly)neuropathy: Secondary | ICD-10-CM | POA: Diagnosis not present

## 2019-03-08 DIAGNOSIS — Z8673 Personal history of transient ischemic attack (TIA), and cerebral infarction without residual deficits: Secondary | ICD-10-CM | POA: Diagnosis not present

## 2019-03-08 DIAGNOSIS — L89312 Pressure ulcer of right buttock, stage 2: Secondary | ICD-10-CM | POA: Diagnosis not present

## 2019-03-08 DIAGNOSIS — Z7901 Long term (current) use of anticoagulants: Secondary | ICD-10-CM | POA: Diagnosis not present

## 2019-03-08 DIAGNOSIS — Z7984 Long term (current) use of oral hypoglycemic drugs: Secondary | ICD-10-CM | POA: Diagnosis not present

## 2019-03-08 DIAGNOSIS — Z89421 Acquired absence of other right toe(s): Secondary | ICD-10-CM | POA: Diagnosis not present

## 2019-03-09 DIAGNOSIS — Z89421 Acquired absence of other right toe(s): Secondary | ICD-10-CM | POA: Diagnosis not present

## 2019-03-09 DIAGNOSIS — Z8673 Personal history of transient ischemic attack (TIA), and cerebral infarction without residual deficits: Secondary | ICD-10-CM | POA: Diagnosis not present

## 2019-03-09 DIAGNOSIS — L89312 Pressure ulcer of right buttock, stage 2: Secondary | ICD-10-CM | POA: Diagnosis not present

## 2019-03-09 DIAGNOSIS — E1151 Type 2 diabetes mellitus with diabetic peripheral angiopathy without gangrene: Secondary | ICD-10-CM | POA: Diagnosis not present

## 2019-03-09 DIAGNOSIS — Z4781 Encounter for orthopedic aftercare following surgical amputation: Secondary | ICD-10-CM | POA: Diagnosis not present

## 2019-03-09 DIAGNOSIS — E1143 Type 2 diabetes mellitus with diabetic autonomic (poly)neuropathy: Secondary | ICD-10-CM | POA: Diagnosis not present

## 2019-03-20 DIAGNOSIS — E785 Hyperlipidemia, unspecified: Secondary | ICD-10-CM | POA: Diagnosis not present

## 2019-03-20 DIAGNOSIS — M109 Gout, unspecified: Secondary | ICD-10-CM | POA: Diagnosis not present

## 2019-03-20 DIAGNOSIS — Z7901 Long term (current) use of anticoagulants: Secondary | ICD-10-CM | POA: Diagnosis not present

## 2019-03-20 DIAGNOSIS — I4891 Unspecified atrial fibrillation: Secondary | ICD-10-CM | POA: Diagnosis not present

## 2019-03-20 DIAGNOSIS — Z0000 Encounter for general adult medical examination without abnormal findings: Secondary | ICD-10-CM | POA: Diagnosis not present

## 2019-03-20 DIAGNOSIS — I10 Essential (primary) hypertension: Secondary | ICD-10-CM | POA: Diagnosis not present

## 2019-03-20 DIAGNOSIS — E119 Type 2 diabetes mellitus without complications: Secondary | ICD-10-CM | POA: Diagnosis not present

## 2019-03-24 DIAGNOSIS — E1143 Type 2 diabetes mellitus with diabetic autonomic (poly)neuropathy: Secondary | ICD-10-CM | POA: Diagnosis not present

## 2019-03-24 DIAGNOSIS — Z4781 Encounter for orthopedic aftercare following surgical amputation: Secondary | ICD-10-CM | POA: Diagnosis not present

## 2019-03-24 DIAGNOSIS — Z8673 Personal history of transient ischemic attack (TIA), and cerebral infarction without residual deficits: Secondary | ICD-10-CM | POA: Diagnosis not present

## 2019-03-24 DIAGNOSIS — Z89421 Acquired absence of other right toe(s): Secondary | ICD-10-CM | POA: Diagnosis not present

## 2019-03-24 DIAGNOSIS — E1151 Type 2 diabetes mellitus with diabetic peripheral angiopathy without gangrene: Secondary | ICD-10-CM | POA: Diagnosis not present

## 2019-03-24 DIAGNOSIS — L89312 Pressure ulcer of right buttock, stage 2: Secondary | ICD-10-CM | POA: Diagnosis not present

## 2019-04-05 DIAGNOSIS — Z4781 Encounter for orthopedic aftercare following surgical amputation: Secondary | ICD-10-CM | POA: Diagnosis not present

## 2019-04-05 DIAGNOSIS — E1151 Type 2 diabetes mellitus with diabetic peripheral angiopathy without gangrene: Secondary | ICD-10-CM | POA: Diagnosis not present

## 2019-04-05 DIAGNOSIS — Z89421 Acquired absence of other right toe(s): Secondary | ICD-10-CM | POA: Diagnosis not present

## 2019-04-05 DIAGNOSIS — E1143 Type 2 diabetes mellitus with diabetic autonomic (poly)neuropathy: Secondary | ICD-10-CM | POA: Diagnosis not present

## 2019-04-05 DIAGNOSIS — L89312 Pressure ulcer of right buttock, stage 2: Secondary | ICD-10-CM | POA: Diagnosis not present

## 2019-04-05 DIAGNOSIS — Z8673 Personal history of transient ischemic attack (TIA), and cerebral infarction without residual deficits: Secondary | ICD-10-CM | POA: Diagnosis not present

## 2019-04-27 DIAGNOSIS — I4891 Unspecified atrial fibrillation: Secondary | ICD-10-CM | POA: Diagnosis not present

## 2019-04-27 DIAGNOSIS — E118 Type 2 diabetes mellitus with unspecified complications: Secondary | ICD-10-CM | POA: Diagnosis not present

## 2019-04-27 DIAGNOSIS — G40909 Epilepsy, unspecified, not intractable, without status epilepticus: Secondary | ICD-10-CM | POA: Diagnosis not present

## 2019-04-27 DIAGNOSIS — Z7901 Long term (current) use of anticoagulants: Secondary | ICD-10-CM | POA: Diagnosis not present

## 2019-04-27 DIAGNOSIS — Z7189 Other specified counseling: Secondary | ICD-10-CM | POA: Diagnosis not present

## 2019-05-07 DIAGNOSIS — Z7901 Long term (current) use of anticoagulants: Secondary | ICD-10-CM | POA: Diagnosis not present

## 2019-05-07 DIAGNOSIS — I480 Paroxysmal atrial fibrillation: Secondary | ICD-10-CM | POA: Diagnosis not present

## 2019-05-26 DIAGNOSIS — E118 Type 2 diabetes mellitus with unspecified complications: Secondary | ICD-10-CM | POA: Diagnosis not present

## 2019-05-26 DIAGNOSIS — I4891 Unspecified atrial fibrillation: Secondary | ICD-10-CM | POA: Diagnosis not present

## 2019-05-26 DIAGNOSIS — Z7901 Long term (current) use of anticoagulants: Secondary | ICD-10-CM | POA: Diagnosis not present

## 2019-05-26 DIAGNOSIS — N183 Chronic kidney disease, stage 3 (moderate): Secondary | ICD-10-CM | POA: Diagnosis not present

## 2019-05-26 DIAGNOSIS — I10 Essential (primary) hypertension: Secondary | ICD-10-CM | POA: Diagnosis not present

## 2019-05-28 ENCOUNTER — Ambulatory Visit: Payer: Self-pay | Admitting: Urology

## 2019-05-28 ENCOUNTER — Telehealth: Payer: Self-pay | Admitting: Urology

## 2019-05-28 NOTE — Telephone Encounter (Signed)
Sent this message to [facility] and it has been rescheduled.

## 2019-05-28 NOTE — Telephone Encounter (Signed)
Pt came into our office today, he thought his appt was today. He is on the schedule for 07/12/2019 and he would like to be seen sooner if possible.

## 2019-05-31 DIAGNOSIS — I480 Paroxysmal atrial fibrillation: Secondary | ICD-10-CM | POA: Diagnosis not present

## 2019-05-31 DIAGNOSIS — Z7901 Long term (current) use of anticoagulants: Secondary | ICD-10-CM | POA: Diagnosis not present

## 2019-06-01 ENCOUNTER — Encounter: Payer: Self-pay | Admitting: Urology

## 2019-06-01 ENCOUNTER — Other Ambulatory Visit: Payer: Self-pay

## 2019-06-01 ENCOUNTER — Ambulatory Visit (INDEPENDENT_AMBULATORY_CARE_PROVIDER_SITE_OTHER): Payer: Medicare Other | Admitting: Urology

## 2019-06-01 VITALS — BP 126/73 | HR 65 | Ht 72.0 in | Wt 220.0 lb

## 2019-06-01 DIAGNOSIS — N312 Flaccid neuropathic bladder, not elsewhere classified: Secondary | ICD-10-CM | POA: Diagnosis not present

## 2019-06-01 DIAGNOSIS — C61 Malignant neoplasm of prostate: Secondary | ICD-10-CM

## 2019-06-01 DIAGNOSIS — R8279 Other abnormal findings on microbiological examination of urine: Secondary | ICD-10-CM | POA: Diagnosis not present

## 2019-06-01 NOTE — Progress Notes (Signed)
06/01/2019 3:53 PM   Wendie Simmer 01-Jan-1931 [id number]  Referring provider: [name], MD [place],  [place] [address]  CC: Prostate cancer, atonic bladder on CIC  HPI: I saw Mr. Nolte today in urology clinic for prostate cancer and atonic bladder.  He was previously followed at [facility] by Dr. Rosana Hoes.  He was diagnosed with intermediate risk prostate cancer in September 2012 on biopsy, with a single core of Gleason score 3+4 = 7 disease.  Bone scan and CT were negative.  Follow-up MRI showed a highly suspicious 2 cm lesion within the right anterior central gland concerning for prostate cancer with bulging of the capsule.  His PSA continued to rise into the 30s and 40s, and was most recently 43 in June 2019.  From the notes, it appears he was on a watchful waiting approach, as the patient did not want ADT.  He reports he is still sexually active and takes 5 mg Cialis daily.  He has a long history of an atonic bladder and does not void spontaneously.  He performs CIC 3-4 times daily.  He also has CKD with baseline renal function Cr ~1.8, eGFR 40.  He notes intermittent blood and mucus with catheterization, but has had very few UTIs.  He has a number of co-morbidities including CKD, A. fib, diabetes, stroke, PE on Coumadin, and history of subdural hematoma.  He was recently hospitalized in January for stroke.  He has recovered his strength and speech, but he is having some issues with mild confusion still.  He reports 20 pounds of weight loss over the last 6 months.  He denies bone pain.  There are no aggravating or alleviating factors.  Severity is moderate.   PMH: Past Medical History:  Diagnosis Date  . Atrial fibrillation ([place])   . Cancer ([place])   . Chronic kidney disease   . Colon polyp   . Diabetes mellitus   . History of blood clotting disorder   . HTN (hypertension)     Surgical History: Past Surgical History:   Procedure Laterality Date  . AMPUTATION TOE Right 11/29/2018   Procedure: AMPUTATION TOE;  Surgeon: Albertine Patricia, DPM;  Location: [facility] ORS;  Service: Podiatry;  Laterality: Right;  . EYE SURGERY     CATARACTS  . HERNIA REPAIR    . LOWER EXTREMITY ANGIOGRAPHY Right 12/02/2018   Procedure: Lower Extremity Angiography;  Surgeon: Algernon Huxley, MD;  Location: [place] CV LAB;  Service: Cardiovascular;  Laterality: Right;  . SKIN CANCER EXCISION      Allergies: No Known Allergies  Family History: No family history on file.  Social History:  reports that he has never smoked. He has never used smokeless tobacco. He reports that he does not drink alcohol or use drugs.  ROS: Please see flowsheet from today's date for complete review of systems.  Physical Exam: BP 126/73   Pulse 65   Ht 6' (1.829 m)   Wt 220 lb (99.8 kg)   BMI 29.84 kg/m    Constitutional: In wheelchair, conversational Cardiovascular: No clubbing, cyanosis, or edema. Respiratory: Normal respiratory effort, no increased work of breathing. GI: Abdomen is soft, nontender, nondistended, no abdominal masses Skin: No rashes, bruises or suspicious lesions. Neurologic: Grossly intact, no focal deficits, moving all 4 extremities. Psychiatric: Normal mood and affect.  Laboratory Data: Reviewed  Urinalysis today greater than 30 WBCs, 3-10 RBCs, many bacteria, nitrite negative, sent for culture  Pertinent Imaging: None to review  Assessment & Plan:   In summary, the patient is an 85 year old male with multiple comorbidities originally diagnosed with intermediate risk prostate cancer and 2012 who is had a persistently rising PSA.  He was previously followed at [facility].  Last PSA was 60 in June 2019.  He has not undergone any recent cross-sectional imaging.  I had a long conversation with the patient and his son today about prostate cancer and treatment strategies including watchful waiting  versus androgen deprivation therapy.  He is resistant to treatment at this time as he feels that he is asymptomatic and remains sexually active.  I do not think he has a good understanding of his disease process.  I recommended obtaining new staging imaging as he has had no cross-sectional imaging over the last 5 years despite significantly rising PSA before making any treatment decisions.  We also discussed possible referral to medical oncology pending results of imaging studies.  All in all, with his numerous co-morbidities, a watchful waiting approach may still be appropriate for this patient.  -PSA today -NM bone scan and CT A/P without contrast(CKD) for updated staging -Urine sent for culture -Virtual visit in 4 weeks to discuss above results  [place], [place] [address] [place], [place] [address] [phone]

## 2019-06-02 LAB — URINALYSIS, COMPLETE
Bilirubin, UA: NEGATIVE
Glucose, UA: NEGATIVE
Ketones, UA: NEGATIVE
Nitrite, UA: NEGATIVE
Specific Gravity, UA: 1.025 (ref 1.005–1.030)
Urobilinogen, Ur: 0.2 mg/dL (ref 0.2–1.0)
pH, UA: 6 (ref 5.0–7.5)

## 2019-06-02 LAB — MICROSCOPIC EXAMINATION: WBC, UA: >30 /hpf — AB (ref 0–5)

## 2019-06-02 LAB — PSA: Prostate Specific Ag, Serum: 54.2 ng/mL — ABNORMAL HIGH (ref 0.0–4.0)

## 2019-06-05 LAB — CULTURE, URINE COMPREHENSIVE

## 2019-06-23 DIAGNOSIS — E118 Type 2 diabetes mellitus with unspecified complications: Secondary | ICD-10-CM | POA: Diagnosis not present

## 2019-06-23 DIAGNOSIS — M1990 Unspecified osteoarthritis, unspecified site: Secondary | ICD-10-CM | POA: Diagnosis not present

## 2019-06-23 DIAGNOSIS — Z7901 Long term (current) use of anticoagulants: Secondary | ICD-10-CM | POA: Diagnosis not present

## 2019-06-23 DIAGNOSIS — I4891 Unspecified atrial fibrillation: Secondary | ICD-10-CM | POA: Diagnosis not present

## 2019-06-24 DIAGNOSIS — Z1389 Encounter for screening for other disorder: Secondary | ICD-10-CM | POA: Diagnosis not present

## 2019-06-24 DIAGNOSIS — Z0000 Encounter for general adult medical examination without abnormal findings: Secondary | ICD-10-CM | POA: Diagnosis not present

## 2019-06-25 ENCOUNTER — Ambulatory Visit: Payer: Medicare Other

## 2019-06-28 ENCOUNTER — Other Ambulatory Visit: Payer: Medicare Other

## 2019-06-28 ENCOUNTER — Ambulatory Visit
Admission: RE | Admit: 2019-06-28 | Discharge: 2019-06-28 | Disposition: A | Discharge status: Home / Self Care | Payer: Medicare Other | Source: Ambulatory Visit | Attending: Urology | Admitting: Urology

## 2019-06-28 ENCOUNTER — Encounter
Admission: RE | Admit: 2019-06-28 | Discharge: 2019-06-28 | Disposition: A | Discharge status: Home / Self Care | Payer: Medicare Other | Source: Ambulatory Visit | Attending: Urology | Admitting: Urology

## 2019-06-28 ENCOUNTER — Other Ambulatory Visit: Payer: Self-pay

## 2019-06-28 ENCOUNTER — Ambulatory Visit: Payer: Self-pay | Admitting: Urology

## 2019-06-28 DIAGNOSIS — Z7901 Long term (current) use of anticoagulants: Secondary | ICD-10-CM | POA: Diagnosis not present

## 2019-06-28 DIAGNOSIS — K5730 Diverticulosis of large intestine without perforation or abscess without bleeding: Secondary | ICD-10-CM | POA: Diagnosis not present

## 2019-06-28 DIAGNOSIS — C61 Malignant neoplasm of prostate: Secondary | ICD-10-CM | POA: Insufficient documentation

## 2019-06-28 DIAGNOSIS — I480 Paroxysmal atrial fibrillation: Secondary | ICD-10-CM | POA: Diagnosis not present

## 2019-06-28 MED ORDER — TECHNETIUM TC 99M MEDRONATE IV KIT
20.0000 | PACK | Freq: Once | INTRAVENOUS | Status: AC | PRN
  Administered 2019-06-28: 23.982 via INTRAVENOUS

## 2019-06-30 ENCOUNTER — Telehealth: Payer: Medicare Other | Admitting: Urology

## 2019-06-30 ENCOUNTER — Telehealth: Payer: Self-pay | Admitting: Urology

## 2019-06-30 ENCOUNTER — Other Ambulatory Visit: Payer: Self-pay

## 2019-06-30 NOTE — Telephone Encounter (Signed)
LMOM for pt's son to call office to r/s virtual visit

## 2019-06-30 NOTE — Telephone Encounter (Signed)
-----   Message from [place], MD sent at 06/30/2019  3:40 PM EDT ----- Regarding: unable to reach virtual visit Unable to reach by phone for virtual visit, tried home and cell #. Please reschedule virtual visit in next few weeks, non-urgent.  Nickolas Madrid, MD 06/30/2019

## 2019-07-01 ENCOUNTER — Ambulatory Visit: Payer: Medicare Other

## 2019-07-12 ENCOUNTER — Ambulatory Visit: Payer: Self-pay | Admitting: Urology

## 2019-07-26 DIAGNOSIS — I480 Paroxysmal atrial fibrillation: Secondary | ICD-10-CM | POA: Diagnosis not present

## 2019-07-26 DIAGNOSIS — Z7901 Long term (current) use of anticoagulants: Secondary | ICD-10-CM | POA: Diagnosis not present

## 2019-09-06 DIAGNOSIS — I480 Paroxysmal atrial fibrillation: Secondary | ICD-10-CM | POA: Diagnosis not present

## 2019-09-06 DIAGNOSIS — Z7901 Long term (current) use of anticoagulants: Secondary | ICD-10-CM | POA: Diagnosis not present

## 2019-10-04 DIAGNOSIS — Z7901 Long term (current) use of anticoagulants: Secondary | ICD-10-CM | POA: Diagnosis not present

## 2019-10-04 DIAGNOSIS — I480 Paroxysmal atrial fibrillation: Secondary | ICD-10-CM | POA: Diagnosis not present

## 2019-11-01 DIAGNOSIS — I480 Paroxysmal atrial fibrillation: Secondary | ICD-10-CM | POA: Diagnosis not present

## 2019-11-01 DIAGNOSIS — Z7901 Long term (current) use of anticoagulants: Secondary | ICD-10-CM | POA: Diagnosis not present

## 2019-11-12 ENCOUNTER — Emergency Department: Payer: Medicare Other

## 2019-11-12 ENCOUNTER — Observation Stay: Payer: Medicare Other

## 2019-11-12 ENCOUNTER — Other Ambulatory Visit: Payer: Self-pay

## 2019-11-12 ENCOUNTER — Inpatient Hospital Stay
Admission: EM | Admit: 2019-11-12 | Discharge: 2019-11-16 | DRG: 065 | Disposition: A | Discharge status: Home Health | Payer: Medicare Other | Attending: Internal Medicine | Admitting: Internal Medicine

## 2019-11-12 DIAGNOSIS — Z89421 Acquired absence of other right toe(s): Secondary | ICD-10-CM

## 2019-11-12 DIAGNOSIS — B9620 Unspecified Escherichia coli [E. coli] as the cause of diseases classified elsewhere: Secondary | ICD-10-CM | POA: Diagnosis present

## 2019-11-12 DIAGNOSIS — I6340 Cerebral infarction due to embolism of unspecified cerebral artery: Principal | ICD-10-CM | POA: Diagnosis present

## 2019-11-12 DIAGNOSIS — I10 Essential (primary) hypertension: Secondary | ICD-10-CM

## 2019-11-12 DIAGNOSIS — D6851 Activated protein C resistance: Secondary | ICD-10-CM | POA: Diagnosis present

## 2019-11-12 DIAGNOSIS — R4182 Altered mental status, unspecified: Secondary | ICD-10-CM | POA: Diagnosis not present

## 2019-11-12 DIAGNOSIS — I4510 Unspecified right bundle-branch block: Secondary | ICD-10-CM | POA: Diagnosis not present

## 2019-11-12 DIAGNOSIS — Z7984 Long term (current) use of oral hypoglycemic drugs: Secondary | ICD-10-CM

## 2019-11-12 DIAGNOSIS — N179 Acute kidney failure, unspecified: Secondary | ICD-10-CM | POA: Diagnosis not present

## 2019-11-12 DIAGNOSIS — R404 Transient alteration of awareness: Secondary | ICD-10-CM

## 2019-11-12 DIAGNOSIS — N1832 Chronic kidney disease, stage 3b: Secondary | ICD-10-CM | POA: Diagnosis present

## 2019-11-12 DIAGNOSIS — Z20828 Contact with and (suspected) exposure to other viral communicable diseases: Secondary | ICD-10-CM | POA: Diagnosis present

## 2019-11-12 DIAGNOSIS — N189 Chronic kidney disease, unspecified: Secondary | ICD-10-CM | POA: Diagnosis not present

## 2019-11-12 DIAGNOSIS — Z23 Encounter for immunization: Secondary | ICD-10-CM

## 2019-11-12 DIAGNOSIS — I129 Hypertensive chronic kidney disease with stage 1 through stage 4 chronic kidney disease, or unspecified chronic kidney disease: Secondary | ICD-10-CM | POA: Diagnosis present

## 2019-11-12 DIAGNOSIS — Z8673 Personal history of transient ischemic attack (TIA), and cerebral infarction without residual deficits: Secondary | ICD-10-CM

## 2019-11-12 DIAGNOSIS — I4891 Unspecified atrial fibrillation: Secondary | ICD-10-CM | POA: Diagnosis not present

## 2019-11-12 DIAGNOSIS — N183 Chronic kidney disease, stage 3 unspecified: Secondary | ICD-10-CM | POA: Diagnosis present

## 2019-11-12 DIAGNOSIS — Z03818 Encounter for observation for suspected exposure to other biological agents ruled out: Secondary | ICD-10-CM | POA: Diagnosis not present

## 2019-11-12 DIAGNOSIS — E1140 Type 2 diabetes mellitus with diabetic neuropathy, unspecified: Secondary | ICD-10-CM

## 2019-11-12 DIAGNOSIS — E1165 Type 2 diabetes mellitus with hyperglycemia: Secondary | ICD-10-CM | POA: Diagnosis not present

## 2019-11-12 DIAGNOSIS — Z66 Do not resuscitate: Secondary | ICD-10-CM | POA: Diagnosis present

## 2019-11-12 DIAGNOSIS — E785 Hyperlipidemia, unspecified: Secondary | ICD-10-CM | POA: Diagnosis present

## 2019-11-12 DIAGNOSIS — E1122 Type 2 diabetes mellitus with diabetic chronic kidney disease: Secondary | ICD-10-CM | POA: Diagnosis present

## 2019-11-12 DIAGNOSIS — N390 Urinary tract infection, site not specified: Secondary | ICD-10-CM | POA: Diagnosis not present

## 2019-11-12 DIAGNOSIS — R29701 NIHSS score 1: Secondary | ICD-10-CM | POA: Diagnosis present

## 2019-11-12 DIAGNOSIS — B961 Klebsiella pneumoniae [K. pneumoniae] as the cause of diseases classified elsewhere: Secondary | ICD-10-CM | POA: Diagnosis present

## 2019-11-12 DIAGNOSIS — R4020 Unspecified coma: Secondary | ICD-10-CM | POA: Diagnosis not present

## 2019-11-12 DIAGNOSIS — Z86711 Personal history of pulmonary embolism: Secondary | ICD-10-CM

## 2019-11-12 DIAGNOSIS — R410 Disorientation, unspecified: Secondary | ICD-10-CM | POA: Diagnosis not present

## 2019-11-12 DIAGNOSIS — I639 Cerebral infarction, unspecified: Secondary | ICD-10-CM

## 2019-11-12 DIAGNOSIS — N319 Neuromuscular dysfunction of bladder, unspecified: Secondary | ICD-10-CM | POA: Diagnosis present

## 2019-11-12 LAB — URINALYSIS, COMPLETE (UACMP) WITH MICROSCOPIC
Bilirubin Urine: NEGATIVE
Glucose, UA: NEGATIVE mg/dL
Hgb urine dipstick: NEGATIVE
Ketones, ur: NEGATIVE mg/dL
Nitrite: NEGATIVE
Protein, ur: 100 mg/dL — AB
Specific Gravity, Urine: 1.015 (ref 1.005–1.030)
WBC, UA: >50 WBC/hpf — ABNORMAL HIGH (ref 0–5)
pH: 7 (ref 5.0–8.0)

## 2019-11-12 LAB — COMPREHENSIVE METABOLIC PANEL
ALT: 15 U/L (ref 0–44)
AST: 25 U/L (ref 15–41)
Albumin: 3 g/dL — ABNORMAL LOW (ref 3.5–5.0)
Alkaline Phosphatase: 83 U/L (ref 38–126)
Anion gap: 10 (ref 5–15)
BUN: 50 mg/dL — ABNORMAL HIGH (ref 8–23)
CO2: 22 mmol/L (ref 22–32)
Calcium: 8.6 mg/dL — ABNORMAL LOW (ref 8.9–10.3)
Chloride: 105 mmol/L (ref 98–111)
Creatinine, Ser: 2.27 mg/dL — ABNORMAL HIGH (ref 0.61–1.24)
GFR calc Af Amer: 29 mL/min — ABNORMAL LOW (ref >60)
GFR calc non Af Amer: 25 mL/min — ABNORMAL LOW (ref >60)
Glucose, Bld: 262 mg/dL — ABNORMAL HIGH (ref 70–99)
Potassium: 4.9 mmol/L (ref 3.5–5.1)
Sodium: 137 mmol/L (ref 135–145)
Total Bilirubin: 0.7 mg/dL (ref 0.3–1.2)
Total Protein: 7.1 g/dL (ref 6.5–8.1)

## 2019-11-12 LAB — CBC WITH DIFFERENTIAL/PLATELET
Abs Immature Granulocytes: 0.05 10*3/uL (ref 0.00–0.07)
Basophils Absolute: 0.1 10*3/uL (ref 0.0–0.1)
Basophils Relative: 1 %
Eosinophils Absolute: 0.3 10*3/uL (ref 0.0–0.5)
Eosinophils Relative: 3 %
HCT: 45.2 % (ref 39.0–52.0)
Hemoglobin: 15.4 g/dL (ref 13.0–17.0)
Immature Granulocytes: 1 %
Lymphocytes Relative: 21 %
Lymphs Abs: 2 10*3/uL (ref 0.7–4.0)
MCH: 32.1 pg (ref 26.0–34.0)
MCHC: 34.1 g/dL (ref 30.0–36.0)
MCV: 94.2 fL (ref 80.0–100.0)
Monocytes Absolute: 0.9 10*3/uL (ref 0.1–1.0)
Monocytes Relative: 10 %
Neutro Abs: 6.2 10*3/uL (ref 1.7–7.7)
Neutrophils Relative %: 64 %
Platelets: 177 10*3/uL (ref 150–400)
RBC: 4.8 MIL/uL (ref 4.22–5.81)
RDW: 13.2 % (ref 11.5–15.5)
WBC: 9.6 10*3/uL (ref 4.0–10.5)
nRBC: 0 % (ref 0.0–0.2)

## 2019-11-12 LAB — PROTIME-INR
INR: 2.1 — ABNORMAL HIGH (ref 0.8–1.2)
Prothrombin Time: 23.5 seconds — ABNORMAL HIGH (ref 11.4–15.2)

## 2019-11-12 LAB — TSH: TSH: 1.518 u[IU]/mL (ref 0.350–4.500)

## 2019-11-12 LAB — LACTIC ACID, PLASMA
Lactic Acid, Venous: 2.3 mmol/L (ref 0.5–1.9)
Lactic Acid, Venous: 1.7 mmol/L (ref 0.5–1.9)

## 2019-11-12 LAB — TROPONIN I (HIGH SENSITIVITY)
Troponin I (High Sensitivity): 16 ng/L (ref <18)
Troponin I (High Sensitivity): 16 ng/L (ref <18)

## 2019-11-12 LAB — CK: Total CK: 72 U/L (ref 49–397)

## 2019-11-12 MED ORDER — LACOSAMIDE 50 MG PO TABS
100.0000 mg | ORAL_TABLET | Freq: Two times a day (BID) | ORAL | Status: DC
  Administered 2019-11-12 – 2019-11-16 (×8): 100 mg via ORAL
  Filled 2019-11-12 (×8): qty 2

## 2019-11-12 MED ORDER — SODIUM CHLORIDE 0.9 % IV SOLN
1.0000 g | INTRAVENOUS | Status: DC
  Administered 2019-11-13 – 2019-11-16 (×4): 1 g via INTRAVENOUS
  Filled 2019-11-12 (×4): qty 1

## 2019-11-12 MED ORDER — STROKE: EARLY STAGES OF RECOVERY BOOK
Freq: Once | Status: AC
  Administered 2019-11-12: 18:00:00

## 2019-11-12 MED ORDER — VITAMIN D 25 MCG (1000 UNIT) PO TABS
2000.0000 [IU] | ORAL_TABLET | Freq: Every day | ORAL | Status: DC
  Administered 2019-11-13 – 2019-11-16 (×4): 2000 [IU] via ORAL
  Filled 2019-11-12 (×4): qty 2

## 2019-11-12 MED ORDER — ACETAMINOPHEN 160 MG/5ML PO SOLN
650.0000 mg | ORAL | Status: DC | PRN
  Filled 2019-11-12: qty 20.3

## 2019-11-12 MED ORDER — INFLUENZA VAC A&B SA ADJ QUAD 0.5 ML IM PRSY
0.5000 mL | PREFILLED_SYRINGE | INTRAMUSCULAR | Status: AC
  Administered 2019-11-13: 09:00:00 0.5 mL via INTRAMUSCULAR
  Filled 2019-11-12: qty 0.5

## 2019-11-12 MED ORDER — ACETAMINOPHEN 650 MG RE SUPP: 650.0000 mg | RECTAL | Status: DC | PRN

## 2019-11-12 MED ORDER — FEBUXOSTAT 40 MG PO TABS
80.0000 mg | ORAL_TABLET | Freq: Every day | ORAL | Status: DC
  Administered 2019-11-13 – 2019-11-16 (×4): 80 mg via ORAL
  Filled 2019-11-12 (×4): qty 2

## 2019-11-12 MED ORDER — OMEGA-3-ACID ETHYL ESTERS 1 G PO CAPS
1000.0000 mg | ORAL_CAPSULE | Freq: Every day | ORAL | Status: DC
  Administered 2019-11-13 – 2019-11-16 (×4): 1000 mg via ORAL
  Filled 2019-11-12 (×4): qty 1

## 2019-11-12 MED ORDER — ASPIRIN 325 MG PO TABS
325.0000 mg | ORAL_TABLET | Freq: Every day | ORAL | Status: DC
  Administered 2019-11-12 – 2019-11-13 (×2): 325 mg via ORAL
  Filled 2019-11-12 (×2): qty 1

## 2019-11-12 MED ORDER — SENNOSIDES-DOCUSATE SODIUM 8.6-50 MG PO TABS
1.0000 | ORAL_TABLET | Freq: Two times a day (BID) | ORAL | Status: DC
  Administered 2019-11-12 – 2019-11-16 (×8): 1 via ORAL
  Filled 2019-11-12 (×8): qty 1

## 2019-11-12 MED ORDER — SODIUM CHLORIDE 0.9 % IV BOLUS
1000.0000 mL | Freq: Once | INTRAVENOUS | Status: AC
  Administered 2019-11-12: 1000 mL via INTRAVENOUS

## 2019-11-12 MED ORDER — ATORVASTATIN CALCIUM 10 MG PO TABS
10.0000 mg | ORAL_TABLET | Freq: Every day | ORAL | Status: DC
  Administered 2019-11-12 – 2019-11-13 (×2): 10 mg via ORAL
  Filled 2019-11-12 (×2): qty 1

## 2019-11-12 MED ORDER — PNEUMOCOCCAL VAC POLYVALENT 25 MCG/0.5ML IJ INJ
0.5000 mL | INJECTION | INTRAMUSCULAR | Status: AC
  Administered 2019-11-13: 09:00:00 0.5 mL via INTRAMUSCULAR
  Filled 2019-11-12: qty 0.5

## 2019-11-12 MED ORDER — SODIUM CHLORIDE 0.9 % IV SOLN
1.0000 g | Freq: Once | INTRAVENOUS | Status: AC
  Administered 2019-11-12: 1 g via INTRAVENOUS
  Filled 2019-11-12: qty 10

## 2019-11-12 MED ORDER — ACETAMINOPHEN 325 MG PO TABS
650.0000 mg | ORAL_TABLET | ORAL | Status: DC | PRN
  Filled 2019-11-12: qty 2

## 2019-11-12 MED ORDER — SODIUM CHLORIDE 0.9 % IV SOLN
INTRAVENOUS | Status: DC
  Administered 2019-11-12 – 2019-11-16 (×7): via INTRAVENOUS

## 2019-11-12 MED ORDER — METOPROLOL TARTRATE 25 MG PO TABS
12.5000 mg | ORAL_TABLET | Freq: Two times a day (BID) | ORAL | Status: DC
  Administered 2019-11-12 – 2019-11-16 (×8): 12.5 mg via ORAL
  Filled 2019-11-12 (×8): qty 1

## 2019-11-12 MED ORDER — ASPIRIN 300 MG RE SUPP
300.0000 mg | Freq: Every day | RECTAL | Status: DC
  Filled 2019-11-12 (×2): qty 1

## 2019-11-12 NOTE — ED Notes (Signed)
ED TO INPATIENT HANDOFF REPORT  ED Nurse Name and Phone #: Benay Pillow [phone]  S Name/Age/Gender Richard [name] 85 y.o. male Room/Bed: ED26A/ED26A  Code Status   Code Status: Prior  Home/SNF/Other Home Patient oriented to: self, place,  Is this baseline? No   Triage Complete: Triage complete  Chief Complaint altered mental status  Triage Note Pt to the ER via ems for AMS, pt was found in the garage by family. Unknown time of being altered. Pt had a stroke 14 months ago and family is concerned it is another stroke. Pt is unable to answer orientation questions. Denies pain. When asked questions, he answers with more word salad.   Allergies No Known Allergies  Level of Care/Admitting Diagnosis ED Disposition    ED Disposition Condition [facility] Area: [place] [100120]  Level of Care: Med-Surg [16]  Covid Evaluation: Asymptomatic Screening Protocol (No Symptoms)  Diagnosis: Altered mental status [780.97.ICD-9-CM]  Admitting Physician: Max Sane [[id number]]  Attending Physician: Max Sane [[id number]]  PT Class (Do Not Modify): Observation [104]  PT Acc Code (Do Not Modify): Observation [10022]       B Medical/Surgery History Past Medical History:  Diagnosis Date  . Atrial fibrillation ([place])   . Cancer ([place])   . Chronic kidney disease   . Colon polyp   . Diabetes mellitus   . History of blood clotting disorder   . HTN (hypertension)    Past Surgical History:  Procedure Laterality Date  . AMPUTATION TOE Right 11/29/2018   Procedure: AMPUTATION TOE;  Surgeon: Albertine Patricia, DPM;  Location: [facility] ORS;  Service: Podiatry;  Laterality: Right;  . EYE SURGERY     CATARACTS  . HERNIA REPAIR    . LOWER EXTREMITY ANGIOGRAPHY Right 12/02/2018   Procedure: Lower Extremity Angiography;  Surgeon: Algernon Huxley, MD;  Location: [facility];  Service: Cardiovascular;  Laterality: Right;  . SKIN CANCER EXCISION        A IV Location/Drains/Wounds Patient Lines/Drains/Airways Status   Active Line/Drains/Airways    Name:   Placement date:   Placement time:   Site:   Days:   Peripheral IV 11/12/19 Left Wrist   11/12/19    1538    Wrist   less than 1   PICC Single Lumen 11/30/18 PICC Right Brachial 45 cm 0 cm   11/30/18    1655    Brachial   347   Pressure Injury 11/28/18 Stage II -  Partial thickness loss of dermis presenting as a shallow open ulcer with a red, pink wound bed without slough. this is a partial thickness lesion of unknown etiology, NOT a pressure injury   11/28/18    1858     349   Pressure Injury 12/29/18 Unstageable - Full thickness tissue loss in which the base of the ulcer is covered by slough (yellow, tan, gray, green or brown) and/or eschar (tan, brown or black) in the wound bed. dry crusted healing pressure ulcer   12/29/18    0054     318   Pressure Injury 12/29/18 Unstageable - Full thickness tissue loss in which the base of the ulcer is covered by slough (yellow, tan, gray, green or brown) and/or eschar (tan, brown or black) in the wound bed.   12/29/18    0054     318   Wound / Incision (Open or Dehisced) 12/29/18 Other (Comment) Toe (Comment  which one) Anterior;Right recent amputation  12/29/18    1623    Toe (Comment  which one)   318          Intake/Output Last 24 hours No intake or output data in the 24 hours ending 11/12/19 1751  Labs/Imaging Results for orders placed or performed during the hospital encounter of 11/12/19 (from the past 48 hour(s))  Comprehensive metabolic panel     Status: Abnormal   Collection Time: 11/12/19  3:31 PM  Result Value Ref Range   Sodium 137 135 - 145 mmol/L   Potassium 4.9 3.5 - 5.1 mmol/L    Comment: HEMOLYSIS AT THIS LEVEL MAY AFFECT RESULT   Chloride 105 98 - 111 mmol/L   CO2 22 22 - 32 mmol/L   Glucose, Bld 262 (H) 70 - 99 mg/dL   BUN 50 (H) 8 - 23 mg/dL   Creatinine, Ser 2.27 (H) 0.61 - 1.24 mg/dL   Calcium 8.6 (L) 8.9 - 10.3  mg/dL   Total Protein 7.1 6.5 - 8.1 g/dL   Albumin 3.0 (L) 3.5 - 5.0 g/dL   AST 25 15 - 41 U/L   ALT 15 0 - 44 U/L   Alkaline Phosphatase 83 38 - 126 U/L   Total Bilirubin 0.7 0.3 - 1.2 mg/dL   GFR calc non Af Amer 25 (L) >60 mL/min   GFR calc Af Amer 29 (L) >60 mL/min   Anion gap 10 5 - 15    Comment: Performed at [facility], [address] [place], [place] [address]  CBC with Differential     Status: None   Collection Time: 11/12/19  3:31 PM  Result Value Ref Range   WBC 9.6 4.0 - 10.5 K/uL   RBC 4.80 4.22 - 5.81 MIL/uL   Hemoglobin 15.4 13.0 - 17.0 g/dL   HCT 45.2 39.0 - 52.0 %   MCV 94.2 80.0 - 100.0 fL   MCH 32.1 26.0 - 34.0 pg   MCHC 34.1 30.0 - 36.0 g/dL   RDW 13.2 11.5 - 15.5 %   Platelets 177 150 - 400 K/uL   nRBC 0.0 0.0 - 0.2 %   Neutrophils Relative % 64 %   Neutro Abs 6.2 1.7 - 7.7 K/uL   Lymphocytes Relative 21 %   Lymphs Abs 2.0 0.7 - 4.0 K/uL   Monocytes Relative 10 %   Monocytes Absolute 0.9 0.1 - 1.0 K/uL   Eosinophils Relative 3 %   Eosinophils Absolute 0.3 0.0 - 0.5 K/uL   Basophils Relative 1 %   Basophils Absolute 0.1 0.0 - 0.1 K/uL   Immature Granulocytes 1 %   Abs Immature Granulocytes 0.05 0.00 - 0.07 K/uL    Comment: Performed at [facility], [address] [place], [place] [address]  Lactic acid, plasma     Status: Abnormal   Collection Time: 11/12/19  3:31 PM  Result Value Ref Range   Lactic Acid, Venous 2.3 (HH) 0.5 - 1.9 mmol/L    Comment: CRITICAL RESULT CALLED TO, READ BACK BY AND VERIFIED WITH Addilyne Backs AT 1617 11/12/2019.PMF Performed at [facility], [address] [place],  [address]   Troponin I (High Sensitivity)     Status: None   Collection Time: 11/12/19  3:31 PM  Result Value Ref Range   Troponin I (High Sensitivity) 16 <18 ng/L    Comment: (NOTE) Elevated high sensitivity troponin I (hsTnI) values and significant  changes across serial measurements may suggest ACS but many  other  chronic  and acute conditions are known to elevate hsTnI results.  Refer to the "Links" section for chest pain algorithms and additional  guidance. Performed at [facility], [address] [place], [place] [address]   CK     Status: None   Collection Time: 11/12/19  3:31 PM  Result Value Ref Range   Total CK 72 49 - 397 U/L    Comment: Performed at [facility], [address] [place], [place] [address]  Protime-INR     Status: Abnormal   Collection Time: 11/12/19  3:31 PM  Result Value Ref Range   Prothrombin Time 23.5 (H) 11.4 - 15.2 seconds   INR 2.1 (H) 0.8 - 1.2    Comment: (NOTE) INR goal varies based on device and disease states. Performed at [facility], [address] [place], [place] [address]   Urinalysis, Complete w Microscopic     Status: Abnormal   Collection Time: 11/12/19  3:32 PM  Result Value Ref Range   Color, Urine AMBER (A) YELLOW    Comment: BIOCHEMICALS MAY BE AFFECTED BY COLOR   APPearance CLOUDY (A) CLEAR   Specific Gravity, Urine 1.015 1.005 - 1.030   pH 7.0 5.0 - 8.0   Glucose, UA NEGATIVE NEGATIVE mg/dL   Hgb urine dipstick NEGATIVE NEGATIVE   Bilirubin Urine NEGATIVE NEGATIVE   Ketones, ur NEGATIVE NEGATIVE mg/dL   Protein, ur 100 (A) NEGATIVE mg/dL   Nitrite NEGATIVE NEGATIVE   Leukocytes,Ua MODERATE (A) NEGATIVE   RBC / HPF 11-20 0 - 5 RBC/hpf   WBC, UA >50 (H) 0 - 5 WBC/hpf   Bacteria, UA MANY (A) NONE SEEN   Squamous Epithelial / LPF 0-5 0 - 5   WBC Clumps PRESENT    Mucus PRESENT     Comment: Performed at [facility], [address] [place],  [address]   Ct Head Wo Contrast  Result Date: 11/12/2019 CLINICAL DATA:  Altered level of consciousness (LOC), unexplained EXAM: CT HEAD WITHOUT CONTRAST TECHNIQUE: Contiguous axial images were obtained from the base of the skull through the vertex without intravenous contrast. COMPARISON:  Head CT and brain MRI 12/30/2018 FINDINGS:  Brain: Previous right subdural hematoma has diminished in size, small residual chronic subdural collection measures 3 mm in the parietal frontal region. No hyperdensity to suggest acute hemorrhage component. Unchanged generalized atrophy and chronic small vessel ischemia. No evidence of acute infarct. No hydrocephalus or midline shift. Vascular: Atherosclerosis of skullbase vasculature without hyperdense vessel or abnormal calcification. Skull: No fracture or focal lesion. Sinuses/Orbits: Mild mucosal thickening with small fluid levels in the right and left sphenoid sinus. Remaining visualized paranasal sinuses are clear. Opacification of lower left and right mastoid air cells, unchanged. Other: None. IMPRESSION: 1.  No acute intracranial abnormality. 2. Small right chronic subdural hematoma, diminished in size since prior imaging. No acute component, midline shift or mass effect. 3. Stable atrophy and chronic small vessel ischemia. 4. Mucosal thickening with small fluid levels in the sphenoid sinus, can be seen with acute sinusitis in the appropriate clinical setting. Electronically Signed   By: Keith Rake M.D.   On: 11/12/2019 15:47    Pending Labs Unresulted Labs (From admission, onward)    Start     Ordered   11/12/19 1726  TSH  Add-on,   AD     11/12/19 1725   11/12/19 1726  Hemoglobin A1c  Add-on,   AD     11/12/19 1725   11/12/19 1642  SARS CORONAVIRUS 2 (TAT 6-24 HRS) Nasopharyngeal Nasopharyngeal Swab  (Asymptomatic/Tier 3)  Once,   STAT    Question Answer Comment  Is this test for diagnosis or screening Screening   Symptomatic for COVID-19 as defined by CDC No   Hospitalized for COVID-19 No   Admitted to ICU for COVID-19 No   Previously tested for COVID-19 No   Resident in a congregate (group) care setting No   Employed in healthcare setting No      11/12/19 1641   11/12/19 1524  Lactic acid, plasma  Now then every 2 hours,   STAT     11/12/19 1523   Signed and Held  Hemoglobin  A1c  Tomorrow morning,   R     Signed and Held   Signed and Held  Lipid panel  Tomorrow morning,   R    Comments: Fasting    Signed and Held          Vitals/Pain Today's Vitals   11/12/19 1513 11/12/19 1600 11/12/19 1630  BP: (!) 151/78 (!) 165/87 (!) 172/69  Pulse: 65 61 67  Resp: 18 (!) 21 (!) 26  Temp: 98 F (36.7 C)    TempSrc: Oral    SpO2: 100% 99% 100%  PainSc: 0-No pain      Isolation Precautions No active isolations  Medications Medications  sodium chloride 0.9 % bolus 1,000 mL (0 mLs Intravenous Stopped 11/12/19 1749)  cefTRIAXone (ROCEPHIN) 1 g in sodium chloride 0.9 % 100 mL IVPB (0 g Intravenous Stopped 11/12/19 1749)    Mobility walks with person assist Low fall risk   Focused Assessments Neuro Assessment Handoff:  Swallow screen pass? Cardiac Rhythm: Atrial fibrillation       Neuro Assessment: Exceptions to WDL      R Recommendations: See Admitting Provider Note  Report given to:   Additional Notes:  Pt is A & O x 2.

## 2019-11-12 NOTE — ED Provider Notes (Signed)
[facility] Emergency Department Provider Note ____________________________________________   First MD Initiated Contact with Patient 11/12/19 1508     (approximate)  I have reviewed the triage vital signs and the nursing notes.   HISTORY  Chief Complaint Altered Mental Status  Level 5 caveat: History of present illness limited due to altered mental status  HPI Richard [name] is a 85 y.o. male with PMH as noted below who presents with altered mental status, acute onset today.  The patient was found in the garage by his family and appeared confused.  It is unclear per EMS when he was last seen at his baseline, and exactly what his baseline mental status is.  Per EMS, he had a history of a stroke last year and the family was concerned about another stroke.  The patient was able to tell me that he is in the hospital because he might be having another stroke, and he denies any pain or other acute complaints.  Past Medical History:  Diagnosis Date  . Atrial fibrillation ([place])   . Cancer ([place])   . Chronic kidney disease   . Colon polyp   . Diabetes mellitus   . History of blood clotting disorder   . HTN (hypertension)     Patient Active Problem List   Diagnosis Date Noted  . Altered mental status 11/12/2019  . Subdural hematoma ([place]) 12/28/2018  . PAF (paroxysmal atrial fibrillation) ([place]) 12/28/2018  . Diabetes ([place]) 12/28/2018  . CKD (chronic kidney disease), stage III 12/28/2018  . HTN (hypertension) 12/28/2018  . Pressure injury of skin 11/30/2018  . Acute osteomyelitis of toe of right foot ([place]) 11/28/2018  . Malignant tumor of prostate ([place]) 10/26/2015  . Urinary tract infection with hematuria 01/11/2015  . Bladder neck obstruction 11/04/2012  . 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 Prostate cancer ([place]) 11/04/2012  . Retention of urine 05/06/2012  . History of hematuria 01/30/2012  . Malignant neoplasm of prostate ([place]) 09/30/2011  . Gross  hematuria 09/19/2011  . Benign prostatic hyperplasia with urinary retention 09/10/2011  . Elevated prostate specific antigen (PSA) 09/10/2011    Past Surgical History:  Procedure Laterality Date  . AMPUTATION TOE Right 11/29/2018   Procedure: AMPUTATION TOE;  Surgeon: Albertine Patricia, DPM;  Location: [facility] ORS;  Service: Podiatry;  Laterality: Right;  . EYE SURGERY     CATARACTS  . HERNIA REPAIR    . LOWER EXTREMITY ANGIOGRAPHY Right 12/02/2018   Procedure: Lower Extremity Angiography;  Surgeon: Algernon Huxley, MD;  Location: [facility];  Service: Cardiovascular;  Laterality: Right;  . SKIN CANCER EXCISION      Prior to Admission medications   Medication Sig Start Date End Date Taking? Authorizing Provider  Alpha-Lipoic Acid 200 MG CAPS Take 1 capsule by mouth daily.    Yes [provider]  Calcium Carbonate (CALCIUM 600 PO) Take 1 tablet by mouth daily.    Yes [provider]  Cholecalciferol (VITAMIN D) 2000 UNITS tablet Take 2,000 Units by mouth daily.     Yes [provider]  CIALIS 5 MG tablet TK 1/2 T PO QD 06/16/17  Yes [provider]  febuxostat (ULORIC) 40 MG tablet Take 1 tablet by mouth daily.    Yes [provider]  glimepiride (AMARYL) 4 MG tablet Take 4 mg by mouth daily with breakfast.  06/15/17  Yes [provider]  lisinopril (PRINIVIL,ZESTRIL) 5 MG tablet Take 5 mg by mouth daily.  Yes [provider]  metoprolol tartrate (LOPRESSOR) 25 MG tablet Take 0.5 tablets (12.5 mg total) by mouth 2 (two) times daily. 12/30/18  Yes Demetrios Loll, MD  Multiple Vitamins-Minerals (MULTIVITAMIN ADULT EXTRA C PO) Take 1 tablet by mouth daily.    Yes [provider]  Red Yeast Rice 600 MG TABS Take 1 tablet by mouth daily.    Yes [provider]  senna-docusate (SENOKOT-S) 8.6-50 MG tablet Take 1 tablet by mouth 2 (two) times daily. 01/04/19  Yes Gladstone Lighter, MD  warfarin (COUMADIN) 5 MG tablet  Take 5 mg by mouth daily. 09/22/19  Yes [provider]  ALPRAZolam (XANAX) 0.25 MG tablet Take 0.25 mg by mouth at bedtime as needed.      [provider]  atorvastatin (LIPITOR) 10 MG tablet Take 10 mg by mouth daily.      [provider]  Coenzyme Q10 10 MG capsule Take 10 mg by mouth.    [provider]  colchicine 0.6 MG tablet Take 0.6 mg by mouth.    [provider]  lacosamide 100 MG TABS Take 1 tablet (100 mg total) by mouth 2 (two) times daily. Patient not taking: Reported on 11/12/2019 01/04/19   Gladstone Lighter, MD  nystatin-triamcinolone [facility] II) cream Apply to affected areas bid 05/08/16   [provider]  Vitamins A & D (VITAMIN A & D) [address] UNITS TABS Take 1 tablet by mouth daily.     [provider]    Allergies Patient has no known allergies.  No family history on file.  Social History Social History   Tobacco Use  . Smoking status: Never Smoker  . Smokeless tobacco: Never Used  Substance Use Topics  . Alcohol use: No  . Drug use: Never    Review of Systems Level 5 caveat: Unable to obtain review of systems due to altered mental status    ____________________________________________   PHYSICAL EXAM:  VITAL SIGNS: ED Triage Vitals [11/12/19 1513]  Enc Vitals Group     BP (!) 151/78     Pulse Rate 65     Resp 18     Temp 98 F (36.7 C)     Temp Source Oral     SpO2 100 %     Weight      Height      Head Circumference      Peak Flow      Pain Score 0     Pain Loc      Pain Edu?      Excl. in [place]?     Constitutional: Alert, oriented x2 (does not know the year or date).  Relatively comfortable appearing and in no acute distress. Eyes: Conjunctivae are normal.  EOMI.  PERRLA. Head: Atraumatic. Nose: No congestion/rhinnorhea. Mouth/Throat: Mucous membranes are slightly dry.   Neck: Normal range of motion.  Cardiovascular: Normal rate, regular rhythm. Good peripheral  circulation. Respiratory: Normal respiratory effort.  No retractions.  Gastrointestinal: Soft and nontender. No distention.  Genitourinary: No flank tenderness. Musculoskeletal: No lower extremity edema.  Extremities warm and well perfused.  Neurologic:  Normal speech and language.  Motor intact in all extremities.  No pronator drift.  Normal coordination.  No facial droop. Skin:  Skin is warm and dry. No rash noted. Psychiatric: Calm and cooperative  ____________________________________________   LABS (all labs ordered are listed, but only abnormal results are displayed)  Labs Reviewed  COMPREHENSIVE METABOLIC PANEL - Abnormal; Notable for the following  components:      Result Value   Glucose, Bld 262 (*)    BUN 50 (*)    Creatinine, Ser 2.27 (*)    Calcium 8.6 (*)    Albumin 3.0 (*)    GFR calc non Af Amer 25 (*)    GFR calc Af Amer 29 (*)    All other components within normal limits  LACTIC ACID, PLASMA - Abnormal; Notable for the following components:   Lactic Acid, Venous 2.3 (*)    All other components within normal limits  URINALYSIS, COMPLETE (UACMP) WITH MICROSCOPIC - Abnormal; Notable for the following components:   Color, Urine AMBER (*)    APPearance CLOUDY (*)    Protein, ur 100 (*)    Leukocytes,Ua MODERATE (*)    WBC, UA >50 (*)    Bacteria, UA MANY (*)    All other components within normal limits  PROTIME-INR - Abnormal; Notable for the following components:   Prothrombin Time 23.5 (*)    INR 2.1 (*)    All other components within normal limits  SARS CORONAVIRUS 2 (TAT 6-24 HRS)  CBC WITH DIFFERENTIAL/PLATELET  LACTIC ACID, PLASMA  CK  TSH  HEMOGLOBIN A1C  HEMOGLOBIN A1C  LIPID PANEL  TROPONIN I (HIGH SENSITIVITY)  TROPONIN I (HIGH SENSITIVITY)   ____________________________________________  EKG  ED ECG REPORT I, Arta Silence, the attending physician, personally viewed and interpreted this ECG.  Date: 11/12/2019 EKG Time: 1530 Rate: 63  Rhythm: Atrial fibrillation QRS Axis: normal Intervals: RBBB, LAFB ST/T Wave abnormalities: normal Narrative Interpretation: no evidence of acute ischemia  ____________________________________________  RADIOLOGY  CT head: No acute abnormality  ____________________________________________   PROCEDURES  Procedure(s) performed: No  Procedures  Critical Care performed: No ____________________________________________   INITIAL IMPRESSION / ASSESSMENT AND PLAN / ED COURSE  Pertinent labs & imaging results that were available during my care of the patient were reviewed by me and considered in my medical decision making (see chart for details).  85 year old male with PMH as noted above presents with altered mental status, acute onset today when he was found by the family in the garage.  The last time he was seen at his baseline, and exactly what his baseline mental status is are unclear from the history available from EMS.  I reviewed the past medical records in [place].  The patient was most recent admitted in January with a spontaneous subdural hematoma related to being on Coumadin for his atrial fibrillation.  On exam, the patient is alert.  He is oriented x2 and able to follow commands and answer some questions.  He does appear somewhat confused, but during my exam was speaking in clear sentences and did not demonstrate aphasia.  Neurologic exam is otherwise nonfocal.  The remainder of the exam is as described above.  Given the patient's mental status, I have a lower suspicion for acute stroke or intracranial hemorrhage.  Differential also includes UTI or other infection, dehydration or other metabolic etiology, or less likely cardiac cause.  We will obtain CT head, lab work-up, and reassess.  ----------------------------------------- 10:54 PM on 11/12/2019 -----------------------------------------  CT head was negative.  The work-up showed findings consistent with a UTI.  Given  the patient's altered mental status, I ordered IV antibiotics and admitted him to the hospitalist service.  I discussed this case with Dr. Manuella Ghazi.  ____________________________________________   FINAL CLINICAL IMPRESSION(S) / ED DIAGNOSES  Final diagnoses:  Altered mental status, unspecified altered mental status type  Urinary tract  infection without hematuria, site unspecified      NEW MEDICATIONS STARTED DURING THIS VISIT:  Current Discharge Medication List       Note:  This document was prepared using Dragon voice recognition software and may include unintentional dictation errors.   Arta Silence, MD 11/12/19 [phone]

## 2019-11-12 NOTE — Progress Notes (Signed)
Spoke with patient's son [name] ) and password set up.

## 2019-11-12 NOTE — Progress Notes (Signed)
Pt admitted from ED near end of shift. Essential care and assessment done. Oncoming nurse will FU on diet, pt profile and meds. Pt reports he took "all of my meds" at home this morning but unable to remember what he took. No family accompanied pt.  Pt  Is slow to retrieve information such as "close to [place] day" and then said "watermelon and cantalopes". States he is in [place] and then at the hospital- but unclear what the name was. Slow to retrieve picture names of cautus and unable to identify hammock but knew it was to lie on. Stated he was 42 instead of 88.

## 2019-11-12 NOTE — ED Notes (Signed)
Family at bedside. 

## 2019-11-12 NOTE — ED Triage Notes (Signed)
Pt to the ER via ems for AMS, pt was found in the garage by family. Unknown time of being altered. Pt had a stroke 14 months ago and family is concerned it is another stroke. Pt is unable to answer orientation questions. Denies pain. When asked questions, he answers with more word salad.

## 2019-11-12 NOTE — H&P (Signed)
[facility] NAME: Shown Dissinger    MR#:  [id number]  DATE OF BIRTH:  19-Jun-1931  DATE OF ADMISSION:  11/12/2019  PRIMARY CARE PHYSICIAN: [name], MD   REQUESTING/REFERRING PHYSICIAN: Arta Silence, MD  CHIEF COMPLAINT:   Chief Complaint  Patient presents with  . Altered Mental Status    HISTORY OF PRESENT ILLNESS:  Richard [name]  is a 85 y.o. male with a known history of atrial fibrillation on Coumadin, diabetes, hypertension is being admitted for altered mental status, acute onset today.  The patient was found in the garage by his family and appeared confused.  Per patient's daughter this is first-time and probably lasted about 10 to 15 minutes at the most. Per EMS, he had a history of a stroke last year and the family was concerned about another stroke.  The patient was able to tell me that he is in the hospital because he might be having another stroke, and he denies any pain or other acute complaints.  Daughter is at the bedside and is providing most of the information. PAST MEDICAL HISTORY:   Past Medical History:  Diagnosis Date  . Atrial fibrillation ([place])   . Cancer ([place])   . Chronic kidney disease   . Colon polyp   . Diabetes mellitus   . History of blood clotting disorder   . HTN (hypertension)     PAST SURGICAL HISTORY:   Past Surgical History:  Procedure Laterality Date  . AMPUTATION TOE Right 11/29/2018   Procedure: AMPUTATION TOE;  Surgeon: Albertine Patricia, DPM;  Location: [facility];  Service: Podiatry;  Laterality: Right;  . EYE SURGERY     CATARACTS  . HERNIA REPAIR    . LOWER EXTREMITY ANGIOGRAPHY Right 12/02/2018   Procedure: Lower Extremity Angiography;  Surgeon: Algernon Huxley, MD;  Location: [place] [facility];  Service: Cardiovascular;  Laterality: Right;  . SKIN CANCER EXCISION      SOCIAL HISTORY:   Social History   Tobacco Use  . Smoking status: Never Smoker  . Smokeless tobacco: Never  Used  Substance Use Topics  . Alcohol use: No    FAMILY HISTORY:  No family history on file. Not significant DRUG ALLERGIES:  No Known Allergies  REVIEW OF SYSTEMS:   Review of Systems  Constitutional: Negative for diaphoresis, fever, malaise/fatigue and weight loss.  HENT: Negative for ear discharge, ear pain, hearing loss, nosebleeds, sore throat and tinnitus.   Eyes: Negative for blurred vision and pain.  Respiratory: Negative for cough, hemoptysis, shortness of breath and wheezing.   Cardiovascular: Negative for chest pain, palpitations, orthopnea and leg swelling.  Gastrointestinal: Negative for abdominal pain, blood in stool, constipation, diarrhea, heartburn, nausea and vomiting.  Genitourinary: Negative for dysuria, frequency and urgency.  Musculoskeletal: Negative for back pain and myalgias.  Skin: Negative for itching and rash.  Neurological: Negative for dizziness, tingling, tremors, focal weakness, seizures, weakness and headaches.  Psychiatric/Behavioral: Negative for depression. The patient is not nervous/anxious.      MEDICATIONS AT HOME:   Prior to Admission medications   Medication Sig Start Date End Date Taking? Authorizing Provider  Alpha-Lipoic Acid 200 MG CAPS Take 1 capsule by mouth daily.     [provider]  ALPRAZolam Duanne Moron) 0.25 MG tablet Take 0.25 mg by mouth at bedtime as needed.      [provider]  atorvastatin (LIPITOR) 10 MG tablet Take 10 mg by mouth daily.  [provider]  Calcium Carbonate (CALCIUM 600 PO) Take 1 tablet by mouth daily.     [provider]  Cholecalciferol (VITAMIN D) 2000 UNITS tablet Take 2,000 Units by mouth daily.      [provider]  CIALIS 5 MG tablet TK 1/2 T PO QD 06/16/17   [provider]  Coenzyme Q10 10 MG capsule Take 10 mg by mouth.    [provider]  colchicine 0.6 MG tablet Take 0.6 mg by mouth.    [provider]  Febuxostat (ULORIC)  80 MG TABS Take 1 tablet by mouth daily.     [provider]  glimepiride (AMARYL) 4 MG tablet Take 4 mg by mouth daily with breakfast.  06/15/17   [provider]  lacosamide 100 MG TABS Take 1 tablet (100 mg total) by mouth 2 (two) times daily. 01/04/19   Gladstone Lighter, MD  lisinopril (PRINIVIL,ZESTRIL) 5 MG tablet Take 5 mg by mouth daily.      [provider]  metoprolol tartrate (LOPRESSOR) 25 MG tablet Take 0.5 tablets (12.5 mg total) by mouth 2 (two) times daily. 12/30/18   Demetrios Loll, MD  Multiple Vitamins-Minerals (MULTIVITAMIN ADULT EXTRA C PO) Take 1 tablet by mouth daily.     [provider]  nystatin-triamcinolone (MYCOLOG II) cream Apply to affected areas bid 05/08/16   [provider]  Red Yeast Rice 600 MG TABS Take 1 tablet by mouth daily.     [provider]  senna-docusate (SENOKOT-S) 8.6-50 MG tablet Take 1 tablet by mouth 2 (two) times daily. 01/04/19   Gladstone Lighter, MD  Vitamins A & D (VITAMIN A & D) [address] UNITS TABS Take 1 tablet by mouth daily.     [provider]      VITAL SIGNS:  Blood pressure (!) 172/69, pulse 67, temperature 98 F (36.7 C), temperature source Oral, resp. rate (!) 26, SpO2 100 %. PHYSICAL EXAMINATION:  Physical Exam  GENERAL:  85 y.o.-year-old patient lying in the bed with no acute distress.  EYES: Pupils equal, round, reactive to light and accommodation. No scleral icterus. Extraocular muscles intact.  HEENT: Head atraumatic, normocephalic. Oropharynx and nasopharynx clear.  NECK:  Supple, no jugular venous distention. No thyroid enlargement, no tenderness.  LUNGS: Normal breath sounds bilaterally, no wheezing, rales,rhonchi or crepitation. No use of accessory muscles of respiration.  CARDIOVASCULAR: S1, S2 normal. No murmurs, rubs, or gallops.  ABDOMEN: Soft, nontender, nondistended. Bowel sounds present. No organomegaly or mass.  EXTREMITIES: No pedal edema, cyanosis, or  clubbing.  NEUROLOGIC: Cranial nerves II through XII are intact. Muscle strength 5/5 in all extremities. Sensation intact. Gait not checked.  PSYCHIATRIC: The patient is alert and oriented x 3.  SKIN: No obvious rash, lesion, or ulcer.  LABORATORY PANEL:   CBC Recent Labs  Lab 11/12/19 1531  WBC 9.6  HGB 15.4  HCT 45.2  PLT 177   ------------------------------------------------------------------------------------------------------------------  Chemistries  Recent Labs  Lab 11/12/19 1531  NA 137  K 4.9  CL 105  CO2 22  GLUCOSE 262*  BUN 50*  CREATININE 2.27*  CALCIUM 8.6*  AST 25  ALT 15  ALKPHOS 83  BILITOT 0.7   ------------------------------------------------------------------------------------------------------------------  Cardiac Enzymes No results for input(s): TROPONINI in the last 168 hours. ------------------------------------------------------------------------------------------------------------------  RADIOLOGY:  Ct Head Wo Contrast  Result Date: 11/12/2019 CLINICAL DATA:  Altered level of consciousness (LOC), unexplained EXAM: CT HEAD WITHOUT CONTRAST TECHNIQUE: Contiguous axial images were obtained from the base  of the skull through the vertex without intravenous contrast. COMPARISON:  Head CT and brain MRI 12/30/2018 FINDINGS: Brain: Previous right subdural hematoma has diminished in size, small residual chronic subdural collection measures 3 mm in the parietal frontal region. No hyperdensity to suggest acute hemorrhage component. Unchanged generalized atrophy and chronic small vessel ischemia. No evidence of acute infarct. No hydrocephalus or midline shift. Vascular: Atherosclerosis of skullbase vasculature without hyperdense vessel or abnormal calcification. Skull: No fracture or focal lesion. Sinuses/Orbits: Mild mucosal thickening with small fluid levels in the right and left sphenoid sinus. Remaining visualized paranasal sinuses are clear. Opacification  of lower left and right mastoid air cells, unchanged. Other: None. IMPRESSION: 1.  No acute intracranial abnormality. 2. Small right chronic subdural hematoma, diminished in size since prior imaging. No acute component, midline shift or mass effect. 3. Stable atrophy and chronic small vessel ischemia. 4. Mucosal thickening with small fluid levels in the sphenoid sinus, can be seen with acute sinusitis in the appropriate clinical setting. Electronically Signed   By: Keith Rake M.D.   On: 11/12/2019 15:47   IMPRESSION AND PLAN:  86 year old male with past medical history significant for A. 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  This could be due to UTI. - check MRI of the brain rule out stroke -Continue IV Rocephin, await urine culture -Obtain carotid Dopplers -Check TSH -PT OT speech eval  UTI -Based on UA, continue Rocephin, obtain urine culture  Acute on chronic kidney disease stage III -Avoid nephrotoxic medications.  Gentle hydration and monitor kidney function, creatinine at 2.27 Creatinine at 2.27 Diabetes type 2 with neuropathy-  sliding scale insulin, follow blood sugars.  Hold glimepiride for now. Check hemoglobin A1c  History of recurrent pulmonary embolism due to factor V Leiden mutation-continue Coumadin.  Patient has had history of subdural hematoma in January 2020 for which Coumadin was held for about 2 weeks and he has been back on it -Check coags   All the records are reviewed and case discussed with ED provider. Management plans discussed with the patient, family (daughter at bedside) and they are in agreement.  CODE STATUS: DNR  TOTAL TIME TAKING CARE OF THIS PATIENT: 45 minutes.    Max Sane M.D on 11/12/2019 at 5:04 PM  Between 7am to 6pm - Pager -  [phone]  After 6pm go to [email or web address] - password [email or web address]  [facility]   CC: Primary care physician; [name], MD   Note: This dictation was prepared with Dragon dictation along with smaller phrase technology. Any transcriptional errors that result from this process are unintentional.

## 2019-11-13 ENCOUNTER — Observation Stay: Payer: Medicare Other

## 2019-11-13 ENCOUNTER — Observation Stay (HOSPITAL_COMMUNITY)
Admit: 2019-11-13 | Discharge: 2019-11-13 | Disposition: A | Discharge status: Home / Self Care | Payer: Medicare Other | Attending: Internal Medicine | Admitting: Internal Medicine

## 2019-11-13 DIAGNOSIS — R4182 Altered mental status, unspecified: Secondary | ICD-10-CM

## 2019-11-13 DIAGNOSIS — I6389 Other cerebral infarction: Secondary | ICD-10-CM | POA: Diagnosis not present

## 2019-11-13 DIAGNOSIS — I6523 Occlusion and stenosis of bilateral carotid arteries: Secondary | ICD-10-CM | POA: Diagnosis not present

## 2019-11-13 DIAGNOSIS — I63411 Cerebral infarction due to embolism of right middle cerebral artery: Secondary | ICD-10-CM

## 2019-11-13 DIAGNOSIS — I639 Cerebral infarction, unspecified: Secondary | ICD-10-CM

## 2019-11-13 DIAGNOSIS — D6851 Activated protein C resistance: Secondary | ICD-10-CM | POA: Diagnosis present

## 2019-11-13 DIAGNOSIS — E785 Hyperlipidemia, unspecified: Secondary | ICD-10-CM | POA: Diagnosis present

## 2019-11-13 DIAGNOSIS — N390 Urinary tract infection, site not specified: Secondary | ICD-10-CM

## 2019-11-13 DIAGNOSIS — B961 Klebsiella pneumoniae [K. pneumoniae] as the cause of diseases classified elsewhere: Secondary | ICD-10-CM | POA: Diagnosis present

## 2019-11-13 DIAGNOSIS — I6340 Cerebral infarction due to embolism of unspecified cerebral artery: Secondary | ICD-10-CM | POA: Diagnosis present

## 2019-11-13 DIAGNOSIS — E1122 Type 2 diabetes mellitus with diabetic chronic kidney disease: Secondary | ICD-10-CM | POA: Diagnosis present

## 2019-11-13 DIAGNOSIS — E1140 Type 2 diabetes mellitus with diabetic neuropathy, unspecified: Secondary | ICD-10-CM | POA: Diagnosis present

## 2019-11-13 DIAGNOSIS — Z66 Do not resuscitate: Secondary | ICD-10-CM | POA: Diagnosis present

## 2019-11-13 DIAGNOSIS — Z86711 Personal history of pulmonary embolism: Secondary | ICD-10-CM | POA: Diagnosis not present

## 2019-11-13 DIAGNOSIS — N179 Acute kidney failure, unspecified: Secondary | ICD-10-CM

## 2019-11-13 DIAGNOSIS — B9620 Unspecified Escherichia coli [E. coli] as the cause of diseases classified elsewhere: Secondary | ICD-10-CM | POA: Diagnosis present

## 2019-11-13 DIAGNOSIS — N1832 Chronic kidney disease, stage 3b: Secondary | ICD-10-CM | POA: Diagnosis present

## 2019-11-13 DIAGNOSIS — Z23 Encounter for immunization: Secondary | ICD-10-CM | POA: Diagnosis not present

## 2019-11-13 DIAGNOSIS — I10 Essential (primary) hypertension: Secondary | ICD-10-CM | POA: Diagnosis not present

## 2019-11-13 DIAGNOSIS — N319 Neuromuscular dysfunction of bladder, unspecified: Secondary | ICD-10-CM | POA: Diagnosis present

## 2019-11-13 DIAGNOSIS — Z89421 Acquired absence of other right toe(s): Secondary | ICD-10-CM | POA: Diagnosis not present

## 2019-11-13 DIAGNOSIS — I129 Hypertensive chronic kidney disease with stage 1 through stage 4 chronic kidney disease, or unspecified chronic kidney disease: Secondary | ICD-10-CM | POA: Diagnosis present

## 2019-11-13 DIAGNOSIS — R29701 NIHSS score 1: Secondary | ICD-10-CM | POA: Diagnosis present

## 2019-11-13 DIAGNOSIS — Z8673 Personal history of transient ischemic attack (TIA), and cerebral infarction without residual deficits: Secondary | ICD-10-CM | POA: Diagnosis not present

## 2019-11-13 DIAGNOSIS — Z20828 Contact with and (suspected) exposure to other viral communicable diseases: Secondary | ICD-10-CM | POA: Diagnosis present

## 2019-11-13 DIAGNOSIS — I4891 Unspecified atrial fibrillation: Secondary | ICD-10-CM | POA: Diagnosis present

## 2019-11-13 DIAGNOSIS — N183 Chronic kidney disease, stage 3 unspecified: Secondary | ICD-10-CM | POA: Diagnosis present

## 2019-11-13 DIAGNOSIS — Z7984 Long term (current) use of oral hypoglycemic drugs: Secondary | ICD-10-CM | POA: Diagnosis not present

## 2019-11-13 LAB — LIPID PANEL
Cholesterol: 221 mg/dL — ABNORMAL HIGH (ref 0–200)
HDL: 36 mg/dL — ABNORMAL LOW (ref >40)
LDL Cholesterol: 156 mg/dL — ABNORMAL HIGH (ref 0–99)
Total CHOL/HDL Ratio: 6.1 RATIO
Triglycerides: 147 mg/dL (ref <150)
VLDL: 29 mg/dL (ref 0–40)

## 2019-11-13 LAB — HEMOGLOBIN A1C
Hgb A1c MFr Bld: 7.1 % — ABNORMAL HIGH (ref 4.8–5.6)
Hgb A1c MFr Bld: 7 % — ABNORMAL HIGH (ref 4.8–5.6)
Mean Plasma Glucose: 157.07 mg/dL
Mean Plasma Glucose: 154.2 mg/dL

## 2019-11-13 LAB — CBC
HCT: 42.6 % (ref 39.0–52.0)
Hemoglobin: 14.6 g/dL (ref 13.0–17.0)
MCH: 32 pg (ref 26.0–34.0)
MCHC: 34.3 g/dL (ref 30.0–36.0)
MCV: 93.4 fL (ref 80.0–100.0)
Platelets: 181 10*3/uL (ref 150–400)
RBC: 4.56 MIL/uL (ref 4.22–5.81)
RDW: 13.2 % (ref 11.5–15.5)
WBC: 10.4 10*3/uL (ref 4.0–10.5)
nRBC: 0 % (ref 0.0–0.2)

## 2019-11-13 LAB — BASIC METABOLIC PANEL
Anion gap: 9 (ref 5–15)
BUN: 44 mg/dL — ABNORMAL HIGH (ref 8–23)
CO2: 19 mmol/L — ABNORMAL LOW (ref 22–32)
Calcium: 8.4 mg/dL — ABNORMAL LOW (ref 8.9–10.3)
Chloride: 110 mmol/L (ref 98–111)
Creatinine, Ser: 2.1 mg/dL — ABNORMAL HIGH (ref 0.61–1.24)
GFR calc Af Amer: 32 mL/min — ABNORMAL LOW (ref >60)
GFR calc non Af Amer: 28 mL/min — ABNORMAL LOW (ref >60)
Glucose, Bld: 168 mg/dL — ABNORMAL HIGH (ref 70–99)
Potassium: 4.7 mmol/L (ref 3.5–5.1)
Sodium: 138 mmol/L (ref 135–145)

## 2019-11-13 LAB — ECHOCARDIOGRAM COMPLETE
Height: 74 in
Weight: 3156.99 oz

## 2019-11-13 LAB — PROTIME-INR
INR: 2.3 — ABNORMAL HIGH (ref 0.8–1.2)
Prothrombin Time: 25 seconds — ABNORMAL HIGH (ref 11.4–15.2)

## 2019-11-13 LAB — SARS CORONAVIRUS 2 (TAT 6-24 HRS): SARS Coronavirus 2: NEGATIVE

## 2019-11-13 MED ORDER — ATORVASTATIN CALCIUM 20 MG PO TABS
40.0000 mg | ORAL_TABLET | Freq: Every day | ORAL | Status: DC
  Administered 2019-11-14 – 2019-11-16 (×3): 40 mg via ORAL
  Filled 2019-11-13 (×3): qty 2

## 2019-11-13 MED ORDER — PERFLUTREN LIPID MICROSPHERE
1.0000 mL | INTRAVENOUS | Status: AC | PRN
  Administered 2019-11-13: 2 mL via INTRAVENOUS
  Filled 2019-11-13: qty 10

## 2019-11-13 MED ORDER — ASPIRIN 81 MG PO CHEW
81.0000 mg | CHEWABLE_TABLET | Freq: Every day | ORAL | Status: DC
  Administered 2019-11-14 – 2019-11-16 (×3): 81 mg via ORAL
  Filled 2019-11-13 (×3): qty 1

## 2019-11-13 NOTE — Progress Notes (Signed)
Pt more alert and appropriate this morning. Missed cactus on pictures for stroke-spoke around it but could not articulate the word. Still says he is "65" years old. Pt up to chair for lunch but on recheck he ask to be put back to bed; refused lunch. Stated "I'm not hungry".  No other noted changes except fatigue. Voided incontinent x 2; declined need for self cath.

## 2019-11-13 NOTE — Evaluation (Signed)
SLP Cancellation Note  Patient Details Name: Richard [name] MRN: [id number] DOB: [birth year]   Cancelled treatment:       Reason Eval/Treat Not Completed: SLP screened, no needs identified, will sign off  RN reports that pt speech is progressing independently. Pt states this is baseline from previous CVA and he feels his speech is clear. RN has no concerns at this time. ST will sign off, however if changes occur re consult.   Tomie China 11/13/2019, 9:34 AM

## 2019-11-13 NOTE — Evaluation (Signed)
Physical Therapy Evaluation Patient Details Name: Richard [name] MRN: [id number] DOB: May 10, 1931 Today's Date: 11/13/2019   History of Present Illness  Pt is an 85 year old male presenting with AMS, family found him in his garage, confused. MRI findings 11/12/19: Small focus of acute ischemia within the right precentral gyrus. PMH of of atrial fibrillation on Coumadin INR of 2.1 on this admission, diabetes, and hypertension.  Clinical Impression  Patient with current impairments in balance, LE and trunk strength, postural deficits, and postural awareness. Activity limitations in transfers, ambulation, dynamic balance tasks; inhibiting full participation in ind, safe ADLs. Patient with R drift/wt shift in standing and ambulation, that he is able to correct with cuing, but reports he cannot tell that he is leaning when asked. Does correct R drift in walking before he runs into obstacles/walls on his R side. Is able to verbally describe L and R environment. Due to the adaptability of patient's home and 24/7 nursing care, patient may be able to safely return home if he is able to have 24/7 supervision d/t fall risk and confusion. Would benefit from skilled PT to address above deficits and promote optimal return to PLOF.     Follow Up Recommendations Supervision/Assistance - 24 hour;Home health PT    Equipment Recommendations  Rolling walker with 5" wheels    Recommendations for Other Services       Precautions / Restrictions Precautions Precautions: Fall Restrictions Weight Bearing Restrictions: No      Mobility  Bed Mobility Overal bed mobility: Needs Assistance Bed Mobility: Supine to Sit     Supine to sit: Modified independent (Device/Increase time)     General bed mobility comments: Increased time needed, use of handrails  Transfers Overall transfer level: Needs assistance Equipment used: Rolling walker (2 wheeled) Transfers: Sit to/from Stand Sit to Stand: Min assist          General transfer comment: minA needed for initiation, able to complete transfer and establish standing balance following  Ambulation/Gait Ambulation/Gait assistance: Modified independent (Device/Increase time) Gait Distance (Feet): 150 Feet Assistive device: Rolling walker (2 wheeled) Gait Pattern/deviations: Drifts right/left Gait velocity: decreased   General Gait Details: Patient steers walker and leans to R but is able to correct to L before running into wall/obstacle  Stairs            Wheelchair Mobility    Modified Rankin (Stroke Patients Only)       Balance Overall balance assessment: Needs assistance Sitting-balance support: Feet supported Sitting balance-Leahy Scale: Good   Postural control: Right lateral lean Standing balance support: No upper extremity supported Standing balance-Leahy Scale: Fair                               Pertinent Vitals/Pain Pain Assessment: No/denies pain    Home Living Family/patient expects to be discharged to:: Private residence Living Arrangements: Alone;Other (Comment) Available Help at Discharge: Family;Personal care attendant Type of Home: House Home Access: Stairs to enter Entrance Stairs-Rails: None Entrance Stairs-Number of Steps: 1 Home Layout: One level Home Equipment: Walker - 2 wheels;Walker - 4 wheels Additional Comments: Reports there are grab bars all around home, even in garage.    Prior Function Level of Independence: Needs assistance   Gait / Transfers Assistance Needed: Needs assistance from son for transfers, able to ambulate to bathroom/household distances with caregiver and son with [phone].   ADL's / Homemaking Assistance Needed: Per nurse: patient feeds  himself, can dress himself but she usually does this, nurse helps patient bathe        Hand Dominance   Dominant Hand: Right    Extremity/Trunk Assessment   Upper Extremity Assessment Upper Extremity Assessment: Overall  WFL for tasks assessed    Lower Extremity Assessment Lower Extremity Assessment: Defer to PT evaluation    Cervical / Trunk Assessment Cervical / Trunk Assessment: Normal  Communication   Communication: (mild delay in response time with intermittent confusion)  Cognition Arousal/Alertness: Awake/alert Behavior During Therapy: WFL for tasks assessed/performed Overall Cognitive Status: No family/caregiver present to determine baseline cognitive functioning                                 General Comments: Unsure of baseline cognition. Oriented close to correct (ie one day off on his birthday, initially says "I live with 85men and a polar bear" but eventually able to say he lives with a nurse and recalls her telephone number)      General Comments      Exercises Other Exercises Other Exercises: Supine to sit, patient able to follow cuing to sit with handrail support without need for physical assitance Other Exercises: STS able to comply with cuing for hand placement to complete stand, minA for initiation. ABle to sit with control following heavy PT cuing for safety Other Exercises: AMb with RW, heavy cuing for sequencing, ppatient drifts to the R but is able to correct before running into wall/obstacle. Heavy cuing for foot clearance and to "keep feet inside RW"   Assessment/Plan    PT Assessment Patient needs continued PT services  PT Problem List Decreased strength;Decreased mobility;Decreased range of motion;Decreased coordination;Decreased activity tolerance;Decreased balance       PT Treatment Interventions Gait training;Therapeutic exercise;DME instruction;Therapeutic activities;Patient/family education;Functional mobility training;Neuromuscular re-education;Manual techniques;Balance training;Stair training    PT Goals (Current goals can be found in the Care Plan section)  Acute Rehab PT Goals Patient Stated Goal: Return home independently PT Goal Formulation:  With patient/family Time For Goal Achievement: 11/27/19 Potential to Achieve Goals: Fair    Frequency Min 2X/week   Barriers to discharge        Co-evaluation               AM-PAC PT "6 Clicks" Mobility  Outcome Measure Help needed turning from your back to your side while in a flat bed without using bedrails?: A Little Help needed moving from lying on your back to sitting on the side of a flat bed without using bedrails?: A Little Help needed moving to and from a bed to a chair (including a wheelchair)?: A Little Help needed standing up from a chair using your arms (e.g., wheelchair or bedside chair)?: A Little Help needed to walk in hospital room?: A Little Help needed climbing 3-5 steps with a railing? : A Lot 6 Click Score: 17    End of Session Equipment Utilized During Treatment: Gait belt Activity Tolerance: Patient tolerated treatment well Patient left: in chair;with call bell/phone within reach;with chair alarm set Nurse Communication: Mobility status PT Visit Diagnosis: History of falling (Z91.81);Other abnormalities of gait and mobility (R26.89);Unsteadiness on feet (R26.81);Muscle weakness (generalized) (M62.81)    Time: 0158-0221 PT Time Calculation (min) (ACUTE ONLY): 23 min   Charges:     PT Treatments $Therapeutic Activity: 23-37 mins      Shelton Silvas PT, DPT  Shelton Silvas 11/13/2019, 3:05 PM

## 2019-11-13 NOTE — Consult Note (Signed)
Reason for Consult: confusion  Referring Physician: Dr. Manuella Ghazi   CC: stroke   HPI: Richard [name] is an 85 y.o. male with hx of of atrial fibrillation on Coumadin INR of 2.1 on this admission, diabetes, hypertension is being admitted for altered mental status The patient was found in the garage by his family and appeared confused and disoriented.  Per patient's daughter this is first-time and probably lasted about 10 to 15 minutes at the most.Per EMS, he had a history of a stroke last year and the family was concerned about another stroke.  Currently mentation much improved.    Past Medical History:  Diagnosis Date  . Atrial fibrillation ([place])   . Cancer ([place])   . Chronic kidney disease   . Colon polyp   . Diabetes mellitus   . History of blood clotting disorder   . HTN (hypertension)     Past Surgical History:  Procedure Laterality Date  . AMPUTATION TOE Right 11/29/2018   Procedure: AMPUTATION TOE;  Surgeon: Albertine Patricia, DPM;  Location: [facility];  Service: Podiatry;  Laterality: Right;  . EYE SURGERY     CATARACTS  . HERNIA REPAIR    . LOWER EXTREMITY ANGIOGRAPHY Right 12/02/2018   Procedure: Lower Extremity Angiography;  Surgeon: Algernon Huxley, MD;  Location: [facility];  Service: Cardiovascular;  Laterality: Right;  . SKIN CANCER EXCISION      No family history on file.  Social History:  reports that he has never smoked. He has never used smokeless tobacco. He reports that he does not drink alcohol or use drugs.  No Known Allergies  Medications: I have reviewed the patient's current medications.  ROS: History obtained from the patient  General ROS: negative for - chills, fatigue, fever, night sweats, weight gain or weight loss Psychological ROS: negative for - behavioral disorder, hallucinations, memory difficulties, mood swings or suicidal ideation Ophthalmic ROS: negative for - blurry vision, double vision, eye pain or loss of vision ENT ROS: negative  for - epistaxis, nasal discharge, oral lesions, sore throat, tinnitus or vertigo Allergy and Immunology ROS: negative for - hives or itchy/watery eyes Hematological and Lymphatic ROS: negative for - bleeding problems, bruising or swollen lymph nodes Endocrine ROS: negative for - galactorrhea, hair pattern changes, polydipsia/polyuria or temperature intolerance Respiratory ROS: negative for - cough, hemoptysis, shortness of breath or wheezing Cardiovascular ROS: negative for - chest pain, dyspnea on exertion, edema or irregular heartbeat Gastrointestinal ROS: negative for - abdominal pain, diarrhea, hematemesis, nausea/vomiting or stool incontinence Genito-Urinary ROS: negative for - dysuria, hematuria, incontinence or urinary frequency/urgency Musculoskeletal ROS: negative for - joint swelling or muscular weakness Neurological ROS: as noted in HPI Dermatological ROS: negative for rash and skin lesion changes  Physical Examination: Blood pressure (!) 148/100, pulse 61, temperature 97.7 F (36.5 C), temperature source Oral, resp. rate 20, height 6\' 2"  (1.88 m), weight 89.5 kg, SpO2 100 %.   Neurological Examination   Mental Status: Alert, oriented, thought content appropriate.  Speech fluent without evidence of aphasia.  Able to follow 3 step commands without difficulty. Cranial Nerves: II: Discs flat bilaterally; Visual fields grossly normal, pupils equal, round, reactive to light and accommodation III,IV, VI: ptosis not present, extra-ocular motions intact bilaterally V,VII: smile symmetric, facial light touch sensation normal bilaterally VIII: hearing normal bilaterally IX,X: gag reflex present XI: bilateral shoulder shrug XII: midline tongue extension Motor: Right : Upper extremity   5/5    Left:     Upper  extremity   5/5  Lower extremity   5/5     Lower extremity   5/5 Tone and bulk:normal tone throughout; no atrophy noted Sensory: Pinprick and light touch intact throughout,  bilaterally Deep Tendon Reflexes: 1+ and symmetric throughout Plantars: Right: downgoing   Left: downgoing Cerebellar: normal finger-to-nose, normal rapid alternating movements and normal heel-to-shin test Gait: not tested       Laboratory Studies:   Basic Metabolic Panel: Recent Labs  Lab 11/12/19 1531  NA 137  K 4.9  CL 105  CO2 22  GLUCOSE 262*  BUN 50*  CREATININE 2.27*  CALCIUM 8.6*    Liver Function Tests: Recent Labs  Lab 11/12/19 1531  AST 25  ALT 15  ALKPHOS 83  BILITOT 0.7  PROT 7.1  ALBUMIN 3.0*   No results for input(s): LIPASE, AMYLASE in the last 168 hours. No results for input(s): AMMONIA in the last 168 hours.  CBC: Recent Labs  Lab 11/12/19 1531  WBC 9.6  NEUTROABS 6.2  HGB 15.4  HCT 45.2  MCV 94.2  PLT 177    Cardiac Enzymes: Recent Labs  Lab 11/12/19 1531  CKTOTAL 72    BNP: Invalid input(s): POCBNP  CBG: No results for input(s): GLUCAP in the last 168 hours.  Microbiology: Results for orders placed or performed during the hospital encounter of 11/12/19  SARS CORONAVIRUS 2 (TAT 6-24 HRS) Nasopharyngeal Nasopharyngeal Swab     Status: None   Collection Time: 11/12/19  5:57 PM   Specimen: Nasopharyngeal Swab  Result Value Ref Range Status   SARS Coronavirus 2 NEGATIVE NEGATIVE Final    Comment: (NOTE) SARS-CoV-2 target nucleic acids are NOT DETECTED. The SARS-CoV-2 RNA is generally detectable in upper and lower respiratory specimens during the acute phase of infection. Negative results do not preclude SARS-CoV-2 infection, do not rule out co-infections with other pathogens, and should not be used as the sole basis for treatment or other patient management decisions. Negative results must be combined with clinical observations, patient history, and epidemiological information. The expected result is Negative. Fact Sheet for Patients: SugarRoll.be Fact Sheet for Healthcare  Providers: https://www.woods-mathews.com/ This test is not yet approved or cleared by the [place] FDA and  has been authorized for detection and/or diagnosis of SARS-CoV-2 by FDA under an Emergency Use Authorization (EUA). This EUA will remain  in effect (meaning this test can be used) for the duration of the COVID-19 declaration under Section 56 4(b)(1) of the Act, 21 U.S.C. section 360bbb-3(b)(1), unless the authorization is terminated or revoked sooner. Performed at [facility], [place] [address] [place], [place] [address]     Coagulation Studies: Recent Labs    11/12/19 1531  LABPROT 23.5*  INR 2.1*    Urinalysis:  Recent Labs  Lab 11/12/19 1532  COLORURINE AMBER*  LABSPEC 1.015  PHURINE 7.0  GLUCOSEU NEGATIVE  HGBUR NEGATIVE  BILIRUBINUR NEGATIVE  KETONESUR NEGATIVE  PROTEINUR 100*  NITRITE NEGATIVE  LEUKOCYTESUR MODERATE*    Lipid Panel:     Component Value Date/Time   CHOL 221 (H) 11/13/2019 0450   TRIG 147 11/13/2019 0450   HDL 36 (L) 11/13/2019 0450   CHOLHDL 6.1 11/13/2019 0450   VLDL 29 11/13/2019 0450   LDLCALC 156 (H) 11/13/2019 0450    HgbA1C: No results found for: HGBA1C  Urine Drug Screen:  No results found for: LABOPIA, COCAINSCRNUR, LABBENZ, AMPHETMU, THCU, LABBARB  Alcohol Level: No results for input(s): ETH in the last 168 hours.  Other results: EKG: - irregular, irregular .  Imaging: Ct Head Wo Contrast  Result Date: 11/12/2019 CLINICAL DATA:  Altered level of consciousness (LOC), unexplained EXAM: CT HEAD WITHOUT CONTRAST TECHNIQUE: Contiguous axial images were obtained from the base of the skull through the vertex without intravenous contrast. COMPARISON:  Head CT and brain MRI 12/30/2018 FINDINGS: Brain: Previous right subdural hematoma has diminished in size, small residual chronic subdural collection measures 3 mm in the parietal frontal region. No hyperdensity to suggest acute hemorrhage component.  Unchanged generalized atrophy and chronic small vessel ischemia. No evidence of acute infarct. No hydrocephalus or midline shift. Vascular: Atherosclerosis of skullbase vasculature without hyperdense vessel or abnormal calcification. Skull: No fracture or focal lesion. Sinuses/Orbits: Mild mucosal thickening with small fluid levels in the right and left sphenoid sinus. Remaining visualized paranasal sinuses are clear. Opacification of lower left and right mastoid air cells, unchanged. Other: None. IMPRESSION: 1.  No acute intracranial abnormality. 2. Small right chronic subdural hematoma, diminished in size since prior imaging. No acute component, midline shift or mass effect. 3. Stable atrophy and chronic small vessel ischemia. 4. Mucosal thickening with small fluid levels in the sphenoid sinus, can be seen with acute sinusitis in the appropriate clinical setting. Electronically Signed   By: Keith Rake M.D.   On: 11/12/2019 15:47   Mr Brain Wo Contrast  Result Date: 11/12/2019 CLINICAL DATA:  Altered mental status EXAM: MRI HEAD WITHOUT CONTRAST TECHNIQUE: Multiplanar, multiecho pulse sequences of the brain and surrounding structures were obtained without intravenous contrast. COMPARISON:  Brain MRI 12/31/2018 FINDINGS: Brain: There is a small focus abnormal diffusion restriction within the right precentral gyrus (series 2, image 43). No acute hemorrhage. Early confluent hyperintense T2-weighted signal of the periventricular and deep white matter, most commonly due to chronic ischemic microangiopathy. There is generalized atrophy without lobar predilection. Blood-sensitive sequences show no chronic microhemorrhage or superficial siderosis. The midline structures are normal. Vascular: Normal flow voids. Skull and upper cervical spine: Normal marrow signal. Sinuses/Orbits: Negative. Other: None. IMPRESSION: 1. Small focus of acute ischemia within the right precentral gyrus. No hemorrhage or mass effect. 2.  Generalized atrophy and chronic ischemic microangiopathy. Electronically Signed   By: Ulyses Jarred M.D.   On: 11/12/2019 22:54   US Carotid Bilateral (at [facility] Only)  Result Date: 11/13/2019 CLINICAL DATA:  Altered mental status, hypertension and diabetes. EXAM: BILATERAL CAROTID DUPLEX ULTRASOUND TECHNIQUE: Pearline Cables scale imaging, color Doppler and duplex ultrasound were performed of bilateral carotid and vertebral arteries in the neck. COMPARISON:  01/01/2019 FINDINGS: Criteria: Quantification of carotid stenosis is based on velocity parameters that correlate the residual internal carotid diameter with NASCET-based stenosis levels, using the diameter of the distal internal carotid lumen as the denominator for stenosis measurement. The following velocity measurements were obtained: RIGHT ICA:  92/16 cm/sec CCA:  78/93 cm/sec SYSTOLIC ICA/CCA RATIO:  1.2 ECA:  162 cm/sec LEFT ICA:  94/13 cm/sec CCA:  81/01 cm/sec SYSTOLIC ICA/CCA RATIO:  1.0 ECA:  87 cm/sec RIGHT CAROTID ARTERY: Mild amount of partially calcified plaque at the level of the proximal right ICA. Estimated right ICA stenosis is less than 50%. RIGHT VERTEBRAL ARTERY: Antegrade flow with normal waveform and velocity. LEFT CAROTID ARTERY: Mild calcified plaque at the level of the carotid bulb and proximal left ICA. Estimated left ICA stenosis is less than 50%. LEFT VERTEBRAL ARTERY: Antegrade flow with normal waveform and velocity. IMPRESSION: Mild plaque at the level of both proximal internal carotid arteries with estimated  bilateral ICA stenoses of less than 50%. Electronically Signed   By: Aletta Edouard M.D.   On: 11/13/2019 09:21     Assessment/Plan:   85 y.o. male with hx of of atrial fibrillation on Coumadin INR of 2.1 on this admission, diabetes, hypertension is being admitted for altered mental status The patient was found in the garage by his family and appeared confused and disoriented.  Per patient's daughter this is first-time  and probably lasted about 10 to 15 minutes at the most.Per EMS, he had a history of a stroke last year and the family was concerned about another stroke.  Currently mentation much improved.    - Right precentral gyrus stroke that is likely embolic in setting of A-fib - Mentation much improved - There is about 3% chance of yearly of stroke in setting of A-fib despite anticoagulation. His risk is possibly a bit higher given the hx of DM and HTN - received ASA 325  - Would add ASA 81mg  daily on d/c and continued coumadin.  -No further imaging from neurological stand point at this time - mentation related due to possible infection and AKI.   11/13/2019, 11:36 AM

## 2019-11-13 NOTE — Progress Notes (Signed)
Pt voided incontinent twice; denies need for In and Out Cath and declined it. Denies bladder fullness or need for self cath. Bladder scan done with 826ml found. In and Out Cath done with 1300 ml foul smelling cloudy urine with purulent drainage and end of cath. Pt is poor historian and unable to relate bladder fullness. Will do scheduled I& O caths going forward.

## 2019-11-13 NOTE — Progress Notes (Signed)
*  PRELIMINARY RESULTS* Echocardiogram 2D Echocardiogram has been performed.  Richard [name] 11/13/2019, 11:42 AM

## 2019-11-13 NOTE — Progress Notes (Signed)
[place] at [place] NAME: Richard [name]    MR#:  [id number]  DATE OF BIRTH:  [birth year]  SUBJECTIVE:  CHIEF COMPLAINT:   Chief Complaint  Patient presents with   Altered Mental Status  No new complaints, blood pressure somewhat elevated, waiting for echo REVIEW OF SYSTEMS:  Review of Systems  Constitutional: Negative for diaphoresis, fever, malaise/fatigue and weight loss.  HENT: Negative for ear discharge, ear pain, hearing loss, nosebleeds, sore throat and tinnitus.   Eyes: Negative for blurred vision and pain.  Respiratory: Negative for cough, hemoptysis, shortness of breath and wheezing.   Cardiovascular: Negative for chest pain, palpitations, orthopnea and leg swelling.  Gastrointestinal: Negative for abdominal pain, blood in stool, constipation, diarrhea, heartburn, nausea and vomiting.  Genitourinary: Negative for dysuria, frequency and urgency.  Musculoskeletal: Negative for back pain and myalgias.  Skin: Negative for itching and rash.  Neurological: Negative for dizziness, tingling, tremors, focal weakness, seizures, weakness and headaches.  Psychiatric/Behavioral: Negative for depression. The patient is not nervous/anxious.     DRUG ALLERGIES:  No Known Allergies VITALS:  Blood pressure (!) 148/100, pulse 61, temperature 97.7 F (36.5 C), temperature source Oral, resp. rate 20, height 6\' 2"  (1.88 m), weight 89.5 kg, SpO2 100 %. PHYSICAL EXAMINATION:  Physical Exam HENT:     Head: Normocephalic and atraumatic.  Eyes:     Conjunctiva/sclera: Conjunctivae normal.     Pupils: Pupils are equal, round, and reactive to light.  Neck:     Musculoskeletal: Normal range of motion and neck supple.     Thyroid: No thyromegaly.     Trachea: No tracheal deviation.  Cardiovascular:     Rate and Rhythm: Normal rate and regular rhythm.     Heart sounds: Normal heart sounds.  Pulmonary:     Effort: Pulmonary effort is normal. No respiratory  distress.     Breath sounds: Normal breath sounds. No wheezing.  Chest:     Chest wall: No tenderness.  Abdominal:     General: Bowel sounds are normal. There is no distension.     Palpations: Abdomen is soft.     Tenderness: There is no abdominal tenderness.  Musculoskeletal: Normal range of motion.  Skin:    General: Skin is warm and dry.     Findings: No rash.  Neurological:     Mental Status: He is alert and oriented to person, place, and time.     Cranial Nerves: No cranial nerve deficit.    LABORATORY PANEL:  Male CBC Recent Labs  Lab 11/12/19 1531  WBC 9.6  HGB 15.4  HCT 45.2  PLT 177   ------------------------------------------------------------------------------------------------------------------ Chemistries  Recent Labs  Lab 11/12/19 1531  NA 137  K 4.9  CL 105  CO2 22  GLUCOSE 262*  BUN 50*  CREATININE 2.27*  CALCIUM 8.6*  AST 25  ALT 15  ALKPHOS 83  BILITOT 0.7   RADIOLOGY:  Ct Head Wo Contrast  Result Date: 11/12/2019 CLINICAL DATA:  Altered level of consciousness (LOC), unexplained EXAM: CT HEAD WITHOUT CONTRAST TECHNIQUE: Contiguous axial images were obtained from the base of the skull through the vertex without intravenous contrast. COMPARISON:  Head CT and brain MRI 12/30/2018 FINDINGS: Brain: Previous right subdural hematoma has diminished in size, small residual chronic subdural collection measures 3 mm in the parietal frontal region. No hyperdensity to suggest acute hemorrhage component. Unchanged generalized atrophy and chronic small vessel ischemia. No evidence of  acute infarct. No hydrocephalus or midline shift. Vascular: Atherosclerosis of skullbase vasculature without hyperdense vessel or abnormal calcification. Skull: No fracture or focal lesion. Sinuses/Orbits: Mild mucosal thickening with small fluid levels in the right and left sphenoid sinus. Remaining visualized paranasal sinuses are clear. Opacification of lower left and right mastoid  air cells, unchanged. Other: None. IMPRESSION: 1.  No acute intracranial abnormality. 2. Small right chronic subdural hematoma, diminished in size since prior imaging. No acute component, midline shift or mass effect. 3. Stable atrophy and chronic small vessel ischemia. 4. Mucosal thickening with small fluid levels in the sphenoid sinus, can be seen with acute sinusitis in the appropriate clinical setting. Electronically Signed   By: Keith Rake M.D.   On: 11/12/2019 15:47   Mr Brain Wo Contrast  Result Date: 11/12/2019 CLINICAL DATA:  Altered mental status EXAM: MRI HEAD WITHOUT CONTRAST TECHNIQUE: Multiplanar, multiecho pulse sequences of the brain and surrounding structures were obtained without intravenous contrast. COMPARISON:  Brain MRI 12/31/2018 FINDINGS: Brain: There is a small focus abnormal diffusion restriction within the right precentral gyrus (series 2, image 43). No acute hemorrhage. Early confluent hyperintense T2-weighted signal of the periventricular and deep white matter, most commonly due to chronic ischemic microangiopathy. There is generalized atrophy without lobar predilection. Blood-sensitive sequences show no chronic microhemorrhage or superficial siderosis. The midline structures are normal. Vascular: Normal flow voids. Skull and upper cervical spine: Normal marrow signal. Sinuses/Orbits: Negative. Other: None. IMPRESSION: 1. Small focus of acute ischemia within the right precentral gyrus. No hemorrhage or mass effect. 2. Generalized atrophy and chronic ischemic microangiopathy. Electronically Signed   By: Ulyses Jarred M.D.   On: 11/12/2019 22:54   US Carotid Bilateral (at [facility] Only)  Result Date: 11/13/2019 CLINICAL DATA:  Altered mental status, hypertension and diabetes. EXAM: BILATERAL CAROTID DUPLEX ULTRASOUND TECHNIQUE: Pearline Cables scale imaging, color Doppler and duplex ultrasound were performed of bilateral carotid and vertebral arteries in the neck. COMPARISON:   01/01/2019 FINDINGS: Criteria: Quantification of carotid stenosis is based on velocity parameters that correlate the residual internal carotid diameter with NASCET-based stenosis levels, using the diameter of the distal internal carotid lumen as the denominator for stenosis measurement. The following velocity measurements were obtained: RIGHT ICA:  92/16 cm/sec CCA:  97/74 cm/sec SYSTOLIC ICA/CCA RATIO:  1.2 ECA:  162 cm/sec LEFT ICA:  94/13 cm/sec CCA:  14/23 cm/sec SYSTOLIC ICA/CCA RATIO:  1.0 ECA:  87 cm/sec RIGHT CAROTID ARTERY: Mild amount of partially calcified plaque at the level of the proximal right ICA. Estimated right ICA stenosis is less than 50%. RIGHT VERTEBRAL ARTERY: Antegrade flow with normal waveform and velocity. LEFT CAROTID ARTERY: Mild calcified plaque at the level of the carotid bulb and proximal left ICA. Estimated left ICA stenosis is less than 50%. LEFT VERTEBRAL ARTERY: Antegrade flow with normal waveform and velocity. IMPRESSION: Mild plaque at the level of both proximal internal carotid arteries with estimated bilateral ICA stenoses of less than 50%. Electronically Signed   By: Aletta Edouard M.D.   On: 11/13/2019 09:21   ASSESSMENT AND PLAN:  85 year old male with past medical history significant for A. 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   -carotid Dopplers not showing any hemodynamically significant stenosis -  TSH within normal limit -PT/OT/speech eval -Pending echo -Consult neurology  -Aspirin, high intensity statin, check fasting lipid profile  UTI -Based on UA, continue Rocephin, obtain urine culture  Acute on chronic kidney disease stage III -Avoid nephrotoxic medications.  Gentle  hydration and monitor kidney function, creatinine at 2.27 Creatinine at 2.27 Diabetes type 2 with neuropathy- sliding scale insulin, follow blood sugars. Hold glimepiride for now. Check hemoglobin A1c  History of recurrent pulmonary embolism due to factor V Leiden mutation- - history of subdural hematoma in January 2020 for which Coumadin was held for about 2 weeks -since then he has been back on it -INR was 2.1 yesterday, continue Coumadin     All the records are reviewed and case discussed with Care Management/Social Worker. Management plans discussed with the patient, nursing and they are in agreement.  CODE STATUS: DNR  TOTAL TIME TAKING CARE OF THIS PATIENT: 35 minutes.   More than 50% of the time was spent in counseling/coordination of care: YES  POSSIBLE D/C IN 1 DAYS, DEPENDING ON CLINICAL CONDITION.   Max Sane M.D on 11/13/2019 at 11:39 AM  Between 7am to 6pm - Pager - [phone]  After 6pm go to [email or web address] - password [email or web address]  [facility]   CC: Primary care physician; [name], MD  Note: This dictation was prepared with Dragon dictation along with smaller phrase technology. Any transcriptional errors that result from this process are unintentional.

## 2019-11-13 NOTE — Plan of Care (Signed)
  Problem: Education: Goal: Knowledge of General Education information will improve Description: Including pain rating scale, medication(s)/side effects and non-pharmacologic comfort measures Outcome: Progressing   Problem: Health Behavior/Discharge Planning: Goal: Ability to manage health-related needs will improve Outcome: Progressing   Problem: Clinical Measurements: Goal: Ability to maintain clinical measurements within normal limits will improve Outcome: Progressing Goal: Will remain free from infection Outcome: Progressing Goal: Diagnostic test results will improve Outcome: Progressing Goal: Respiratory complications will improve Outcome: Progressing   Problem: Activity: Goal: Risk for activity intolerance will decrease Outcome: Progressing   Problem: Nutrition: Goal: Adequate nutrition will be maintained Outcome: Progressing   Problem: Coping: Goal: Level of anxiety will decrease Outcome: Progressing   Problem: Elimination: Goal: Will not experience complications related to bowel motility Outcome: Progressing   Problem: Pain Managment: Goal: General experience of comfort will improve Outcome: Progressing   Problem: Safety: Goal: Ability to remain free from injury will improve Outcome: Progressing   Problem: Skin Integrity: Goal: Risk for impaired skin integrity will decrease Outcome: Progressing   Problem: Education: Goal: Knowledge of disease or condition will improve Outcome: Progressing Goal: Knowledge of secondary prevention will improve Outcome: Progressing Goal: Knowledge of patient specific risk factors addressed and post discharge goals established will improve Outcome: Progressing Goal: Individualized Educational Video(s) Outcome: Progressing   Problem: Health Behavior/Discharge Planning: Goal: Ability to manage health-related needs will improve Outcome: Progressing   Problem: Self-Care: Goal: Ability to participate in self-care as  condition permits will improve Outcome: Progressing Goal: Ability to communicate needs accurately will improve Outcome: Progressing   Problem: Nutrition: Goal: Risk of aspiration will decrease Outcome: Progressing   Problem: Intracerebral Hemorrhage Tissue Perfusion: Goal: Complications of Intracerebral Hemorrhage will be minimized Outcome: Progressing

## 2019-11-13 NOTE — Evaluation (Signed)
Occupational Therapy Evaluation Patient Details Name: Richard [name] MRN: [id number] DOB: 1931/10/31 Today's Date: 11/13/2019    History of Present Illness Pt is an 85 year old male presenting with AMS, family found him in his garage, confused. MRI findings 11/12/19: Small focus of acute ischemia within the right precentral gyrus. PMH of of atrial fibrillation on Coumadin INR of 2.1 on this admission, diabetes, and hypertension.   Clinical Impression   Pt is 85year old male who presents with new AMS and family found him in his garage confused and MRI indicated acute ischemia within the R precentral gyrus. He lives at home with a caregiver Rodena Piety (24/7) and his sons live close by.  He has intermittent confusion and did not know what the month or year was but knew he was in the hospital and why and knew that we just celebrated "[place] day".  He was able to write his name but had difficulty with writing his DOB or drawing a clock to show 2:00pm.  He is able to complete basic self care skills with extra time for processing and motor planning and needed min assist for LB dressing and balance.  Tasks requiring planning are difficult.  Rec continued OT while in hospital to continue to work on increasing independence in ADLs, balance and functional mobility training, coordination exercises and family ed and training and no follow after discharge since he has a caregiver 24/7 and will most likely be back to baseline again.    Follow Up Recommendations  No OT follow up    Equipment Recommendations       Recommendations for Other Services       Precautions / Restrictions Precautions Precautions: Fall Restrictions Weight Bearing Restrictions: No      Mobility Bed Mobility Overal bed mobility: Needs Assistance Bed Mobility: Supine to Sit     Supine to sit: Modified independent (Device/Increase time)     General bed mobility comments: Increased time needed, use of  handrails  Transfers Overall transfer level: Needs assistance Equipment used: Rolling walker (2 wheeled) Transfers: Sit to/from Stand Sit to Stand: Min assist         General transfer comment: minA needed for initiation, able to complete transfer and establish standing balance following    Balance Overall balance assessment: Needs assistance Sitting-balance support: Feet supported Sitting balance-Leahy Scale: Good   Postural control: Right lateral lean Standing balance support: No upper extremity supported Standing balance-Leahy Scale: Fair                             ADL either performed or assessed with clinical judgement   ADL Overall ADL's : Needs assistance/impaired Eating/Feeding: Set up;Independent;Sitting Eating/Feeding Details (indicate cue type and reason): poor appetite but able to use spoon to feed self fruit cup with R hand. Grooming: Wash/dry hands;Wash/dry face;Oral care;Brushing hair;Set up;Minimal assistance;Sitting Grooming Details (indicate cue type and reason): Delayed initiation and response time but able to complete with extra time with cues.         Upper Body Dressing : Set up;Min guard;Sitting   Lower Body Dressing: Minimal assistance;Set up;Sit to/from stand Lower Body Dressing Details (indicate cue type and reason): for balance when standing from recliner             Functional mobility during ADLs: Minimal assistance;Set up;Rolling walker;Cueing for safety       Vision Patient Visual Report: No change from baseline  Perception     Praxis      Pertinent Vitals/Pain Pain Assessment: No/denies pain     Hand Dominance Right   Extremity/Trunk Assessment Upper Extremity Assessment Upper Extremity Assessment: Overall WFL for tasks assessed   Lower Extremity Assessment Lower Extremity Assessment: Defer to PT evaluation   Cervical / Trunk Assessment Cervical / Trunk Assessment: Normal   Communication  Communication Communication: (mild delay in response time with intermittent confusion)   Cognition Arousal/Alertness: Awake/alert Behavior During Therapy: WFL for tasks assessed/performed Overall Cognitive Status: No family/caregiver present to determine baseline cognitive functioning                                 General Comments: Unsure of baseline cognition. Oriented close to correct (ie one day off on his birthday, initially says "I live with 71men and a polar bear" but eventually able to say he lives with a nurse and recalls her telephone number)   General Comments       Exercises Other Exercises Other Exercises: Supine to sit, patient able to follow cuing to sit with handrail support without need for physical assitance Other Exercises: STS able to comply with cuing for hand placement to complete stand, minA for initiation. ABle to sit with control following heavy PT cuing for safety Other Exercises: AMb with RW, heavy cuing for sequencing, ppatient drifts to the R but is able to correct before running into wall/obstacle. Heavy cuing for foot clearance and to "keep feet inside RW"   Shoulder Instructions      Home Living Family/patient expects to be discharged to:: Private residence Living Arrangements: Alone;Other (Comment) Available Help at Discharge: Family;Personal care attendant Type of Home: House Home Access: Stairs to enter Entrance Stairs-Number of Steps: 1 Entrance Stairs-Rails: None Home Layout: One level     Bathroom Shower/Tub: Occupational psychologist: Standard Bathroom Accessibility: Yes How Accessible: Accessible via walker Home Equipment: [place] - 2 wheels;Walker - 4 wheels   Additional Comments: Reports there are grab bars all around home, even in garage.      Prior Functioning/Environment Level of Independence: Needs assistance  Gait / Transfers Assistance Needed: Needs assistance from son for transfers, able to ambulate to  bathroom/household distances with caregiver and son with [phone].  ADL's / Homemaking Assistance Needed: Per nurse: patient feeds himself, can dress himself but she usually does this, nurse helps patient bathe            OT Problem List: Decreased activity tolerance;Impaired balance (sitting and/or standing);Decreased safety awareness;Decreased cognition      OT Treatment/Interventions: Self-care/ADL training;Patient/family education;Therapeutic activities    OT Goals(Current goals can be found in the care plan section) Acute Rehab OT Goals Patient Stated Goal: Return home independently OT Goal Formulation: With patient Time For Goal Achievement: 11/27/19 Potential to Achieve Goals: Good ADL Goals Pt Will Perform Grooming: with set-up;with supervision;sitting Pt Will Perform Lower Body Dressing: with set-up;with supervision;sit to/from stand Pt Will Transfer to Toilet: with set-up;with supervision;regular height toilet;stand pivot transfer  OT Frequency: Min 1X/week   Barriers to D/C:            Co-evaluation              AM-PAC OT "6 Clicks" Daily Activity     Outcome Measure Help from another person eating meals?: A Little Help from another person taking care of personal grooming?: A Little Help from another person  toileting, which includes using toliet, bedpan, or urinal?: A Little Help from another person bathing (including washing, rinsing, drying)?: A Little Help from another person to put on and taking off regular upper body clothing?: None Help from another person to put on and taking off regular lower body clothing?: A Little 6 Click Score: 19   End of Session Equipment Utilized During Treatment: Gait belt  Activity Tolerance: Patient tolerated treatment well Patient left: in chair;with call bell/phone within reach;with chair alarm set  OT Visit Diagnosis: Unsteadiness on feet (R26.81)                Time: 9179-1505 OT Time Calculation (min): 30  min Charges:  OT General Charges $OT Visit: 1 Visit OT Evaluation $OT Eval Low Complexity: 1 Low OT Treatments $Self Care/Home Management : 8-22 mins  Chrys Racer, OTR/L, [place] ascom [phone] 11/13/19, 3:06 PM

## 2019-11-14 MED ORDER — POLYETHYLENE GLYCOL 3350 17 G PO PACK
17.0000 g | PACK | Freq: Every day | ORAL | Status: DC
  Administered 2019-11-14 – 2019-11-15 (×2): 17 g via ORAL
  Filled 2019-11-14 (×2): qty 1

## 2019-11-14 NOTE — Progress Notes (Signed)
Pt moved to RM 146 in bed due to pt raising the height of his bed too high with IV pole on HOB breaking light fixture over the bed. No injuries. Repair request has been submitted.

## 2019-11-14 NOTE — Progress Notes (Addendum)
Alert, oriented to basic information, pleasant; forgetful-talks around with some discrepancies in recall (? Baseline). NIH-1. VSS with borderline elevated BP. Stroke education continued and reinforced. Neuro checks stable. Up to chair x 1 hour requiring 1+ to stand and some unsteadiness. In and Out Cath performed with 1375 cloudy, malodorous urine with purulent drainage at end of cath- pt has no sensation/awareness of fullness. Will recath in approximately 6 hours prn.  Urine culture results pending with >100,000 neg rods. Pt is on Rocephin IV. IVF's continued with creatinine slowly improving- 2.10 this a.m. Miralax started with no BM since admission.  Anticipating possible discharge home with private cg tomorrow.

## 2019-11-14 NOTE — Progress Notes (Signed)
PROGRESS NOTE    Richard [name]  VCB:[id number] DOB: 05/07/1931 DOA: 11/12/2019 PCP: [name], MD   Brief Narrative:  85 year old male with past medical history significant for A. fib, history of PE on Coumadinwhich had been stopped in January due to subdural hematoma, diabetes, hypertensionh/oosteomyelitis of right foot toe status post amputation presented with altered mental status and found to have right precentral gyrus stroke most likely embolic on MRI.  No other focal deficit.  Subjective: Patient was feeling better when seen this morning.  Has no new complaints.  He does in and out catheterization at home.  Denies any urinary symptoms. Nursing concern with urinary retention and seeing purulent discharge on ended out catheter.  UA looked infected and culture was sent.  Assessment & Plan:   Active Problems:   Altered mental status   Acute ischemic stroke (HCC)   Urinary tract infection without hematuria   AKI (acute kidney injury) ([place])   Stroke (cerebrum) (HCC)  Acute stroke.  Likely embolic in the setting of A. fib.  Carotid Doppler without any significant stenosis.  TSH within normal limit.  Echo was normal.  A1c of 7 and hyperlipidemia on lipid profile. No new focal deficit. Neurology is following-appreciate their recommendations. -He was started on aspirin and high intensity statin.  UTI.  Patient self catheterizes himself due to neurogenic bladder.  UA concerning for infection and culture growing gram-negative rods.  No urinary symptoms.  UTI in elderly can present as altered mental status. -Continue ceftriaxone for now-we will adjust antibiotics once sensitivities available.  Diabetes type 2 with neuropathy.  A1c of 7. We are holding his glimepiride in hospital and blood sugar will be maintained with SSI.  Acute on chronic kidney disease stage III.  Continue to improving.  Close to his baseline around 2 now. -Continue to monitor. -Avoid  nephrotoxic.  History of recurrent pulmonary embolism due to factor V Leiden mutation- History of subdural hematoma in January 2020 for which Coumadin was held for about 2 weeks -since then he has been back on it -INR was 2.1. - continue Coumadin  Objective: Vitals:   11/13/19 2100 11/14/19 0711 11/14/19 0812 11/14/19 1114  BP: (!) 152/74 (!) 169/85 (!) 178/84 (!) 173/89  Pulse: (!) 55 (!) 59 (!) 50 (!) 52  Resp: 16 20 18 20   Temp: 98.2 F (36.8 C) (!) 97.3 F (36.3 C) 98.2 F (36.8 C) (!) 97.4 F (36.3 C)  TempSrc: Oral Oral  Oral  SpO2: 100% 99% 98% 100%  Weight:      Height:        Intake/Output Summary (Last 24 hours) at 11/14/2019 1250 Last data filed at 11/14/2019 1149 Gross per 24 hour  Intake 1957.16 ml  Output 3195 ml  Net -1237.84 ml   Filed Weights   11/12/19 1822  Weight: 89.5 kg    Examination:  General exam: Pleasant elderly gentleman, appears calm and comfortable  Respiratory system: Clear to auscultation. Respiratory effort normal. Cardiovascular system: S1 & S2 heard, RRR. No JVD, murmurs, rubs, gallops or clicks. No pedal edema. Gastrointestinal system: Abdomen is nondistended, soft and nontender. No organomegaly or masses felt. Normal bowel sounds heard. Central nervous system: Alert and oriented. No focal neurological deficits. Extremities: Symmetric 5 x 5 power. Skin: No rashes, lesions or ulcers Psychiatry: Judgement and insight appear normal. Mood & affect appropriate.   DVT prophylaxis: Coumadin Code Status: DNR Family Communication: Family at bedside, discussed with patient. Disposition Plan: Most likely home tomorrow with  home health.  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  Consultants:   Neurology  Procedures:   Antimicrobials:  Ceftriaxone  Data Reviewed: I have personally reviewed following labs and imaging studies  CBC: Recent Labs  Lab 11/12/19 1531 11/13/19 1215  WBC 9.6 10.4  NEUTROABS 6.2  --   HGB 15.4  14.6  HCT 45.2 42.6  MCV 94.2 93.4  PLT 177 902   Basic Metabolic Panel: Recent Labs  Lab 11/12/19 1531 11/13/19 1215  NA 137 138  K 4.9 4.7  CL 105 110  CO2 22 19*  GLUCOSE 262* 168*  BUN 50* 44*  CREATININE 2.27* 2.10*  CALCIUM 8.6* 8.4*   GFR: Estimated Creatinine Clearance: 29.4 mL/min (A) (by C-G formula based on SCr of 2.1 mg/dL (H)). Liver Function Tests: Recent Labs  Lab 11/12/19 1531  AST 25  ALT 15  ALKPHOS 83  BILITOT 0.7  PROT 7.1  ALBUMIN 3.0*   No results for input(s): LIPASE, AMYLASE in the last 168 hours. No results for input(s): AMMONIA in the last 168 hours. Coagulation Profile: Recent Labs  Lab 11/12/19 1531 11/13/19 1215  INR 2.1* 2.3*   Cardiac Enzymes: Recent Labs  Lab 11/12/19 1531  CKTOTAL 72   BNP (last 3 results) No results for input(s): PROBNP in the last 8760 hours. HbA1C: Recent Labs    11/12/19 1531 11/13/19 0450  HGBA1C 7.1* 7.0*   CBG: No results for input(s): GLUCAP in the last 168 hours. Lipid Profile: Recent Labs    11/13/19 0450  CHOL 221*  HDL 36*  LDLCALC 156*  TRIG 147  CHOLHDL 6.1   Thyroid Function Tests: Recent Labs    11/12/19 1531  TSH 1.518   Anemia Panel: No results for input(s): VITAMINB12, FOLATE, FERRITIN, TIBC, IRON, RETICCTPCT in the last 72 hours. Sepsis Labs: Recent Labs  Lab 11/12/19 1531 11/12/19 1751  LATICACIDVEN 2.3* 1.7    Recent Results (from the past 240 hour(s))  Urine culture     Status: Abnormal (Preliminary result)   Collection Time: 11/12/19  3:32 PM   Specimen: Urine, Random  Result Value Ref Range Status   Specimen Description   Final    URINE, RANDOM Performed at [facility], [address] [place], [place] [address]    Special Requests   Final    NONE Performed at [facility], [address], [place], [place] [address]    Culture (A)  Final    >=100,000 COLONIES/mL GRAM NEGATIVE RODS CULTURE REINCUBATED FOR BETTER  GROWTH Performed at [facility], [place] [address] [place], [place] [address]    Report Status PENDING  Incomplete  SARS CORONAVIRUS 2 (TAT 6-24 HRS) Nasopharyngeal Nasopharyngeal Swab     Status: None   Collection Time: 11/12/19  5:57 PM   Specimen: Nasopharyngeal Swab  Result Value Ref Range Status   SARS Coronavirus 2 NEGATIVE NEGATIVE Final    Comment: (NOTE) SARS-CoV-2 target nucleic acids are NOT DETECTED. The SARS-CoV-2 RNA is generally detectable in upper and lower respiratory specimens during the acute phase of infection. Negative results do not preclude SARS-CoV-2 infection, do not rule out co-infections with other pathogens, and should not be used as the sole basis for treatment or other patient management decisions. Negative results must be combined with clinical observations, patient history, and epidemiological information. The expected result is Negative. Fact Sheet for Patients: SugarRoll.be Fact Sheet for Healthcare Providers: https://www.woods-mathews.com/ This test is not yet approved or cleared by the  [place] Architectural technologist and  has been authorized for detection and/or diagnosis of SARS-CoV-2 by FDA under an Print production planner (EUA). This EUA will remain  in effect (meaning this test can be used) for the duration of the COVID-19 declaration under Section 56 4(b)(1) of the Act, 21 U.S.C. section 360bbb-3(b)(1), unless the authorization is terminated or revoked sooner. Performed at [facility], [place] [address] [place], [place] [address]      Radiology Studies: Ct Head Wo Contrast  Result Date: 11/12/2019 CLINICAL DATA:  Altered level of consciousness (LOC), unexplained EXAM: CT HEAD WITHOUT CONTRAST TECHNIQUE: Contiguous axial images were obtained from the base of the skull through the vertex without intravenous contrast. COMPARISON:  Head CT and brain MRI 12/30/2018 FINDINGS: Brain: Previous right  subdural hematoma has diminished in size, small residual chronic subdural collection measures 3 mm in the parietal frontal region. No hyperdensity to suggest acute hemorrhage component. Unchanged generalized atrophy and chronic small vessel ischemia. No evidence of acute infarct. No hydrocephalus or midline shift. Vascular: Atherosclerosis of skullbase vasculature without hyperdense vessel or abnormal calcification. Skull: No fracture or focal lesion. Sinuses/Orbits: Mild mucosal thickening with small fluid levels in the right and left sphenoid sinus. Remaining visualized paranasal sinuses are clear. Opacification of lower left and right mastoid air cells, unchanged. Other: None. IMPRESSION: 1.  No acute intracranial abnormality. 2. Small right chronic subdural hematoma, diminished in size since prior imaging. No acute component, midline shift or mass effect. 3. Stable atrophy and chronic small vessel ischemia. 4. Mucosal thickening with small fluid levels in the sphenoid sinus, can be seen with acute sinusitis in the appropriate clinical setting. Electronically Signed   By: Keith Rake M.D.   On: 11/12/2019 15:47   Mr Brain Wo Contrast  Result Date: 11/12/2019 CLINICAL DATA:  Altered mental status EXAM: MRI HEAD WITHOUT CONTRAST TECHNIQUE: Multiplanar, multiecho pulse sequences of the brain and surrounding structures were obtained without intravenous contrast. COMPARISON:  Brain MRI 12/31/2018 FINDINGS: Brain: There is a small focus abnormal diffusion restriction within the right precentral gyrus (series 2, image 43). No acute hemorrhage. Early confluent hyperintense T2-weighted signal of the periventricular and deep white matter, most commonly due to chronic ischemic microangiopathy. There is generalized atrophy without lobar predilection. Blood-sensitive sequences show no chronic microhemorrhage or superficial siderosis. The midline structures are normal. Vascular: Normal flow voids. Skull and upper  cervical spine: Normal marrow signal. Sinuses/Orbits: Negative. Other: None. IMPRESSION: 1. Small focus of acute ischemia within the right precentral gyrus. No hemorrhage or mass effect. 2. Generalized atrophy and chronic ischemic microangiopathy. Electronically Signed   By: Ulyses Jarred M.D.   On: 11/12/2019 22:54   US Carotid Bilateral (at [facility] And [facility] Only)  Result Date: 11/13/2019 CLINICAL DATA:  Altered mental status, hypertension and diabetes. EXAM: BILATERAL CAROTID DUPLEX ULTRASOUND TECHNIQUE: Pearline Cables scale imaging, color Doppler and duplex ultrasound were performed of bilateral carotid and vertebral arteries in the neck. COMPARISON:  01/01/2019 FINDINGS: Criteria: Quantification of carotid stenosis is based on velocity parameters that correlate the residual internal carotid diameter with NASCET-based stenosis levels, using the diameter of the distal internal carotid lumen as the denominator for stenosis measurement. The following velocity measurements were obtained: RIGHT ICA:  92/16 cm/sec CCA:  41/93 cm/sec SYSTOLIC ICA/CCA RATIO:  1.2 ECA:  162 cm/sec LEFT ICA:  94/13 cm/sec CCA:  79/02 cm/sec SYSTOLIC ICA/CCA RATIO:  1.0 ECA:  87 cm/sec RIGHT CAROTID ARTERY: Mild amount of partially calcified plaque at the level  of the proximal right ICA. Estimated right ICA stenosis is less than 50%. RIGHT VERTEBRAL ARTERY: Antegrade flow with normal waveform and velocity. LEFT CAROTID ARTERY: Mild calcified plaque at the level of the carotid bulb and proximal left ICA. Estimated left ICA stenosis is less than 50%. LEFT VERTEBRAL ARTERY: Antegrade flow with normal waveform and velocity. IMPRESSION: Mild plaque at the level of both proximal internal carotid arteries with estimated bilateral ICA stenoses of less than 50%. Electronically Signed   By: Aletta Edouard M.D.   On: 11/13/2019 09:21    Scheduled Meds:  aspirin  81 mg Oral Daily   atorvastatin  40 mg Oral Daily   cholecalciferol  2,000 Units Oral  Daily   febuxostat  80 mg Oral Daily   lacosamide  100 mg Oral BID   metoprolol tartrate  12.5 mg Oral BID   omega-3 acid ethyl esters  1,000 mg Oral Daily   polyethylene glycol  17 g Oral Daily   senna-docusate  1 tablet Oral BID   Continuous Infusions:  sodium chloride 75 mL/hr at 11/14/19 1120   cefTRIAXone (ROCEPHIN)  IV Stopped (11/13/19 1629)     LOS: 1 day   Time spent: 40 minutes.  I personally reviewed his chart.  Lorella Nimrod, MD [facility] Pager [phone]  If 7PM-7AM, please contact night-coverage [email or web address] Password [facility] 11/14/2019, 12:50 PM   This record has been created using Systems analyst. Errors have been sought and corrected,but may not always be located. Such creation errors do not reflect on the standard of care.

## 2019-11-15 LAB — CBC
HCT: 44.8 % (ref 39.0–52.0)
Hemoglobin: 15.1 g/dL (ref 13.0–17.0)
MCH: 32.3 pg (ref 26.0–34.0)
MCHC: 33.7 g/dL (ref 30.0–36.0)
MCV: 95.7 fL (ref 80.0–100.0)
Platelets: 167 10*3/uL (ref 150–400)
RBC: 4.68 MIL/uL (ref 4.22–5.81)
RDW: 12.7 % (ref 11.5–15.5)
WBC: 9.3 10*3/uL (ref 4.0–10.5)
nRBC: 0 % (ref 0.0–0.2)

## 2019-11-15 LAB — PROTIME-INR
INR: 1.6 — ABNORMAL HIGH (ref 0.8–1.2)
Prothrombin Time: 18.8 seconds — ABNORMAL HIGH (ref 11.4–15.2)

## 2019-11-15 MED ORDER — LISINOPRIL 10 MG PO TABS
10.0000 mg | ORAL_TABLET | Freq: Every day | ORAL | Status: DC
  Administered 2019-11-15 – 2019-11-16 (×2): 10 mg via ORAL
  Filled 2019-11-15 (×2): qty 1

## 2019-11-15 MED ORDER — WARFARIN SODIUM 5 MG PO TABS
5.0000 mg | ORAL_TABLET | Freq: Once | ORAL | Status: AC
  Administered 2019-11-15: 18:00:00 5 mg via ORAL
  Filled 2019-11-15: qty 1

## 2019-11-15 MED ORDER — WARFARIN - PHARMACIST DOSING INPATIENT
Freq: Every day | Status: DC
  Administered 2019-11-15: 18:00:00

## 2019-11-15 MED ORDER — HYDRALAZINE HCL 20 MG/ML IJ SOLN
5.0000 mg | INTRAMUSCULAR | Status: DC | PRN
  Administered 2019-11-15: 5 mg via INTRAVENOUS
  Filled 2019-11-15 (×2): qty 0.25

## 2019-11-15 MED ORDER — LISINOPRIL 5 MG PO TABS: 5.0000 mg | ORAL_TABLET | Freq: Every day | ORAL | Status: DC

## 2019-11-15 NOTE — Progress Notes (Signed)
PROGRESS NOTE    Richard [name]  YCX:[id number] DOB: [birth year] DOA: 11/12/2019 PCP: [name], MD   Brief Narrative:  85 year old male with past medical history significant for A. fib, history of PE on Coumadinwhich had been stopped in January due to subdural hematoma, diabetes, hypertensionh/oosteomyelitis of right foot toe status post amputation presented with altered mental status and found to have right precentral gyrus stroke most likely embolic on MRI.  No other focal deficit.  Subjective: Patient was feeling better when seen this morning.  Has no new complaints.   Denies any urinary symptoms.  Continue to get in and out catheter without any difficulty.  Assessment & Plan:   Active Problems:   Altered mental status   Acute ischemic stroke (HCC)   Urinary tract infection without hematuria   AKI (acute kidney injury) ([place])   Stroke (cerebrum) (HCC)  Acute stroke.  Likely embolic in the setting of A. fib.  Carotid Doppler without any significant stenosis.  TSH within normal limit.  Echo was normal.  A1c of 7 and hyperlipidemia on lipid profile. No new focal deficit. Neurology is following-appreciate their recommendations. -He was started on aspirin and high intensity statin.  UTI.  Patient self catheterizes himself due to neurogenic bladder.  UA concerning for infection and culture growing E. coli and Klebsiella pneumonia. Patient did had Klebsiella pneumonia on his prior urine culture, E. coli is new.  No urinary symptoms.  UTI in elderly can present as altered mental status. -Continue ceftriaxone for now-we will adjust antibiotics once sensitivities available.  Talked with microbiology, his sensitivity results will be available by tomorrow.  Diabetes type 2 with neuropathy.  A1c of 7. We are holding his glimepiride in hospital and blood sugar will be maintained with SSI.  Acute on chronic kidney disease stage III.  Continue to improving.  Close to his baseline around  2 now. -Continue to monitor. -Avoid nephrotoxic.  History of recurrent pulmonary embolism due to factor V Leiden mutation- History of subdural hematoma in January 2020 for which Coumadin was held for about 2 weeks -since then he has been back on it -INR was 2.1. - continue Coumadin  Objective: Vitals:   11/14/19 1513 11/14/19 1637 11/14/19 2226 11/15/19 0907  BP: (!) 151/80 (!) 187/85 (!) 175/92 (!) 187/101  Pulse: 62 73 75 (!) 58  Resp: 20 16 20 17   Temp: (!) 97.3 F (36.3 C) 98.8 F (37.1 C) 98.3 F (36.8 C) 98.7 F (37.1 C)  TempSrc: Oral  Oral Oral  SpO2: 98% 97% 93% 100%  Weight:      Height:        Intake/Output Summary (Last 24 hours) at 11/15/2019 1332 Last data filed at 11/15/2019 0900 Gross per 24 hour  Intake 1508.52 ml  Output 1875 ml  Net -366.48 ml   Filed Weights   11/12/19 1822  Weight: 89.5 kg    Examination:  General exam: Pleasant elderly gentleman, appears calm and comfortable  Respiratory system: Clear to auscultation. Respiratory effort normal. Cardiovascular system: S1 & S2 heard, RRR. No JVD, murmurs, rubs, gallops or clicks. No pedal edema. Gastrointestinal system: Abdomen is nondistended, soft and nontender. No organomegaly or masses felt. Normal bowel sounds heard. Central nervous system: Alert and oriented. No focal neurological deficits. Extremities: Symmetric 5 x 5 power. Skin: No rashes, lesions or ulcers Psychiatry: Judgement and insight appear normal. Mood & affect appropriate.   DVT prophylaxis: Coumadin Code Status: DNR Family Communication: Family at bedside, discussed with patient.  Disposition Plan: Most likely home tomorrow with home health.  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  Consultants:   Neurology  Procedures:   Antimicrobials:  Ceftriaxone  Data Reviewed: I have personally reviewed following labs and imaging studies  CBC: Recent Labs  Lab 11/12/19 1531 11/13/19 1215  WBC 9.6 10.4  NEUTROABS  6.2  --   HGB 15.4 14.6  HCT 45.2 42.6  MCV 94.2 93.4  PLT 177 277   Basic Metabolic Panel: Recent Labs  Lab 11/12/19 1531 11/13/19 1215  NA 137 138  K 4.9 4.7  CL 105 110  CO2 22 19*  GLUCOSE 262* 168*  BUN 50* 44*  CREATININE 2.27* 2.10*  CALCIUM 8.6* 8.4*   GFR: Estimated Creatinine Clearance: 29.4 mL/min (A) (by C-G formula based on SCr of 2.1 mg/dL (H)). Liver Function Tests: Recent Labs  Lab 11/12/19 1531  AST 25  ALT 15  ALKPHOS 83  BILITOT 0.7  PROT 7.1  ALBUMIN 3.0*   No results for input(s): LIPASE, AMYLASE in the last 168 hours. No results for input(s): AMMONIA in the last 168 hours. Coagulation Profile: Recent Labs  Lab 11/12/19 1531 11/13/19 1215  INR 2.1* 2.3*   Cardiac Enzymes: Recent Labs  Lab 11/12/19 1531  CKTOTAL 72   BNP (last 3 results) No results for input(s): PROBNP in the last 8760 hours. HbA1C: Recent Labs    11/12/19 1531 11/13/19 0450  HGBA1C 7.1* 7.0*   CBG: No results for input(s): GLUCAP in the last 168 hours. Lipid Profile: Recent Labs    11/13/19 0450  CHOL 221*  HDL 36*  LDLCALC 156*  TRIG 147  CHOLHDL 6.1   Thyroid Function Tests: Recent Labs    11/12/19 1531  TSH 1.518   Anemia Panel: No results for input(s): VITAMINB12, FOLATE, FERRITIN, TIBC, IRON, RETICCTPCT in the last 72 hours. Sepsis Labs: Recent Labs  Lab 11/12/19 1531 11/12/19 1751  LATICACIDVEN 2.3* 1.7    Recent Results (from the past 240 hour(s))  Urine culture     Status: Abnormal (Preliminary result)   Collection Time: 11/12/19  3:32 PM   Specimen: Urine, Random  Result Value Ref Range Status   Specimen Description   Final    URINE, RANDOM Performed at [facility], [address] [place], [place] [address]    Special Requests   Final    NONE Performed at [facility], [address] [place], [place] [address]    Culture (A)  Final    >=100,000 COLONIES/mL ESCHERICHIA COLI >=100,000 COLONIES/mL  KLEBSIELLA PNEUMONIAE SUSCEPTIBILITIES TO FOLLOW Performed at [facility], [place] [address] [place], [place] [address]    Report Status PENDING  Incomplete  SARS CORONAVIRUS 2 (TAT 6-24 HRS) Nasopharyngeal Nasopharyngeal Swab     Status: None   Collection Time: 11/12/19  5:57 PM   Specimen: Nasopharyngeal Swab  Result Value Ref Range Status   SARS Coronavirus 2 NEGATIVE NEGATIVE Final    Comment: (NOTE) SARS-CoV-2 target nucleic acids are NOT DETECTED. The SARS-CoV-2 RNA is generally detectable in upper and lower respiratory specimens during the acute phase of infection. Negative results do not preclude SARS-CoV-2 infection, do not rule out co-infections with other pathogens, and should not be used as the sole basis for treatment or other patient management decisions. Negative results must be combined with clinical observations, patient history, and epidemiological information. The expected result is Negative. Fact Sheet for Patients: SugarRoll.be Fact Sheet for Healthcare Providers: https://www.woods-mathews.com/ This test  is not yet approved or cleared by the [place] and  has been authorized for detection and/or diagnosis of SARS-CoV-2 by FDA under an Emergency Use Authorization (EUA). This EUA will remain  in effect (meaning this test can be used) for the duration of the COVID-19 declaration under Section 56 4(b)(1) of the Act, 21 U.S.C. section 360bbb-3(b)(1), unless the authorization is terminated or revoked sooner. Performed at [facility], [place] [address], [place], [place] [address]      Radiology Studies: No results found.  Scheduled Meds: . aspirin  81 mg Oral Daily  . atorvastatin  40 mg Oral Daily  . cholecalciferol  2,000 Units Oral Daily  . febuxostat  80 mg Oral Daily  . lacosamide  100 mg Oral BID  . lisinopril  10 mg Oral Daily  . metoprolol tartrate  12.5 mg Oral BID  . omega-3 acid ethyl  esters  1,000 mg Oral Daily  . polyethylene glycol  17 g Oral Daily  . senna-docusate  1 tablet Oral BID   Continuous Infusions: . sodium chloride Stopped (11/15/19 0702)  . cefTRIAXone (ROCEPHIN)  IV 1 g (11/14/19 1509)     LOS: 2 days   Time spent: 35 minutes.    Lorella Nimrod, MD [facility] Pager [phone]  If 7PM-7AM, please contact night-coverage [email or web address] Password [facility] 11/15/2019, 1:32 PM   This record has been created using Systems analyst. Errors have been sought and corrected,but may not always be located. Such creation errors do not reflect on the standard of care.

## 2019-11-15 NOTE — TOC Initial Note (Signed)
Transition of Care [facility]) - Initial/Assessment Note    Patient Details  Name: Richard [name] MRN: [id number] Date of Birth: Jul 13, 1931  Transition of Care [facility] CM/SW Contact:    Su Hilt, RN Phone Number: 11/15/2019, 10:21 AM  Clinical Narrative:                 Spoke with his son Richard [name], The patient lives alone but has a caregiver each day, the son is going to increase the amount of time a caregiver is there to 24/7 The patient has a Toilet riser and a RW, he has brag bars throughout the house.  No additional DME needed The patient will like to have [facility] PT and has used [facility] in the past and would like them again, I notified [place] and [place] The son stated that he was concerned due to the patient having had strokes in the past.  I encouraged him to call and speak to the nurse or Doctor about what to expect medically  Expected Discharge Plan: [employer] Barriers to Discharge: Continued Medical Work up   Patient Goals and CMS Choice Patient states their goals for this hospitalization and ongoing recovery are:: go home CMS Medicare.gov Compare Post Acute Care list provided to:: Patient Choice offered to / list presented to : Patient  Expected Discharge Plan and Services Expected Discharge Plan: [place]   Discharge Planning Services: CM Consult Post Acute Care Choice: [place] arrangements for the past 2 months: Single Family Home                 DME Arranged: N/A         HH Arranged: PT HH Agency: [place]) Date HH Agency Contacted: 11/15/19 Time HH Agency Contacted: 20 Representative spoke with at [place]: Corene Cornea  Prior Living Arrangements/Services Living arrangements for the past 2 months: [place] with:: Self Patient language and need for interpreter reviewed:: Yes Do you feel safe going back to the place where you live?: Yes      Need for Family Participation in  Patient Care: No (Comment) Care giver support system in place?: Yes (comment) Current home services: DME(toilet riser and 4 WW, grab bars) Criminal Activity/Legal Involvement Pertinent to Current Situation/Hospitalization: No - Comment as needed  Activities of Daily Living Home Assistive Devices/Equipment: Dentures (specify type) ADL Screening (condition at time of admission) Patient's cognitive ability adequate to safely complete daily activities?: No Is the patient deaf or have difficulty hearing?: No Does the patient have difficulty seeing, even when wearing glasses/contacts?: No Does the patient have difficulty concentrating, remembering, or making decisions?: Yes Patient able to express need for assistance with ADLs?: Yes Does the patient have difficulty dressing or bathing?: Yes Independently performs ADLs?: Yes (appropriate for developmental age) Does the patient have difficulty walking or climbing stairs?: Yes Weakness of Legs: Both Weakness of Arms/Hands: None  Permission Sought/Granted   Permission granted to share information with : Yes, Verbal Permission Granted              Emotional Assessment Appearance:: Appears stated age Attitude/Demeanor/Rapport: Engaged Affect (typically observed): Appropriate Orientation: : Oriented to Self, Oriented to Place Alcohol / Substance Use: Not Applicable Psych Involvement: No (comment)  Admission diagnosis:  Urinary tract infection without hematuria, site unspecified [N39.0] Altered mental status, unspecified altered mental status type [R41.82] Patient Active Problem List   Diagnosis Date Noted  . Stroke (cerebrum) ([name])  11/13/2019  . Acute ischemic stroke ([name])   . Urinary tract infection without hematuria   . AKI (acute kidney injury) ([name])   . Altered mental status 11/12/2019  . Subdural hematoma ([name]) 12/28/2018  . PAF (paroxysmal atrial fibrillation) ([name]) 12/28/2018  . Diabetes ([name]) 12/28/2018  . CKD (chronic  kidney disease), stage III 12/28/2018  . HTN (hypertension) 12/28/2018  . Pressure injury of skin 11/30/2018  . Acute osteomyelitis of toe of right foot ([place]) 11/28/2018  . Malignant tumor of prostate ([name]) 10/26/2015  . Urinary tract infection with hematuria 01/11/2015  . Bladder neck obstruction 11/04/2012  . 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 Prostate cancer ([name]) 11/04/2012  . Retention of urine 05/06/2012  . History of hematuria 01/30/2012  . Malignant neoplasm of prostate ([name]) 09/30/2011  . Gross hematuria 09/19/2011  . Benign prostatic hyperplasia with urinary retention 09/10/2011  . Elevated prostate specific antigen (PSA) 09/10/2011   PCP:  [name], MD Pharmacy:   [id number] [address], [place] [address] Phone: [phone] Fax: [phone]     Social Determinants of Health (SDOH) Interventions    Readmission Risk Interventions No flowsheet data found.

## 2019-11-15 NOTE — Discharge Summary (Signed)
Physician Discharge Summary  Richard [name] WUJ:[id number] DOB: 1931/06/25 DOA: 11/12/2019  PCP: [name], MD  Admit date: 11/12/2019 Discharge date: 11/16/2019  Admitted From: Home Disposition: Home  Recommendations for Outpatient Follow-up:  1. Follow up with PCP in 1-2 weeks 2. Please obtain BMP/CBC in one week 3. Please follow up on the following pending results:  Home Health: Yes Equipment/Devices: None Discharge Condition: Stable CODE STATUS: DNR Diet recommendation: Heart Healthy / Carb Modified   Brief/Interim Summary: 85 year old male with past medical history significant for A. fib, history of PE on Coumadinwhich had been stopped in January due to subdural hematoma, diabetes, hypertensionh/oosteomyelitis of right foot toe status post amputation presented with altered mental status and found to have right precentral gyrus stroke most likely embolic on MRI.  No other focal deficit. He was started on aspirin and high intensity statin.  Patient has an history of neurogenic bladder and performed regular in and out catheterization at home.  There was some purulent discharge during in and out catheter.  Patient remained asymptomatic.  UA was concerning for infection.  Urine culture grew Klebsiella pneumonia and E. coli.  With good sensitivity.  He has prior culture with Klebsiella pneumonia.  He was treated with ceftriaxone during hospitalization and completed his 5-day course.  Patient has type 2 diabetes with A1c of 7.  His diabetes was managed with SSI during hospitalization and he will resume glimepiride on discharge.  His antihypertensives were held initially for permissive hypertension and resumed after 48 hours.  We increased the dose of lisinopril because of persistently elevated blood pressure.  His PCP should be able to titrate according to his need.  We continue his home Coumadin which he was taking with history of recurrent PE due to factor V Leiden  mutation.  Patient has a full-time nursing care available at home.  He was discharged home with home health PT/OT.   Discharge Diagnoses:  Active Problems:   Altered mental status   Acute ischemic stroke (HCC)   Urinary tract infection without hematuria   AKI (acute kidney injury) ([place])   Stroke (cerebrum) [facility])   Discharge Instructions  Discharge Instructions    Diet - low sodium heart healthy   Complete by: As directed    Discharge instructions   Complete by: As directed    It was pleasure taking care of you. We are ordering some home health physical therapy to increase your endurance and strength. We did make some changes to your medications which include adding an aspirin, increasing the dose of Lipitor and lisinopril. You need to follow-up with your PCP within 1 to 2 weeks for your blood pressure needs and hospital follow-up.   Increase activity slowly   Complete by: As directed      Allergies as of 11/16/2019   No Known Allergies     Medication List    STOP taking these medications   Lacosamide 100 MG Tabs     TAKE these medications   Alpha-Lipoic Acid 200 MG Caps Take 1 capsule by mouth daily.   ALPRAZolam 0.25 MG tablet Commonly known as: XANAX Take 0.25 mg by mouth at bedtime as needed.   aspirin 81 MG chewable tablet Chew 1 tablet (81 mg total) by mouth daily. Start taking on: November 17, 2019   atorvastatin 40 MG tablet Commonly known as: LIPITOR Take 1 tablet (40 mg total) by mouth daily. Start taking on: November 17, 2019 What changed:   medication strength  how much to  take   CALCIUM 600 PO Take 1 tablet by mouth daily.   Cialis 5 MG tablet Generic drug: tadalafil TK 1/2 T PO QD   Coenzyme Q10 10 MG capsule Take 10 mg by mouth.   colchicine 0.6 MG tablet Take 0.6 mg by mouth.   glimepiride 4 MG tablet Commonly known as: AMARYL Take 4 mg by mouth daily with breakfast.   lisinopril 10 MG tablet Commonly known as: ZESTRIL Take 1  tablet (10 mg total) by mouth daily. Start taking on: November 17, 2019 What changed:   medication strength  how much to take   metoprolol tartrate 25 MG tablet Commonly known as: LOPRESSOR Take 0.5 tablets (12.5 mg total) by mouth 2 (two) times daily.   MULTIVITAMIN ADULT EXTRA C PO Take 1 tablet by mouth daily.   nystatin-triamcinolone cream Commonly known as: MYCOLOG II Apply to affected areas bid   Red Yeast Rice 600 MG Tabs Take 1 tablet by mouth daily.   senna-docusate 8.6-50 MG tablet Commonly known as: Senokot-S Take 1 tablet by mouth 2 (two) times daily.   Uloric 40 MG tablet Generic drug: febuxostat Take 1 tablet by mouth daily.   Vitamin A & D [address] units Tabs Take 1 tablet by mouth daily.   Vitamin D 50 MCG (2000 UT) tablet Take 2,000 Units by mouth daily.   warfarin 5 MG tablet Commonly known as: COUMADIN Take 5 mg by mouth daily.       No Known Allergies  Consultations:  Neurology  Procedures/Studies: Ct Head Wo Contrast  Result Date: 11/12/2019 CLINICAL DATA:  Altered level of consciousness (LOC), unexplained EXAM: CT HEAD WITHOUT CONTRAST TECHNIQUE: Contiguous axial images were obtained from the base of the skull through the vertex without intravenous contrast. COMPARISON:  Head CT and brain MRI 12/30/2018 FINDINGS: Brain: Previous right subdural hematoma has diminished in size, small residual chronic subdural collection measures 3 mm in the parietal frontal region. No hyperdensity to suggest acute hemorrhage component. Unchanged generalized atrophy and chronic small vessel ischemia. No evidence of acute infarct. No hydrocephalus or midline shift. Vascular: Atherosclerosis of skullbase vasculature without hyperdense vessel or abnormal calcification. Skull: No fracture or focal lesion. Sinuses/Orbits: Mild mucosal thickening with small fluid levels in the right and left sphenoid sinus. Remaining visualized paranasal sinuses are clear.  Opacification of lower left and right mastoid air cells, unchanged. Other: None. IMPRESSION: 1.  No acute intracranial abnormality. 2. Small right chronic subdural hematoma, diminished in size since prior imaging. No acute component, midline shift or mass effect. 3. Stable atrophy and chronic small vessel ischemia. 4. Mucosal thickening with small fluid levels in the sphenoid sinus, can be seen with acute sinusitis in the appropriate clinical setting. Electronically Signed   By: Keith Rake M.D.   On: 11/12/2019 15:47   Mr Brain Wo Contrast  Result Date: 11/12/2019 CLINICAL DATA:  Altered mental status EXAM: MRI HEAD WITHOUT CONTRAST TECHNIQUE: Multiplanar, multiecho pulse sequences of the brain and surrounding structures were obtained without intravenous contrast. COMPARISON:  Brain MRI 12/31/2018 FINDINGS: Brain: There is a small focus abnormal diffusion restriction within the right precentral gyrus (series 2, image 43). No acute hemorrhage. Early confluent hyperintense T2-weighted signal of the periventricular and deep white matter, most commonly due to chronic ischemic microangiopathy. There is generalized atrophy without lobar predilection. Blood-sensitive sequences show no chronic microhemorrhage or superficial siderosis. The midline structures are normal. Vascular: Normal flow voids. Skull and upper cervical spine: Normal marrow signal. Sinuses/Orbits: Negative. Other:  None. IMPRESSION: 1. Small focus of acute ischemia within the right precentral gyrus. No hemorrhage or mass effect. 2. Generalized atrophy and chronic ischemic microangiopathy. Electronically Signed   By: Ulyses Jarred M.D.   On: 11/12/2019 22:54   US Carotid Bilateral (at [facility] Only)  Result Date: 11/13/2019 CLINICAL DATA:  Altered mental status, hypertension and diabetes. EXAM: BILATERAL CAROTID DUPLEX ULTRASOUND TECHNIQUE: Pearline Cables scale imaging, color Doppler and duplex ultrasound were performed of bilateral carotid and  vertebral arteries in the neck. COMPARISON:  01/01/2019 FINDINGS: Criteria: Quantification of carotid stenosis is based on velocity parameters that correlate the residual internal carotid diameter with NASCET-based stenosis levels, using the diameter of the distal internal carotid lumen as the denominator for stenosis measurement. The following velocity measurements were obtained: RIGHT ICA:  92/16 cm/sec CCA:  51/88 cm/sec SYSTOLIC ICA/CCA RATIO:  1.2 ECA:  162 cm/sec LEFT ICA:  94/13 cm/sec CCA:  41/66 cm/sec SYSTOLIC ICA/CCA RATIO:  1.0 ECA:  87 cm/sec RIGHT CAROTID ARTERY: Mild amount of partially calcified plaque at the level of the proximal right ICA. Estimated right ICA stenosis is less than 50%. RIGHT VERTEBRAL ARTERY: Antegrade flow with normal waveform and velocity. LEFT CAROTID ARTERY: Mild calcified plaque at the level of the carotid bulb and proximal left ICA. Estimated left ICA stenosis is less than 50%. LEFT VERTEBRAL ARTERY: Antegrade flow with normal waveform and velocity. IMPRESSION: Mild plaque at the level of both proximal internal carotid arteries with estimated bilateral ICA stenoses of less than 50%. Electronically Signed   By: Aletta Edouard M.D.   On: 11/13/2019 09:21   Subjective: Patient was feeling better when seen this morning.  Ready to be discharged home.  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  Discharge Exam: Vitals:   11/16/19 0341 11/16/19 0819  BP: (!) 177/95 (!) 165/89  Pulse: (!) 54 (!) 58  Resp: 16 18  Temp: 98.8 F (37.1 C)   SpO2: 100% 100%   Vitals:   11/15/19 1936 11/15/19 2355 11/16/19 0341 11/16/19 0819  BP: 131/77 (!) 176/99 (!) 177/95 (!) 165/89  Pulse: (!) 56 (!) 55 (!) 54 (!) 58  Resp: 16 20 16 18   Temp: 97.8 F (36.6 C) (!) 97.5 F (36.4 C) 98.8 F (37.1 C)   TempSrc: Oral Oral    SpO2: 99% 100% 100% 100%  Weight:      Height:       General: Pt is alert, awake, not in acute distress Cardiovascular: RRR, S1/S2 +, no rubs, no  gallops Respiratory: CTA bilaterally, no wheezing, no rhonchi Abdominal: Soft, NT, ND, bowel sounds + Extremities: no edema, no cyanosis   The results of significant diagnostics from this hospitalization (including imaging, microbiology, ancillary and laboratory) are listed below for reference.     Microbiology: Recent Results (from the past 240 hour(s))  Urine culture     Status: Abnormal   Collection Time: 11/12/19  3:32 PM   Specimen: Urine, Random  Result Value Ref Range Status   Specimen Description   Final    URINE, RANDOM Performed at [facility], [address], [place], [place] [address]    Special Requests   Final    NONE Performed at [facility], [address] [place], [place] [address]    Culture (A)  Final    >=100,000 COLONIES/mL ESCHERICHIA COLI >=100,000 COLONIES/mL KLEBSIELLA PNEUMONIAE    Report Status 11/16/2019 FINAL  Final   Organism ID, Bacteria ESCHERICHIA COLI (A)  Final  Organism ID, Bacteria KLEBSIELLA PNEUMONIAE (A)  Final      Susceptibility   Escherichia coli - MIC*    AMPICILLIN <=2 SENSITIVE Sensitive     CEFAZOLIN <=4 SENSITIVE Sensitive     CEFTRIAXONE <=1 SENSITIVE Sensitive     CIPROFLOXACIN <=0.25 SENSITIVE Sensitive     GENTAMICIN <=1 SENSITIVE Sensitive     IMIPENEM <=0.25 SENSITIVE Sensitive     NITROFURANTOIN <=16 SENSITIVE Sensitive     TRIMETH/SULFA <=20 SENSITIVE Sensitive     AMPICILLIN/SULBACTAM <=2 SENSITIVE Sensitive     PIP/TAZO <=4 SENSITIVE Sensitive     Extended ESBL NEGATIVE Sensitive     * >=100,000 COLONIES/mL ESCHERICHIA COLI   Klebsiella pneumoniae - MIC*    AMPICILLIN >=32 RESISTANT Resistant     CEFAZOLIN <=4 SENSITIVE Sensitive     CEFTRIAXONE <=1 SENSITIVE Sensitive     CIPROFLOXACIN <=0.25 SENSITIVE Sensitive     GENTAMICIN <=1 SENSITIVE Sensitive     IMIPENEM <=0.25 SENSITIVE Sensitive     NITROFURANTOIN 128 RESISTANT Resistant     TRIMETH/SULFA <=20 SENSITIVE Sensitive      AMPICILLIN/SULBACTAM 16 INTERMEDIATE Intermediate     PIP/TAZO 16 SENSITIVE Sensitive     Extended ESBL NEGATIVE Sensitive     * >=100,000 COLONIES/mL KLEBSIELLA PNEUMONIAE  SARS CORONAVIRUS 2 (TAT 6-24 HRS) Nasopharyngeal Nasopharyngeal Swab     Status: None   Collection Time: 11/12/19  5:57 PM   Specimen: Nasopharyngeal Swab  Result Value Ref Range Status   SARS Coronavirus 2 NEGATIVE NEGATIVE Final    Comment: (NOTE) SARS-CoV-2 target nucleic acids are NOT DETECTED. The SARS-CoV-2 RNA is generally detectable in upper and lower respiratory specimens during the acute phase of infection. Negative results do not preclude SARS-CoV-2 infection, do not rule out co-infections with other pathogens, and should not be used as the sole basis for treatment or other patient management decisions. Negative results must be combined with clinical observations, patient history, and epidemiological information. The expected result is Negative. Fact Sheet for Patients: SugarRoll.be Fact Sheet for Healthcare Providers: https://www.woods-mathews.com/ This test is not yet approved or cleared by the [place] FDA and  has been authorized for detection and/or diagnosis of SARS-CoV-2 by FDA under an Emergency Use Authorization (EUA). This EUA will remain  in effect (meaning this test can be used) for the duration of the COVID-19 declaration under Section 56 4(b)(1) of the Act, 21 U.S.C. section 360bbb-3(b)(1), unless the authorization is terminated or revoked sooner. Performed at [facility], [place] [address], [place], [place] [address]      Labs: BNP (last 3 results) No results for input(s): BNP in the last 8760 hours. Basic Metabolic Panel: Recent Labs  Lab 11/12/19 1531 11/13/19 1215 11/16/19 0449  NA 137 138 138  K 4.9 4.7 4.6  CL 105 110 108  CO2 22 19* 20*  GLUCOSE 262* 168* 147*  BUN 50* 44* 33*  CREATININE 2.27* 2.10* 1.66*  CALCIUM  8.6* 8.4* 8.6*   Liver Function Tests: Recent Labs  Lab 11/12/19 1531  AST 25  ALT 15  ALKPHOS 83  BILITOT 0.7  PROT 7.1  ALBUMIN 3.0*   No results for input(s): LIPASE, AMYLASE in the last 168 hours. No results for input(s): AMMONIA in the last 168 hours. CBC: Recent Labs  Lab 11/12/19 1531 11/13/19 1215 11/15/19 1453 11/16/19 0449  WBC 9.6 10.4 9.3 10.9*  NEUTROABS 6.2  --   --   --   HGB 15.4 14.6 15.1 15.7  HCT 45.2 42.6 44.8 45.5  MCV 94.2 93.4 95.7 92.5  PLT 177 181 167 179   Cardiac Enzymes: Recent Labs  Lab 11/12/19 1531  CKTOTAL 72   BNP: Invalid input(s): POCBNP CBG: No results for input(s): GLUCAP in the last 168 hours. D-Dimer No results for input(s): DDIMER in the last 72 hours. Hgb A1c No results for input(s): HGBA1C in the last 72 hours. Lipid Profile No results for input(s): CHOL, HDL, LDLCALC, TRIG, CHOLHDL, LDLDIRECT in the last 72 hours. Thyroid function studies No results for input(s): TSH, T4TOTAL, T3FREE, THYROIDAB in the last 72 hours.  Invalid input(s): FREET3 Anemia work up No results for input(s): VITAMINB12, FOLATE, FERRITIN, TIBC, IRON, RETICCTPCT in the last 72 hours. Urinalysis    Component Value Date/Time   COLORURINE AMBER (A) 11/12/2019 1532   APPEARANCEUR CLOUDY (A) 11/12/2019 1532   APPEARANCEUR Cloudy (A) 06/01/2019 1533   LABSPEC 1.015 11/12/2019 1532   PHURINE 7.0 11/12/2019 1532   GLUCOSEU NEGATIVE 11/12/2019 1532   HGBUR NEGATIVE 11/12/2019 1532   BILIRUBINUR NEGATIVE 11/12/2019 1532   BILIRUBINUR Negative 06/01/2019 1533   KETONESUR NEGATIVE 11/12/2019 1532   PROTEINUR 100 (A) 11/12/2019 1532   NITRITE NEGATIVE 11/12/2019 1532   LEUKOCYTESUR MODERATE (A) 11/12/2019 1532   Sepsis Labs Invalid input(s): PROCALCITONIN,  WBC,  LACTICIDVEN Microbiology Recent Results (from the past 240 hour(s))  Urine culture     Status: Abnormal   Collection Time: 11/12/19  3:32 PM   Specimen: Urine, Random  Result Value  Ref Range Status   Specimen Description   Final    URINE, RANDOM Performed at [facility], [address], [place], [place] [address]    Special Requests   Final    NONE Performed at [facility], [address], [place], [place] [address]    Culture (A)  Final    >=100,000 COLONIES/mL ESCHERICHIA COLI >=100,000 COLONIES/mL KLEBSIELLA PNEUMONIAE    Report Status 11/16/2019 FINAL  Final   Organism ID, Bacteria ESCHERICHIA COLI (A)  Final   Organism ID, Bacteria KLEBSIELLA PNEUMONIAE (A)  Final      Susceptibility   Escherichia coli - MIC*    AMPICILLIN <=2 SENSITIVE Sensitive     CEFAZOLIN <=4 SENSITIVE Sensitive     CEFTRIAXONE <=1 SENSITIVE Sensitive     CIPROFLOXACIN <=0.25 SENSITIVE Sensitive     GENTAMICIN <=1 SENSITIVE Sensitive     IMIPENEM <=0.25 SENSITIVE Sensitive     NITROFURANTOIN <=16 SENSITIVE Sensitive     TRIMETH/SULFA <=20 SENSITIVE Sensitive     AMPICILLIN/SULBACTAM <=2 SENSITIVE Sensitive     PIP/TAZO <=4 SENSITIVE Sensitive     Extended ESBL NEGATIVE Sensitive     * >=100,000 COLONIES/mL ESCHERICHIA COLI   Klebsiella pneumoniae - MIC*    AMPICILLIN >=32 RESISTANT Resistant     CEFAZOLIN <=4 SENSITIVE Sensitive     CEFTRIAXONE <=1 SENSITIVE Sensitive     CIPROFLOXACIN <=0.25 SENSITIVE Sensitive     GENTAMICIN <=1 SENSITIVE Sensitive     IMIPENEM <=0.25 SENSITIVE Sensitive     NITROFURANTOIN 128 RESISTANT Resistant     TRIMETH/SULFA <=20 SENSITIVE Sensitive     AMPICILLIN/SULBACTAM 16 INTERMEDIATE Intermediate     PIP/TAZO 16 SENSITIVE Sensitive     Extended ESBL NEGATIVE Sensitive     * >=100,000 COLONIES/mL KLEBSIELLA PNEUMONIAE  SARS CORONAVIRUS 2 (TAT 6-24 HRS) Nasopharyngeal Nasopharyngeal Swab     Status: None   Collection Time: 11/12/19  5:57 PM   Specimen: Nasopharyngeal Swab  Result Value  Ref Range Status   SARS Coronavirus 2 NEGATIVE NEGATIVE Final    Comment: (NOTE) SARS-CoV-2 target nucleic acids are NOT DETECTED. The  SARS-CoV-2 RNA is generally detectable in upper and lower respiratory specimens during the acute phase of infection. Negative results do not preclude SARS-CoV-2 infection, do not rule out co-infections with other pathogens, and should not be used as the sole basis for treatment or other patient management decisions. Negative results must be combined with clinical observations, patient history, and epidemiological information. The expected result is Negative. Fact Sheet for Patients: SugarRoll.be Fact Sheet for Healthcare Providers: https://www.woods-mathews.com/ This test is not yet approved or cleared by the [place] FDA and  has been authorized for detection and/or diagnosis of SARS-CoV-2 by FDA under an Emergency Use Authorization (EUA). This EUA will remain  in effect (meaning this test can be used) for the duration of the COVID-19 declaration under Section 56 4(b)(1) of the Act, 21 U.S.C. section 360bbb-3(b)(1), unless the authorization is terminated or revoked sooner. Performed at [facility], [place] [address] [place], [place] [address]     Time coordinating discharge: Over 30 minutes  SIGNED:  Lorella Nimrod, MD  [facility] 11/16/2019, 12:56 PM Pager [phone]  If 7PM-7AM, please contact night-coverage [email or web address] Password [email or web address]  This record has been created using Systems analyst. Errors have been sought and corrected,but may not always be located. Such creation errors do not reflect on the standard of care.

## 2019-11-15 NOTE — Plan of Care (Signed)

## 2019-11-15 NOTE — Progress Notes (Signed)
Pt still having periods of confusion. Reorients as needed.pt had pulled iv out this am.  And  tely moniter off,  md notified.  This pm  Iv site restarted d/t pts  Elevated b/p and b/p med given iv.  abx cont.  Pt not drinking much. ivfs cont.

## 2019-11-15 NOTE — Consult Note (Addendum)
[place] for warfarin Indication: atrial fibrillation and hx of PE.   No Known Allergies  Patient Measurements: Height: 6\' 2"  (188 cm) Weight: 197 lb 5 oz (89.5 kg) IBW/kg (Calculated) : 82.2  Vital Signs: Temp: 98.7 F (37.1 C) (11/30 0907) Temp Source: Oral (11/30 0907) BP: 187/101 (11/30 0907) Pulse Rate: 58 (11/30 0907)  Labs: Recent Labs    11/12/19 1743 11/13/19 1215 11/15/19 1453  HGB  --  14.6 15.1  HCT  --  42.6 44.8  PLT  --  181 167  LABPROT  --  25.0* 18.8*  INR  --  2.3* 1.6*  CREATININE  --  2.10*  --   TROPONINIHS 16  --   --     Estimated Creatinine Clearance: 29.4 mL/min (A) (by C-G formula based on SCr of 2.1 mg/dL (H)).   Medical History: Past Medical History:  Diagnosis Date  . Atrial fibrillation ([place])   . Cancer ([place])   . Chronic kidney disease   . Colon polyp   . Diabetes mellitus   . History of blood clotting disorder   . HTN (hypertension)     Medications:  Medications Prior to Admission  Medication Sig Dispense Refill Last Dose  . Alpha-Lipoic Acid 200 MG CAPS Take 1 capsule by mouth daily.    11/12/2019 at Unknown time  . Calcium Carbonate (CALCIUM 600 PO) Take 1 tablet by mouth daily.    11/12/2019 at 0800  . Cholecalciferol (VITAMIN D) 2000 UNITS tablet Take 2,000 Units by mouth daily.     11/12/2019 at 0800  . CIALIS 5 MG tablet TK 1/2 T PO QD  11 11/12/2019 at 0800  . febuxostat (ULORIC) 40 MG tablet Take 1 tablet by mouth daily.    11/12/2019 at 0800  . glimepiride (AMARYL) 4 MG tablet Take 4 mg by mouth daily with breakfast.   11 11/12/2019 at 0800  . lisinopril (PRINIVIL,ZESTRIL) 5 MG tablet Take 5 mg by mouth daily.     11/12/2019 at 0800  . metoprolol tartrate (LOPRESSOR) 25 MG tablet Take 0.5 tablets (12.5 mg total) by mouth 2 (two) times daily. 60 tablet 0 11/12/2019 at 0800  . Multiple Vitamins-Minerals (MULTIVITAMIN ADULT EXTRA C PO) Take 1 tablet by mouth daily.    11/12/2019 at  0800  . Red Yeast Rice 600 MG TABS Take 1 tablet by mouth daily.    11/12/2019 at 0800  . senna-docusate (SENOKOT-S) 8.6-50 MG tablet Take 1 tablet by mouth 2 (two) times daily.   11/12/2019 at 0800  . warfarin (COUMADIN) 5 MG tablet Take 5 mg by mouth daily.   11/12/2019 at 0800  . ALPRAZolam (XANAX) 0.25 MG tablet Take 0.25 mg by mouth at bedtime as needed.     Not Taking at Unknown time  . atorvastatin (LIPITOR) 10 MG tablet Take 10 mg by mouth daily.     Not Taking at Unknown time  . Coenzyme Q10 10 MG capsule Take 10 mg by mouth.     . colchicine 0.6 MG tablet Take 0.6 mg by mouth.   prn at prn  . lacosamide 100 MG TABS Take 1 tablet (100 mg total) by mouth 2 (two) times daily. (Patient not taking: Reported on 11/12/2019) 30 tablet 0 Not Taking at Unknown time  . nystatin-triamcinolone (MYCOLOG II) cream Apply to affected areas bid   prn at prn  . Vitamins A & D (VITAMIN A & D) [address] UNITS TABS Take 1 tablet  by mouth daily.       Scheduled:  . aspirin  81 mg Oral Daily  . atorvastatin  40 mg Oral Daily  . cholecalciferol  2,000 Units Oral Daily  . febuxostat  80 mg Oral Daily  . lacosamide  100 mg Oral BID  . lisinopril  10 mg Oral Daily  . metoprolol tartrate  12.5 mg Oral BID  . omega-3 acid ethyl esters  1,000 mg Oral Daily  . polyethylene glycol  17 g Oral Daily  . senna-docusate  1 tablet Oral BID  . Warfarin - Pharmacist Dosing Inpatient   Does not apply q1800   Infusions:  . sodium chloride Stopped (11/15/19 0702)  . cefTRIAXone (ROCEPHIN)  IV 1 g (11/14/19 1509)   PRN: acetaminophen **OR** acetaminophen (TYLENOL) oral liquid 160 mg/5 mL **OR** acetaminophen, hydrALAZINE  Assessment: Pharmacy consulted to restart warfarin for afib. CBC stable. DDI: on ceftriaxone, and asa 81 mg daily.   Home Dose: warfarin 5 mg daily.   Date INR Warfarin Dose  11/30 1.6 5 mg        Goal of Therapy:  INR 2-3 Monitor platelets by anticoagulation protocol: Yes   Plan:  INR  is subtherapeutic. Will order home dose of warfarin 5 mg x 1. Daily INR ordered. CBC q3 days. Pharmacy will continue to monitor and follow.   Oswald Hillock, PharmD, BCPS 11/15/2019,3:40 PM

## 2019-11-16 LAB — CBC
HCT: 45.5 % (ref 39.0–52.0)
Hemoglobin: 15.7 g/dL (ref 13.0–17.0)
MCH: 31.9 pg (ref 26.0–34.0)
MCHC: 34.5 g/dL (ref 30.0–36.0)
MCV: 92.5 fL (ref 80.0–100.0)
Platelets: 179 10*3/uL (ref 150–400)
RBC: 4.92 MIL/uL (ref 4.22–5.81)
RDW: 12.8 % (ref 11.5–15.5)
WBC: 10.9 10*3/uL — ABNORMAL HIGH (ref 4.0–10.5)
nRBC: 0 % (ref 0.0–0.2)

## 2019-11-16 LAB — BASIC METABOLIC PANEL
Anion gap: 10 (ref 5–15)
BUN: 33 mg/dL — ABNORMAL HIGH (ref 8–23)
CO2: 20 mmol/L — ABNORMAL LOW (ref 22–32)
Calcium: 8.6 mg/dL — ABNORMAL LOW (ref 8.9–10.3)
Chloride: 108 mmol/L (ref 98–111)
Creatinine, Ser: 1.66 mg/dL — ABNORMAL HIGH (ref 0.61–1.24)
GFR calc Af Amer: 43 mL/min — ABNORMAL LOW (ref >60)
GFR calc non Af Amer: 37 mL/min — ABNORMAL LOW (ref >60)
Glucose, Bld: 147 mg/dL — ABNORMAL HIGH (ref 70–99)
Potassium: 4.6 mmol/L (ref 3.5–5.1)
Sodium: 138 mmol/L (ref 135–145)

## 2019-11-16 LAB — URINE CULTURE: Culture: >=100000 — AB

## 2019-11-16 LAB — PROTIME-INR
INR: 1.6 — ABNORMAL HIGH (ref 0.8–1.2)
Prothrombin Time: 18.7 seconds — ABNORMAL HIGH (ref 11.4–15.2)

## 2019-11-16 MED ORDER — LISINOPRIL 10 MG PO TABS: 10.0000 mg | ORAL_TABLET | Freq: Every day | ORAL | 2 refills | Status: DC

## 2019-11-16 MED ORDER — WARFARIN SODIUM 7.5 MG PO TABS: 7.5000 mg | ORAL_TABLET | Freq: Once | ORAL | Status: DC

## 2019-11-16 MED ORDER — ATORVASTATIN CALCIUM 40 MG PO TABS: 40.0000 mg | ORAL_TABLET | Freq: Every day | ORAL | 2 refills | Status: DC

## 2019-11-16 MED ORDER — ASPIRIN 81 MG PO CHEW: 81.0000 mg | CHEWABLE_TABLET | Freq: Every day | ORAL | 0 refills | Status: DC

## 2019-11-16 MED ORDER — WARFARIN SODIUM 6 MG PO TABS
6.0000 mg | ORAL_TABLET | Freq: Once | ORAL | Status: DC
  Filled 2019-11-16: qty 1

## 2019-11-16 NOTE — Progress Notes (Signed)
Pt for discharge home. Alert. No resp distress. Discharge  Discussed with pts caregiver  Richard [name] over phone re meds / diet / activity and f/u.  Verbalize understanding.pt s son Richard [name]  On way to pick pt up.  And to bring clothes and pts walker.  ivfs and site d/cd.

## 2019-11-16 NOTE — Progress Notes (Signed)
Physical Therapy Treatment Patient Details Name: Richard [name] MRN: [id number] DOB: 1931-11-15 Today's Date: 11/16/2019    History of Present Illness Pt is an 85 year old male presenting with AMS, family found him in his garage, confused. MRI findings 11/12/19: Small focus of acute ischemia within the right precentral gyrus. PMH of of atrial fibrillation on Coumadin INR of 2.1 on this admission, diabetes, and hypertension.    PT Comments    Pt ready to walk.  To edge of bed with encouragement and rails with increase time. Stood with min  A x 1 to RW.  Pt was able to complete a full lap around the unit with RW and min guard/min a x 1.  Right lean increased with fatigue but has no LOB needing assist to correct.  He will need +1 assist at all times for mobility skills for safety.   Follow Up Recommendations  Supervision/Assistance - 24 hour;Home health PT;Supervision for mobility/OOB     Equipment Recommendations  Rolling walker with 5" wheels    Recommendations for Other Services       Precautions / Restrictions Precautions Precautions: Fall    Mobility  Bed Mobility Overal bed mobility: Needs Assistance Bed Mobility: Supine to Sit     Supine to sit: Modified independent (Device/Increase time)     General bed mobility comments: Increased time needed, use of handrails  Transfers Overall transfer level: Needs assistance Equipment used: Rolling walker (2 wheeled) Transfers: Sit to/from Stand Sit to Stand: Min guard         General transfer comment: minA needed for initiation, able to complete transfer and establish standing balance following  Ambulation/Gait Ambulation/Gait assistance: Min guard;Min assist Gait Distance (Feet): 160 Feet Assistive device: Rolling walker (2 wheeled) Gait Pattern/deviations: Step-through pattern;Decreased step length - right Gait velocity: decreased   General Gait Details: R lean which increases with fatigue   Stairs              Wheelchair Mobility    Modified Rankin (Stroke Patients Only)       Balance Overall balance assessment: Needs assistance Sitting-balance support: Feet supported Sitting balance-Leahy Scale: Good   Postural control: Right lateral lean Standing balance support: Bilateral upper extremity supported Standing balance-Leahy Scale: Fair Standing balance comment: R lean                            Cognition Arousal/Alertness: Awake/alert Behavior During Therapy: WFL for tasks assessed/performed Overall Cognitive Status: No family/caregiver present to determine baseline cognitive functioning                                        Exercises      General Comments        Pertinent Vitals/Pain Pain Assessment: No/denies pain    Home Living                      Prior Function            PT Goals (current goals can now be found in the care plan section) Progress towards PT goals: Progressing toward goals    Frequency    Min 2X/week      PT Plan Current plan remains appropriate    Co-evaluation              AM-PAC PT "6 Clicks"  Mobility   Outcome Measure  Help needed turning from your back to your side while in a flat bed without using bedrails?: A Little Help needed moving from lying on your back to sitting on the side of a flat bed without using bedrails?: A Little Help needed moving to and from a bed to a chair (including a wheelchair)?: A Little Help needed standing up from a chair using your arms (e.g., wheelchair or bedside chair)?: A Little Help needed to walk in hospital room?: A Little Help needed climbing 3-5 steps with a railing? : A Little 6 Click Score: 18    End of Session Equipment Utilized During Treatment: Gait belt Activity Tolerance: Patient tolerated treatment well Patient left: in chair;with call bell/phone within reach;with chair alarm set Nurse Communication: Mobility status       Time:  2924-4628 PT Time Calculation (min) (ACUTE ONLY): 14 min  Charges:  $Gait Training: 8-22 mins                     Chesley Noon, PTA 11/16/19, 9:38 AM

## 2019-11-16 NOTE — Consult Note (Addendum)
[facility] for warfarin Indication: atrial fibrillation and hx of PE.   No Known Allergies  Patient Measurements: Height: 6\' 2"  (188 cm) Weight: 197 lb 5 oz (89.5 kg) IBW/kg (Calculated) : 82.2  Vital Signs: Temp: 98.8 F (37.1 C) (12/01 0341) Temp Source: Oral (11/30 2355) BP: 177/95 (12/01 0341) Pulse Rate: 54 (12/01 0341)  Labs: Recent Labs    11/13/19 1215 11/15/19 1453 11/16/19 0449  HGB 14.6 15.1 15.7  HCT 42.6 44.8 45.5  PLT 181 167 179  LABPROT 25.0* 18.8* 18.7*  INR 2.3* 1.6* 1.6*  CREATININE 2.10*  --  1.66*    Estimated Creatinine Clearance: 37.1 mL/min (A) (by C-G formula based on SCr of 1.66 mg/dL (H)).   Medical History: Past Medical History:  Diagnosis Date  . Atrial fibrillation ([place])   . Cancer ([place])   . Chronic kidney disease   . Colon polyp   . Diabetes mellitus   . History of blood clotting disorder   . HTN (hypertension)     Medications:  Medications Prior to Admission  Medication Sig Dispense Refill Last Dose  . Alpha-Lipoic Acid 200 MG CAPS Take 1 capsule by mouth daily.    11/12/2019 at Unknown time  . Calcium Carbonate (CALCIUM 600 PO) Take 1 tablet by mouth daily.    11/12/2019 at 0800  . Cholecalciferol (VITAMIN D) 2000 UNITS tablet Take 2,000 Units by mouth daily.     11/12/2019 at 0800  . CIALIS 5 MG tablet TK 1/2 T PO QD  11 11/12/2019 at 0800  . febuxostat (ULORIC) 40 MG tablet Take 1 tablet by mouth daily.    11/12/2019 at 0800  . glimepiride (AMARYL) 4 MG tablet Take 4 mg by mouth daily with breakfast.   11 11/12/2019 at 0800  . lisinopril (PRINIVIL,ZESTRIL) 5 MG tablet Take 5 mg by mouth daily.     11/12/2019 at 0800  . metoprolol tartrate (LOPRESSOR) 25 MG tablet Take 0.5 tablets (12.5 mg total) by mouth 2 (two) times daily. 60 tablet 0 11/12/2019 at 0800  . Multiple Vitamins-Minerals (MULTIVITAMIN ADULT EXTRA C PO) Take 1 tablet by mouth daily.    11/12/2019 at 0800  . Red Yeast Rice  600 MG TABS Take 1 tablet by mouth daily.    11/12/2019 at 0800  . senna-docusate (SENOKOT-S) 8.6-50 MG tablet Take 1 tablet by mouth 2 (two) times daily.   11/12/2019 at 0800  . warfarin (COUMADIN) 5 MG tablet Take 5 mg by mouth daily.   11/12/2019 at 0800  . ALPRAZolam (XANAX) 0.25 MG tablet Take 0.25 mg by mouth at bedtime as needed.     Not Taking at Unknown time  . atorvastatin (LIPITOR) 10 MG tablet Take 10 mg by mouth daily.     Not Taking at Unknown time  . Coenzyme Q10 10 MG capsule Take 10 mg by mouth.     . colchicine 0.6 MG tablet Take 0.6 mg by mouth.   prn at prn  . lacosamide 100 MG TABS Take 1 tablet (100 mg total) by mouth 2 (two) times daily. (Patient not taking: Reported on 11/12/2019) 30 tablet 0 Not Taking at Unknown time  . nystatin-triamcinolone (MYCOLOG II) cream Apply to affected areas bid   prn at prn  . Vitamins A & D (VITAMIN A & D) [address] UNITS TABS Take 1 tablet by mouth daily.       Scheduled:  . aspirin  81 mg Oral Daily  . atorvastatin  40 mg Oral Daily  . cholecalciferol  2,000 Units Oral Daily  . febuxostat  80 mg Oral Daily  . lacosamide  100 mg Oral BID  . lisinopril  10 mg Oral Daily  . metoprolol tartrate  12.5 mg Oral BID  . omega-3 acid ethyl esters  1,000 mg Oral Daily  . polyethylene glycol  17 g Oral Daily  . senna-docusate  1 tablet Oral BID  . Warfarin - Pharmacist Dosing Inpatient   Does not apply q1800   Infusions:  . sodium chloride 75 mL/hr at 11/16/19 0300  . cefTRIAXone (ROCEPHIN)  IV Stopped (11/15/19 1824)   PRN: acetaminophen **OR** acetaminophen (TYLENOL) oral liquid 160 mg/5 mL **OR** acetaminophen, hydrALAZINE  Assessment: Pharmacy consulted to restart warfarin for afib. CBC stable. DDI: on ceftriaxone, and asa 81 mg daily.   Home Dose: warfarin 5 mg daily. Last dose on 11/12/2019  Today's INR: Subtherapeutic as expected given warfarin was restarted yesterday evening.   Date INR Warfarin Dose  11/27 2.1  home dose  warfarin 5 mg   11/28 2.3 -----  11/29 ----- -----  11/30 1.6 5 mg   12/1 1.5          Goal of Therapy:  INR 2-3 Monitor platelets by anticoagulation protocol: Yes   Plan:  Will order warfarin 6 mg x 1. Daily INR ordered. CBC q3 days. Pharmacy will continue to monitor and follow.   Rowland Lathe, PharmD 11/16/2019,7:13 AM

## 2019-11-16 NOTE — Discharge Instructions (Signed)
Antibiotic Medicine, Adult  Antibiotic medicines treat infections caused by a type of germ called bacteria. They work by killing the bacteria that make you sick. When do I need to take antibiotics? You often need these medicines to treat bacterial infections, such as:  A urinary tract infection (UTI).  Strep throat.  Meningitis. This affects the spinal cord and brain.  A bad lung infection. You may start the medicines while your doctor waits for tests to come back. When the tests come back, your doctor may change or stop your medicine. When are antibiotics not needed? You do not need these medicines for most common illnesses, such as:  A cold.  The flu.  A sore throat. Antibiotics are not always needed for all infections caused by bacteria. Do not ask for these medicines, or take them, when they are not needed. What are the risks of taking antibiotics? Most antibiotics can cause an infection called Clostridioides difficile (C. diff). This causes watery poop (diarrhea). Let your doctor know right away if:  You have watery poop while taking an antibiotic.  You have watery poop after you stop taking an antibiotic. The illness can happen weeks after you stop the medicine. You also have a risk of getting an infection in the future that antibiotics cannot treat (antibiotic-resistant infection). This type of infection can be dangerous. What else should I know about taking antibiotics?   You need to take the entire prescription. ? Take the medicine for as long as told by your doctor. ? Do not stop taking it even if you start to feel better.  Try not to miss any doses. If you miss a dose, call your doctor.  Birth control pills may not work. If you take birth control pills: ? Keep on taking them. ? Use a second form of birth control, such as a condom. Do this for as long as told by your doctor.  Ask your doctor: ? How long to wait in between doses. ? If you should take the medicine  with food. ? If there is anything you should stay away from while taking the antibiotic, such as: ? Food. ? Drinks. ? Medicines. ? If there are any side effects you should watch for.  Only take the medicines that your doctor told you to take. Do not take medicines that were given to someone else.  Drink a large glass of water with the medicine.  Ask the pharmacist for a tool to measure the medicine, such as: ? A syringe. ? A cup. ? A spoon.  Throw away any extra medicine. Contact a doctor if:  You get worse.  You have new joint pain or muscle aches after starting the medicine.  You have side effects from the medicine, such as: ? Stomach pain. ? Watery poop. ? Feeling sick to your stomach (nausea). Get help right away if:  You have signs of a very bad allergic reaction. If this happens, stop taking the medicine right away. Signs may include: ? Hives. These are raised, itchy, red bumps on the skin. ? Skin rash. ? Trouble breathing. ? Wheezing. ? Swelling. ? Feeling dizzy. ? Throwing up (vomiting).  Your pee (urine) is dark, or is the color of blood.  Your skin turns yellow.  You bruise easily.  You bleed easily.  You have very bad watery poop and cramps in your belly.  You have a very bad headache. Summary  Antibiotics are often used to treat infections caused by bacteria.  Only  take these medicines when needed.  Let your doctor know if you have watery poop while taking an antibiotic.  You need to take the entire prescription. This information is not intended to replace advice given to you by your health care provider. Make sure you discuss any questions you have with your health care provider. Document Released: 09/10/2008 Document Revised: 01/08/2019 Document Reviewed: 12/04/2016 Elsevier Patient Education  2020 [employer].

## 2019-11-16 NOTE — TOC Transition Note (Signed)
Transition of Care [facility]) - CM/SW Discharge Note   Patient Details  Name: Richard [name] MRN: [id number] Date of Birth: 02-Apr-1931  [facility] CM/SW Contact:  Su Hilt, RN Phone Number: 11/16/2019, 1:44 PM   Clinical Narrative:   Patient going home with [facility] services thru [facility], NO DME needed, no additional needs    Final next level of care: Home w Home Health Services Barriers to Discharge: Barriers Resolved   Patient Goals and CMS Choice Patient states their goals for this hospitalization and ongoing recovery are:: go home CMS Medicare.gov Compare Post Acute Care list provided to:: Patient Choice offered to / list presented to : Patient  Discharge Placement                       Discharge Plan and Services   Discharge Planning Services: CM Consult Post Acute Care Choice: Home Health          DME Arranged: N/A         HH Arranged: PT [place] Agency: [facility] ([facility]) Date HH Agency Contacted: 11/15/19 Time [place]: [id number] Representative spoke with at [place]: [place] ([place]) Interventions     Readmission Risk Interventions No flowsheet data found.

## 2019-11-16 NOTE — Care Management Important Message (Signed)
Important Message  Patient Details  Name: Richard [name] MRN: [id number] Date of Birth: [birth year]   Medicare Important Message Given:  Yes     Juliann Pulse A Brette Cast 11/16/2019, 11:06 AM

## 2019-12-27 ENCOUNTER — Emergency Department: Payer: Medicare Other

## 2019-12-27 ENCOUNTER — Emergency Department
Admission: EM | Admit: 2019-12-27 | Discharge: 2019-12-28 | Disposition: A | Discharge status: Home / Self Care | Payer: Medicare Other | Attending: Emergency Medicine | Admitting: Emergency Medicine

## 2019-12-27 ENCOUNTER — Other Ambulatory Visit: Payer: Self-pay

## 2019-12-27 DIAGNOSIS — N3000 Acute cystitis without hematuria: Secondary | ICD-10-CM | POA: Insufficient documentation

## 2019-12-27 DIAGNOSIS — I129 Hypertensive chronic kidney disease with stage 1 through stage 4 chronic kidney disease, or unspecified chronic kidney disease: Secondary | ICD-10-CM | POA: Insufficient documentation

## 2019-12-27 DIAGNOSIS — Z8546 Personal history of malignant neoplasm of prostate: Secondary | ICD-10-CM | POA: Diagnosis not present

## 2019-12-27 DIAGNOSIS — K5641 Fecal impaction: Secondary | ICD-10-CM | POA: Diagnosis not present

## 2019-12-27 DIAGNOSIS — Z7984 Long term (current) use of oral hypoglycemic drugs: Secondary | ICD-10-CM | POA: Insufficient documentation

## 2019-12-27 DIAGNOSIS — E1122 Type 2 diabetes mellitus with diabetic chronic kidney disease: Secondary | ICD-10-CM | POA: Diagnosis not present

## 2019-12-27 DIAGNOSIS — Z85828 Personal history of other malignant neoplasm of skin: Secondary | ICD-10-CM | POA: Diagnosis not present

## 2019-12-27 DIAGNOSIS — N183 Chronic kidney disease, stage 3 unspecified: Secondary | ICD-10-CM | POA: Insufficient documentation

## 2019-12-27 DIAGNOSIS — K5900 Constipation, unspecified: Secondary | ICD-10-CM

## 2019-12-27 DIAGNOSIS — R0902 Hypoxemia: Secondary | ICD-10-CM | POA: Diagnosis not present

## 2019-12-27 DIAGNOSIS — Z89421 Acquired absence of other right toe(s): Secondary | ICD-10-CM | POA: Diagnosis not present

## 2019-12-27 DIAGNOSIS — I10 Essential (primary) hypertension: Secondary | ICD-10-CM | POA: Diagnosis not present

## 2019-12-27 DIAGNOSIS — Z7901 Long term (current) use of anticoagulants: Secondary | ICD-10-CM | POA: Insufficient documentation

## 2019-12-27 DIAGNOSIS — R1084 Generalized abdominal pain: Secondary | ICD-10-CM | POA: Diagnosis not present

## 2019-12-27 DIAGNOSIS — Z7982 Long term (current) use of aspirin: Secondary | ICD-10-CM | POA: Insufficient documentation

## 2019-12-27 DIAGNOSIS — N189 Chronic kidney disease, unspecified: Secondary | ICD-10-CM | POA: Diagnosis not present

## 2019-12-27 DIAGNOSIS — Z79899 Other long term (current) drug therapy: Secondary | ICD-10-CM | POA: Diagnosis not present

## 2019-12-27 DIAGNOSIS — R1030 Lower abdominal pain, unspecified: Secondary | ICD-10-CM | POA: Diagnosis not present

## 2019-12-27 LAB — CBC
HCT: 45.9 % (ref 39.0–52.0)
Hemoglobin: 15.4 g/dL (ref 13.0–17.0)
MCH: 32.4 pg (ref 26.0–34.0)
MCHC: 33.6 g/dL (ref 30.0–36.0)
MCV: 96.4 fL (ref 80.0–100.0)
Platelets: 261 10*3/uL (ref 150–400)
RBC: 4.76 MIL/uL (ref 4.22–5.81)
RDW: 12.5 % (ref 11.5–15.5)
WBC: 14.5 10*3/uL — ABNORMAL HIGH (ref 4.0–10.5)
nRBC: 0 % (ref 0.0–0.2)

## 2019-12-27 LAB — COMPREHENSIVE METABOLIC PANEL
ALT: 15 U/L (ref 0–44)
AST: 23 U/L (ref 15–41)
Albumin: 2.6 g/dL — ABNORMAL LOW (ref 3.5–5.0)
Alkaline Phosphatase: 88 U/L (ref 38–126)
Anion gap: 10 (ref 5–15)
BUN: 51 mg/dL — ABNORMAL HIGH (ref 8–23)
CO2: 22 mmol/L (ref 22–32)
Calcium: 8.5 mg/dL — ABNORMAL LOW (ref 8.9–10.3)
Chloride: 104 mmol/L (ref 98–111)
Creatinine, Ser: 2.23 mg/dL — ABNORMAL HIGH (ref 0.61–1.24)
GFR calc Af Amer: 30 mL/min — ABNORMAL LOW (ref >60)
GFR calc non Af Amer: 26 mL/min — ABNORMAL LOW (ref >60)
Glucose, Bld: 160 mg/dL — ABNORMAL HIGH (ref 70–99)
Potassium: 4.1 mmol/L (ref 3.5–5.1)
Sodium: 136 mmol/L (ref 135–145)
Total Bilirubin: 0.6 mg/dL (ref 0.3–1.2)
Total Protein: 6.7 g/dL (ref 6.5–8.1)

## 2019-12-27 LAB — URINALYSIS, COMPLETE (UACMP) WITH MICROSCOPIC
Bilirubin Urine: NEGATIVE
Glucose, UA: 50 mg/dL — AB
Ketones, ur: NEGATIVE mg/dL
Nitrite: NEGATIVE
Protein, ur: >=300 mg/dL — AB
Specific Gravity, Urine: 1.014 (ref 1.005–1.030)
WBC, UA: >50 WBC/hpf — ABNORMAL HIGH (ref 0–5)
pH: 6 (ref 5.0–8.0)

## 2019-12-27 LAB — LIPASE, BLOOD: Lipase: 52 U/L — ABNORMAL HIGH (ref 11–51)

## 2019-12-27 MED ORDER — IOHEXOL 9 MG/ML PO SOLN: 500.0000 mL | Freq: Two times a day (BID) | ORAL | Status: DC | PRN

## 2019-12-27 NOTE — ED Provider Notes (Signed)
[facility] Emergency Department Provider Note  Time seen: 7:40 PM  I have reviewed the triage vital signs and the nursing notes.   HISTORY  Chief Complaint Abdominal Pain and Constipation   HPI Richard [name] is a 86 y.o. male with a past medical history of atrial fibrillation CKD, diabetes, hypertension, presents to the emergency department for abdominal pain.  According to the patient has been approximately for 5 days since his last bowel movement, states over the past 24 hours or so he has been developing pain most across the mid to lower abdomen.  Patient uses self in and out catheterizations denies any difficulty with that or decreased urination.  Denies any vomiting fever cough congestion or shortness of breath.  Overall the patient appears well, no acute distress.  Describes the abdominal pain is mild dull pain but moderate if he pushes on the lower abdomen.  Past Medical History:  Diagnosis Date  . Atrial fibrillation ([place])   . Cancer ([place])   . Chronic kidney disease   . Colon polyp   . Diabetes mellitus   . History of blood clotting disorder   . HTN (hypertension)     Patient Active Problem List   Diagnosis Date Noted  . Stroke (cerebrum) ([place]) 11/13/2019  . Acute ischemic stroke ([place])   . Urinary tract infection without hematuria   . AKI (acute kidney injury) ([place])   . Altered mental status 11/12/2019  . Subdural hematoma ([place]) 12/28/2018  . PAF (paroxysmal atrial fibrillation) ([place]) 12/28/2018  . Diabetes ([place]) 12/28/2018  . CKD (chronic kidney disease), stage III 12/28/2018  . HTN (hypertension) 12/28/2018  . Pressure injury of skin 11/30/2018  . Acute osteomyelitis of toe of right foot ([place]) 11/28/2018  . Malignant tumor of prostate ([place]) 10/26/2015  . Urinary tract infection with hematuria 01/11/2015  . Bladder neck obstruction 11/04/2012  . 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 Prostate cancer ([place]) 11/04/2012  . Retention of urine  05/06/2012  . History of hematuria 01/30/2012  . Malignant neoplasm of prostate ([place]) 09/30/2011  . Gross hematuria 09/19/2011  . Benign prostatic hyperplasia with urinary retention 09/10/2011  . Elevated prostate specific antigen (PSA) 09/10/2011    Past Surgical History:  Procedure Laterality Date  . AMPUTATION TOE Right 11/29/2018   Procedure: AMPUTATION TOE;  Surgeon: Albertine Patricia, DPM;  Location: [facility] ORS;  Service: Podiatry;  Laterality: Right;  . EYE SURGERY     CATARACTS  . HERNIA REPAIR    . LOWER EXTREMITY ANGIOGRAPHY Right 12/02/2018   Procedure: Lower Extremity Angiography;  Surgeon: Algernon Huxley, MD;  Location: [facility];  Service: Cardiovascular;  Laterality: Right;  . SKIN CANCER EXCISION      Prior to Admission medications   Medication Sig Start Date End Date Taking? Authorizing Provider  Alpha-Lipoic Acid 200 MG CAPS Take 1 capsule by mouth daily.     [provider]  ALPRAZolam Duanne Moron) 0.25 MG tablet Take 0.25 mg by mouth at bedtime as needed.      [provider]  aspirin 81 MG chewable tablet Chew 1 tablet (81 mg total) by mouth daily. 11/17/19   Lorella Nimrod, MD  atorvastatin (LIPITOR) 40 MG tablet Take 1 tablet (40 mg total) by mouth daily. 11/17/19   Lorella Nimrod, MD  Calcium Carbonate (CALCIUM 600 PO) Take 1 tablet by mouth daily.     [provider]  Cholecalciferol (VITAMIN D) 2000 UNITS tablet Take 2,000 Units by mouth daily.  [provider]  CIALIS 5 MG tablet TK 1/2 T PO QD 06/16/17   [provider]  Coenzyme Q10 10 MG capsule Take 10 mg by mouth.    [provider]  colchicine 0.6 MG tablet Take 0.6 mg by mouth.    [provider]  febuxostat (ULORIC) 40 MG tablet Take 1 tablet by mouth daily.     [provider]  glimepiride (AMARYL) 4 MG tablet Take 4 mg by mouth daily with breakfast.  06/15/17   [provider]  lisinopril (ZESTRIL) 10 MG tablet Take 1  tablet (10 mg total) by mouth daily. 11/17/19   Lorella Nimrod, MD  metoprolol tartrate (LOPRESSOR) 25 MG tablet Take 0.5 tablets (12.5 mg total) by mouth 2 (two) times daily. 12/30/18   Demetrios Loll, MD  Multiple Vitamins-Minerals (MULTIVITAMIN ADULT EXTRA C PO) Take 1 tablet by mouth daily.     [provider]  nystatin-triamcinolone (MYCOLOG II) cream Apply to affected areas bid 05/08/16   [provider]  Red Yeast Rice 600 MG TABS Take 1 tablet by mouth daily.     [provider]  senna-docusate (SENOKOT-S) 8.6-50 MG tablet Take 1 tablet by mouth 2 (two) times daily. 01/04/19   Gladstone Lighter, MD  Vitamins A & D (VITAMIN A & D) [address] UNITS TABS Take 1 tablet by mouth daily.     [provider]  warfarin (COUMADIN) 5 MG tablet Take 5 mg by mouth daily. 09/22/19   [provider]    No Known Allergies  No family history on file.  Social History Social History   Tobacco Use  . Smoking status: Never Smoker  . Smokeless tobacco: Never Used  Substance Use Topics  . Alcohol use: No  . Drug use: Never    Review of Systems Constitutional: Negative for fever. Cardiovascular: Negative for chest pain. Respiratory: Negative for shortness of breath. Gastrointestinal: Abdominal pain.  Constipation. Genitourinary: Negative for urinary compaints Musculoskeletal: Negative for musculoskeletal complaints Neurological: Negative for headache All other ROS negative  ____________________________________________   PHYSICAL EXAM:  VITAL SIGNS: ED Triage Vitals  Enc Vitals Group     BP 12/27/19 1920 129/86     Pulse Rate 12/27/19 1920 66     Resp 12/27/19 1920 18     Temp 12/27/19 1920 97.8 F (36.6 C)     Temp Source 12/27/19 1920 Oral     SpO2 12/27/19 1920 98 %     Weight 12/27/19 1921 220 lb (99.8 kg)     Height 12/27/19 1921 6\' 2"  (1.88 m)     Head Circumference --      Peak Flow --      Pain Score 12/27/19 1921 0     Pain Loc --       Pain Edu? --      Excl. in [place]? --    Constitutional: Alert and oriented. Well appearing and in no distress. Eyes: Normal exam ENT      Head: Normocephalic and atraumatic.      Mouth/Throat: Mucous membranes are moist. Cardiovascular: Normal rate, regular rhythm. Respiratory: Normal respiratory effort without tachypnea nor retractions. Breath sounds are clear Gastrointestinal: Soft, mild diffuse abdominal tenderness moderate periumbilical to lower abdominal tenderness.  No rebound guarding or distention. Musculoskeletal: Nontender with normal range of motion in all extremities. No lower extremity tenderness or edema. Neurologic:  Normal speech and language. No gross focal neurologic deficits Skin:  Skin is warm, dry and intact.  Psychiatric: Mood and affect are normal.   ____________________________________________    RADIOLOGY  CT scan shows stool impaction with possible mild proctitis as well as urinary retention 1.7 L of urine.  ____________________________________________   INITIAL IMPRESSION / ASSESSMENT AND PLAN / ED COURSE  Pertinent labs & imaging results that were available during my care of the patient were reviewed by me and considered in my medical decision making (see chart for details).   Patient presents emergency department for abdominal pain had 4 days of constipation.  Differential would include constipation, colitis or diverticulitis, ileus or SBO, intra-abdominal infection or other pathology, UTI.  We will check labs, urinalysis obtain CT imaging of the abdomen and pelvis.  Patient agreeable to plan of care.  CT shows fecal impaction with urinary retention.  I disimpacted the patient with a significant amount of hard stool removed.  We will do a soapsuds enema.  Patient uses in and out catheters at home, we In-N-Out cath the patient in the emergency department approximately 1.8 L of urine removed.  Patient states he is feeling much better.  After enema I would  anticipate discharge home with PCP follow-up and the patient is agreeable to this plan of care.  KAIYDEN SIMKIN was evaluated in Emergency Department on 12/27/2019 for the symptoms described in the history of present illness. He was evaluated in the context of the global COVID-19 pandemic, which necessitated consideration that the patient might be at risk for infection with the SARS-CoV-2 virus that causes COVID-19. Institutional protocols and algorithms that pertain to the evaluation of patients at risk for COVID-19 are in a state of rapid change based on information released by regulatory bodies including the CDC and federal and state organizations. These policies and algorithms were followed during the patient's care in the ED.  ____________________________________________   FINAL CLINICAL IMPRESSION(S) / ED DIAGNOSES  Abdominal pain   Harvest Dark, MD 12/27/19 2323

## 2019-12-27 NOTE — ED Notes (Signed)
Pt drinking oral CT contrast currenlty.

## 2019-12-27 NOTE — ED Notes (Signed)
MD at bedside for disimpaction

## 2019-12-27 NOTE — ED Notes (Signed)
Pt finished with oral contrast. CT informed and patient ordered CT to noncontrast so will need to wait appropriate time for CT to be performed.

## 2019-12-27 NOTE — ED Triage Notes (Signed)
To ER via [facility] from home c/o bilateral lower abdominal pain that started today and constipation X 5 days. Denies NVD. Alert and oriented X 4. Pt reports tenderness on palpation.

## 2019-12-27 NOTE — ED Notes (Signed)
Pt was able to have a BM. Pt assisted to the bed from the toilet.

## 2019-12-28 MED ORDER — CEPHALEXIN 500 MG PO CAPS
500.0000 mg | ORAL_CAPSULE | Freq: Once | ORAL | Status: AC
  Administered 2019-12-28: 500 mg via ORAL
  Filled 2019-12-28: qty 1

## 2019-12-28 MED ORDER — CEPHALEXIN 500 MG PO CAPS: 500.0000 mg | ORAL_CAPSULE | Freq: Three times a day (TID) | ORAL | 0 refills | Status: AC

## 2019-12-28 NOTE — ED Provider Notes (Signed)
After an enema patient had a large bowel movement and feels markedly improved.  Urinalysis positive for UTI.  No signs of sepsis with no fever, no tachycardia.  Patient was started on Keflex.  Culture pending.   Alfred Levins, [place], MD 12/28/19 [phone]

## 2019-12-30 LAB — URINE CULTURE: Culture: 50000 — AB

## 2019-12-31 DIAGNOSIS — E1142 Type 2 diabetes mellitus with diabetic polyneuropathy: Secondary | ICD-10-CM | POA: Diagnosis not present

## 2019-12-31 DIAGNOSIS — I129 Hypertensive chronic kidney disease with stage 1 through stage 4 chronic kidney disease, or unspecified chronic kidney disease: Secondary | ICD-10-CM | POA: Diagnosis not present

## 2019-12-31 DIAGNOSIS — Z7984 Long term (current) use of oral hypoglycemic drugs: Secondary | ICD-10-CM | POA: Diagnosis not present

## 2019-12-31 DIAGNOSIS — Z8673 Personal history of transient ischemic attack (TIA), and cerebral infarction without residual deficits: Secondary | ICD-10-CM | POA: Diagnosis not present

## 2020-01-03 DIAGNOSIS — Z7901 Long term (current) use of anticoagulants: Secondary | ICD-10-CM | POA: Diagnosis not present

## 2020-01-03 DIAGNOSIS — I480 Paroxysmal atrial fibrillation: Secondary | ICD-10-CM | POA: Diagnosis not present

## 2020-01-31 DIAGNOSIS — I480 Paroxysmal atrial fibrillation: Secondary | ICD-10-CM | POA: Diagnosis not present

## 2020-01-31 DIAGNOSIS — Z7901 Long term (current) use of anticoagulants: Secondary | ICD-10-CM | POA: Diagnosis not present

## 2020-02-14 DIAGNOSIS — I480 Paroxysmal atrial fibrillation: Secondary | ICD-10-CM | POA: Diagnosis not present

## 2020-02-14 DIAGNOSIS — Z7901 Long term (current) use of anticoagulants: Secondary | ICD-10-CM | POA: Diagnosis not present

## 2020-02-21 DIAGNOSIS — C61 Malignant neoplasm of prostate: Secondary | ICD-10-CM | POA: Diagnosis not present

## 2020-02-21 DIAGNOSIS — M109 Gout, unspecified: Secondary | ICD-10-CM | POA: Diagnosis not present

## 2020-02-21 DIAGNOSIS — R634 Abnormal weight loss: Secondary | ICD-10-CM | POA: Diagnosis not present

## 2020-02-21 DIAGNOSIS — Z7901 Long term (current) use of anticoagulants: Secondary | ICD-10-CM | POA: Diagnosis not present

## 2020-02-21 DIAGNOSIS — D6869 Other thrombophilia: Secondary | ICD-10-CM | POA: Diagnosis not present

## 2020-02-21 DIAGNOSIS — E1142 Type 2 diabetes mellitus with diabetic polyneuropathy: Secondary | ICD-10-CM | POA: Diagnosis not present

## 2020-02-21 DIAGNOSIS — Z86711 Personal history of pulmonary embolism: Secondary | ICD-10-CM | POA: Diagnosis not present

## 2020-02-21 DIAGNOSIS — N1832 Chronic kidney disease, stage 3b: Secondary | ICD-10-CM | POA: Diagnosis not present

## 2020-02-21 DIAGNOSIS — E782 Mixed hyperlipidemia: Secondary | ICD-10-CM | POA: Diagnosis not present

## 2020-02-21 DIAGNOSIS — I129 Hypertensive chronic kidney disease with stage 1 through stage 4 chronic kidney disease, or unspecified chronic kidney disease: Secondary | ICD-10-CM | POA: Diagnosis not present

## 2020-02-21 DIAGNOSIS — N400 Enlarged prostate without lower urinary tract symptoms: Secondary | ICD-10-CM | POA: Diagnosis not present

## 2020-02-21 DIAGNOSIS — E1151 Type 2 diabetes mellitus with diabetic peripheral angiopathy without gangrene: Secondary | ICD-10-CM | POA: Diagnosis not present

## 2020-02-21 DIAGNOSIS — E1122 Type 2 diabetes mellitus with diabetic chronic kidney disease: Secondary | ICD-10-CM | POA: Diagnosis not present

## 2020-02-28 ENCOUNTER — Other Ambulatory Visit: Payer: Self-pay

## 2020-02-28 DIAGNOSIS — Z7901 Long term (current) use of anticoagulants: Secondary | ICD-10-CM | POA: Insufficient documentation

## 2020-02-28 DIAGNOSIS — E1122 Type 2 diabetes mellitus with diabetic chronic kidney disease: Secondary | ICD-10-CM | POA: Diagnosis not present

## 2020-02-28 DIAGNOSIS — Z8673 Personal history of transient ischemic attack (TIA), and cerebral infarction without residual deficits: Secondary | ICD-10-CM | POA: Diagnosis not present

## 2020-02-28 DIAGNOSIS — R58 Hemorrhage, not elsewhere classified: Secondary | ICD-10-CM | POA: Diagnosis not present

## 2020-02-28 DIAGNOSIS — I129 Hypertensive chronic kidney disease with stage 1 through stage 4 chronic kidney disease, or unspecified chronic kidney disease: Secondary | ICD-10-CM | POA: Insufficient documentation

## 2020-02-28 DIAGNOSIS — Z8546 Personal history of malignant neoplasm of prostate: Secondary | ICD-10-CM | POA: Insufficient documentation

## 2020-02-28 DIAGNOSIS — Z7984 Long term (current) use of oral hypoglycemic drugs: Secondary | ICD-10-CM | POA: Diagnosis not present

## 2020-02-28 DIAGNOSIS — Z7982 Long term (current) use of aspirin: Secondary | ICD-10-CM | POA: Insufficient documentation

## 2020-02-28 DIAGNOSIS — Z79899 Other long term (current) drug therapy: Secondary | ICD-10-CM | POA: Diagnosis not present

## 2020-02-28 DIAGNOSIS — R82998 Other abnormal findings in urine: Secondary | ICD-10-CM | POA: Diagnosis present

## 2020-02-28 DIAGNOSIS — N3001 Acute cystitis with hematuria: Secondary | ICD-10-CM | POA: Diagnosis not present

## 2020-02-28 DIAGNOSIS — Z85828 Personal history of other malignant neoplasm of skin: Secondary | ICD-10-CM | POA: Insufficient documentation

## 2020-02-28 DIAGNOSIS — N183 Chronic kidney disease, stage 3 unspecified: Secondary | ICD-10-CM | POA: Diagnosis not present

## 2020-02-28 NOTE — ED Triage Notes (Addendum)
Pt arrives to ED via [facility] from home with c/o "dark urine". Pt reports he has a caregiver that was concerned for dehydration and recommended he come in for evaluation. No c/o N/V/D or fever; no CP or SHOB. When asked if drinking plenty of fluids, pt states, "probably not enough". Pt states h/x of prostate issues and regularly self-caths; denies urinary retention. Pt is A&O, in NAD; RR even, regular and unlabored. Pt reports previous h/x of multiple strokes, placed in Sub-wait in a recliner for comfort. Pt given blanket, TV remote, and call bell and instructed to use if/when assistance is needed.

## 2020-02-29 ENCOUNTER — Emergency Department
Admission: EM | Admit: 2020-02-29 | Discharge: 2020-02-29 | Disposition: A | Discharge status: Home / Self Care | Payer: Medicare Other | Attending: Emergency Medicine | Admitting: Emergency Medicine

## 2020-02-29 DIAGNOSIS — N3001 Acute cystitis with hematuria: Secondary | ICD-10-CM

## 2020-02-29 LAB — URINALYSIS, COMPLETE (UACMP) WITH MICROSCOPIC
Bilirubin Urine: NEGATIVE
Glucose, UA: NEGATIVE mg/dL
Ketones, ur: NEGATIVE mg/dL
Nitrite: NEGATIVE
Protein, ur: 100 mg/dL — AB
Specific Gravity, Urine: 1.014 (ref 1.005–1.030)
WBC, UA: >50 WBC/hpf — ABNORMAL HIGH (ref 0–5)
pH: 8 (ref 5.0–8.0)

## 2020-02-29 LAB — COMPREHENSIVE METABOLIC PANEL
ALT: 16 U/L (ref 0–44)
AST: 18 U/L (ref 15–41)
Albumin: 2.7 g/dL — ABNORMAL LOW (ref 3.5–5.0)
Alkaline Phosphatase: 81 U/L (ref 38–126)
Anion gap: 9 (ref 5–15)
BUN: 69 mg/dL — ABNORMAL HIGH (ref 8–23)
CO2: 18 mmol/L — ABNORMAL LOW (ref 22–32)
Calcium: 8.1 mg/dL — ABNORMAL LOW (ref 8.9–10.3)
Chloride: 110 mmol/L (ref 98–111)
Creatinine, Ser: 2.52 mg/dL — ABNORMAL HIGH (ref 0.61–1.24)
GFR calc Af Amer: 26 mL/min — ABNORMAL LOW (ref >60)
GFR calc non Af Amer: 22 mL/min — ABNORMAL LOW (ref >60)
Glucose, Bld: 199 mg/dL — ABNORMAL HIGH (ref 70–99)
Potassium: 4.2 mmol/L (ref 3.5–5.1)
Sodium: 137 mmol/L (ref 135–145)
Total Bilirubin: 0.8 mg/dL (ref 0.3–1.2)
Total Protein: 6.3 g/dL — ABNORMAL LOW (ref 6.5–8.1)

## 2020-02-29 LAB — CBC WITH DIFFERENTIAL/PLATELET
Abs Immature Granulocytes: 0.08 10*3/uL — ABNORMAL HIGH (ref 0.00–0.07)
Basophils Absolute: 0.1 10*3/uL (ref 0.0–0.1)
Basophils Relative: 1 %
Eosinophils Absolute: 0.8 10*3/uL — ABNORMAL HIGH (ref 0.0–0.5)
Eosinophils Relative: 7 %
HCT: 37.9 % — ABNORMAL LOW (ref 39.0–52.0)
Hemoglobin: 12.4 g/dL — ABNORMAL LOW (ref 13.0–17.0)
Immature Granulocytes: 1 %
Lymphocytes Relative: 16 %
Lymphs Abs: 1.9 10*3/uL (ref 0.7–4.0)
MCH: 31.7 pg (ref 26.0–34.0)
MCHC: 32.7 g/dL (ref 30.0–36.0)
MCV: 96.9 fL (ref 80.0–100.0)
Monocytes Absolute: 1.1 10*3/uL — ABNORMAL HIGH (ref 0.1–1.0)
Monocytes Relative: 9 %
Neutro Abs: 7.7 10*3/uL (ref 1.7–7.7)
Neutrophils Relative %: 66 %
Platelets: 220 10*3/uL (ref 150–400)
RBC: 3.91 MIL/uL — ABNORMAL LOW (ref 4.22–5.81)
RDW: 13.3 % (ref 11.5–15.5)
Smear Review: NORMAL
WBC: 11.5 10*3/uL — ABNORMAL HIGH (ref 4.0–10.5)
nRBC: 0 % (ref 0.0–0.2)

## 2020-02-29 LAB — LACTIC ACID, PLASMA: Lactic Acid, Venous: 1.4 mmol/L (ref 0.5–1.9)

## 2020-02-29 LAB — PROTIME-INR
INR: 2.3 — ABNORMAL HIGH (ref 0.8–1.2)
Prothrombin Time: 24.8 seconds — ABNORMAL HIGH (ref 11.4–15.2)

## 2020-02-29 MED ORDER — LIDOCAINE HCL URETHRAL/MUCOSAL 2 % EX GEL
1.0000 "application " | Freq: Once | CUTANEOUS | Status: DC
  Filled 2020-02-29: qty 10

## 2020-02-29 MED ORDER — SODIUM CHLORIDE 0.9 % IV SOLN
1.0000 g | Freq: Once | INTRAVENOUS | Status: AC
  Administered 2020-02-29: 1 g via INTRAVENOUS
  Filled 2020-02-29: qty 10

## 2020-02-29 MED ORDER — SODIUM CHLORIDE 0.9 % IV BOLUS
1000.0000 mL | Freq: Once | INTRAVENOUS | Status: AC
  Administered 2020-02-29: 1000 mL via INTRAVENOUS

## 2020-02-29 MED ORDER — CEPHALEXIN 500 MG PO CAPS: 500.0000 mg | ORAL_CAPSULE | Freq: Three times a day (TID) | ORAL | 0 refills | Status: AC

## 2020-02-29 NOTE — ED Notes (Signed)
Called pt's son Landynn Dupler and informed him of pt being ready for discharge. Per son pt's nurse will come and pick him up.  Will update patient.

## 2020-02-29 NOTE — ED Notes (Signed)
Son unable to pick up pt. Son gave new number to try and reach nurse of pt.  Called new number and was finally able to get in contact with Rodena Piety. She states she would be able to come and get pt around 12.

## 2020-02-29 NOTE — ED Provider Notes (Signed)
[facility] Emergency Department Provider Note  ____________________________________________  Time seen: Approximately 3:23 AM  I have reviewed the triage vital signs and the nursing notes.   HISTORY  Chief Complaint Dehydration   HPI Richard [name] is a 86 y.o. male with a history of remote prostate cancer, hypotonic bladder requiring daily catheterizations, diabetes, stroke, A. fib on Coumadin, chronic kidney disease who presents for evaluation of dark urine.  Patient's home health nurse was concerned the patient was dehydrated after she cath him today and noticed that his urine was very dark and had a foul odor.  Patient denies abdominal pain, fever chills, nausea or vomiting, dysuria, flank pain, chest pain or shortness of breath.  He denies hematuria, melena, hematochezia, hematemesis, coffee-ground emesis.   Past Medical History:  Diagnosis Date  . Atrial fibrillation ([place])   . Cancer ([place])   . Chronic kidney disease   . Colon polyp   . Diabetes mellitus   . History of blood clotting disorder   . HTN (hypertension)     Patient Active Problem List   Diagnosis Date Noted  . Stroke (cerebrum) ([place]) 11/13/2019  . Acute ischemic stroke ([place])   . Urinary tract infection without hematuria   . AKI (acute kidney injury) ([place])   . Altered mental status 11/12/2019  . Subdural hematoma ([place]) 12/28/2018  . PAF (paroxysmal atrial fibrillation) ([place]) 12/28/2018  . Diabetes ([place]) 12/28/2018  . CKD (chronic kidney disease), stage III 12/28/2018  . HTN (hypertension) 12/28/2018  . Pressure injury of skin 11/30/2018  . Acute osteomyelitis of toe of right foot ([place]) 11/28/2018  . Malignant tumor of prostate ([place]) 10/26/2015  . Urinary tract infection with hematuria 01/11/2015  . Bladder neck obstruction 11/04/2012  . 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 Prostate cancer ([place]) 11/04/2012  . Retention of urine 05/06/2012  . History of hematuria 01/30/2012  .  Malignant neoplasm of prostate ([place]) 09/30/2011  . Gross hematuria 09/19/2011  . Benign prostatic hyperplasia with urinary retention 09/10/2011  . Elevated prostate specific antigen (PSA) 09/10/2011    Past Surgical History:  Procedure Laterality Date  . AMPUTATION TOE Right 11/29/2018   Procedure: AMPUTATION TOE;  Surgeon: Albertine Patricia, DPM;  Location: [facility] ORS;  Service: Podiatry;  Laterality: Right;  . EYE SURGERY     CATARACTS  . HERNIA REPAIR    . LOWER EXTREMITY ANGIOGRAPHY Right 12/02/2018   Procedure: Lower Extremity Angiography;  Surgeon: Algernon Huxley, MD;  Location: [facility];  Service: Cardiovascular;  Laterality: Right;  . SKIN CANCER EXCISION      Prior to Admission medications   Medication Sig Start Date End Date Taking? Authorizing Provider  Alpha-Lipoic Acid 200 MG CAPS Take 1 capsule by mouth daily.     [provider]  ALPRAZolam Duanne Moron) 0.25 MG tablet Take 0.25 mg by mouth at bedtime as needed.      [provider]  aspirin 81 MG chewable tablet Chew 1 tablet (81 mg total) by mouth daily. 11/17/19   Lorella Nimrod, MD  atorvastatin (LIPITOR) 40 MG tablet Take 1 tablet (40 mg total) by mouth daily. 11/17/19   Lorella Nimrod, MD  Calcium Carbonate (CALCIUM 600 PO) Take 1 tablet by mouth daily.     [provider]  cephALEXin (KEFLEX) 500 MG capsule Take 1 capsule (500 mg total) by mouth 3 (three) times daily for 7 days. 02/29/20 03/07/20  Rudene Re, MD  Cholecalciferol (VITAMIN D) 2000 UNITS tablet Take 2,000  Units by mouth daily.      [provider]  CIALIS 5 MG tablet Take 2.5 mg by mouth daily.  06/16/17   [provider]  Coenzyme Q10 10 MG capsule Take 10 mg by mouth.    [provider]  colchicine 0.6 MG tablet Take 0.6 mg by mouth.    [provider]  febuxostat (ULORIC) 40 MG tablet Take 1 tablet by mouth daily.     [provider]  glimepiride (AMARYL) 4 MG tablet Take 4  mg by mouth daily with breakfast.  06/15/17   [provider]  lisinopril (ZESTRIL) 10 MG tablet Take 1 tablet (10 mg total) by mouth daily. 11/17/19   Lorella Nimrod, MD  metoprolol tartrate (LOPRESSOR) 25 MG tablet Take 0.5 tablets (12.5 mg total) by mouth 2 (two) times daily. 12/30/18   Demetrios Loll, MD  Multiple Vitamins-Minerals (MULTIVITAMIN ADULT EXTRA C PO) Take 1 tablet by mouth daily.     [provider]  nystatin-triamcinolone (MYCOLOG II) cream Apply 1 application topically 2 (two) times daily.  05/08/16   [provider]  Red Yeast Rice 600 MG TABS Take 1 tablet by mouth daily.     [provider]  senna-docusate (SENOKOT-S) 8.6-50 MG tablet Take 1 tablet by mouth 2 (two) times daily. 01/04/19   Gladstone Lighter, MD  Vitamins A & D (VITAMIN A & D) [address] UNITS TABS Take 1 tablet by mouth daily.     [provider]  warfarin (COUMADIN) 5 MG tablet Take 5 mg by mouth daily. 09/22/19   [provider]    Allergies Patient has no known allergies.  No family history on file.  Social History Social History   Tobacco Use  . Smoking status: Never Smoker  . Smokeless tobacco: Never Used  Substance Use Topics  . Alcohol use: No  . Drug use: Never    Review of Systems  Constitutional: Negative for fever. Eyes: Negative for visual changes. ENT: Negative for sore throat. Neck: No neck pain  Cardiovascular: Negative for chest pain. Respiratory: Negative for shortness of breath. Gastrointestinal: Negative for abdominal pain, vomiting or diarrhea. Genitourinary: Negative for dysuria. + concentrated urine Musculoskeletal: Negative for back pain. Skin: Negative for rash. Neurological: Negative for headaches, weakness or numbness. Psych: No SI or HI  ____________________________________________   PHYSICAL EXAM:  VITAL SIGNS: ED Triage Vitals  Enc Vitals Group     BP 02/28/20 2258 94/60     Pulse Rate 02/28/20 2258 76      Resp 02/28/20 2258 17     Temp 02/28/20 2258 97.8 F (36.6 C)     Temp Source 02/28/20 2258 Oral     SpO2 02/28/20 2258 95 %     Weight 02/28/20 2259 220 lb (99.8 kg)     Height 02/28/20 2259 6\' 1"  (1.854 m)     Head Circumference --      Peak Flow --      Pain Score 02/28/20 2259 0     Pain Loc --      Pain Edu? --      Excl. in [place]? --     Constitutional: Alert and oriented. Well appearing and in no apparent distress. HEENT:      Head: Normocephalic and atraumatic.         Eyes: Conjunctivae are normal. Sclera is non-icteric.       Mouth/Throat: Mucous membranes are moist.       Neck: Supple with  no signs of meningismus. Cardiovascular: Regular rate and rhythm. No murmurs, gallops, or rubs. 2+ symmetrical distal pulses are present in all extremities. No JVD. Respiratory: Normal respiratory effort. Lungs are clear to auscultation bilaterally. No wheezes, crackles, or rhonchi.  Gastrointestinal: Soft, mild suprapubic tenderness, and non distended with positive bowel sounds. No rebound or guarding. Genitourinary: No CVA tenderness. Catheterization yielded purulent, foul-smelling urine Musculoskeletal: Nontender with normal range of motion in all extremities. No edema, cyanosis, or erythema of extremities. Neurologic: Normal speech and language. Face is symmetric. Moving all extremities. No gross focal neurologic deficits are appreciated. Skin: Skin is warm, dry and intact. No rash noted. Psychiatric: Mood and affect are normal. Speech and behavior are normal.  ____________________________________________   LABS (all labs ordered are listed, but only abnormal results are displayed)  Labs Reviewed  CBC WITH DIFFERENTIAL/PLATELET - Abnormal; Notable for the following components:      Result Value   WBC 11.5 (*)    RBC 3.91 (*)    Hemoglobin 12.4 (*)    HCT 37.9 (*)    Monocytes Absolute 1.1 (*)    Eosinophils Absolute 0.8 (*)    Abs Immature Granulocytes 0.08 (*)    All other  components within normal limits  COMPREHENSIVE METABOLIC PANEL - Abnormal; Notable for the following components:   CO2 18 (*)    Glucose, Bld 199 (*)    BUN 69 (*)    Creatinine, Ser 2.52 (*)    Calcium 8.1 (*)    Total Protein 6.3 (*)    Albumin 2.7 (*)    GFR calc non Af Amer 22 (*)    GFR calc Af Amer 26 (*)    All other components within normal limits  URINALYSIS, COMPLETE (UACMP) WITH MICROSCOPIC - Abnormal; Notable for the following components:   Color, Urine YELLOW (*)    APPearance TURBID (*)    Hgb urine dipstick SMALL (*)    Protein, ur 100 (*)    Leukocytes,Ua MODERATE (*)    WBC, UA >50 (*)    Bacteria, UA MANY (*)    All other components within normal limits  PROTIME-INR - Abnormal; Notable for the following components:   Prothrombin Time 24.8 (*)    INR 2.3 (*)    All other components within normal limits  URINE CULTURE  LACTIC ACID, PLASMA   ____________________________________________  EKG  none  ____________________________________________  RADIOLOGY  none ____________________________________________   PROCEDURES  Procedure(s) performed: None Procedures Critical Care performed:  None ____________________________________________   INITIAL IMPRESSION / ASSESSMENT AND PLAN / ED COURSE   86 y.o. male with a history of remote prostate cancer, hypotonic bladder requiring daily catheterizations, diabetes, stroke, A. fib on Coumadin, chronic kidney disease who presents for evaluation of dark urine and concerns of dehydration.  Patient denies any symptoms.  He is afebrile with no tachycardia but slightly hypotensive with a BP of 94/60.  Catheterization at bedside revealed foul smelling purulent urine and a UA consistent with a urinary tract infection.  Patient with mild leukocytosis but no left shift, normal lactic acid. No signs of sepsis.  Review of patient's medical record shows prior infections with E. coli and Klebsiella both sensitive to Rocephin.   Patient was given a dose of IV Rocephin and will be discharged on Keflex.  Patient does have evidence of anemia with hemoglobin of 12.4.  Patient's most recent blood work done 2 months ago showed hemoglobin of 15.4.  Patient denies any source of bleeding.  There is no blood in his urine.  Rectal exam showing brown stool guaiac negative. Patient given IVF for mild dehydration.  Creatinine at baseline. Foley catheter placed to help clear infection. Recommended home health nurse to remove foley and resume I&O caths once abx course is over. Discussed follow up with PCP in standard return precautions for any signs of worsening infection and sepsis.      _____________________________________________ Please note:  Patient was evaluated in Emergency Department today for the symptoms described in the history of present illness. Patient was evaluated in the context of the global COVID-19 pandemic, which necessitated consideration that the patient might be at risk for infection with the SARS-CoV-2 virus that causes COVID-19. Institutional protocols and algorithms that pertain to the evaluation of patients at risk for COVID-19 are in a state of rapid change based on information released by regulatory bodies including the CDC and federal and state organizations. These policies and algorithms were followed during the patient's care in the ED.  Some ED evaluations and interventions may be delayed as a result of limited staffing during the pandemic.   ____________________________________________   FINAL CLINICAL IMPRESSION(S) / ED DIAGNOSES   Final diagnoses:  Acute cystitis with hematuria      NEW MEDICATIONS STARTED DURING THIS VISIT:  ED Discharge Orders         Ordered    cephALEXin (KEFLEX) 500 MG capsule  3 times daily     02/29/20 0540           Note:  This document was prepared using Dragon voice recognition software and may include unintentional dictation errors.    Alfred Levins, [place],  MD 02/29/20 [phone]

## 2020-02-29 NOTE — ED Notes (Signed)
Pt given breakfast tray

## 2020-02-29 NOTE — ED Notes (Signed)
Called Rodena Piety and she is almost here to pick up pt.   Discharge instructions reviewed with Rodena Piety over  The phone and will do in person as well. Pt unable to sign discharge

## 2020-02-29 NOTE — ED Notes (Signed)
Attempted to call nurse of pt with no answer. Called son of pt and he request this RN to call back shortly.

## 2020-02-29 NOTE — Discharge Instructions (Signed)
Catheter care  Always wash your hands before and after touching your catheter.  Check the area around the urethra for inflammation or signs of infection. Signs of infection include irritated, swollen, red, or tender skin, or pus around the catheter.  Clean the area around the catheter with soap and water two times a day. Dry with a clean towel afterward.  Do not apply powder or lotion to the skin around the catheter.  To empty the urine collection bag  Wash your hands with soap and water.  Without touching the drain spout, remove the spout from its sleeve at the bottom of the collection bag. Open the valve on the spout.  Let the urine flow out of the bag and into the toilet or a container. Do not let the tubing or drain spout touch anything.  After you empty the bag, clean the end of the drain spout with tissue and water. Close the valve and put the drain spout back into its sleeve at the bottom of the collection bag.  Wash your hands with soap and water.   Your home health nurse may remove the foley once your antibiotics course is done, and you may resume in and out catheterization.

## 2020-03-02 LAB — URINE CULTURE: Culture: >=100000 — AB

## 2020-03-13 ENCOUNTER — Ambulatory Visit (INDEPENDENT_AMBULATORY_CARE_PROVIDER_SITE_OTHER): Payer: Medicare Other | Admitting: Urology

## 2020-03-13 ENCOUNTER — Other Ambulatory Visit: Payer: Self-pay

## 2020-03-13 ENCOUNTER — Encounter: Payer: Self-pay | Admitting: Urology

## 2020-03-13 VITALS — BP 110/62 | HR 60 | Ht 72.0 in | Wt 220.0 lb

## 2020-03-13 DIAGNOSIS — N312 Flaccid neuropathic bladder, not elsewhere classified: Secondary | ICD-10-CM

## 2020-03-13 DIAGNOSIS — C61 Malignant neoplasm of prostate: Secondary | ICD-10-CM | POA: Diagnosis not present

## 2020-03-13 DIAGNOSIS — N390 Urinary tract infection, site not specified: Secondary | ICD-10-CM | POA: Diagnosis not present

## 2020-03-13 MED ORDER — CIPROFLOXACIN HCL 500 MG PO TABS
500.0000 mg | ORAL_TABLET | Freq: Once | ORAL | Status: AC
  Administered 2020-03-13: 500 mg via ORAL

## 2020-03-13 NOTE — Progress Notes (Signed)
   03/13/2020 11:16 AM   Wendie Simmer Jul 03, 1931 [id number]  Reason for visit: Follow up atonic bladder, prostate cancer, UTI  HPI: I saw Mr. Duchesne and his caregiver in urology clinic today for Foley removal after an acute UTI.  To briefly summarize, he is a very comorbid and demented 86 year old male with prostate cancer on watchful waiting(last PSA 54 in June 2020) and atonic bladder managed with long-term CIC 3 times daily, who was recently found to have an E. coli UTI on 02/29/2020 with CT showing a distended bladder and upstream hydronephrosis.  There was also significant constipation and fecal impaction at that time.  A Foley catheter was placed for decompression and he was treated with culture appropriate antibiotics with improvement in his symptoms and mentation.  His bowels are more regular now.  He would like to resume CIC.  Notably, he seems to have a poor social support network and has had multiple changes in his caretaker.  I again reviewed at length with the patient and his new caregiver that he has prostate cancer, but no evidence of metastatic disease on imaging from July 2020, or January 2021.  His PSA has been relatively stable around 50-60 over the last 2 years.  Again, we reviewed treatment options including ADT or radiation, versus continuing watchful waiting, and I think with his numerous comorbidities and stability over the last 5 to 10 years, he would not be a good candidate for treatment.  We also discussed options regarding his bladder management as he is becoming older, more fragile, and having more problems with dementia.  He could continue intermittent catheterization 3-4 times daily, versus have his bladder managed with a chronic Foley that is changed on a monthly basis versus suprapubic tube.  He would like to continue CIC at this time, but he is open to considering a chronic Foley or SP tube in the future.  My sense from our visit today is that he has become much more  frail than when I saw him last in the summer 2020, and will likely need to transition to a chronic Foley or SP tube in the near future.  We discussed return precautions at length.  -Cipro x1 given and Foley removed, resume intermittent catheterization 3-4 times daily -Continue to push fluids and manage constipation, as is likely contributing to his UTIs -RTC 4 months with renal ultrasound to confirm resolution of hydronephrosis-if persistent hydro or AKI reconsider transition to chronic Foley/SP tube, PSA prior to that visit for watchful waiting for prostate cancer   I spent 30 total minutes on the day of the encounter including pre-visit review of the medical record, face-to-face time with the patient, and post visit ordering of labs/imaging/tests.  Billey Co, [facility] [address], [place], [place] [address] [phone]

## 2020-03-29 ENCOUNTER — Observation Stay: Payer: Medicare Other

## 2020-03-29 ENCOUNTER — Emergency Department: Payer: Medicare Other

## 2020-03-29 ENCOUNTER — Observation Stay
Admit: 2020-03-29 | Discharge: 2020-03-29 | Disposition: A | Discharge status: Home / Self Care | Payer: Medicare Other | Attending: Internal Medicine | Admitting: Internal Medicine

## 2020-03-29 ENCOUNTER — Other Ambulatory Visit: Payer: Self-pay

## 2020-03-29 ENCOUNTER — Inpatient Hospital Stay
Admission: EM | Admit: 2020-03-29 | Discharge: 2020-04-15 | DRG: 064 | Disposition: E | Payer: Medicare Other | Attending: Internal Medicine | Admitting: Internal Medicine

## 2020-03-29 DIAGNOSIS — I10 Essential (primary) hypertension: Secondary | ICD-10-CM | POA: Diagnosis not present

## 2020-03-29 DIAGNOSIS — Z7901 Long term (current) use of anticoagulants: Secondary | ICD-10-CM

## 2020-03-29 DIAGNOSIS — I480 Paroxysmal atrial fibrillation: Secondary | ICD-10-CM | POA: Diagnosis present

## 2020-03-29 DIAGNOSIS — Z20822 Contact with and (suspected) exposure to covid-19: Secondary | ICD-10-CM | POA: Diagnosis not present

## 2020-03-29 DIAGNOSIS — L89301 Pressure ulcer of unspecified buttock, stage 1: Secondary | ICD-10-CM | POA: Diagnosis present

## 2020-03-29 DIAGNOSIS — L89152 Pressure ulcer of sacral region, stage 2: Secondary | ICD-10-CM | POA: Diagnosis present

## 2020-03-29 DIAGNOSIS — R29810 Facial weakness: Secondary | ICD-10-CM | POA: Diagnosis not present

## 2020-03-29 DIAGNOSIS — Z66 Do not resuscitate: Secondary | ICD-10-CM | POA: Diagnosis not present

## 2020-03-29 DIAGNOSIS — R471 Dysarthria and anarthria: Secondary | ICD-10-CM | POA: Diagnosis present

## 2020-03-29 DIAGNOSIS — L89321 Pressure ulcer of left buttock, stage 1: Secondary | ICD-10-CM | POA: Diagnosis present

## 2020-03-29 DIAGNOSIS — R1312 Dysphagia, oropharyngeal phase: Secondary | ICD-10-CM | POA: Diagnosis not present

## 2020-03-29 DIAGNOSIS — R197 Diarrhea, unspecified: Secondary | ICD-10-CM | POA: Diagnosis not present

## 2020-03-29 DIAGNOSIS — E440 Moderate protein-calorie malnutrition: Secondary | ICD-10-CM | POA: Insufficient documentation

## 2020-03-29 DIAGNOSIS — R4781 Slurred speech: Secondary | ICD-10-CM | POA: Diagnosis not present

## 2020-03-29 DIAGNOSIS — I129 Hypertensive chronic kidney disease with stage 1 through stage 4 chronic kidney disease, or unspecified chronic kidney disease: Secondary | ICD-10-CM | POA: Diagnosis not present

## 2020-03-29 DIAGNOSIS — I69351 Hemiplegia and hemiparesis following cerebral infarction affecting right dominant side: Secondary | ICD-10-CM

## 2020-03-29 DIAGNOSIS — N184 Chronic kidney disease, stage 4 (severe): Secondary | ICD-10-CM

## 2020-03-29 DIAGNOSIS — D6859 Other primary thrombophilia: Secondary | ICD-10-CM | POA: Diagnosis present

## 2020-03-29 DIAGNOSIS — Z515 Encounter for palliative care: Secondary | ICD-10-CM | POA: Diagnosis not present

## 2020-03-29 DIAGNOSIS — I639 Cerebral infarction, unspecified: Secondary | ICD-10-CM

## 2020-03-29 DIAGNOSIS — M109 Gout, unspecified: Secondary | ICD-10-CM | POA: Diagnosis present

## 2020-03-29 DIAGNOSIS — Z8546 Personal history of malignant neoplasm of prostate: Secondary | ICD-10-CM

## 2020-03-29 DIAGNOSIS — R000 Tachycardia, unspecified: Secondary | ICD-10-CM | POA: Diagnosis not present

## 2020-03-29 DIAGNOSIS — Z79899 Other long term (current) drug therapy: Secondary | ICD-10-CM

## 2020-03-29 DIAGNOSIS — Z7982 Long term (current) use of aspirin: Secondary | ICD-10-CM

## 2020-03-29 DIAGNOSIS — Z89421 Acquired absence of other right toe(s): Secondary | ICD-10-CM

## 2020-03-29 DIAGNOSIS — I130 Hypertensive heart and chronic kidney disease with heart failure and stage 1 through stage 4 chronic kidney disease, or unspecified chronic kidney disease: Secondary | ICD-10-CM | POA: Diagnosis present

## 2020-03-29 DIAGNOSIS — E1122 Type 2 diabetes mellitus with diabetic chronic kidney disease: Secondary | ICD-10-CM | POA: Diagnosis present

## 2020-03-29 DIAGNOSIS — D6851 Activated protein C resistance: Secondary | ICD-10-CM | POA: Diagnosis present

## 2020-03-29 DIAGNOSIS — N189 Chronic kidney disease, unspecified: Secondary | ICD-10-CM | POA: Diagnosis not present

## 2020-03-29 DIAGNOSIS — I6200 Nontraumatic subdural hemorrhage, unspecified: Secondary | ICD-10-CM | POA: Diagnosis not present

## 2020-03-29 DIAGNOSIS — I5030 Unspecified diastolic (congestive) heart failure: Secondary | ICD-10-CM | POA: Diagnosis present

## 2020-03-29 DIAGNOSIS — Z8719 Personal history of other diseases of the digestive system: Secondary | ICD-10-CM

## 2020-03-29 DIAGNOSIS — I6389 Other cerebral infarction: Secondary | ICD-10-CM | POA: Diagnosis not present

## 2020-03-29 DIAGNOSIS — Z85828 Personal history of other malignant neoplasm of skin: Secondary | ICD-10-CM

## 2020-03-29 DIAGNOSIS — Z4659 Encounter for fitting and adjustment of other gastrointestinal appliance and device: Secondary | ICD-10-CM

## 2020-03-29 DIAGNOSIS — E1165 Type 2 diabetes mellitus with hyperglycemia: Secondary | ICD-10-CM | POA: Diagnosis not present

## 2020-03-29 DIAGNOSIS — R29702 NIHSS score 2: Secondary | ICD-10-CM | POA: Diagnosis present

## 2020-03-29 DIAGNOSIS — Z0189 Encounter for other specified special examinations: Secondary | ICD-10-CM

## 2020-03-29 DIAGNOSIS — N390 Urinary tract infection, site not specified: Secondary | ICD-10-CM | POA: Diagnosis present

## 2020-03-29 DIAGNOSIS — Z86711 Personal history of pulmonary embolism: Secondary | ICD-10-CM

## 2020-03-29 DIAGNOSIS — I6523 Occlusion and stenosis of bilateral carotid arteries: Secondary | ICD-10-CM | POA: Diagnosis not present

## 2020-03-29 DIAGNOSIS — E1151 Type 2 diabetes mellitus with diabetic peripheral angiopathy without gangrene: Secondary | ICD-10-CM

## 2020-03-29 DIAGNOSIS — I69391 Dysphagia following cerebral infarction: Secondary | ICD-10-CM

## 2020-03-29 DIAGNOSIS — N179 Acute kidney failure, unspecified: Secondary | ICD-10-CM | POA: Diagnosis not present

## 2020-03-29 DIAGNOSIS — M7989 Other specified soft tissue disorders: Secondary | ICD-10-CM | POA: Diagnosis present

## 2020-03-29 DIAGNOSIS — E870 Hyperosmolality and hypernatremia: Secondary | ICD-10-CM | POA: Diagnosis not present

## 2020-03-29 DIAGNOSIS — Z6825 Body mass index (BMI) 25.0-25.9, adult: Secondary | ICD-10-CM

## 2020-03-29 DIAGNOSIS — R531 Weakness: Secondary | ICD-10-CM | POA: Diagnosis not present

## 2020-03-29 DIAGNOSIS — N319 Neuromuscular dysfunction of bladder, unspecified: Secondary | ICD-10-CM

## 2020-03-29 LAB — COMPREHENSIVE METABOLIC PANEL
ALT: 17 U/L (ref 0–44)
AST: 22 U/L (ref 15–41)
Albumin: 2.9 g/dL — ABNORMAL LOW (ref 3.5–5.0)
Alkaline Phosphatase: 95 U/L (ref 38–126)
Anion gap: 9 (ref 5–15)
BUN: 50 mg/dL — ABNORMAL HIGH (ref 8–23)
CO2: 22 mmol/L (ref 22–32)
Calcium: 8.4 mg/dL — ABNORMAL LOW (ref 8.9–10.3)
Chloride: 109 mmol/L (ref 98–111)
Creatinine, Ser: 2.24 mg/dL — ABNORMAL HIGH (ref 0.61–1.24)
GFR calc Af Amer: 30 mL/min — ABNORMAL LOW (ref >60)
GFR calc non Af Amer: 26 mL/min — ABNORMAL LOW (ref >60)
Glucose, Bld: 158 mg/dL — ABNORMAL HIGH (ref 70–99)
Potassium: 4.6 mmol/L (ref 3.5–5.1)
Sodium: 140 mmol/L (ref 135–145)
Total Bilirubin: 0.6 mg/dL (ref 0.3–1.2)
Total Protein: 7 g/dL (ref 6.5–8.1)

## 2020-03-29 LAB — APTT: aPTT: 40 seconds — ABNORMAL HIGH (ref 24–36)

## 2020-03-29 LAB — CBC
HCT: 41.2 % (ref 39.0–52.0)
Hemoglobin: 13.5 g/dL (ref 13.0–17.0)
MCH: 32.2 pg (ref 26.0–34.0)
MCHC: 32.8 g/dL (ref 30.0–36.0)
MCV: 98.3 fL (ref 80.0–100.0)
Platelets: 221 10*3/uL (ref 150–400)
RBC: 4.19 MIL/uL — ABNORMAL LOW (ref 4.22–5.81)
RDW: 13.2 % (ref 11.5–15.5)
WBC: 14.3 10*3/uL — ABNORMAL HIGH (ref 4.0–10.5)
nRBC: 0 % (ref 0.0–0.2)

## 2020-03-29 LAB — GLUCOSE, CAPILLARY
Glucose-Capillary: 113 mg/dL — ABNORMAL HIGH (ref 70–99)
Glucose-Capillary: 104 mg/dL — ABNORMAL HIGH (ref 70–99)
Glucose-Capillary: 104 mg/dL — ABNORMAL HIGH (ref 70–99)
Glucose-Capillary: 135 mg/dL — ABNORMAL HIGH (ref 70–99)

## 2020-03-29 LAB — LIPID PANEL
Cholesterol: 128 mg/dL (ref 0–200)
HDL: 36 mg/dL — ABNORMAL LOW (ref >40)
LDL Cholesterol: 75 mg/dL (ref 0–99)
Total CHOL/HDL Ratio: 3.6 RATIO
Triglycerides: 85 mg/dL (ref <150)
VLDL: 17 mg/dL (ref 0–40)

## 2020-03-29 LAB — ECHOCARDIOGRAM COMPLETE
Height: 72 in
Weight: 3520 oz

## 2020-03-29 LAB — DIFFERENTIAL
Abs Immature Granulocytes: 0.07 10*3/uL (ref 0.00–0.07)
Basophils Absolute: 0.1 10*3/uL (ref 0.0–0.1)
Basophils Relative: 0 %
Eosinophils Absolute: 1 10*3/uL — ABNORMAL HIGH (ref 0.0–0.5)
Eosinophils Relative: 7 %
Immature Granulocytes: 1 %
Lymphocytes Relative: 21 %
Lymphs Abs: 2.9 10*3/uL (ref 0.7–4.0)
Monocytes Absolute: 1.2 10*3/uL — ABNORMAL HIGH (ref 0.1–1.0)
Monocytes Relative: 9 %
Neutro Abs: 9 10*3/uL — ABNORMAL HIGH (ref 1.7–7.7)
Neutrophils Relative %: 62 %

## 2020-03-29 LAB — URINE DRUG SCREEN, QUALITATIVE (ARMC ONLY)
Amphetamines, Ur Screen: NOT DETECTED
Barbiturates, Ur Screen: NOT DETECTED
Benzodiazepine, Ur Scrn: NOT DETECTED
Cannabinoid 50 Ng, Ur ~~LOC~~: NOT DETECTED
Cocaine Metabolite,Ur ~~LOC~~: NOT DETECTED
MDMA (Ecstasy)Ur Screen: NOT DETECTED
Methadone Scn, Ur: NOT DETECTED
Opiate, Ur Screen: NOT DETECTED
Phencyclidine (PCP) Ur S: NOT DETECTED
Tricyclic, Ur Screen: NOT DETECTED

## 2020-03-29 LAB — URINALYSIS, COMPLETE (UACMP) WITH MICROSCOPIC
Bilirubin Urine: NEGATIVE
Glucose, UA: NEGATIVE mg/dL
Ketones, ur: NEGATIVE mg/dL
Nitrite: NEGATIVE
Protein, ur: >=300 mg/dL — AB
Specific Gravity, Urine: 1.012 (ref 1.005–1.030)
WBC, UA: >50 WBC/hpf — ABNORMAL HIGH (ref 0–5)
pH: 6 (ref 5.0–8.0)

## 2020-03-29 LAB — RESPIRATORY PANEL BY RT PCR (FLU A&B, COVID)
Influenza A by PCR: NEGATIVE
Influenza B by PCR: NEGATIVE
SARS Coronavirus 2 by RT PCR: NEGATIVE

## 2020-03-29 LAB — PROTIME-INR
INR: 2.2 — ABNORMAL HIGH (ref 0.8–1.2)
Prothrombin Time: 24.5 seconds — ABNORMAL HIGH (ref 11.4–15.2)

## 2020-03-29 LAB — ETHANOL: Alcohol, Ethyl (B): <10 mg/dL (ref <10)

## 2020-03-29 MED ORDER — STROKE: EARLY STAGES OF RECOVERY BOOK: Freq: Once | Status: DC

## 2020-03-29 MED ORDER — ATORVASTATIN CALCIUM 20 MG PO TABS
40.0000 mg | ORAL_TABLET | Freq: Every day | ORAL | Status: DC
  Administered 2020-03-31 – 2020-04-05 (×5): 40 mg via ORAL
  Filled 2020-03-29 (×6): qty 2

## 2020-03-29 MED ORDER — ACETAMINOPHEN 160 MG/5ML PO SOLN
650.0000 mg | ORAL | Status: DC | PRN
  Filled 2020-03-29: qty 20.3

## 2020-03-29 MED ORDER — ACETAMINOPHEN 325 MG PO TABS: 650.0000 mg | ORAL_TABLET | ORAL | Status: DC | PRN

## 2020-03-29 MED ORDER — METOPROLOL TARTRATE 25 MG PO TABS
12.5000 mg | ORAL_TABLET | Freq: Two times a day (BID) | ORAL | Status: DC
  Filled 2020-03-29: qty 1

## 2020-03-29 MED ORDER — ACETAMINOPHEN 650 MG RE SUPP
650.0000 mg | RECTAL | Status: DC | PRN
  Administered 2020-03-30: 21:00:00 650 mg via RECTAL
  Filled 2020-03-29: qty 1

## 2020-03-29 MED ORDER — SODIUM CHLORIDE 0.9 % IV SOLN: Freq: Once | INTRAVENOUS | Status: AC

## 2020-03-29 MED ORDER — WARFARIN SODIUM 5 MG PO TABS
5.0000 mg | ORAL_TABLET | Freq: Every day | ORAL | Status: AC
  Filled 2020-03-29: qty 1

## 2020-03-29 MED ORDER — ASPIRIN 81 MG PO CHEW
81.0000 mg | CHEWABLE_TABLET | Freq: Every day | ORAL | Status: DC
  Administered 2020-03-31 – 2020-04-05 (×5): 81 mg via ORAL
  Filled 2020-03-29 (×6): qty 1

## 2020-03-29 MED ORDER — INSULIN ASPART 100 UNIT/ML ~~LOC~~ SOLN: 0.0000 [IU] | Freq: Every day | SUBCUTANEOUS | Status: DC

## 2020-03-29 MED ORDER — INSULIN ASPART 100 UNIT/ML ~~LOC~~ SOLN
0.0000 [IU] | Freq: Three times a day (TID) | SUBCUTANEOUS | Status: DC
  Administered 2020-03-30 (×3): 2 [IU] via SUBCUTANEOUS
  Administered 2020-03-31: 09:00:00 3 [IU] via SUBCUTANEOUS
  Administered 2020-03-31 (×2): 2 [IU] via SUBCUTANEOUS
  Administered 2020-04-01: 17:00:00 3 [IU] via SUBCUTANEOUS
  Administered 2020-04-01: 5 [IU] via SUBCUTANEOUS
  Administered 2020-04-01: 3 [IU] via SUBCUTANEOUS
  Administered 2020-04-02: 13:00:00 2 [IU] via SUBCUTANEOUS
  Administered 2020-04-02: 08:00:00 3 [IU] via SUBCUTANEOUS
  Filled 2020-03-29 (×11): qty 1

## 2020-03-29 MED ORDER — WARFARIN - PHARMACIST DOSING INPATIENT
Freq: Every day | Status: DC
  Filled 2020-03-29: qty 1

## 2020-03-29 MED ORDER — METOPROLOL TARTRATE 5 MG/5ML IV SOLN
2.5000 mg | Freq: Four times a day (QID) | INTRAVENOUS | Status: DC
  Administered 2020-03-29 – 2020-03-31 (×6): 2.5 mg via INTRAVENOUS
  Filled 2020-03-29 (×7): qty 5

## 2020-03-29 MED ORDER — PERFLUTREN LIPID MICROSPHERE
1.0000 mL | INTRAVENOUS | Status: AC | PRN
  Administered 2020-03-29: 09:00:00 2 mL via INTRAVENOUS
  Filled 2020-03-29: qty 10

## 2020-03-29 MED ORDER — LISINOPRIL 10 MG PO TABS
10.0000 mg | ORAL_TABLET | Freq: Every day | ORAL | Status: DC
  Administered 2020-03-31 – 2020-04-01 (×2): 10 mg via ORAL
  Filled 2020-03-29 (×3): qty 1

## 2020-03-29 MED ORDER — SENNOSIDES-DOCUSATE SODIUM 8.6-50 MG PO TABS: 1.0000 | ORAL_TABLET | Freq: Every evening | ORAL | Status: DC | PRN

## 2020-03-29 NOTE — ED Notes (Signed)
Pt returned from MRI; placed on cardiac monitor. Terri Piedra, RN notified.

## 2020-03-29 NOTE — Evaluation (Signed)
Clinical/Bedside Swallow Evaluation Patient Details  Name: Richard [name] MRN: [id number] Date of Birth: 1931-07-19  Today's Date: 04/08/2020 Time: SLP Start Time (ACUTE ONLY): 0950 SLP Stop Time (ACUTE ONLY): 1050 SLP Time Calculation (min) (ACUTE ONLY): 60 min  Past Medical History:  Past Medical History:  Diagnosis Date  . Atrial fibrillation ([place])   . Cancer ([place])   . Chronic kidney disease   . Colon polyp   . Diabetes mellitus   . History of blood clotting disorder   . HTN (hypertension)    Past Surgical History:  Past Surgical History:  Procedure Laterality Date  . AMPUTATION TOE Right 11/29/2018   Procedure: AMPUTATION TOE;  Surgeon: Albertine Patricia, DPM;  Location: [facility] ORS;  Service: Podiatry;  Laterality: Right;  . EYE SURGERY     CATARACTS  . HERNIA REPAIR    . LOWER EXTREMITY ANGIOGRAPHY Right 12/02/2018   Procedure: Lower Extremity Angiography;  Surgeon: Algernon Huxley, MD;  Location: [place] [facility];  Service: Cardiovascular;  Laterality: Right;  . SKIN CANCER EXCISION     HPI:  Pt is a 86 y.o. male with medical history significant for "3 CVAs" in past, per pt report, paroxysmal A. 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  EMS was called after 2 hours of symptom onset and patient reportedly refused to be taken to [facility] for the possibility of LVO, insisting on being brought to [place] [facility] instead.  ED course: By arrival to the hospital however, his symptoms had reportedly for the most part resolved with persistent weakness of right upper extremity  weakness beyond his usual baseline.  At baseline patient ambulates with a cane.  MRI: "Small acute perforator infarction at the left basal ganglia and corona radiata".    Assessment / Plan / Recommendation Clinical Impression  Patient comes from Home w/ full-time Caregiver (per pt report) and presented w/ right-sided facial droop with mild slurring of the speech with decrease strength/ROM in right upper extremity strength at the time of this evaluation. He was verbal w/ decreased volume of speech; mild Dysarthria. He was alert and able to verbally engage and follow basic instructions. Pt appears to present w/ oropharyngeal phase Dysphagia w/ overt clinical s/s of aspiration noted. Pt is at increased risk for aspiration and Pulmonary impact/decline from such at this time. Pt appears to have decreased insight into his deficits and potential impact of aspiration as he kept requesting "more of that" when given trials of thin liquids and puree(applesauce). Pt was positioned upright w/ support and given trials of ice chip, thin liquid via tsp, and trials of puree. Pt exhibited immediate Coughing w/ trials of thin liquid, ice chip, and applesauce w/out swallowing strategies implemented. Immediate cough was strong, however, f/u more Volitional coughing was strained and nonproductive for airway protection. Noted min increased WOB, effort requiring rest break to calm. Upon attempt and introduction of Head Turn Right and Look Down, pt appeared to adequately tolerate 2 tsps of puree w/out clinical s/s of aspiration. O2 sats remained in the mid90s. Laryngeal excursion appeared grossly WFL during swallow. Oral phase assessment  limited d/t min trials given but c/b grossly adequate bolus management of the trials given; oral clearing achieved w/ puree trials. Noted min increased drooling w/ ice chip trials - suspect reduced lingual sweeping and A-P transfer to attend and clear bolus residue. OM exam revealed R lateral lingual  and protrusion weakness; ROM WFL. Velum elevation equal. Labial/facial decreased tone noted w/ min Drooling d/t lingual labial decreased strength, tone, and awareness(CN VII and XII involvement). Pt required support w/ feeding d/t RUE weakness. Recommend NPO status at this time w/ f/u of objective swallow assessment, MBSS tomorrow(in light of the fact pt only admitted less than 12 hours ago w/ his symptoms). Recommend frequent oral care for hygiene and stimulation of swallowing. Recommend Meds given via alternative means at this time d/t risk for aspiration. ST services will f/u tomorrow w/ MBSS as planned. MD/NSG updated.  SLP Visit Diagnosis: Dysphagia, oropharyngeal phase (R13.12)    Aspiration Risk  Moderate aspiration risk;Severe aspiration risk;Risk for inadequate nutrition/hydration    Diet Recommendation  NPO status w/ frequent oral care for hygiene and stimulation of swallowing; aspiration precautions  Medication Administration: Via alternative means    Other  Recommendations Recommended Consults: (TBD) Oral Care Recommendations: Oral care QID;Staff/trained caregiver to provide oral care Other Recommendations: (TBD)   Follow up Recommendations (TBD)      Frequency and Duration min 3x week  2 weeks       Prognosis Prognosis for Safe Diet Advancement: Guarded Barriers to Reach Goals: Cognitive deficits;Time post onset;Severity of deficits;Behavior      Swallow Study   General Date of Onset: 03/31/2020 HPI: Pt is a 86 y.o. male with medical history significant for "3 CVAs" in past, per pt report, paroxysmal A. 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  EMS was called after 2 hours of symptom onset and patient reportedly refused to be taken to [facility] for the possibility of LVO, insisting on being brought to [place] [facility] instead.  ED course: By arrival to the hospital however, his symptoms had reportedly for the most part resolved with persistent weakness of right upper extremity weakness beyond his usual baseline.  At baseline patient ambulates with a cane.  MRI: "Small acute perforator infarction at the left basal ganglia and corona radiata".  Type of Study: Bedside Swallow Evaluation Previous Swallow Assessment: none noted in chart Diet Prior to this Study: NPO(regular diet at home per pt) Temperature Spikes Noted: No(wbc 14.3) Respiratory Status: Room air History of Recent Intubation: No Behavior/Cognition: Alert;Cooperative;Pleasant mood;Distractible;Requires cueing(min HOH) Oral Cavity Assessment: Excessive secretions(drooling) Oral Care Completed by SLP: Yes Oral Cavity - Dentition: Dentures, top;Dentures, bottom Vision: Functional for self-feeding Self-Feeding Abilities: Able to feed self;Needs assist;Needs set up(RUE weakness) Patient Positioning: Upright in bed(needed support) Baseline Vocal Quality: Low vocal intensity Volitional Cough: Weak Volitional Swallow: (effortful)    Oral/Motor/Sensory Function Overall Oral Motor/Sensory Function: Moderate impairment(drooling) Facial ROM: Reduced right Facial Symmetry: Abnormal symmetry right Facial Strength: Reduced right Lingual ROM: Within Functional Limits Lingual Symmetry: Abnormal symmetry right Lingual Strength: Reduced Velum: Within Functional Limits Mandible: Within Functional Limits   Ice Chips  Ice chips: Impaired Presentation: Spoon(2 trials) Oral Phase Impairments: Reduced labial seal;Reduced lingual movement/coordination;Poor awareness of bolus Oral Phase Functional Implications: Oral  residue Pharyngeal Phase Impairments: Cough - Immediate;Cough - Delayed;Suspected delayed Swallow;Multiple swallows   Thin Liquid Thin Liquid: Impaired Presentation: Spoon(1 trial) Oral Phase Impairments: Reduced labial seal;Reduced lingual movement/coordination;Poor awareness of bolus Oral Phase Functional Implications: Oral residue(R leakage) Pharyngeal  Phase Impairments: Cough - Immediate;Cough - Delayed;Suspected delayed Swallow    Nectar Thick Nectar Thick Liquid: Not tested   Honey Thick Honey Thick Liquid: Not tested   Puree Puree: Impaired Presentation: Spoon(fed; 3 trials) Oral Phase Impairments: Reduced labial seal;Reduced lingual movement/coordination;Poor awareness of bolus Oral Phase Functional Implications: Prolonged oral transit Pharyngeal Phase Impairments: Cough - Immediate;Cough - Delayed;Multiple swallows(x1/3 trials w/out strategies)   Solid     Solid: Not tested       Orinda Kenner, MS, CCC-SLP [place] [place] 04/06/2020,11:04 AM

## 2020-03-29 NOTE — Progress Notes (Signed)
PT Cancellation Note  Patient Details Name: Richard [name] MRN: [id number] DOB: 06/26/1931   Cancelled Treatment:    Reason Eval/Treat Not Completed: Medical issues which prohibited therapy(Per MRI report, finding suggestive of subacute hematoma. Neurology has not consulted at this time. Hospitalist askng to hold PT at this time until further workup established regarding stability of hematoma.)  2:51 PM, 04/01/2020 Etta Grandchild, PT, DPT Physical Therapist - [facility]  [phone] ([place])   [place] 03/25/2020, 2:50 PM

## 2020-03-29 NOTE — ED Notes (Signed)
Pt transported to mri 

## 2020-03-29 NOTE — ED Notes (Signed)
Pt had episode of urinary incontinent. Pt cleaned and bedding and clothing changed. Suction catheter placed on pt at this time

## 2020-03-29 NOTE — H&P (Signed)
History and Physical    Richard [name] MPN:[id number] DOB: [birth year] DOA: 03/30/2020  PCP: [name], MD   Patient coming from: Home I have personally briefly reviewed patient's old medical records in [place]  Chief Complaint: Right-sided weakness  HPI: Richard [name] is a 86 y.o. male with medical history significant for paroxysmal A. 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  EMS was called after 2 hours of symptom onset and patient reportedly refused to be taken to [facility] for the possibility of LVO, insisting on being brought to [place] [facility] instead.  ED course: By arrival to the hospital however, his symptoms had reportedly for the most part resolved with persistent weakness of right upper extremity weakness beyond his usual baseline.  At baseline patient ambulates with a cane. Vitals in the emergency room oral within normal limits.  Blood work mostly unremarkable.  Creatinine 2.24 which is his baseline for his CKD 4.  WBC 14.3.  CT head showed no acute disease.  TPA was contraindicated due to his chronic Coumadin therapy.  Because symptoms had for the most part resolved, teleneurology was not consulted.  Hospitalist consulted for admission.  At the time of my evaluation, patient had a right-sided facial droop with mild slurring of the speech, with preserved strength in the lower extremities and mild decrease in right upper extremity strength.  He continued to decline consideration of transfer.   Review of Systems: As per HPI otherwise 10 point review of systems  negative.    Past Medical History:  Diagnosis Date  . Atrial fibrillation ([place])   . Cancer ([place])   . Chronic kidney disease   . Colon polyp   . Diabetes mellitus   . History of blood clotting disorder   . HTN (hypertension)     Past Surgical History:  Procedure Laterality Date  . AMPUTATION TOE Right 11/29/2018   Procedure: AMPUTATION TOE;  Surgeon: Albertine Patricia, DPM;  Location: [facility] ORS;  Service: Podiatry;  Laterality: Right;  . EYE SURGERY     CATARACTS  . HERNIA REPAIR    . LOWER EXTREMITY ANGIOGRAPHY Right 12/02/2018   Procedure: Lower Extremity Angiography;  Surgeon: Algernon Huxley, MD;  Location: [place] [facility];  Service: Cardiovascular;  Laterality: Right;  . SKIN CANCER EXCISION       reports that he has never smoked. He has never used smokeless tobacco. He reports that he does not drink alcohol or use drugs.  No Known Allergies  No family history on file.   Prior to Admission medications   Medication Sig Start Date End Date Taking? Authorizing Provider  Alpha-Lipoic Acid 200 MG CAPS Take 1 capsule by mouth daily.     [provider]  ALPRAZolam Duanne Moron) 0.25 MG tablet Take 0.25 mg by mouth at bedtime as needed.      [provider]  aspirin 81 MG chewable tablet Chew 1 tablet (81 mg total) by mouth daily. 11/17/19   Lorella Nimrod, MD  atorvastatin (LIPITOR) 40 MG tablet Take 1 tablet (40 mg total) by mouth daily. 11/17/19   Amin,  Sumayya, MD  Calcium Carbonate (CALCIUM 600 PO) Take 1 tablet by mouth daily.     [provider]  Cholecalciferol (VITAMIN D) 2000 UNITS tablet Take 2,000 Units by mouth daily.      [provider]  Coenzyme Q10 10 MG capsule Take 10 mg by mouth.    [provider]  colchicine 0.6 MG tablet Take 0.6 mg by mouth.    [provider]  febuxostat (ULORIC) 40 MG tablet Take 1 tablet by mouth daily.     [provider]  glimepiride (AMARYL) 4 MG tablet Take 4 mg by mouth  daily with breakfast.  06/15/17   [provider]  lisinopril (ZESTRIL) 10 MG tablet Take 1 tablet (10 mg total) by mouth daily. 11/17/19   Lorella Nimrod, MD  metoprolol tartrate (LOPRESSOR) 25 MG tablet Take 0.5 tablets (12.5 mg total) by mouth 2 (two) times daily. 12/30/18   Demetrios Loll, MD  Multiple Vitamins-Minerals (MULTIVITAMIN ADULT EXTRA C PO) Take 1 tablet by mouth daily.     [provider]  nystatin-triamcinolone (MYCOLOG II) cream Apply 1 application topically 2 (two) times daily.  05/08/16   [provider]  Red Yeast Rice 600 MG TABS Take 1 tablet by mouth daily.     [provider]  senna-docusate (SENOKOT-S) 8.6-50 MG tablet Take 1 tablet by mouth 2 (two) times daily. 01/04/19   Gladstone Lighter, MD  Vitamins A & D (VITAMIN A & D) [address] UNITS TABS Take 1 tablet by mouth daily.     [provider]  warfarin (COUMADIN) 5 MG tablet Take 5 mg by mouth daily. 09/22/19   [provider]    Physical Exam: Vitals:   03/24/2020 0034 04/06/2020 0035 04/02/2020 0330  BP: (!) 147/92  (!) 137/59  Pulse: 65  69  Resp: 20    Temp: 98.1 F (36.7 C)    SpO2: 97%  98%  Weight:  99.8 kg   Height:  6' (1.829 m)      Vitals:   04/12/2020 0034 04/06/2020 0035 04/02/2020 0330  BP: (!) 147/92  (!) 137/59  Pulse: 65  69  Resp: 20    Temp: 98.1 F (36.7 C)    SpO2: 97%  98%  Weight:  99.8 kg   Height:  6' (1.829 m)     Constitutional: Alert and awake, oriented x3, not in any acute distress. Eyes: PERLA, EOMI, irises appear normal, anicteric sclera,  ENMT: external ears and nose appear normal, normal hearing  Neck: neck appears normal, no masses, normal ROM, no thyromegaly, no JVD  CVS: S1-S2 clear, no murmur rubs or gallops,  , no carotid bruits, pedal pulses palpable, No LE edema Respiratory:  clear to auscultation bilaterally, no wheezing, rales or rhonchi. Respiratory effort normal. No accessory muscle use.  Abdomen: soft nontender,  nondistended, normal bowel sounds, no hepatosplenomegaly, no hernias Musculoskeletal: : no cyanosis, clubbing , no contractures or atrophy Neuro: Right facial droop, slurred speech.  5/5 strength lower extremities, 4 out of 5 upper extremity Psych: judgement and insight appear normal, stable mood and affect,  Skin: no rashes or lesions or ulcers, no induration or nodules   Labs on Admission: I have personally reviewed following labs and imaging studies  CBC: Recent Labs  Lab 04/01/2020 0044  WBC 14.3*  NEUTROABS 9.0*  HGB 13.5  HCT 41.2  MCV 98.3  PLT 193   Basic Metabolic Panel: Recent Labs  Lab 03/19/2020 0044  NA 140  K 4.6  CL 109  CO2 22  GLUCOSE 158*  BUN 50*  CREATININE 2.24*  CALCIUM 8.4*   GFR: Estimated Creatinine Clearance: 29 mL/min (A) (by C-G formula based on SCr of 2.24 mg/dL (H)). Liver Function Tests: Recent Labs  Lab 03/27/2020 0044  AST 22  ALT 17  ALKPHOS 95  BILITOT 0.6  PROT 7.0  ALBUMIN 2.9*   No results for input(s): LIPASE, AMYLASE in the last 168 hours. No results for input(s): AMMONIA in the last 168 hours. Coagulation Profile: Recent Labs  Lab 04/13/2020 0142  INR 2.2*   Cardiac Enzymes: No results for input(s): CKTOTAL, CKMB, CKMBINDEX, TROPONINI in the last 168 hours. BNP (last 3 results) No results for input(s): PROBNP in the last 8760 hours. HbA1C: No results for input(s): HGBA1C in the last 72 hours. CBG: No results for input(s): GLUCAP in the last 168 hours. Lipid Profile: No results for input(s): CHOL, HDL, LDLCALC, TRIG, CHOLHDL, LDLDIRECT in the last 72 hours. Thyroid Function Tests: No results for input(s): TSH, T4TOTAL, FREET4, T3FREE, THYROIDAB in the last 72 hours. Anemia Panel: No results for input(s): VITAMINB12, FOLATE, FERRITIN, TIBC, IRON, RETICCTPCT in the last 72 hours. Urine analysis:    Component Value Date/Time   COLORURINE YELLOW (A) 02/29/2020 0244   APPEARANCEUR TURBID (A) 02/29/2020 0244    APPEARANCEUR Cloudy (A) 06/01/2019 1533   LABSPEC 1.014 02/29/2020 0244   PHURINE 8.0 02/29/2020 0244   GLUCOSEU NEGATIVE 02/29/2020 0244   HGBUR SMALL (A) 02/29/2020 0244   BILIRUBINUR NEGATIVE 02/29/2020 0244   BILIRUBINUR Negative 06/01/2019 [place] NEGATIVE 02/29/2020 0244   PROTEINUR 100 (A) 02/29/2020 0244   NITRITE NEGATIVE 02/29/2020 0244   LEUKOCYTESUR MODERATE (A) 02/29/2020 0244    Radiological Exams on Admission: CT HEAD WO CONTRAST  Result Date: 03/21/2020 CLINICAL DATA:  Right-sided weakness with facial droop EXAM: CT HEAD WITHOUT CONTRAST TECHNIQUE: Contiguous axial images were obtained from the base of the skull through the vertex without intravenous contrast. COMPARISON:  MRI 11/12/2019, CT brain 11/12/2019 FINDINGS: Brain: No acute territorial infarction, hemorrhage or intracranial mass. Mild atrophy and chronic small vessel ischemic change of the white matter. Stable ventricle size. Vascular: No hyperdense vessels.  Carotid vascular calcification. Skull: Normal. Negative for fracture or focal lesion. Sinuses/Orbits: No acute finding. Other: None IMPRESSION: 1. No CT evidence for acute intracranial abnormality. 2. Atrophy and chronic small vessel ischemic change of the white matter Electronically Signed   By: Donavan Foil M.D.   On: 03/25/2020 01:44    EKG: Independently reviewed.   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  -patient is Declining consideration of transfer if a candidate for thrombectomy. States he does not want to go to any other hospital even if it means his stroke symptoms can be reversed. -Continue  Coumadin and statins -Permissive hypertension for 48 hours -Cardiac monitoring, echocardiogram, MRI -Neurology consult requested -PT OT and speech therapy evaluation    PAF (paroxysmal atrial fibrillation) (HCC) -Continue Coumadin and metoprolol    HTN (hypertension) -Continue lisinopril    CKD (chronic kidney disease) stage 4, GFR 15-29 ml/min (HCC) -Renal function at baseline      Type 2 diabetes mellitus with diabetic peripheral angiopathy  without gangrene (HCC) -Sliding scale insulin coverage    Neurogenic bladder -Can continue self-catheterization. Place Foley catheters unavailable   Chronic anticoagulation History of PE History of factor V Leiden deficiency -Pharmacy consult for Coumadin    DVT prophylaxis: coumadin  Code Status: DNR  Family Communication:  none  Disposition Plan: Back to previous home environment Consults called: neurology Status:obs    Athena Masse MD [facility]     03/31/2020, 3:56 AM

## 2020-03-29 NOTE — Progress Notes (Signed)
ANTICOAGULATION CONSULT NOTE - Follow Up Consult  Pharmacy Consult for Warfarin Indication: atrial fibrillation and VTE treatment  No Known Allergies  Patient Measurements: Height: 6' (182.9 cm) Weight: 99.8 kg (220 lb) IBW/kg (Calculated) : 77.6 Heparin Dosing Weight:    Vital Signs: Temp: 98.1 F (36.7 C) (04/14 0034) BP: 132/57 (04/14 0930) Pulse Rate: 56 (04/14 0930)  Labs: Recent Labs    04/12/2020 0044 04/05/2020 0142  HGB 13.5  --   HCT 41.2  --   PLT 221  --   APTT  --  40*  LABPROT  --  24.5*  INR  --  2.2*  CREATININE 2.24*  --     Estimated Creatinine Clearance: 29 mL/min (A) (by C-G formula based on SCr of 2.24 mg/dL (H)).  Assessment: Patient is a 86yo male admitted for CVA. Patient has a h/o Factor V Leiden deficiency, PE, afib. Patient was taking Warfarin prior to admission. Pharmacy is consulted to manage Warfarin therapy.  Home Dose: Warfarin 5mg  daily  Date      INR     Dose 4/14        2.2  Goal of Therapy:  INR 2-3 Monitor platelets by anticoagulation protocol: Yes   Plan:  Patient is therapeutic on current regimen. Will continue home dose of Warfarin 5mg  daily. Daily INR checks.  Paulina Fusi, PharmD, BCPS 04/14/2020 9:47 AM

## 2020-03-29 NOTE — ED Notes (Signed)
Assumed care of patient. Patient sleeping comfortably. Denies pain issues or concerns when awakened from sleep. Patient was easily aroused when called by name. Patient sinus brady but has been in that rhythm asymptomatically since arrival. otherwise normal vitals signs noted. Normal saline infusing at 100cc/hr via 22g in right hand. Area C/D/I. Patient on room air sating 98% with clear lung fields. Awaiting bed status. Will continue to monitor. Safety maintained, call light w/i reach.

## 2020-03-29 NOTE — Progress Notes (Signed)
OT Cancellation Note  Patient Details Name: Richard [name] MRN: [id number] DOB: January 16, 1931   Cancelled Treatment:    Reason Eval/Treat Not Completed: Medical issues which prohibited therapy;Other (comment). Thank you for the OT consult. Order received and chart reviewed. Per MRI report, finding suggestive of subacute hematoma. Neurology has not consulted at this time. Hospitalist askng to hold therapy at this time until further workup established regarding stability of hematoma. Will continue to follow remotely.   Shara Blazing, M.S., OTR/L Ascom: [phone] 03/28/2020, 3:06 PM

## 2020-03-29 NOTE — Progress Notes (Signed)
ANTICOAGULATION CONSULT NOTE - Initial Consult  Pharmacy Consult for Warfarin Indication: VTE treatment  No Known Allergies  Patient Measurements: Height: 6' (182.9 cm) Weight: 99.8 kg (220 lb) IBW/kg (Calculated) : 77.6  Vital Signs: Temp: 98.1 F (36.7 C) (04/14 0034) BP: 137/59 (04/14 0330) Pulse Rate: 69 (04/14 0330)  Labs: Recent Labs    03/24/2020 0044 04/06/2020 0142  HGB 13.5  --   HCT 41.2  --   PLT 221  --   APTT  --  40*  LABPROT  --  24.5*  INR  --  2.2*  CREATININE 2.24*  --    Estimated Creatinine Clearance: 29 mL/min (A) (by C-G formula based on SCr of 2.24 mg/dL (H)).  Medical History: Past Medical History:  Diagnosis Date  . Atrial fibrillation ([place])   . Cancer ([place])   . Chronic kidney disease   . Colon polyp   . Diabetes mellitus   . History of blood clotting disorder   . HTN (hypertension)    Medications:  (Not in a hospital admission)   Assessment: Pt to ED via EMS from home. Pt arrives c/o right sided weakness, facial drop and slurred speech that started around 2230 tonight. Pts symptoms have since resolved. Pt has hx of stroke. Pt denies any pain. Pt taking warfarin.  INR is therapeutic on admission.  Goal of Therapy:  INR 2-3 Monitor platelets by anticoagulation protocol: Yes   Plan:  No additional warfarin at this time F/U for warfarin dose today at scheduled time  Ena Dawley 04/10/2020,4:10 AM

## 2020-03-29 NOTE — Progress Notes (Signed)
*  PRELIMINARY RESULTS* Echocardiogram 2D Echocardiogram has been performed.  Richard [name] 04/04/2020, 9:04 AM

## 2020-03-29 NOTE — Progress Notes (Signed)
SLP Cancellation Note  Patient Details Name: Richard [name] MRN: [id number] DOB: 1931-12-12   Cancelled treatment:       Reason Eval/Treat Not Completed: (chart reviewed). Will hold on Cog-linguistic evaluation once pt has been admitted and transferred to a pt room. Currently, pt is verbally engaging w/ adequate abilities to makes wants/needs known; Dysarthria noted d/t R orofacial weakness. Dysphagia being addressed currently. ST services will f/u tomorrow. NSG/MD agreed.    Orinda Kenner, MS, CCC-SLP Kalen Neidert 04/09/2020, 11:07 AM

## 2020-03-29 NOTE — Consult Note (Signed)
Requesting Physician: Mal Misty    Chief Complaint: Right sided weakness  I have been asked by Dr. Mal Misty to see this patient in consultation for stroke.  HPI: Richard [name] is an 86 y.o. male with medical history significant for paroxysmal A. 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  Initial NIHSS of 2.  Date last known well: 03/28/2020 Time last known well: Time: 22:30 tPA Given: No: Improvement in symptoms  Past Medical History:  Diagnosis Date  . Atrial fibrillation ([place])   . Cancer ([place])   . Chronic kidney disease   . Colon polyp   . Diabetes mellitus   . History of blood clotting disorder   . HTN (hypertension)     Past Surgical History:  Procedure Laterality Date  . AMPUTATION TOE Right 11/29/2018   Procedure: AMPUTATION TOE;  Surgeon: Albertine Patricia, DPM;  Location: [facility] ORS;  Service: Podiatry;  Laterality: Right;  . EYE SURGERY     CATARACTS  . HERNIA REPAIR    . LOWER EXTREMITY ANGIOGRAPHY Right 12/02/2018   Procedure: Lower Extremity Angiography;  Surgeon: Algernon Huxley, MD;  Location: [facility];  Service: Cardiovascular;  Laterality: Right;  . SKIN CANCER EXCISION      History reviewed. No pertinent family history. Social History:  reports that he has never smoked. He has never used smokeless tobacco. He reports that he does not drink alcohol or use drugs.  Allergies: No Known Allergies  Medications: I have reviewed the patient's current medications. Prior to Admission:  Prior to Admission medications   Medication Sig Start Date End Date Taking? Authorizing Provider  Alpha-Lipoic Acid 200 MG CAPS Take 1 capsule by mouth daily.    Yes [provider]  ALPRAZolam (XANAX) 0.25 MG tablet Take 0.25 mg by mouth at bedtime as needed.     Yes [provider]  aspirin 81 MG chewable tablet Chew 1 tablet (81 mg total) by mouth daily. 11/17/19  Yes Lorella Nimrod, MD  atorvastatin (LIPITOR) 40 MG tablet Take 1 tablet (40 mg total) by mouth daily. 11/17/19  Yes Lorella Nimrod, MD  Calcium Carbonate (CALCIUM 600 PO) Take 1 tablet by mouth daily.    Yes [provider]  Cholecalciferol (VITAMIN D) 2000 UNITS tablet Take 2,000 Units by mouth daily.     Yes [provider]  Coenzyme Q10 10 MG capsule Take 10 mg by mouth.   Yes [provider]  colchicine 0.6 MG tablet Take 0.6 mg by mouth.   Yes [provider]  febuxostat (ULORIC) 40 MG tablet Take 1 tablet by mouth daily.    Yes [provider]  glimepiride (AMARYL) 4 MG tablet Take 4 mg by mouth daily with breakfast.  06/15/17  Yes [provider]  lisinopril (ZESTRIL) 10 MG tablet Take 1 tablet (10 mg total) by mouth daily. 11/17/19  Yes Lorella Nimrod, MD  metoprolol tartrate (LOPRESSOR) 25 MG tablet Take 0.5 tablets (12.5 mg total) by mouth 2 (two) times daily. 12/30/18  Yes Demetrios Loll, MD  Multiple Vitamins-Minerals (MULTIVITAMIN ADULT EXTRA C PO) Take 1 tablet by mouth daily.    Yes [provider]  nystatin-triamcinolone (MYCOLOG II) cream Apply 1 application topically 2 (two) times  daily.  05/08/16  Yes [provider]  Red Yeast Rice 600 MG TABS Take 1 tablet by mouth daily.    Yes [provider]  senna-docusate (SENOKOT-S) 8.6-50 MG tablet Take 1 tablet by mouth 2 (two) times daily. 01/04/19  Yes Gladstone Lighter, MD  Vitamins A & D (VITAMIN A & D) [address] UNITS TABS Take 1 tablet by mouth daily.    Yes [provider]  warfarin (COUMADIN) 5 MG tablet Take 5 mg by mouth daily. 09/22/19  Yes [provider]    ROS: History obtained from the patient  General ROS: negative for -  chills, fatigue, fever, night sweats, weight gain or weight loss Psychological ROS: negative for - behavioral disorder, hallucinations, memory difficulties, mood swings or suicidal ideation Ophthalmic ROS: negative for - blurry vision, double vision, eye pain or loss of vision ENT ROS: difficulty swallowing Allergy and Immunology ROS: negative for - hives or itchy/watery eyes Hematological and Lymphatic ROS: negative for - bleeding problems, bruising or swollen lymph nodes Endocrine ROS: negative for - galactorrhea, hair pattern changes, polydipsia/polyuria or temperature intolerance Respiratory ROS: negative for - cough, hemoptysis, shortness of breath or wheezing Cardiovascular ROS: negative for - chest pain, dyspnea on exertion, edema or irregular heartbeat Gastrointestinal ROS: negative for - abdominal pain, diarrhea, hematemesis, nausea/vomiting or stool incontinence Genito-Urinary ROS: incontinence Musculoskeletal ROS: LE edema Neurological ROS: as noted in HPI Dermatological ROS: negative for rash and skin lesion changes  Physical Examination: Blood pressure (!) 132/57, pulse (!) 56, temperature 98.1 F (36.7 C), resp. rate 19, height 6' (1.829 m), weight 99.8 kg, SpO2 96 %.  HEENT-  Normocephalic, no lesions, without obvious abnormality.  Normal external eye and conjunctiva.  Normal TM's bilaterally.  Normal auditory canals and external ears. Normal external nose, mucus membranes and septum.  Normal pharynx. Cardiovascular- S1, S2 normal, pulses palpable throughout   Lungs- chest clear, no wheezing, rales, normal symmetric air entry Abdomen- soft, non-tender; bowel sounds normal; no masses,  no organomegaly Extremities- BLE edema Lymph-no adenopathy palpable Musculoskeletal-no joint tenderness, deformity or swelling Skin-warm and dry, no hyperpigmentation, vitiligo, or suspicious lesions  Neurological Examination   Mental Status: Alert, oriented, thought content appropriate.   Speech fluent but dysarthric.  Able to follow commands without difficulty. Cranial Nerves: II: Discs flat bilaterally; Visual fields grossly normal, pupils equal, round, reactive to light and accommodation III,IV, VI: ptosis not present, extra-ocular motions intact bilaterally V,VII: right facial droop, facial light touch sensation normal bilaterally VIII: hearing normal bilaterally IX,X: gag reflex present XI: bilateral shoulder shrug XII: midline tongue extension Motor: Right : Upper extremity   4/5    Left:     Upper extremity   5/5  Lower extremity   3-/5     Lower extremity   3/5 Tone and bulk:normal tone throughout; no atrophy noted Sensory: Pinprick and light touch intact throughout, bilaterally Deep Tendon Reflexes: Symmetric throughout Plantars: Right: toe amputation   Left: mute Cerebellar: Normal finger-to-nose testing Gait: not tested due to safety concerns    Laboratory Studies:  Basic Metabolic Panel: Recent Labs  Lab 04/01/2020 0044  NA 140  K 4.6  CL 109  CO2 22  GLUCOSE 158*  BUN 50*  CREATININE 2.24*  CALCIUM 8.4*    Liver Function Tests: Recent Labs  Lab 04/12/2020 0044  AST 22  ALT 17  ALKPHOS 95  BILITOT 0.6  PROT 7.0  ALBUMIN 2.9*   No results for input(s): LIPASE, AMYLASE in the last  168 hours. No results for input(s): AMMONIA in the last 168 hours.  CBC: Recent Labs  Lab 03/30/2020 0044  WBC 14.3*  NEUTROABS 9.0*  HGB 13.5  HCT 41.2  MCV 98.3  PLT 221    Cardiac Enzymes: No results for input(s): CKTOTAL, CKMB, CKMBINDEX, TROPONINI in the last 168 hours.  BNP: Invalid input(s): POCBNP  CBG: Recent Labs  Lab 04/05/2020 0803  GLUCAP 113*    Microbiology: Results for orders placed or performed during the hospital encounter of 03/20/2020  Respiratory Panel by RT PCR (Flu A&B, Covid) - Nasopharyngeal Swab     Status: None   Collection Time: 04/11/2020 12:52 AM   Specimen: Nasopharyngeal Swab  Result Value Ref Range Status   SARS  Coronavirus 2 by RT PCR NEGATIVE NEGATIVE Final    Comment: (NOTE) SARS-CoV-2 target nucleic acids are NOT DETECTED. The SARS-CoV-2 RNA is generally detectable in upper respiratoy specimens during the acute phase of infection. The lowest concentration of SARS-CoV-2 viral copies this assay can detect is 131 copies/mL. A negative result does not preclude SARS-Cov-2 infection and should not be used as the sole basis for treatment or other patient management decisions. A negative result may occur with  improper specimen collection/handling, submission of specimen other than nasopharyngeal swab, presence of viral mutation(s) within the areas targeted by this assay, and inadequate number of viral copies (<131 copies/mL). A negative result must be combined with clinical observations, patient history, and epidemiological information. The expected result is Negative. Fact Sheet for Patients:  PinkCheek.be Fact Sheet for Healthcare Providers:  GravelBags.it This test is not yet ap proved or cleared by the [place] FDA and  has been authorized for detection and/or diagnosis of SARS-CoV-2 by FDA under an Emergency Use Authorization (EUA). This EUA will remain  in effect (meaning this test can be used) for the duration of the COVID-19 declaration under Section 564(b)(1) of the Act, 21 U.S.C. section 360bbb-3(b)(1), unless the authorization is terminated or revoked sooner.    Influenza A by PCR NEGATIVE NEGATIVE Final   Influenza B by PCR NEGATIVE NEGATIVE Final    Comment: (NOTE) The Xpert Xpress SARS-CoV-2/FLU/RSV assay is intended as an aid in  the diagnosis of influenza from Nasopharyngeal swab specimens and  should not be used as a sole basis for treatment. Nasal washings and  aspirates are unacceptable for Xpert Xpress SARS-CoV-2/FLU/RSV  testing. Fact Sheet for Patients: PinkCheek.be Fact Sheet  for Healthcare Providers: GravelBags.it This test is not yet approved or cleared by the [place] FDA and  has been authorized for detection and/or diagnosis of SARS-CoV-2 by  FDA under an Emergency Use Authorization (EUA). This EUA will remain  in effect (meaning this test can be used) for the duration of the  Covid-19 declaration under Section 564(b)(1) of the Act, 21  U.S.C. section 360bbb-3(b)(1), unless the authorization is  terminated or revoked. Performed at [facility], [address] [place], [place] [address]     Coagulation Studies: Recent Labs    03/24/2020 0142  LABPROT 24.5*  INR 2.2*    Urinalysis: No results for input(s): COLORURINE, LABSPEC, PHURINE, GLUCOSEU, HGBUR, BILIRUBINUR, KETONESUR, PROTEINUR, UROBILINOGEN, NITRITE, LEUKOCYTESUR in the last 168 hours.  Invalid input(s): APPERANCEUR  Lipid Panel:    Component Value Date/Time   CHOL 128 03/25/2020 0044   TRIG 85 04/11/2020 0044   HDL 36 (L) 03/28/2020 0044   CHOLHDL 3.6 04/13/2020 0044   VLDL 17 03/25/2020 0044   LDLCALC 75 03/28/2020 0044  HgbA1C:  Lab Results  Component Value Date   HGBA1C 7.0 (H) 11/13/2019    Urine Drug Screen:  No results found for: LABOPIA, [place], [place], AMPHETMU, THCU, LABBARB  Alcohol Level:  Recent Labs  Lab 03/21/2020 [place] <10    Other results: EKG: atrial fibrillation, rate 56 bpm.  Imaging: CT HEAD WO CONTRAST  Result Date: 03/27/2020 CLINICAL DATA:  Right-sided weakness with facial droop EXAM: CT HEAD WITHOUT CONTRAST TECHNIQUE: Contiguous axial images were obtained from the base of the skull through the vertex without intravenous contrast. COMPARISON:  MRI 11/12/2019, CT brain 11/12/2019 FINDINGS: Brain: No acute territorial infarction, hemorrhage or intracranial mass. Mild atrophy and chronic small vessel ischemic change of the white matter. Stable ventricle size. Vascular: No hyperdense vessels.   Carotid vascular calcification. Skull: Normal. Negative for fracture or focal lesion. Sinuses/Orbits: No acute finding. Other: None IMPRESSION: 1. No CT evidence for acute intracranial abnormality. 2. Atrophy and chronic small vessel ischemic change of the white matter Electronically Signed   By: Donavan Foil M.D.   On: 03/28/2020 01:44   MR BRAIN WO CONTRAST  Result Date: 04/09/2020 CLINICAL DATA:  Acute onset of right-sided weakness with facial droop that has nearly completely resolved EXAM: MRI HEAD WITHOUT CONTRAST TECHNIQUE: Multiplanar, multiecho pulse sequences of the brain and surrounding structures were obtained without intravenous contrast. COMPARISON:  Head CT from earlier today. FINDINGS: Brain: Elongated patchy restricted diffusion at the left putamen and corona radiata. Chronic blood products along the right cerebral convexity. There is subtle subdural collection/dural thickening along the right frontal parietal convexity which is not dense by CT, 2 mm in maximal thickness. This does not follow CSF on FLAIR imaging. No hydrocephalus or masslike finding. Vascular: Preserved flow voids. Intracranial vessels are tortuous, often attributed to chronic hypertension. Skull and upper cervical spine: Normal marrow signal. Degenerative facet spurring in the upper cervical spine. Sinuses/Orbits: Partial bilateral mastoid opacification. Mucosal thickening in the left sphenoid sinus. Bilateral cataract resection. Other: 3.7 cm suboccipital lipoma in the midline posterior neck. IMPRESSION: 1. Small acute perforator infarction at the left basal ganglia and corona radiata. 2. Thin (2 mm) subdural collection or dural thickening along the right frontal parietal convexity, likely subacute hematoma. Electronically Signed   By: Monte Fantasia M.D.   On: 04/08/2020 06:21   ECHOCARDIOGRAM COMPLETE  Result Date: 03/25/2020    ECHOCARDIOGRAM REPORT   Patient Name:   Richard [name] Date of Exam: 03/18/2020 Medical  Rec #:  [id number]        Height:       72.0 in Accession #:    [id number]       Weight:       220.0 lb Date of Birth:  May 08, 1931        BSA:          2.219 m Patient Age:    61 years         BP:           106/84 mmHg Patient Gender: M                HR:           58 bpm. Exam Location:  [facility] Procedure: 2D Echo, Color Doppler, Cardiac Doppler and Intracardiac            Opacification Agent Indications:     I163.9 Stroke  History:         Patient has prior history of Echocardiogram examinations, most  recent 11/13/2019. CKD, Arrythmias:Atrial Fibrillation; Risk                  Factors:Hypertension and Diabetes. Cancer.  Sonographer:     Charmayne Sheer RDCS (AE) Referring Phys:  [id number] Athena Masse Diagnosing Phys: Bartholome Bill MD  Sonographer Comments: Suboptimal apical window and suboptimal subcostal window. IMPRESSIONS  1. Left ventricular ejection fraction, by estimation, is 60 to 65%. The left ventricle has normal function. The left ventricle has no regional wall motion abnormalities. There is moderate left ventricular hypertrophy. Left ventricular diastolic parameters are consistent with Grade I diastolic dysfunction (impaired relaxation).  2. Right ventricular systolic function is normal. The right ventricular size is normal.  3. Left atrial size was mildly dilated.  4. The mitral valve is degenerative. Trivial mitral valve regurgitation.  5. The aortic valve was not well visualized. Aortic valve regurgitation is mild to moderate. FINDINGS  Left Ventricle: Left ventricular ejection fraction, by estimation, is 60 to 65%. The left ventricle has normal function. The left ventricle has no regional wall motion abnormalities. Definity contrast agent was given IV to delineate the left ventricular  endocardial borders. The left ventricular internal cavity size was normal in size. There is moderate left ventricular hypertrophy. Left ventricular diastolic parameters are consistent with Grade I diastolic  dysfunction (impaired relaxation). Right Ventricle: The right ventricular size is normal. No increase in right ventricular wall thickness. Right ventricular systolic function is normal. Left Atrium: Left atrial size was mildly dilated. Right Atrium: Right atrial size was normal in size. Pericardium: There is no evidence of pericardial effusion. Mitral Valve: The mitral valve is degenerative in appearance. Trivial mitral valve regurgitation. MV peak gradient, 3.3 mmHg. The mean mitral valve gradient is 2.0 mmHg. Tricuspid Valve: The tricuspid valve is not well visualized. Tricuspid valve regurgitation is trivial. Aortic Valve: The aortic valve was not well visualized. Aortic valve regurgitation is mild to moderate. Aortic valve mean gradient measures 3.0 mmHg. Aortic valve peak gradient measures 6.0 mmHg. Aortic valve area, by VTI measures 2.13 cm. Pulmonic Valve: The pulmonic valve was not well visualized. Pulmonic valve regurgitation is trivial. Aorta: The aortic root was not well visualized. IAS/Shunts: The interatrial septum was not assessed.  LEFT VENTRICLE PLAX 2D LVIDd:         4.27 cm LVIDs:         2.84 cm LV PW:         1.18 cm LV IVS:        0.89 cm LVOT diam:     2.40 cm LV SV:         60 LV SV Index:   27 LVOT Area:     4.52 cm  LEFT ATRIUM           Index LA diam:      4.40 cm 1.98 cm/m LA Vol (A4C): 62.4 ml 28.13 ml/m  AORTIC VALVE                   PULMONIC VALVE AV Area (Vmax):    2.37 cm    PV Vmax:       0.83 m/s AV Area (Vmean):   2.48 cm    PV Vmean:      50.800 cm/s AV Area (VTI):     2.13 cm    PV VTI:        0.181 m AV Vmax:           122.00 cm/s PV Peak grad:  2.7 mmHg AV Vmean:          78.900 cm/s PV Mean grad:  1.0 mmHg AV VTI:            0.282 m AV Peak Grad:      6.0 mmHg AV Mean Grad:      3.0 mmHg LVOT Vmax:         64.00 cm/s LVOT Vmean:        43.300 cm/s LVOT VTI:          0.133 m LVOT/AV VTI ratio: 0.47  AORTA Ao Root diam: 3.90 cm MITRAL VALVE MV Area (PHT): 2.53 cm     SHUNTS MV Peak grad:  3.3 mmHg    Systemic VTI:  0.13 m MV Mean grad:  2.0 mmHg    Systemic Diam: 2.40 cm MV Vmax:       0.90 m/s MV Vmean:      57.3 cm/s MV Decel Time: 300 msec MV E velocity: 73.87 cm/s Bartholome Bill MD Electronically signed by Bartholome Bill MD Signature Date/Time: 04/06/2020/10:50:12 AM    Final     Assessment: 86 y.o. male with medical history significant for paroxysmal A. 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  Per patient has had some improvement since onset of symptoms but has not returned to baseline.  MRI of the brain personally reviewed and shows an acute left basal ganglia infarct with possible small right, subacute SDH.  Although location more typical for a small vessel etiology, due to history of afib and hypercoagulable syndrome embolic etiology is more likely.  Patient on Coumadin with therapeutic INR. Carotid dopplers pending.  Echocardiogram shows no cardiac source of emboli with an EF of 60-65%.  A1c pending, LDL 75.   Stroke Risk Factors - atrial fibrillation, diabetes mellitus, hypercoagulable state and hypertension  Plan: 1. HgbA1c pending with target A1c<7.0 2. PT consult, OT consult, Speech consult 3. Carotid dopplers pending 4. Prophylactic therapy-Continue Coumadin 5. NPO until RN stroke swallow screen 6. Telemetry monitoring 7. Frequent neuro checks 8. Aggressive lipid management with target LDL<70. 9. Repeat head CT in AM to follow ?SDH.  If neurological worsening prior to tomorrow would obtain head CT stat.     Alexis Goodell, MD Neurology [phone] 03/22/2020, 10:54 AM

## 2020-03-29 NOTE — Progress Notes (Addendum)
He complains of weakness in his right upper extremity.  He did not pass his bedside swallow screen according to his nurse.  Physical exam is significant for slurred speech, right facial droop and right upper extremity weakness (power 3/5).  Diagnostic data has been reviewed.  Patient has been evaluated by the neurologist.  NPO for now because of dysphagia.  Follow-up with speech therapist for further recommendations.  Repeat CT head tomorrow for evaluation of ? subdural hematoma noted on MRI.  Continue current treatment.

## 2020-03-29 NOTE — ED Notes (Signed)
Pt had BM. Brief changed and pt cleaned up. Small amount of skin breakdown noted to sacral area. Pt urine bag changed.

## 2020-03-29 NOTE — ED Triage Notes (Addendum)
Pt to ED via EMS from home. Pt arrives c/o right sided weakness, facial drop and slurred speech that started around 2230 tonight. Pts symptoms have since resolved. Pt has hx of stroke. Pt denies any pain. Pt taking warfarin

## 2020-03-29 NOTE — ED Provider Notes (Signed)
[facility] Emergency Department Provider Note  ____________________________________________   First MD Initiated Contact with Patient 04/03/[id number]     (approximate)  I have reviewed the triage vital signs and the nursing notes.   HISTORY  Chief Complaint Weakness    HPI Richard [name] is a 86 y.o. male with medical history as listed below which notably includes A. fib on warfarin chronically and a history of multiple prior CVAs which is resulted in some chronic deficits of his right arm.  He walks with a walker but is able to ambulate.  He presents tonight for acute onset right-sided weakness and facial droop which has since resolved almost completely except for some residual weakness in his right leg.  The patient is alert and oriented.  He reports that he got up to go to the bathroom during the night last night (nearly 24 hours ago) and reports that he passed out on the way back to his bedroom.  He denies hurting himself and says he has no headache or neck pain.  He said he spent most of the day today in bed, not because he felt bad but because he was "being lazy".  Tonight at about 8:00p - 8:30p, a male companion who is with him reports that he had acute onset of severe right-sided deficits including right-sided facial droop, drooling, slurred speech, inability to raise right arm, and inability to move right leg.  The symptoms persisted for more than 2 hours and so she called EMS.  When EMS arrived they said that his symptoms were profound and suggestive of LVO, so they attempted to take him to [facility] for possible intervention.  However when he realized they were going to [facility], he refused transport and started to get upset and absolutely refused to be taken anywhere about the [place] [facility].    By the time they arrived at [facility], the patient's symptoms have almost completely resolved.  His speech is clear, his facial droop is gone, and he is able to  move his right arm back to his baseline.  He states, though, that his right leg feels weaker than usual and he is not sure he could walk on it.  He denies fever, headache, neck pain, chest pain, shortness of breath, nausea, vomiting, and abdominal pain.  He has no pain in his extremities.         Past Medical History:  Diagnosis Date  . Atrial fibrillation ([place])   . Cancer ([place])   . Chronic kidney disease   . Colon polyp   . Diabetes mellitus   . History of blood clotting disorder   . HTN (hypertension)     Patient Active Problem List   Diagnosis Date Noted  . Stroke (cerebrum) ([place]) 11/13/2019  . Acute ischemic stroke ([place])   . Urinary tract infection without hematuria   . AKI (acute kidney injury) ([place])   . Altered mental status 11/12/2019  . Subdural hematoma ([place]) 12/28/2018  . PAF (paroxysmal atrial fibrillation) ([place]) 12/28/2018  . Diabetes ([place]) 12/28/2018  . CKD (chronic kidney disease), stage III 12/28/2018  . HTN (hypertension) 12/28/2018  . Pressure injury of skin 11/30/2018  . Acute osteomyelitis of toe of right foot ([place]) 11/28/2018  . Malignant tumor of prostate ([place]) 10/26/2015  . Urinary tract infection with hematuria 01/11/2015  . Bladder neck obstruction 11/04/2012  . 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 Prostate cancer ([place]) 11/04/2012  . Retention of urine 05/06/2012  . History of  hematuria 01/30/2012  . Malignant neoplasm of prostate ([place]) 09/30/2011  . Gross hematuria 09/19/2011  . Benign prostatic hyperplasia with urinary retention 09/10/2011  . Elevated prostate specific antigen (PSA) 09/10/2011    Past Surgical History:  Procedure Laterality Date  . AMPUTATION TOE Right 11/29/2018   Procedure: AMPUTATION TOE;  Surgeon: Albertine Patricia, DPM;  Location: [facility] ORS;  Service: Podiatry;  Laterality: Right;  . EYE SURGERY     CATARACTS  . HERNIA REPAIR    . LOWER EXTREMITY ANGIOGRAPHY Right 12/02/2018   Procedure: Lower Extremity Angiography;   Surgeon: Algernon Huxley, MD;  Location: [facility];  Service: Cardiovascular;  Laterality: Right;  . SKIN CANCER EXCISION      Prior to Admission medications   Medication Sig Start Date End Date Taking? Authorizing Provider  Alpha-Lipoic Acid 200 MG CAPS Take 1 capsule by mouth daily.     [provider]  ALPRAZolam Duanne Moron) 0.25 MG tablet Take 0.25 mg by mouth at bedtime as needed.      [provider]  aspirin 81 MG chewable tablet Chew 1 tablet (81 mg total) by mouth daily. 11/17/19   Lorella Nimrod, MD  atorvastatin (LIPITOR) 40 MG tablet Take 1 tablet (40 mg total) by mouth daily. 11/17/19   Lorella Nimrod, MD  Calcium Carbonate (CALCIUM 600 PO) Take 1 tablet by mouth daily.     [provider]  Cholecalciferol (VITAMIN D) 2000 UNITS tablet Take 2,000 Units by mouth daily.      [provider]  Coenzyme Q10 10 MG capsule Take 10 mg by mouth.    [provider]  colchicine 0.6 MG tablet Take 0.6 mg by mouth.    [provider]  febuxostat (ULORIC) 40 MG tablet Take 1 tablet by mouth daily.     [provider]  glimepiride (AMARYL) 4 MG tablet Take 4 mg by mouth daily with breakfast.  06/15/17   [provider]  lisinopril (ZESTRIL) 10 MG tablet Take 1 tablet (10 mg total) by mouth daily. 11/17/19   Lorella Nimrod, MD  metoprolol tartrate (LOPRESSOR) 25 MG tablet Take 0.5 tablets (12.5 mg total) by mouth 2 (two) times daily. 12/30/18   Demetrios Loll, MD  Multiple Vitamins-Minerals (MULTIVITAMIN ADULT EXTRA C PO) Take 1 tablet by mouth daily.     [provider]  nystatin-triamcinolone (MYCOLOG II) cream Apply 1 application topically 2 (two) times daily.  05/08/16   [provider]  Red Yeast Rice 600 MG TABS Take 1 tablet by mouth daily.     [provider]  senna-docusate (SENOKOT-S) 8.6-50 MG tablet Take 1 tablet by mouth 2 (two) times daily. 01/04/19   Gladstone Lighter, MD  Vitamins A & D  (VITAMIN A & D) [address] UNITS TABS Take 1 tablet by mouth daily.     [provider]  warfarin (COUMADIN) 5 MG tablet Take 5 mg by mouth daily. 09/22/19   [provider]    Allergies Patient has no known allergies.  No family history on file.  Social History Social History   Tobacco Use  . Smoking status: Never Smoker  . Smokeless tobacco: Never Used  Substance Use Topics  . Alcohol use: No  . Drug use: Never    Review of Systems Constitutional: No fever/chills Eyes: No visual changes. ENT: No sore throat. Cardiovascular: Denies chest pain. Respiratory: Denies shortness of breath. Gastrointestinal: No abdominal pain.  No nausea, no vomiting.  No diarrhea.  No constipation.  Genitourinary: Negative for dysuria. Musculoskeletal: Negative for neck pain.  Negative for back pain. Integumentary: Negative for rash. Neurological: Acute onset and severe right-sided weakness greater than his chronic right arm weakness at baseline, everything has resolved other than right leg weakness.   ____________________________________________   PHYSICAL EXAM:  VITAL SIGNS: ED Triage Vitals  Enc Vitals Group     BP 03/28/2020 0034 (!) 147/92     Pulse Rate 03/17/2020 0034 65     Resp 03/22/2020 0034 20     Temp 03/31/2020 0034 98.1 F (36.7 C)     Temp src --      SpO2 04/05/2020 0034 97 %     Weight 04/08/2020 0035 99.8 kg (220 lb)     Height 03/24/2020 0035 1.829 m (6')     Head Circumference --      Peak Flow --      Pain Score 03/20/2020 0035 0     Pain Loc --      Pain Edu? --      Excl. in [place]? --     Constitutional: Alert and oriented.  The patient does not appear to be in distress. Eyes: Conjunctivae are normal.  Pupils are equal and reactive.  No nystagmus. Head: Atraumatic. Nose: No congestion/rhinnorhea. Mouth/Throat: Patient is wearing a mask. Neck: No stridor.  No meningeal signs.   Cardiovascular: Normal rate, regular rhythm. Good peripheral circulation.  Grossly normal heart sounds. Respiratory: Normal respiratory effort.  No retractions. Gastrointestinal: Soft and nontender. No distention.  Musculoskeletal: No lower extremity tenderness nor edema. No gross deformities of extremities. Neurologic:  Normal speech and language.  The patient has no facial droop and has no tongue deviation and has a symmetrical smile.  He has normal arm strength and grip strength in the left arm and hand.  His right arm is diminished and he is unable to fully raise his arm up to a 45 degree angle but he says this is normal.  He is able to raise his left leg off of the bed but has difficulty holding it for an extended period of time he declines this is normal.  His right leg he can just barely resist gravity to raise off the bed for a couple of seconds before he falls again.  He says this is abnormal.  He has no aphasia and no dysarthria. Skin:  Skin is warm, dry and intact. Psychiatric: Mood and affect are normal. Speech and behavior are normal.  ____________________________________________   LABS (all labs ordered are listed, but only abnormal results are displayed)  Labs Reviewed  RESPIRATORY PANEL BY RT PCR (FLU A&B, COVID)  PROTIME-INR  APTT  CBC  DIFFERENTIAL  COMPREHENSIVE METABOLIC PANEL  URINE DRUG SCREEN, QUALITATIVE ([facility] ONLY)  ETHANOL  URINALYSIS, COMPLETE (UACMP) WITH MICROSCOPIC  CBG MONITORING, ED   ____________________________________________  EKG  ED ECG REPORT I, Hinda Kehr, the attending physician, personally viewed and interpreted this ECG.  Date: 04/05/2020 EKG Time: 00:34 Rate: 56 Rhythm: a-fib w/ bradycardia QRS Axis: normal Intervals: abnormal due to a-fib, otherwise unremarkable ST/T Wave abnormalities: Non-specific ST segment / T-wave changes, but no clear evidence of acute ischemia. Narrative Interpretation: no definitive evidence of acute ischemia; does not meet STEMI  criteria.   ____________________________________________  RADIOLOGY I, Hinda Kehr, personally viewed and evaluated these images (plain radiographs) as part of my medical decision making, as well as reviewing the written report by the radiologist.  ED MD interpretation:  No acute abnormalities identified on CT  head (non-con)  Official radiology report(s): No results found.  ____________________________________________   PROCEDURES   Procedure(s) performed (including Critical Care):  .Critical Care Performed by: Hinda Kehr, MD Authorized by: Hinda Kehr, MD   Critical care provider statement:    Critical care time (minutes):  30   Critical care time was exclusive of:  Separately billable procedures and treating other patients   Critical care was necessary to treat or prevent imminent or life-threatening deterioration of the following conditions:  CNS failure or compromise   Critical care was time spent personally by me on the following activities:  Development of treatment plan with patient or surrogate, discussions with consultants, evaluation of patient's response to treatment, examination of patient, obtaining history from patient or surrogate, ordering and performing treatments and interventions, ordering and review of laboratory studies, ordering and review of radiographic studies, pulse oximetry, re-evaluation of patient's condition and review of old charts .1-3 Lead EKG Interpretation Performed by: Hinda Kehr, MD Authorized by: Hinda Kehr, MD     Interpretation: abnormal     ECG rate:  58   ECG rate assessment: bradycardic     Rhythm: atrial fibrillation     Ectopy: none     Conduction: normal       ____________________________________________   INITIAL IMPRESSION / MDM / ASSESSMENT AND PLAN / ED COURSE  As part of my medical decision making, I reviewed the following data within the [place] notes reviewed and incorporated,  Labs reviewed , EKG interpreted , Old chart reviewed, Discussed with admitting physician  and Notes from prior ED visits   Differential diagnosis includes, but is not limited to, CVA/TIA including the possibility of LVO, acute intracranial bleed, acute infection which is reactivating prior CVA symptoms.  The patient has had no infectious symptoms and he seems to be providing a good history at this time.  Vital signs are stable and he is afebrile.  His physical and neurological exams are as described above, notable for what he describes as new weakness in the right leg but otherwise the profound symptoms that were present for EMS and the patient says he remembers have resolved.  I discussed with the patient that initially he was refusing all care.  After we talked about it for little while, he is still resistant to the idea of staying in the hospital, but I suggested that we work him up and we will readdress the disposition issue later.  Given that he is on warfarin, he is not a TPA candidate, and regardless his symptoms have almost completely resolved.  Since TPA is contraindicated I do not think that there is benefit to calling a stat teleneurology consult.  I will obtain a without contrast head CT and likely will obtain additional imaging including MRI/MRA.  However the patient would benefit from admission for full stroke work-up but he may not be amenable to this.  The patient is on the cardiac monitor to evaluate for evidence of arrhythmia and/or significant heart rate changes.  Lab work is pending.       Clinical Course as of Mar 29 434  Wed Mar 29, 2020  0102 Stable renal function.  Leukocytosis of unclear etiology.  No substantial electrolyte abnormalities.   [CF]  0200 SARS Coronavirus 2 by RT PCR: NEGATIVE [CF]  0200 No acute abnormalities identified on non-con head CT  CT HEAD WO CONTRAST [CF]  0254 I reassessed the patient.  Neurologically he is unchanged.  He still has  minimal use of  his right leg which is a change from baseline but his speech is still clear with no obvious cranial nerve deficits.  I updated him about the results of the CT head and I encouraged him to allow admission to the hospital for a full CVA work-up.  This time he agrees.  I have ordered 100 mL/h normal saline IV infusion and consult the hospitalist for admission.   [CF]  [phone] Discussed case with Dr. Damita Dunnings with the hospitalist service who will admit.   [CF]    Clinical Course User Index [CF] Hinda Kehr, MD     ____________________________________________  FINAL CLINICAL IMPRESSION(S) / ED DIAGNOSES  Final diagnoses:  Cerebrovascular accident (CVA), unspecified mechanism ([place])  Warfarin anticoagulation  Chronic kidney disease, unspecified CKD stage     MEDICATIONS GIVEN DURING THIS VISIT:  Medications  0.9 %  sodium chloride infusion (has no administration in time range)  atorvastatin (LIPITOR) tablet 40 mg (has no administration in time range)  lisinopril (ZESTRIL) tablet 10 mg (has no administration in time range)  metoprolol tartrate (LOPRESSOR) tablet 12.5 mg (has no administration in time range)  aspirin chewable tablet 81 mg (has no administration in time range)   stroke: mapping our early stages of recovery book (has no administration in time range)  acetaminophen (TYLENOL) tablet 650 mg (has no administration in time range)    Or  acetaminophen (TYLENOL) 160 MG/5ML solution 650 mg (has no administration in time range)    Or  acetaminophen (TYLENOL) suppository 650 mg (has no administration in time range)  senna-docusate (Senokot-S) tablet 1 tablet (has no administration in time range)  insulin aspart (novoLOG) injection 0-15 Units (has no administration in time range)  insulin aspart (novoLOG) injection 0-5 Units (has no administration in time range)     ED Discharge Orders    None      *Please note:  Richard [name] was evaluated in Emergency Department on  03/27/2020 for the symptoms described in the history of present illness. He was evaluated in the context of the global COVID-19 pandemic, which necessitated consideration that the patient might be at risk for infection with the SARS-CoV-2 virus that causes COVID-19. Institutional protocols and algorithms that pertain to the evaluation of patients at risk for COVID-19 are in a state of rapid change based on information released by regulatory bodies including the CDC and federal and state organizations. These policies and algorithms were followed during the patient's care in the ED.  Some ED evaluations and interventions may be delayed as a result of limited staffing during the pandemic.*  Note:  This document was prepared using Dragon voice recognition software and may include unintentional dictation errors.   Hinda Kehr, MD 04/08/2020 [phone]

## 2020-03-30 ENCOUNTER — Inpatient Hospital Stay: Payer: Medicare Other

## 2020-03-30 ENCOUNTER — Observation Stay: Payer: Medicare Other

## 2020-03-30 ENCOUNTER — Other Ambulatory Visit: Payer: Self-pay

## 2020-03-30 DIAGNOSIS — Z4682 Encounter for fitting and adjustment of non-vascular catheter: Secondary | ICD-10-CM | POA: Diagnosis not present

## 2020-03-30 DIAGNOSIS — Z20822 Contact with and (suspected) exposure to covid-19: Secondary | ICD-10-CM | POA: Diagnosis present

## 2020-03-30 DIAGNOSIS — N184 Chronic kidney disease, stage 4 (severe): Secondary | ICD-10-CM | POA: Diagnosis present

## 2020-03-30 DIAGNOSIS — I130 Hypertensive heart and chronic kidney disease with heart failure and stage 1 through stage 4 chronic kidney disease, or unspecified chronic kidney disease: Secondary | ICD-10-CM | POA: Diagnosis present

## 2020-03-30 DIAGNOSIS — I5030 Unspecified diastolic (congestive) heart failure: Secondary | ICD-10-CM | POA: Diagnosis present

## 2020-03-30 DIAGNOSIS — E440 Moderate protein-calorie malnutrition: Secondary | ICD-10-CM | POA: Diagnosis present

## 2020-03-30 DIAGNOSIS — D6859 Other primary thrombophilia: Secondary | ICD-10-CM | POA: Diagnosis present

## 2020-03-30 DIAGNOSIS — Z8719 Personal history of other diseases of the digestive system: Secondary | ICD-10-CM | POA: Diagnosis not present

## 2020-03-30 DIAGNOSIS — I6200 Nontraumatic subdural hemorrhage, unspecified: Secondary | ICD-10-CM | POA: Diagnosis present

## 2020-03-30 DIAGNOSIS — Z89421 Acquired absence of other right toe(s): Secondary | ICD-10-CM | POA: Diagnosis not present

## 2020-03-30 DIAGNOSIS — Z7901 Long term (current) use of anticoagulants: Secondary | ICD-10-CM | POA: Diagnosis not present

## 2020-03-30 DIAGNOSIS — Z515 Encounter for palliative care: Secondary | ICD-10-CM | POA: Diagnosis not present

## 2020-03-30 DIAGNOSIS — Z794 Long term (current) use of insulin: Secondary | ICD-10-CM | POA: Diagnosis not present

## 2020-03-30 DIAGNOSIS — Z4659 Encounter for fitting and adjustment of other gastrointestinal appliance and device: Secondary | ICD-10-CM | POA: Diagnosis not present

## 2020-03-30 DIAGNOSIS — R471 Dysarthria and anarthria: Secondary | ICD-10-CM | POA: Diagnosis present

## 2020-03-30 DIAGNOSIS — I69351 Hemiplegia and hemiparesis following cerebral infarction affecting right dominant side: Secondary | ICD-10-CM | POA: Diagnosis not present

## 2020-03-30 DIAGNOSIS — N319 Neuromuscular dysfunction of bladder, unspecified: Secondary | ICD-10-CM | POA: Diagnosis present

## 2020-03-30 DIAGNOSIS — R1310 Dysphagia, unspecified: Secondary | ICD-10-CM | POA: Diagnosis not present

## 2020-03-30 DIAGNOSIS — R531 Weakness: Secondary | ICD-10-CM | POA: Diagnosis not present

## 2020-03-30 DIAGNOSIS — E1122 Type 2 diabetes mellitus with diabetic chronic kidney disease: Secondary | ICD-10-CM | POA: Diagnosis present

## 2020-03-30 DIAGNOSIS — I480 Paroxysmal atrial fibrillation: Secondary | ICD-10-CM | POA: Diagnosis present

## 2020-03-30 DIAGNOSIS — L89301 Pressure ulcer of unspecified buttock, stage 1: Secondary | ICD-10-CM | POA: Diagnosis present

## 2020-03-30 DIAGNOSIS — N171 Acute kidney failure with acute cortical necrosis: Secondary | ICD-10-CM | POA: Diagnosis not present

## 2020-03-30 DIAGNOSIS — N189 Chronic kidney disease, unspecified: Secondary | ICD-10-CM | POA: Diagnosis not present

## 2020-03-30 DIAGNOSIS — E870 Hyperosmolality and hypernatremia: Secondary | ICD-10-CM | POA: Diagnosis not present

## 2020-03-30 DIAGNOSIS — I10 Essential (primary) hypertension: Secondary | ICD-10-CM | POA: Diagnosis not present

## 2020-03-30 DIAGNOSIS — E1151 Type 2 diabetes mellitus with diabetic peripheral angiopathy without gangrene: Secondary | ICD-10-CM | POA: Diagnosis present

## 2020-03-30 DIAGNOSIS — I639 Cerebral infarction, unspecified: Secondary | ICD-10-CM | POA: Diagnosis present

## 2020-03-30 DIAGNOSIS — N179 Acute kidney failure, unspecified: Secondary | ICD-10-CM | POA: Diagnosis not present

## 2020-03-30 DIAGNOSIS — N390 Urinary tract infection, site not specified: Secondary | ICD-10-CM | POA: Diagnosis present

## 2020-03-30 DIAGNOSIS — Z66 Do not resuscitate: Secondary | ICD-10-CM | POA: Diagnosis present

## 2020-03-30 DIAGNOSIS — R29810 Facial weakness: Secondary | ICD-10-CM | POA: Diagnosis present

## 2020-03-30 DIAGNOSIS — D6851 Activated protein C resistance: Secondary | ICD-10-CM | POA: Diagnosis present

## 2020-03-30 LAB — GLUCOSE, CAPILLARY
Glucose-Capillary: 137 mg/dL — ABNORMAL HIGH (ref 70–99)
Glucose-Capillary: 154 mg/dL — ABNORMAL HIGH (ref 70–99)
Glucose-Capillary: 142 mg/dL — ABNORMAL HIGH (ref 70–99)
Glucose-Capillary: 163 mg/dL — ABNORMAL HIGH (ref 70–99)

## 2020-03-30 LAB — HEMOGLOBIN A1C
Hgb A1c MFr Bld: 6.7 % — ABNORMAL HIGH (ref 4.8–5.6)
Mean Plasma Glucose: 146 mg/dL

## 2020-03-30 LAB — PROTIME-INR
INR: 2.2 — ABNORMAL HIGH (ref 0.8–1.2)
Prothrombin Time: 24.4 seconds — ABNORMAL HIGH (ref 11.4–15.2)

## 2020-03-30 MED ORDER — OSMOLITE 1.2 CAL PO LIQD: 1000.0000 mL | ORAL | Status: DC

## 2020-03-30 MED ORDER — DEXTROSE IN LACTATED RINGERS 5 % IV SOLN: INTRAVENOUS | Status: DC

## 2020-03-30 NOTE — Progress Notes (Addendum)
Progress Note    [name]  RAQ:[id number] DOB: 08-Jun-1931  DOA: 03/27/2020 PCP: [name], MD      Brief Narrative:    Medical records reviewed and are as summarized below:  Richard [name] is an 86 y.o. male  with medical history significant for paroxysmal A. 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       Assessment/Plan:   Principal Problem:   Acute CVA (cerebrovascular accident) ([place]) Active Problems:   PAF (paroxysmal atrial fibrillation) (HCC)   HTN (hypertension)   CKD (chronic kidney disease) stage 4, GFR 15-29 ml/min (HCC)   Hemiparesis affecting right side as late effect of cerebrovascular accident (CVA) ([place])   Type 2 diabetes mellitus with diabetic peripheral angiopathy without gangrene ([place])   Neurogenic bladder   Chronic anticoagulation   Pressure injury of buttock, stage 1, left    Acute CVA (cerebrovascular accident) ([place]) with right hemiparesis -MRI brain showed small acute infarct in the left basal ganglia and corona radiata.  Repeat CT head did not show any evidence of intracranial bleed. -Patient with a history of CVA with residual right upper extremity weakness and right facial droop -Continue Coumadin and statins -Cardiac monitoring -2D echo showed EF estimated at 60 to 65%, moderate LVH, grade 1 diastolic dysfunction -Follow-up with neurologist. -PT and OT recommend discharge to SNF  Dysphagia -Patient failed swallow evaluation.  He has been made NPO.  NG tube will be placed for enteral nutrition and medications.  Consult dietitian to assist with nutrition.  Start IV fluids for now.    PAF  (paroxysmal atrial fibrillation) (HCC) -Continue Coumadin and metoprolol    HTN (hypertension) -Continue lisinopril    CKD (chronic kidney disease) stage 4, GFR 15-29 ml/min (HCC) -Renal function at baseline      Type 2 diabetes mellitus with diabetic peripheral angiopathy without gangrene (HCC) -Sliding scale insulin coverage    Neurogenic bladder -Can continue intermittent self-catheterization as needed.    Chronic anticoagulation History of PE History of factor V Leiden deficiency -Pharmacy consult for Coumadin   Body mass index is 29.84 kg/m.   Stage I left buttock decubitus ulcer Continue local wound care  Pressure Injury 03/30/20 Buttocks Left Stage 1 -  Intact skin with non-blanchable redness of a localized area usually over a bony prominence. Moisture associated damage (Active)  03/30/20 0036  Location: Buttocks  Location Orientation: Left  Staging: Stage 1 -  Intact skin with non-blanchable redness of a localized area usually over a bony prominence.  Wound Description (Comments): Moisture associated damage  Present on Admission: Yes          Family Communication/Anticipated D/C date and plan/Code Status   DVT prophylaxis: Warfarin Code Status: DNR Family Communication: Plan discussed with patient Disposition Plan:    Status is: Inpatient  Remains inpatient appropriate because:Unsafe d/c plan   Dispo: The patient is from: Home              Anticipated d/c is to: SNF              Anticipated d/c date is: > 3 days  Patient currently is not medically stable to d/c.            Subjective:   No complaints.  He feels the weakness in the right arm has improved.  Objective:    Vitals:   03/30/2020 2330 03/30/20 0006 03/30/20 0714 03/30/20 1238  BP: (!) 116/59 (!) 142/85 (!) 174/96 121/64  Pulse: 66 79 71 63  Resp:  20 18 16   Temp:  97.7 F (36.5 C) 98.7 F (37.1 C) 98.1 F (36.7 C)  TempSrc:  Oral Oral Oral  SpO2:  99% 99% 100% 98%  Weight:      Height:        Intake/Output Summary (Last 24 hours) at 03/30/2020 1535 Last data filed at 03/30/2020 1232 Gross per 24 hour  Intake --  Output 750 ml  Net -750 ml   Filed Weights   03/27/2020 0035  Weight: 99.8 kg    Exam:  GEN: NAD SKIN: No rash.  Stage I left buttock decubitus ulcer present on admission EYES: EOMI ENT: MMM CV: RRR PULM: CTA B ABD: soft, ND, NT, +BS CNS: AAO x 3, right facial droop, power in right upper extremity has improved to 4/5 and right lower extremity 3/5. EXT: No edema or tenderness     Data Reviewed:   I have personally reviewed following labs and imaging studies:  Labs: Labs show the following:   Basic Metabolic Panel: Recent Labs  Lab 04/10/2020 0044  NA 140  K 4.6  CL 109  CO2 22  GLUCOSE 158*  BUN 50*  CREATININE 2.24*  CALCIUM 8.4*   GFR Estimated Creatinine Clearance: 29 mL/min (A) (by C-G formula based on SCr of 2.24 mg/dL (H)). Liver Function Tests: Recent Labs  Lab 03/22/2020 0044  AST 22  ALT 17  ALKPHOS 95  BILITOT 0.6  PROT 7.0  ALBUMIN 2.9*   No results for input(s): LIPASE, AMYLASE in the last 168 hours. No results for input(s): AMMONIA in the last 168 hours. Coagulation profile Recent Labs  Lab 04/04/2020 0142 03/30/20 0501  INR 2.2* 2.2*    CBC: Recent Labs  Lab 03/21/2020 0044  WBC 14.3*  NEUTROABS 9.0*  HGB 13.5  HCT 41.2  MCV 98.3  PLT 221   Cardiac Enzymes: No results for input(s): CKTOTAL, CKMB, CKMBINDEX, TROPONINI in the last 168 hours. BNP (last 3 results) No results for input(s): PROBNP in the last 8760 hours. CBG: Recent Labs  Lab 03/27/2020 1224 03/25/2020 1800 03/26/2020 2231 03/30/20 0716 03/30/20 1213  GLUCAP 104* 104* 135* 137* 154*   D-Dimer: No results for input(s): DDIMER in the last 72 hours. Hgb A1c: Recent Labs    03/19/2020 0044  HGBA1C 6.7*   Lipid Profile: Recent Labs    03/17/2020 0044  CHOL 128  HDL 36*  LDLCALC 75  TRIG 85   CHOLHDL 3.6   Thyroid function studies: No results for input(s): TSH, T4TOTAL, T3FREE, THYROIDAB in the last 72 hours.  Invalid input(s): FREET3 Anemia work up: No results for input(s): VITAMINB12, FOLATE, FERRITIN, TIBC, IRON, RETICCTPCT in the last 72 hours. Sepsis Labs: Recent Labs  Lab 04/09/2020 0044  WBC 14.3*    Microbiology Recent Results (from the past 240 hour(s))  Respiratory Panel by RT PCR (Flu A&B, Covid) - Nasopharyngeal Swab     Status: None   Collection Time: 03/19/2020 12:52 AM   Specimen: Nasopharyngeal Swab  Result Value Ref Range Status   SARS Coronavirus 2 by RT PCR NEGATIVE NEGATIVE Final  Comment: (NOTE) SARS-CoV-2 target nucleic acids are NOT DETECTED. The SARS-CoV-2 RNA is generally detectable in upper respiratoy specimens during the acute phase of infection. The lowest concentration of SARS-CoV-2 viral copies this assay can detect is 131 copies/mL. A negative result does not preclude SARS-Cov-2 infection and should not be used as the sole basis for treatment or other patient management decisions. A negative result may occur with  improper specimen collection/handling, submission of specimen other than nasopharyngeal swab, presence of viral mutation(s) within the areas targeted by this assay, and inadequate number of viral copies (<131 copies/mL). A negative result must be combined with clinical observations, patient history, and epidemiological information. The expected result is Negative. Fact Sheet for Patients:  PinkCheek.be Fact Sheet for Healthcare Providers:  GravelBags.it This test is not yet ap proved or cleared by the [place] FDA and  has been authorized for detection and/or diagnosis of SARS-CoV-2 by FDA under an Emergency Use Authorization (EUA). This EUA will remain  in effect (meaning this test can be used) for the duration of the COVID-19 declaration under Section  564(b)(1) of the Act, 21 U.S.C. section 360bbb-3(b)(1), unless the authorization is terminated or revoked sooner.    Influenza A by PCR NEGATIVE NEGATIVE Final   Influenza B by PCR NEGATIVE NEGATIVE Final    Comment: (NOTE) The Xpert Xpress SARS-CoV-2/FLU/RSV assay is intended as an aid in  the diagnosis of influenza from Nasopharyngeal swab specimens and  should not be used as a sole basis for treatment. Nasal washings and  aspirates are unacceptable for Xpert Xpress SARS-CoV-2/FLU/RSV  testing. Fact Sheet for Patients: PinkCheek.be Fact Sheet for Healthcare Providers: GravelBags.it This test is not yet approved or cleared by the [place] FDA and  has been authorized for detection and/or diagnosis of SARS-CoV-2 by  FDA under an Emergency Use Authorization (EUA). This EUA will remain  in effect (meaning this test can be used) for the duration of the  Covid-19 declaration under Section 564(b)(1) of the Act, 21  U.S.C. section 360bbb-3(b)(1), unless the authorization is  terminated or revoked. Performed at [facility], [address] [place], [place] [address]     Procedures and diagnostic studies:  CT HEAD WO CONTRAST  Result Date: 03/30/2020 CLINICAL DATA:  Stroke follow-up EXAM: CT HEAD WITHOUT CONTRAST TECHNIQUE: Contiguous axial images were obtained from the base of the skull through the vertex without intravenous contrast. COMPARISON:  Yesterday FINDINGS: Brain: Patient's left-sided subcortical infarct is subtle compared to prior brain MRI. No hemorrhagic conversion or evidence of progression. Generalized cerebral volume loss in keeping with age. No hydrocephalus or masslike finding. Vascular: Atherosclerosis and tortuosity of intracranial vessels. Skull: Normal. Negative for fracture or focal lesion. Sinuses/Orbits: No acute finding IMPRESSION: No hemorrhagic conversion or evidence of infarct progression.  Electronically Signed   By: Monte Fantasia M.D.   On: 03/30/2020 06:50   CT HEAD WO CONTRAST  Result Date: 03/18/2020 CLINICAL DATA:  Right-sided weakness with facial droop EXAM: CT HEAD WITHOUT CONTRAST TECHNIQUE: Contiguous axial images were obtained from the base of the skull through the vertex without intravenous contrast. COMPARISON:  MRI 11/12/2019, CT brain 11/12/2019 FINDINGS: Brain: No acute territorial infarction, hemorrhage or intracranial mass. Mild atrophy and chronic small vessel ischemic change of the white matter. Stable ventricle size. Vascular: No hyperdense vessels.  Carotid vascular calcification. Skull: Normal. Negative for fracture or focal lesion. Sinuses/Orbits: No acute finding. Other: None IMPRESSION: 1. No CT evidence for acute intracranial abnormality. 2. Atrophy and chronic  small vessel ischemic change of the white matter Electronically Signed   By: Donavan Foil M.D.   On: 03/28/2020 01:44   MR BRAIN WO CONTRAST  Result Date: 04/12/2020 CLINICAL DATA:  Acute onset of right-sided weakness with facial droop that has nearly completely resolved EXAM: MRI HEAD WITHOUT CONTRAST TECHNIQUE: Multiplanar, multiecho pulse sequences of the brain and surrounding structures were obtained without intravenous contrast. COMPARISON:  Head CT from earlier today. FINDINGS: Brain: Elongated patchy restricted diffusion at the left putamen and corona radiata. Chronic blood products along the right cerebral convexity. There is subtle subdural collection/dural thickening along the right frontal parietal convexity which is not dense by CT, 2 mm in maximal thickness. This does not follow CSF on FLAIR imaging. No hydrocephalus or masslike finding. Vascular: Preserved flow voids. Intracranial vessels are tortuous, often attributed to chronic hypertension. Skull and upper cervical spine: Normal marrow signal. Degenerative facet spurring in the upper cervical spine. Sinuses/Orbits: Partial bilateral mastoid  opacification. Mucosal thickening in the left sphenoid sinus. Bilateral cataract resection. Other: 3.7 cm suboccipital lipoma in the midline posterior neck. IMPRESSION: 1. Small acute perforator infarction at the left basal ganglia and corona radiata. 2. Thin (2 mm) subdural collection or dural thickening along the right frontal parietal convexity, likely subacute hematoma. Electronically Signed   By: Monte Fantasia M.D.   On: 04/01/2020 06:21   US Carotid Bilateral (at [facility] and [facility] only)  Result Date: 03/26/2020 CLINICAL DATA:  Stroke symptoms, diabetes EXAM: BILATERAL CAROTID DUPLEX ULTRASOUND TECHNIQUE: Pearline Cables scale imaging, color Doppler and duplex ultrasound were performed of bilateral carotid and vertebral arteries in the neck. COMPARISON:  11/13/2019 FINDINGS: Criteria: Quantification of carotid stenosis is based on velocity parameters that correlate the residual internal carotid diameter with NASCET-based stenosis levels, using the diameter of the distal internal carotid lumen as the denominator for stenosis measurement. The following velocity measurements were obtained: RIGHT ICA: 108/21 cm/sec CCA: 106/26 cm/sec SYSTOLIC ICA/CCA RATIO:  0.8 ECA: 136 cm/sec LEFT ICA: 89/12 cm/sec CCA: 94/8 cm/sec SYSTOLIC ICA/CCA RATIO:  0.9 ECA: 86 cm/sec RIGHT CAROTID ARTERY: Moderate carotid atherosclerosis with echogenic shadowing calcification at the bifurcation. Slight tortuosity noted. Despite this, no hemodynamically significant right ICA stenosis, turbulent flow, or spectral broadening. Degree of stenosis estimated at less than 50%. RIGHT VERTEBRAL ARTERY:  Antegrade LEFT CAROTID ARTERY: Similar moderate atherosclerosis and tortuosity. No hemodynamically significant left ICA stenosis velocity elevation, turbulent flow. Degree of narrowing also less than 50%. LEFT VERTEBRAL ARTERY:  Antegrade IMPRESSION: Moderate bilateral carotid atherosclerosis with no hemodynamically significant ICA stenosis degree of narrowing  estimated at less than 50% bilaterally by ultrasound criteria. Overall stable exam. Patent antegrade vertebral flow bilaterally Electronically Signed   By: Jerilynn Mages.  Shick M.D.   On: 03/17/2020 16:16   ECHOCARDIOGRAM COMPLETE  Result Date: 04/12/2020    ECHOCARDIOGRAM REPORT   Patient Name:   YAW ESCOTO Date of Exam: 03/19/2020 Medical Rec #:  [id number]        Height:       72.0 in Accession #:    [id number]       Weight:       220.0 lb Date of Birth:  May 03, 1931        BSA:          2.219 m Patient Age:    89 years         BP:           106/84 mmHg Patient Gender: M  HR:           58 bpm. Exam Location:  [facility] Procedure: 2D Echo, Color Doppler, Cardiac Doppler and Intracardiac            Opacification Agent Indications:     I163.9 Stroke  History:         Patient has prior history of Echocardiogram examinations, most                  recent 11/13/2019. CKD, Arrythmias:Atrial Fibrillation; Risk                  Factors:Hypertension and Diabetes. Cancer.  Sonographer:     Charmayne Sheer RDCS (AE) Referring Phys:  [id number] Athena Masse Diagnosing Phys: Bartholome Bill MD  Sonographer Comments: Suboptimal apical window and suboptimal subcostal window. IMPRESSIONS  1. Left ventricular ejection fraction, by estimation, is 60 to 65%. The left ventricle has normal function. The left ventricle has no regional wall motion abnormalities. There is moderate left ventricular hypertrophy. Left ventricular diastolic parameters are consistent with Grade I diastolic dysfunction (impaired relaxation).  2. Right ventricular systolic function is normal. The right ventricular size is normal.  3. Left atrial size was mildly dilated.  4. The mitral valve is degenerative. Trivial mitral valve regurgitation.  5. The aortic valve was not well visualized. Aortic valve regurgitation is mild to moderate. FINDINGS  Left Ventricle: Left ventricular ejection fraction, by estimation, is 60 to 65%. The left ventricle has normal function.  The left ventricle has no regional wall motion abnormalities. Definity contrast agent was given IV to delineate the left ventricular  endocardial borders. The left ventricular internal cavity size was normal in size. There is moderate left ventricular hypertrophy. Left ventricular diastolic parameters are consistent with Grade I diastolic dysfunction (impaired relaxation). Right Ventricle: The right ventricular size is normal. No increase in right ventricular wall thickness. Right ventricular systolic function is normal. Left Atrium: Left atrial size was mildly dilated. Right Atrium: Right atrial size was normal in size. Pericardium: There is no evidence of pericardial effusion. Mitral Valve: The mitral valve is degenerative in appearance. Trivial mitral valve regurgitation. MV peak gradient, 3.3 mmHg. The mean mitral valve gradient is 2.0 mmHg. Tricuspid Valve: The tricuspid valve is not well visualized. Tricuspid valve regurgitation is trivial. Aortic Valve: The aortic valve was not well visualized. Aortic valve regurgitation is mild to moderate. Aortic valve mean gradient measures 3.0 mmHg. Aortic valve peak gradient measures 6.0 mmHg. Aortic valve area, by VTI measures 2.13 cm. Pulmonic Valve: The pulmonic valve was not well visualized. Pulmonic valve regurgitation is trivial. Aorta: The aortic root was not well visualized. IAS/Shunts: The interatrial septum was not assessed.  LEFT VENTRICLE PLAX 2D LVIDd:         4.27 cm LVIDs:         2.84 cm LV PW:         1.18 cm LV IVS:        0.89 cm LVOT diam:     2.40 cm LV SV:         60 LV SV Index:   27 LVOT Area:     4.52 cm  LEFT ATRIUM           Index LA diam:      4.40 cm 1.98 cm/m LA Vol (A4C): 62.4 ml 28.13 ml/m  AORTIC VALVE                   PULMONIC VALVE  AV Area (Vmax):    2.37 cm    PV Vmax:       0.83 m/s AV Area (Vmean):   2.48 cm    PV Vmean:      50.800 cm/s AV Area (VTI):     2.13 cm    PV VTI:        0.181 m AV Vmax:           122.00 cm/s PV  Peak grad:  2.7 mmHg AV Vmean:          78.900 cm/s PV Mean grad:  1.0 mmHg AV VTI:            0.282 m AV Peak Grad:      6.0 mmHg AV Mean Grad:      3.0 mmHg LVOT Vmax:         64.00 cm/s LVOT Vmean:        43.300 cm/s LVOT VTI:          0.133 m LVOT/AV VTI ratio: 0.47  AORTA Ao Root diam: 3.90 cm MITRAL VALVE MV Area (PHT): 2.53 cm    SHUNTS MV Peak grad:  3.3 mmHg    Systemic VTI:  0.13 m MV Mean grad:  2.0 mmHg    Systemic Diam: 2.40 cm MV Vmax:       0.90 m/s MV Vmean:      57.3 cm/s MV Decel Time: 300 msec MV E velocity: 73.87 cm/s Bartholome Bill MD Electronically signed by Bartholome Bill MD Signature Date/Time: 04/13/2020/10:50:12 AM    Final     Medications:   .  stroke: mapping our early stages of recovery book   Does not apply Once  . aspirin  81 mg Oral Daily  . atorvastatin  40 mg Oral Daily  . insulin aspart  0-15 Units Subcutaneous TID WC  . insulin aspart  0-5 Units Subcutaneous QHS  . lisinopril  10 mg Oral Daily  . metoprolol tartrate  2.5 mg Intravenous Q6H  . warfarin  5 mg Oral q1600  . Warfarin - Pharmacist Dosing Inpatient   Does not apply q1600   Continuous Infusions: . dextrose 5% lactated ringers    . feeding supplement (OSMOLITE 1.2 CAL)       LOS: 0 days   Terrea Bruster  [facility]     03/30/2020, 3:35 PM

## 2020-03-30 NOTE — Evaluation (Signed)
Physical Therapy Evaluation Patient Details Name: Richard [name] MRN: [id number] DOB: 1931-11-27 Today's Date: 03/30/2020   History of Present Illness  Richard [name] is an 81yoM who comes to [facility] 4/14 c acute onset right hemi weakness. Pt reports inability to raise the right arm or move the right leg as well as right-sided facial droop with drooling and slurred speech. By time of arrival, symptoms mostly resolved. At baseline patient ambulates with a cane. 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 MRI shows: Small acute perforator infarction at the left basal ganglia and corona radiata. Thin (2 mm) subdural collection or dural thickening along the right frontal parietal convexity, likely subacute hematoma.  Clinical Impression  Pt admitted with above diagnosis. Pt currently with functional limitations due to the deficits listed below (see "PT Problem List"). Updated head CT reveals no evidence of SDH per neurology. Multiple attempts to see patient this morning. Upon entry, pt in bed, awake but drowsy, and agreeable to participate in limited evaluation citing gross fatigue from a busy morning and just recently mobilizing for OT evaluation. Pt continues to have Rt facial droop and moderate dysarthria- hypophonia is likely baseline I suspect. MMT reveals gross weakness of RUE average 3-/5 proximal and 4/5 distally. RLE is grossly 5/5 but clearly at least 50% weaker than the Left side. Pt refuses OOB at this time due to exhaustion. The pt is oriented to location and year, but does present with some mild intermittent confusion at times, otherwise pleasant. Functional mobility assessment demonstrates increased effort/time requirements, poor tolerance, and need for physical assistance, whereas the patient performed  these at a higher level of independence PTA. Per chart review pt had some generalized weakness at baseline, formerly managed with +1 at baseline but I suspect that  He will need more physical assistance at DC. He would also benefit from a higher frequency of rehab services at DC available only in a STR setting. Pt will benefit from skilled PT intervention to increase independence and safety with basic mobility in preparation for discharge to the venue listed below.       Follow Up Recommendations SNF;Supervision/Assistance - 24 hour    Equipment Recommendations  None recommended by PT    Recommendations for Other Services       Precautions / Restrictions Precautions Precautions: Fall Precaution Comments: High Fall Restrictions Weight Bearing Restrictions: No      Mobility  Bed Mobility Overal bed mobility: Needs Assistance Bed Mobility: Supine to Sit     Supine to sit: Max assist Sit to supine: Max assist   General bed mobility comments: Pt reports he cannot do this without heavy assistance; he knows this because he just finished doing it with OT and is now exhausted.  Transfers Overall transfer level: Needs assistance Equipment used: Rolling walker (2 wheeled) Transfers: Sit to/from Stand Sit to Stand: +2 physical assistance;Mod assist;Max assist            Ambulation/Gait                Stairs            Wheelchair Mobility    Modified Rankin (Stroke Patients Only)       Balance Overall balance assessment: Needs assistance Sitting-balance support: Feet supported;Single extremity supported Sitting balance-Leahy Scale: Fair Sitting balance - Comments:  Steady static sitting at EOB with 1 UE support, minimal R Lateral lean noted. Postural control: Right lateral lean Standing balance support: During functional activity;Bilateral upper extremity supported Standing balance-Leahy Scale: Poor Standing balance comment: Pt unable to come to full stand  w/o physical assist from therapist.                             Pertinent Vitals/Pain Pain Assessment: No/denies pain    Home Living Family/patient expects to be discharged to:: Private residence Living Arrangements: Alone Available Help at Discharge: Personal care attendant;Available 24 hours/day Type of Home: House Home Access: Stairs to enter Entrance Stairs-Rails: None Entrance Stairs-Number of Steps: 1 Home Layout: One level Home Equipment: Walker - 2 wheels;Walker - 4 wheels Additional Comments: Reports there are grab bars all around home, even in garage.    Prior Function Level of Independence: Needs assistance   Gait / Transfers Assistance Needed: Needs assistance from son for transfers, able to ambulate to bathroom/household distances with caregiver and son with [phone]; pt sleeps in recliner at baseline  ADL's / Homemaking Assistance Needed: Pt reports needing help with bathing and dressing at baseline.  Comments: Pt noted with expressive difficulties. May be unreliable historian at this time. tells this Pryor Curia he is no longer living at home, but lives in an assisted living facility. Chart review indicates pt lives at home with full time care.     Hand Dominance   Dominant Hand: Right    Extremity/Trunk Assessment   Upper Extremity Assessment Upper Extremity Assessment: RUE deficits/detail RUE Deficits / Details: Grips strong and equal but delayed; Rt elbow flexion 5/5, elbow extension 4/5; Rt shoulder flexion 3-/5 but can isometrically hold overhead when passively assisted.    Lower Extremity Assessment Lower Extremity Assessment: RLE deficits/detail RLE Deficits / Details: Gross limb extension (knee/hip combined) is 5/5 but 50% weaker than Left side when tested.       Communication   Communication: Expressive difficulties  Cognition Arousal/Alertness: Lethargic Behavior During Therapy: WFL for tasks assessed/performed Overall Cognitive Status:  Difficult to assess                                 General Comments: tels me he is in the hospital and that the year is '21st'      General Comments      Exercises     Assessment/Plan    PT Assessment Patient needs continued PT services  PT Problem List Decreased strength;Decreased activity tolerance;Decreased range of motion;Decreased balance;Decreased mobility;Decreased coordination       PT Treatment Interventions DME instruction;Balance training;Gait training;Stair training;Functional mobility training;Therapeutic activities;Therapeutic exercise;Patient/family education    PT Goals (Current goals can be found in the Care Plan section)  Acute Rehab PT Goals Patient Stated Goal: return to baseline PLOF PT Goal Formulation: With patient Time For Goal Achievement: 04/13/20 Potential to Achieve Goals: Poor    Frequency 7X/week   Barriers to discharge Inaccessible home environment would likely need more physical assist than PTA    Co-evaluation               AM-PAC PT "6 Clicks" Mobility  Outcome Measure Help needed turning from your back to your side while in a flat bed without using bedrails?: Total Help needed moving from lying on your back to sitting on the side of a flat bed without using bedrails?: Total  Help needed moving to and from a bed to a chair (including a wheelchair)?: A Lot Help needed standing up from a chair using your arms (e.g., wheelchair or bedside chair)?: A Lot Help needed to walk in hospital room?: A Lot Help needed climbing 3-5 steps with a railing? : Total 6 Click Score: 9    End of Session   Activity Tolerance: Patient tolerated treatment well;No increased pain;Patient limited by fatigue Patient left: in bed;with call bell/phone within reach;with bed alarm set Nurse Communication: Mobility status PT Visit Diagnosis: Difficulty in walking, not elsewhere classified (R26.2);Unsteadiness on feet (R26.81);Muscle weakness  (generalized) (M62.81);Other abnormalities of gait and mobility (R26.89)    Time: 1213-1227 PT Time Calculation (min) (ACUTE ONLY): 14 min   Charges:   PT Evaluation $PT Eval Moderate Complexity: 1 Mod          1:02 PM, 03/30/20 Etta Grandchild, PT, DPT Physical Therapist - [facility]  [phone] ([place])    [place] 03/30/2020, 12:53 PM

## 2020-03-30 NOTE — Evaluation (Signed)
Objective Swallowing Evaluation: Type of Study: MBS-Modified Barium Swallow Study   Patient Details  Name: Richard [name] MRN: [id number] Date of Birth: 11-Oct-1931  Today's Date: 03/30/2020 Time: SLP Start Time (ACUTE ONLY): 0815 -SLP Stop Time (ACUTE ONLY): 0930  SLP Time Calculation (min) (ACUTE ONLY): 75 min   Past Medical History:  Past Medical History:  Diagnosis Date  . Atrial fibrillation ([place])   . Cancer ([place])   . Chronic kidney disease   . Colon polyp   . Diabetes mellitus   . History of blood clotting disorder   . HTN (hypertension)    Past Surgical History:  Past Surgical History:  Procedure Laterality Date  . AMPUTATION TOE Right 11/29/2018   Procedure: AMPUTATION TOE;  Surgeon: Albertine Patricia, DPM;  Location: [facility] ORS;  Service: Podiatry;  Laterality: Right;  . EYE SURGERY     CATARACTS  . HERNIA REPAIR    . LOWER EXTREMITY ANGIOGRAPHY Right 12/02/2018   Procedure: Lower Extremity Angiography;  Surgeon: Algernon Huxley, MD;  Location: [facility];  Service: Cardiovascular;  Laterality: Right;  . SKIN CANCER EXCISION     HPI: Pt is a 86 y.o. male with medical history significant for "3 CVAs" in past, per pt report, paroxysmal A. 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  EMS was called after 2 hours of symptom onset and patient reportedly refused to be taken to [facility] for the possibility of LVO, insisting on being brought to [facility] instead.  ED course: By arrival to the hospital however, his symptoms had reportedly for the most part resolved  with persistent weakness of right upper extremity weakness beyond his usual baseline.  At baseline patient ambulates with a cane.  MRI: "Small acute perforator infarction at the left basal ganglia and corona radiata".    Subjective: pt awake, in mbss chair. Gave a few verbal responses; Dysarthria noted. Drwosy initially.    Assessment / Plan / Recommendation  CHL IP CLINICAL IMPRESSIONS 03/30/2020  Clinical Impression Pt appears to present w/ Moderate-Severe oropharyngeal phase dysphagia w/ Neuromuscular deficits w/ High risk for Aspiration of po's to occur. ANY Aspiration could lead to negative sequelae including Pulmonary decline. During the pharyngeal phase, pt exhibited Moderate-Severe delay in pharyngeal swallow initiation moreso w/ thickened liquid trials; min less w/ puree trials. Nectar and Honey consistency liquids VIA TSP appeared to spill to, and/or fill, the Pyriform Sinuses b/f pharyngeal swallow initiation, This resulted in Aspiration (SILENT) of Nectar consistency liquids w/ a much delayed, weak Cough response. TSP trials of the Honey consistency liquids were slightly more timely in triggering the pharyngeal swallow at the level of spilling to the P.S. Moderate pharyngeal residue remained throughout the pharynx but moreso along the BOT and Valleculae; as trials continued and fatigue factor increased, more bolus residue remained along the aeryepiglottic folds and P.S. Use of a Chin Tuck during the swallow appeared to aid reducing of pharyngeal residue, especially Valleculae residue. A Dry, f/u swallow was also successful in reducing pharyngeal residue. Pt required Verbal cues for follow through w/ strategies. Of note, pt was unable  to generate a Dry, f/u swallow despite Verbal cues. During the oral phase, pt exhibited Moderate oral weakness w/ reduced lingual and labial weakness; decreased lingual tone w/ drooling on R side. During bolus management of trials given, Moderate decreased lingual  strength and coordination resulted in reduced effective and efficient A-P transfer of boluses. Decreased bolus cohesion occurred w/ premature spillage of thickened liquid trials deep into the pharynx. It was effortful and time consuming for pt to organize boluses then complete A-P transfer; pt was Unable to initiate and complete a Dry, f/u swallow when verbally instructed by this SLP(as part of his strategies). The challenge to the Dry, f/u swallows increased when pt was asked to use a Chin Tuck position when swallowing(impact of Gravity pulling bolus material more anteriorly). R Anterior leakage noted; diffuse oral residue noted. Esophageal phase (cervical) was limited in view by pt's shoulders; no retrograde activity noted.  Discussed results w/ MD and the recommendations to remain NPO w/ therapeutic trials of po's under SLP Supervision for follow through w/ swallowing strategies. Therapeutic trials recommended to engage the oropharyngeal swallow in hopes to improve neuromuscular function. Repeat MBSS next week. NSG updated for support of IV fluids; consideration of alternative means of feeding (NG) until next week for nutrition and medications.   SLP Visit Diagnosis Dysphagia, oropharyngeal phase (R13.12)  Attention and concentration deficit following --  Frontal lobe and executive function deficit following --  Impact on safety and function Moderate aspiration risk;Severe aspiration risk;Risk for inadequate nutrition/hydration      CHL IP TREATMENT RECOMMENDATION 03/30/2020  Treatment Recommendations Therapy as outlined in treatment plan below     Prognosis 03/30/2020  Prognosis for Safe Diet Advancement Fair  Barriers to Reach Goals Cognitive deficits;Time post onset;Severity of deficits  Barriers/Prognosis Comment --    CHL IP DIET RECOMMENDATION 03/30/2020  SLP Diet Recommendations NPO  Liquid Administration via (No Data)  Medication Administration Via alternative means  Compensations  Minimize environmental distractions;Slow rate;Small sips/bites;Lingual sweep for clearance of pocketing;Monitor for anterior loss;Multiple dry swallows after each bite/sip;Chin tuck;Effortful swallow  Postural Changes Remain semi-upright after after feeds/meals (Comment);Seated upright at 90 degrees      CHL IP OTHER RECOMMENDATIONS 03/30/2020  Recommended Consults (No Data)  Oral Care Recommendations Oral care BID;Oral care before and after PO;Staff/trained caregiver to provide oral care  Other Recommendations Order thickener from pharmacy;Prohibited food (jello, ice cream, thin soups);Remove water pitcher;Have oral suction available      CHL IP FOLLOW UP RECOMMENDATIONS 03/30/2020  Follow up Recommendations Skilled Nursing facility      [facility] IP FREQUENCY AND DURATION 03/30/2020  Speech Therapy Frequency (ACUTE ONLY) min 3x week  Treatment Duration 2 weeks           CHL IP ORAL PHASE 03/30/2020  Oral Phase Impaired  Oral - Pudding Teaspoon --  Oral - Pudding Cup --  Oral - Honey Teaspoon 6  Oral - Honey Cup --  Oral - Nectar Teaspoon 4  Oral - Nectar Cup --  Oral - Nectar Straw --  Oral - Thin Teaspoon --  Oral - Thin Cup --  Oral - Thin Straw --  Oral - Puree 4  Oral - Mech Soft --  Oral - Regular --  Oral - Multi-Consistency --  Oral - Pill --  Oral Phase - Comment pt exhibited Moderate oral weakness w/ reduced lingual and labial weakness; decreased lingual tone w/ drooling on R side. During bolus management of trials given, Moderate decreased lingual strength and  coordination resulted in reduced effective and efficient A-P transfer of boluses. Decreased bolus cohesion occurred w/ premature spillage of liquid trials into the pharynx. It was effortful and time consuming for pt to organize boluses then complete A-P transfer; pt was Unable to initiate and complete a Dry, f/u swallow when verbally instructed by this SLP(as part of his strategies). The challenge to the Dry, f/u  swallows increased when pt was asked to use a Chin Tuck position when swallowing(impact of Gravity pulling bolus material more anteriorly). R Anterior leakage noted; diffuse oral residue noted.     CHL IP PHARYNGEAL PHASE 03/30/2020  Pharyngeal Phase Impaired  Pharyngeal- Pudding Teaspoon --  Pharyngeal --  Pharyngeal- Pudding Cup --  Pharyngeal --  Pharyngeal- Honey Teaspoon 6  Pharyngeal --  Pharyngeal- Honey Cup --  Pharyngeal --  Pharyngeal- Nectar Teaspoon 4  Pharyngeal --  Pharyngeal- Nectar Cup --  Pharyngeal --  Pharyngeal- Nectar Straw --  Pharyngeal --  Pharyngeal- Thin Teaspoon --  Pharyngeal --  Pharyngeal- Thin Cup --  Pharyngeal --  Pharyngeal- Thin Straw --  Pharyngeal --  Pharyngeal- Puree 4  Pharyngeal --  Pharyngeal- Mechanical Soft --  Pharyngeal --  Pharyngeal- Regular --  Pharyngeal --  Pharyngeal- Multi-consistency --  Pharyngeal --  Pharyngeal- Pill --  Pharyngeal --  Pharyngeal Comment pt exhibited Moderate+ delay in pharyngeal swallow initiation moreso w/ thickened liquid trials; min less w/ puree trials. Nectar and Honey consistency liquids VIA TSP appeared to spill to, and/or fill, the Pyriform Sinuses b/f pharyngeal swallow initiation, This resulted in Aspiration (SILENT) of Nectar consistency liquids w/ a much delayed, weak Cough response. TSP trials of the Honey consistency liquids were slightly more timely in triggering the pharyngeal swallow at the level of spilling to the P.S. Moderate pharyngeal residue remained throughout the pharynx but moreso along the BOT and Valleculae; as trials continued and fatigue factor increased, more bolus residue remained along the aeryepiglottic folds and P.S. Use of a Chin Tuck during the swallow appeared to aid reducing of pharyngeal residue, especially Valleculae residue. A Dry, f/u swallow was also successful in reducing pharyngeal residue. Pt required Verbal cues for follow through w/ strategies. Of note, pt was  unable to generate a Dry, f/u swallow despite Verbal cues.      CHL IP CERVICAL ESOPHAGEAL PHASE 03/30/2020  Cervical Esophageal Phase WFL  Pudding Teaspoon --  Pudding Cup --  Honey Teaspoon --  Honey Cup --  Nectar Teaspoon --  Nectar Cup --  Nectar Straw --  Thin Teaspoon --  Thin Cup --  Thin Straw --  Puree --  Mechanical Soft --  Regular --  Multi-consistency --  Pill --  Cervical Esophageal Comment --       Orinda Kenner, MS, CCC-SLP [facility] 03/30/2020, 10:41 AM

## 2020-03-30 NOTE — Progress Notes (Signed)
Subjective: No new neurological complaints.    Objective: Current vital signs: BP (!) 174/96 (BP Location: Left Arm)   Pulse 71   Temp 98.7 F (37.1 C) (Oral)   Resp 18   Ht 6' (1.829 m)   Wt 99.8 kg   SpO2 100%   BMI 29.84 kg/m  Vital signs in last 24 hours: Temp:  [97.7 F (36.5 C)-98.7 F (37.1 C)] 98.7 F (37.1 C) (04/15 0714) Pulse Rate:  [34-96] 71 (04/15 0714) Resp:  [10-33] 18 (04/15 0714) BP: (97-174)/(54-96) 174/96 (04/15 0714) SpO2:  [94 %-100 %] 100 % (04/15 0714)  Intake/Output from previous day: 04/14 0701 - 04/15 0700 In: -  Out: 250 [Urine:250] Intake/Output this shift: No intake/output data recorded. Nutritional status:  Diet Order            Diet NPO time specified  Diet effective now              Neurologic Exam: Mental Status: Alert.  Speech fluent but dysarthric.  Able to follow commands without difficulty. Cranial Nerves: II: Visual fields grossly normal, pupils equal, round, reactive to light and accommodation III,IV, VI: ptosis not present, extra-ocular motions intact bilaterally V,VII: right facial droop, facial light touch sensation normal bilaterally VIII: hearing normal bilaterally IX,X: gag reflex present XI: bilateral shoulder shrug XII: midline tongue extension Motor: Right :  Upper extremity   4/5                                      Left:     Upper extremity   5/5             Lower extremity   3-/5                                                 Lower extremity   3/5   Lab Results: Basic Metabolic Panel: Recent Labs  Lab 04/14/2020 0044  NA 140  K 4.6  CL 109  CO2 22  GLUCOSE 158*  BUN 50*  CREATININE 2.24*  CALCIUM 8.4*    Liver Function Tests: Recent Labs  Lab 03/27/2020 0044  AST 22  ALT 17  ALKPHOS 95  BILITOT 0.6  PROT 7.0  ALBUMIN 2.9*   No results for input(s): LIPASE, AMYLASE in the last 168 hours. No results for input(s): AMMONIA in the last 168 hours.  CBC: Recent Labs  Lab 03/25/2020 0044   WBC 14.3*  NEUTROABS 9.0*  HGB 13.5  HCT 41.2  MCV 98.3  PLT 221    Cardiac Enzymes: No results for input(s): CKTOTAL, CKMB, CKMBINDEX, TROPONINI in the last 168 hours.  Lipid Panel: Recent Labs  Lab 04/05/2020 0044  CHOL 128  TRIG 85  HDL 36*  CHOLHDL 3.6  VLDL 17  LDLCALC 75    CBG: Recent Labs  Lab 04/13/2020 0803 03/25/2020 1224 04/13/2020 1800 04/10/2020 2231 03/30/20 0716  GLUCAP 113* 104* 104* 135* 137*    Microbiology: Results for orders placed or performed during the hospital encounter of 04/11/2020  Respiratory Panel by RT PCR (Flu A&B, Covid) - Nasopharyngeal Swab     Status: None   Collection Time: 03/24/2020 12:52 AM   Specimen: Nasopharyngeal Swab  Result Value Ref Range Status   SARS Coronavirus  2 by RT PCR NEGATIVE NEGATIVE Final    Comment: (NOTE) SARS-CoV-2 target nucleic acids are NOT DETECTED. The SARS-CoV-2 RNA is generally detectable in upper respiratoy specimens during the acute phase of infection. The lowest concentration of SARS-CoV-2 viral copies this assay can detect is 131 copies/mL. A negative result does not preclude SARS-Cov-2 infection and should not be used as the sole basis for treatment or other patient management decisions. A negative result may occur with  improper specimen collection/handling, submission of specimen other than nasopharyngeal swab, presence of viral mutation(s) within the areas targeted by this assay, and inadequate number of viral copies (<131 copies/mL). A negative result must be combined with clinical observations, patient history, and epidemiological information. The expected result is Negative. Fact Sheet for Patients:  PinkCheek.be Fact Sheet for Healthcare Providers:  GravelBags.it This test is not yet ap proved or cleared by the [place] FDA and  has been authorized for detection and/or diagnosis of SARS-CoV-2 by FDA under an Emergency Use  Authorization (EUA). This EUA will remain  in effect (meaning this test can be used) for the duration of the COVID-19 declaration under Section 564(b)(1) of the Act, 21 U.S.C. section 360bbb-3(b)(1), unless the authorization is terminated or revoked sooner.    Influenza A by PCR NEGATIVE NEGATIVE Final   Influenza B by PCR NEGATIVE NEGATIVE Final    Comment: (NOTE) The Xpert Xpress SARS-CoV-2/FLU/RSV assay is intended as an aid in  the diagnosis of influenza from Nasopharyngeal swab specimens and  should not be used as a sole basis for treatment. Nasal washings and  aspirates are unacceptable for Xpert Xpress SARS-CoV-2/FLU/RSV  testing. Fact Sheet for Patients: PinkCheek.be Fact Sheet for Healthcare Providers: GravelBags.it This test is not yet approved or cleared by the [place] FDA and  has been authorized for detection and/or diagnosis of SARS-CoV-2 by  FDA under an Emergency Use Authorization (EUA). This EUA will remain  in effect (meaning this test can be used) for the duration of the  Covid-19 declaration under Section 564(b)(1) of the Act, 21  U.S.C. section 360bbb-3(b)(1), unless the authorization is  terminated or revoked. Performed at [facility], [address] [place], [place] [address]     Coagulation Studies: Recent Labs    03/22/2020 0142 03/30/20 0501  LABPROT 24.5* 24.4*  INR 2.2* 2.2*    Imaging: CT HEAD WO CONTRAST  Result Date: 03/30/2020 CLINICAL DATA:  Stroke follow-up EXAM: CT HEAD WITHOUT CONTRAST TECHNIQUE: Contiguous axial images were obtained from the base of the skull through the vertex without intravenous contrast. COMPARISON:  Yesterday FINDINGS: Brain: Patient's left-sided subcortical infarct is subtle compared to prior brain MRI. No hemorrhagic conversion or evidence of progression. Generalized cerebral volume loss in keeping with age. No hydrocephalus or masslike  finding. Vascular: Atherosclerosis and tortuosity of intracranial vessels. Skull: Normal. Negative for fracture or focal lesion. Sinuses/Orbits: No acute finding IMPRESSION: No hemorrhagic conversion or evidence of infarct progression. Electronically Signed   By: Monte Fantasia M.D.   On: 03/30/2020 06:50   CT HEAD WO CONTRAST  Result Date: 03/28/2020 CLINICAL DATA:  Right-sided weakness with facial droop EXAM: CT HEAD WITHOUT CONTRAST TECHNIQUE: Contiguous axial images were obtained from the base of the skull through the vertex without intravenous contrast. COMPARISON:  MRI 11/12/2019, CT brain 11/12/2019 FINDINGS: Brain: No acute territorial infarction, hemorrhage or intracranial mass. Mild atrophy and chronic small vessel ischemic change of the white matter. Stable ventricle size. Vascular: No hyperdense vessels.  Carotid vascular  calcification. Skull: Normal. Negative for fracture or focal lesion. Sinuses/Orbits: No acute finding. Other: None IMPRESSION: 1. No CT evidence for acute intracranial abnormality. 2. Atrophy and chronic small vessel ischemic change of the white matter Electronically Signed   By: Donavan Foil M.D.   On: 04/09/2020 01:44   MR BRAIN WO CONTRAST  Result Date: 04/13/2020 CLINICAL DATA:  Acute onset of right-sided weakness with facial droop that has nearly completely resolved EXAM: MRI HEAD WITHOUT CONTRAST TECHNIQUE: Multiplanar, multiecho pulse sequences of the brain and surrounding structures were obtained without intravenous contrast. COMPARISON:  Head CT from earlier today. FINDINGS: Brain: Elongated patchy restricted diffusion at the left putamen and corona radiata. Chronic blood products along the right cerebral convexity. There is subtle subdural collection/dural thickening along the right frontal parietal convexity which is not dense by CT, 2 mm in maximal thickness. This does not follow CSF on FLAIR imaging. No hydrocephalus or masslike finding. Vascular: Preserved flow  voids. Intracranial vessels are tortuous, often attributed to chronic hypertension. Skull and upper cervical spine: Normal marrow signal. Degenerative facet spurring in the upper cervical spine. Sinuses/Orbits: Partial bilateral mastoid opacification. Mucosal thickening in the left sphenoid sinus. Bilateral cataract resection. Other: 3.7 cm suboccipital lipoma in the midline posterior neck. IMPRESSION: 1. Small acute perforator infarction at the left basal ganglia and corona radiata. 2. Thin (2 mm) subdural collection or dural thickening along the right frontal parietal convexity, likely subacute hematoma. Electronically Signed   By: Monte Fantasia M.D.   On: 04/01/2020 06:21   US Carotid Bilateral (at [facility] and [facility] only)  Result Date: 03/22/2020 CLINICAL DATA:  Stroke symptoms, diabetes EXAM: BILATERAL CAROTID DUPLEX ULTRASOUND TECHNIQUE: Pearline Cables scale imaging, color Doppler and duplex ultrasound were performed of bilateral carotid and vertebral arteries in the neck. COMPARISON:  11/13/2019 FINDINGS: Criteria: Quantification of carotid stenosis is based on velocity parameters that correlate the residual internal carotid diameter with NASCET-based stenosis levels, using the diameter of the distal internal carotid lumen as the denominator for stenosis measurement. The following velocity measurements were obtained: RIGHT ICA: 108/21 cm/sec CCA: 502/77 cm/sec SYSTOLIC ICA/CCA RATIO:  0.8 ECA: 136 cm/sec LEFT ICA: 89/12 cm/sec CCA: 41/2 cm/sec SYSTOLIC ICA/CCA RATIO:  0.9 ECA: 86 cm/sec RIGHT CAROTID ARTERY: Moderate carotid atherosclerosis with echogenic shadowing calcification at the bifurcation. Slight tortuosity noted. Despite this, no hemodynamically significant right ICA stenosis, turbulent flow, or spectral broadening. Degree of stenosis estimated at less than 50%. RIGHT VERTEBRAL ARTERY:  Antegrade LEFT CAROTID ARTERY: Similar moderate atherosclerosis and tortuosity. No hemodynamically significant left ICA  stenosis velocity elevation, turbulent flow. Degree of narrowing also less than 50%. LEFT VERTEBRAL ARTERY:  Antegrade IMPRESSION: Moderate bilateral carotid atherosclerosis with no hemodynamically significant ICA stenosis degree of narrowing estimated at less than 50% bilaterally by ultrasound criteria. Overall stable exam. Patent antegrade vertebral flow bilaterally Electronically Signed   By: Jerilynn Mages.  Shick M.D.   On: 03/18/2020 16:16   ECHOCARDIOGRAM COMPLETE  Result Date: 04/02/2020    ECHOCARDIOGRAM REPORT   Patient Name:   Richard [name] Date of Exam: 04/01/2020 Medical Rec #:  [id number]        Height:       72.0 in Accession #:    [id number]       Weight:       220.0 lb Date of Birth:  Jul 05, 1931        BSA:          2.219 m Patient Age:    25 years  BP:           106/84 mmHg Patient Gender: M                HR:           58 bpm. Exam Location:  [facility] Procedure: 2D Echo, Color Doppler, Cardiac Doppler and Intracardiac            Opacification Agent Indications:     I163.9 Stroke  History:         Patient has prior history of Echocardiogram examinations, most                  recent 11/13/2019. CKD, Arrythmias:Atrial Fibrillation; Risk                  Factors:Hypertension and Diabetes. Cancer.  Sonographer:     Charmayne Sheer RDCS (AE) Referring Phys:  [id number] Athena Masse Diagnosing Phys: Bartholome Bill MD  Sonographer Comments: Suboptimal apical window and suboptimal subcostal window. IMPRESSIONS  1. Left ventricular ejection fraction, by estimation, is 60 to 65%. The left ventricle has normal function. The left ventricle has no regional wall motion abnormalities. There is moderate left ventricular hypertrophy. Left ventricular diastolic parameters are consistent with Grade I diastolic dysfunction (impaired relaxation).  2. Right ventricular systolic function is normal. The right ventricular size is normal.  3. Left atrial size was mildly dilated.  4. The mitral valve is degenerative. Trivial mitral  valve regurgitation.  5. The aortic valve was not well visualized. Aortic valve regurgitation is mild to moderate. FINDINGS  Left Ventricle: Left ventricular ejection fraction, by estimation, is 60 to 65%. The left ventricle has normal function. The left ventricle has no regional wall motion abnormalities. Definity contrast agent was given IV to delineate the left ventricular  endocardial borders. The left ventricular internal cavity size was normal in size. There is moderate left ventricular hypertrophy. Left ventricular diastolic parameters are consistent with Grade I diastolic dysfunction (impaired relaxation). Right Ventricle: The right ventricular size is normal. No increase in right ventricular wall thickness. Right ventricular systolic function is normal. Left Atrium: Left atrial size was mildly dilated. Right Atrium: Right atrial size was normal in size. Pericardium: There is no evidence of pericardial effusion. Mitral Valve: The mitral valve is degenerative in appearance. Trivial mitral valve regurgitation. MV peak gradient, 3.3 mmHg. The mean mitral valve gradient is 2.0 mmHg. Tricuspid Valve: The tricuspid valve is not well visualized. Tricuspid valve regurgitation is trivial. Aortic Valve: The aortic valve was not well visualized. Aortic valve regurgitation is mild to moderate. Aortic valve mean gradient measures 3.0 mmHg. Aortic valve peak gradient measures 6.0 mmHg. Aortic valve area, by VTI measures 2.13 cm. Pulmonic Valve: The pulmonic valve was not well visualized. Pulmonic valve regurgitation is trivial. Aorta: The aortic root was not well visualized. IAS/Shunts: The interatrial septum was not assessed.  LEFT VENTRICLE PLAX 2D LVIDd:         4.27 cm LVIDs:         2.84 cm LV PW:         1.18 cm LV IVS:        0.89 cm LVOT diam:     2.40 cm LV SV:         60 LV SV Index:   27 LVOT Area:     4.52 cm  LEFT ATRIUM           Index LA diam:      4.40 cm 1.98  cm/m LA Vol (A4C): 62.4 ml 28.13 ml/m   AORTIC VALVE                   PULMONIC VALVE AV Area (Vmax):    2.37 cm    PV Vmax:       0.83 m/s AV Area (Vmean):   2.48 cm    PV Vmean:      50.800 cm/s AV Area (VTI):     2.13 cm    PV VTI:        0.181 m AV Vmax:           122.00 cm/s PV Peak grad:  2.7 mmHg AV Vmean:          78.900 cm/s PV Mean grad:  1.0 mmHg AV VTI:            0.282 m AV Peak Grad:      6.0 mmHg AV Mean Grad:      3.0 mmHg LVOT Vmax:         64.00 cm/s LVOT Vmean:        43.300 cm/s LVOT VTI:          0.133 m LVOT/AV VTI ratio: 0.47  AORTA Ao Root diam: 3.90 cm MITRAL VALVE MV Area (PHT): 2.53 cm    SHUNTS MV Peak grad:  3.3 mmHg    Systemic VTI:  0.13 m MV Mean grad:  2.0 mmHg    Systemic Diam: 2.40 cm MV Vmax:       0.90 m/s MV Vmean:      57.3 cm/s MV Decel Time: 300 msec MV E velocity: 73.87 cm/s Bartholome Bill MD Electronically signed by Bartholome Bill MD Signature Date/Time: 03/30/2020/10:50:12 AM    Final     Medications:  I have reviewed the patient's current medications. Scheduled: .  stroke: mapping our early stages of recovery book   Does not apply Once  . aspirin  81 mg Oral Daily  . atorvastatin  40 mg Oral Daily  . insulin aspart  0-15 Units Subcutaneous TID WC  . insulin aspart  0-5 Units Subcutaneous QHS  . lisinopril  10 mg Oral Daily  . metoprolol tartrate  2.5 mg Intravenous Q6H  . warfarin  5 mg Oral q1600  . Warfarin - Pharmacist Dosing Inpatient   Does not apply q1600    Assessment/Plan: 86 y.o. male with medical history significant forparoxysmal A. 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 Per patient has had some improvement since onset of symptoms but has not returned to baseline.  MRI of the brain personally  reviewed and shows an acute left basal ganglia infarct with possible small right, subacute SDH.  Although location more typical for a small vessel etiology, due to history of afib and hypercoagulable syndrome embolic etiology is more likely.  Patient on Coumadin with therapeutic INR. Carotid dopplers show no evidence of hemodynamically significant stenosis. Echocardiogram shows no cardiac source of emboli with an EF of 60-65%.  A1c 6.7, LDL 75. Repeat head CT performed this AM and shows no evidence of hemorrhage.      Plan: 1. Prophylactic therapy-Continue Coumadin 2. Telemetry monitoring 3. Frequent neuro checks 4. Aggressive lipid management with target LDL<70.  No further neurologic intervention is recommended  at this time.  If further questions arise, please call or page at that time.  Thank you for allowing neurology to participate in the care of this patient.    LOS: 0 days   Alexis Goodell, MD Neurology [phone] 03/30/2020  10:25 AM

## 2020-03-30 NOTE — Progress Notes (Addendum)
NG tube advanced 5 cm from 65-70 per radiology recommendations. Patient tolerated very well. Order placed for placement verification.   0445 Per radiology, NG tube coiled post advancing to 70 cm. Writer assisted provider on call E. Ouma NP in removing NG tube and replacing with another. 38 Fr [place] sump tube inserted into R nostril. Provider verified placement at the bedside. KUB order place for confirmation. Patient tolerated will with no distress. Oral care provided.

## 2020-03-30 NOTE — Progress Notes (Signed)
OT Cancellation Note  Patient Details Name: Richard [name] MRN: [id number] DOB: 10-11-1931   Cancelled Treatment:    Reason Eval/Treat Not Completed: Patient at procedure or test/ unavailable. OT attempted to see pt for evaluation this am. Upon arrival to unit, pt noted to be off the floor. Will continue to follow and initiate services as available/pt medically appropriate for therapy.   Shara Blazing, M.S., OTR/L Ascom: [phone] 03/30/20, 8:54 AM

## 2020-03-30 NOTE — Progress Notes (Signed)
PT Cancellation Note  Patient Details Name: Richard [name] MRN: [id number] DOB: 02/14/1931   Cancelled Treatment:    Reason Eval/Treat Not Completed: Patient at procedure or test/unavailable(2 attempts to see patient this date, pt unavailable both times. Will continue to follow, attempt evaluation again at later date/time.)  10:11 AM, 03/30/20 Etta Grandchild, PT, DPT Physical Therapist - [facility]  [phone] ([place])    [name] C 03/30/2020, 10:10 AM
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
## 2020-03-30 NOTE — Evaluation (Signed)
Occupational Therapy Evaluation Patient Details Name:  Richard [name] MRN: [id number] DOB: Apr 18, 1931 Today's Date: 03/30/2020    History of Present Illness Richard [name] is an 65yoM who comes to [facility] 4/14 c acute onset right hemi weakness. Pt reports inability to raise the right arm or move the right leg as well as right-sided facial droop with drooling and slurred speech. By time of arrival, symptoms mostly resolved. At baseline patient ambulates with a cane. 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 MRI shows: Small acute perforator infarction at the left basal ganglia and corona radiata. Thin (2 mm) subdural collection or dural thickening along the right frontal parietal convexity, likely subacute hematoma.   Clinical Impression   Mr. Richard [name] was seen for OT evaluation this date. Pt received semi-supine in bed, appears to recognize therapist from past hospitalizations and begins to demonstrate UE exercises once therapist enters the room. Prior to hospital admission, pt required assistance for BADL management. He indicates that he had assistance for bathing, but that he was able to dress himself. Unsure of validity of information provided as pt also states he is living at an assisted living facility, but chart review suggests he is living at home with 24/7 assist.  Currently pt demonstrates impairments as described below (See OT problem list) which functionally limit his ability to perform ADL/self-care tasks. Pt currently requires MAX A +2 for bed-level peri-care and LB bathing this date. He does attempt STS with therapist and CNA providing +2 assist. Pt is able to come to full stand with MAX A +2 and use of RW. Pt fatigues quickly in standing and is unable to attempt further functional mobility.  Pt would  benefit from skilled OT services to address noted impairments and functional limitations (see below for any additional details) in order to maximize safety and independence while minimizing falls risk and caregiver burden. Upon hospital discharge, recommend STR to maximize pt safety and return to PLOF.      Follow Up Recommendations  SNF    Equipment Recommendations  Other (comment)(TBD)    Recommendations for Other Services       Precautions / Restrictions Precautions Precautions: Fall Precaution Comments: High Fall Restrictions Weight Bearing Restrictions: No      Mobility Bed Mobility Overal bed mobility: Needs Assistance Bed Mobility: Supine to Sit     Supine to sit: Max assist Sit to supine: Max assist   General bed mobility comments: Pt reports he cannot do this without heavy assistance; he knows this because he just finished doing it with OT and is now exhausted.  Transfers Overall transfer level: Needs assistance Equipment used: Rolling walker (2 wheeled) Transfers: Sit to/from Stand Sit to Stand: +2 physical assistance;Mod assist;Max assist         General transfer comment: Pt fatigues quickly with STS attempts and becomes SOB. HR and spO2 monitored during mobility attempts and remain WFLs.    Balance Overall balance assessment: Needs assistance Sitting-balance support: Feet supported;Single extremity supported Sitting balance-Leahy Scale: Fair Sitting balance - Comments: Steady static sitting at EOB with 1 UE support, minimal R Lateral lean noted. Postural control: Right lateral lean Standing balance support: During functional activity;Bilateral upper extremity supported Standing balance-Leahy Scale: Poor Standing balance comment: Pt unable to come to full stand w/o physical assist from therapist.  ADL either performed or assessed with clinical judgement   ADL Overall ADL's : Needs assistance/impaired                                        General ADL Comments: Pt is functionally limited by decreased functional use of his  RUE, generalized weakness, & decreased activity tolerance. He requires +2 assist for functional mobility. Max assist for LB ADL management. On this date he experiences multiple episodes of bowel and bladder incontinence and requires max assist +2 for bed level clean-up and linen change.     Vision         Perception     Praxis      Pertinent Vitals/Pain Pain Assessment: No/denies pain     Hand Dominance Right   Extremity/Trunk Assessment Upper Extremity Assessment Upper Extremity Assessment: RUE deficits/detail RUE Deficits / Details: Grips strong and equal but delayed; Rt elbow flexion 5/5, elbow extension 4/5; Rt shoulder flexion 3-/5 but can isometrically hold overhead when passively assisted.   Lower Extremity Assessment Lower Extremity Assessment: RLE deficits/detail RLE Deficits / Details: Gross limb extension (knee/hip combined) is 5/5 but 50% weaker than Left side when tested.       Communication Communication Communication: Expressive difficulties   Cognition Arousal/Alertness: Lethargic Behavior During Therapy: WFL for tasks assessed/performed Overall Cognitive Status: Difficult to assess                                 General Comments: tels me he is in the hospital and that the year is '21st'   General Comments  Pt generally incontinent of urine with dark colored foul smelling urine throughout session. Wets through multiple sets of linens.    Exercises Other Exercises Other Exercises: OT engages pt in functional transfer/STS attempts to support participation in LB bathing/toileting this date. Pt requires max A +2 for bed level peri-care after multiple episodes of incontenence.   Shoulder Instructions      Home Living Family/patient expects to be discharged to:: Private residence Living Arrangements: Alone Available  Help at Discharge: Personal care attendant;Available 24 hours/day Type of Home: House Home Access: Stairs to enter [employer] of Steps: 1 Entrance Stairs-Rails: None Home Layout: One level     Bathroom Shower/Tub: Occupational psychologist: Standard Bathroom Accessibility: Yes   Home Equipment: Environmental consultant - 2 wheels;Walker - 4 wheels   Additional Comments: Reports there are grab bars all around home, even in garage.      Prior Functioning/Environment Level of Independence: Needs assistance  Gait / Transfers Assistance Needed: Needs assistance from son for transfers, able to ambulate to bathroom/household distances with caregiver and son with [phone]; pt sleeps in recliner at baseline ADL's / Homemaking Assistance Needed: Pt reports needing help with bathing and dressing at baseline.   Comments: Pt noted with expressive difficulties. May be unreliable historian at this time. tells this Pryor Curia he is no longer living at home, but lives in an assisted living facility. Chart review indicates pt lives at home with full time care.        OT Problem List: Decreased strength;Decreased coordination;Decreased activity tolerance;Decreased safety awareness;Decreased range of motion;Impaired balance (sitting and/or standing);Decreased knowledge of use of DME or AE;Decreased knowledge of precautions      OT Treatment/Interventions: Self-care/ADL training;Therapeutic exercise;Therapeutic activities;DME and/or AE instruction;Patient/family  education;Balance training    OT Goals(Current goals can be found in the care plan section) Acute Rehab OT Goals Patient Stated Goal: return to baseline PLOF OT Goal Formulation: With patient Time For Goal Achievement: 04/13/20 Potential to Achieve Goals: Good ADL Goals Pt Will Perform Grooming: sitting;with min guard assist;with adaptive equipment(LRAD PRN for improved safety and fxl independence.) Pt Will Transfer to Toilet: bedside  commode;ambulating;with min assist(LRAD PRN for improved safety and fxl independence.) Pt Will Perform Toileting - Clothing Manipulation and hygiene: sit to/from stand;with min assist;with adaptive equipment(LRAD PRN for improved safety and fxl independence.)  OT Frequency: Min 2X/week   Barriers to D/C: Inaccessible home environment          Co-evaluation              AM-PAC OT "6 Clicks" Daily Activity     Outcome Measure Help from another person eating meals?: A Little Help from another person taking care of personal grooming?: A Little Help from another person toileting, which includes using toliet, bedpan, or urinal?: A Lot Help from another person bathing (including washing, rinsing, drying)?: A Lot Help from another person to put on and taking off regular upper body clothing?: A Lot Help from another person to put on and taking off regular lower body clothing?: A Lot 6 Click Score: 14   End of Session Equipment Utilized During Treatment: Gait belt;Rolling walker  Activity Tolerance: Patient tolerated treatment well Patient left: in bed;with call bell/phone within reach;with nursing/sitter in room  OT Visit Diagnosis: Other abnormalities of gait and mobility (R26.89);Hemiplegia and hemiparesis Hemiplegia - Right/Left: Right Hemiplegia - dominant/non-dominant: Dominant Hemiplegia - caused by: Cerebral infarction                Time: 2595-6387 OT Time Calculation (min): 44 min Charges:  OT General Charges $OT Visit: 1 Visit OT Evaluation $OT Eval Moderate Complexity: 1 Mod OT Treatments $Self Care/Home Management : 23-37 mins  Shara Blazing, M.S., OTR/L Ascom: [phone] 03/30/20, 1:16 PM

## 2020-03-30 NOTE — Progress Notes (Signed)
ANTICOAGULATION CONSULT NOTE - Follow Up Consult  Pharmacy Consult for Warfarin Indication: atrial fibrillation and VTE treatment  No Known Allergies  Patient Measurements: Height: 6' (182.9 cm) Weight: 99.8 kg (220 lb) IBW/kg (Calculated) : 77.6 Heparin Dosing Weight:    Vital Signs: Temp: 98.1 F (36.7 C) (04/15 1238) Temp Source: Oral (04/15 1238) BP: 121/64 (04/15 1238) Pulse Rate: 63 (04/15 1238)  Labs: Recent Labs    04/14/2020 0044 03/18/2020 0142 03/30/20 0501  HGB 13.5  --   --   HCT 41.2  --   --   PLT 221  --   --   APTT  --  40*  --   LABPROT  --  24.5* 24.4*  INR  --  2.2* 2.2*  CREATININE 2.24*  --   --     Estimated Creatinine Clearance: 29 mL/min (A) (by C-G formula based on SCr of 2.24 mg/dL (H)).  Assessment: Patient is a 86yo male admitted for CVA. Patient has a h/o Factor V Leiden deficiency, PE, afib. Patient was taking Warfarin prior to admission. Pharmacy is consulted to manage Warfarin therapy.  Home Dose: Warfarin 5mg  daily  Date      INR     Dose 4/14        2.2       Not given (dysphagia, NPO) 4/15        2.2  Goal of Therapy:  INR 2-3 Monitor platelets by anticoagulation protocol: Yes   Plan:  Patient is still therapeutic on current regimen. Patient did not receive Warfarin dose on 4/14 due to dysphagia (patient did not pass bedside swallow eval). Will continue home dose of Warfarin 5mg  daily, expect that INR may drop some from missed dose. If patient continues to be NPO will need to switch Warfarin to a parenteral anticoagulant. Daily INR checks.  Paulina Fusi, PharmD, BCPS 03/30/2020 3:15 PM

## 2020-03-31 ENCOUNTER — Inpatient Hospital Stay: Payer: Medicare Other

## 2020-03-31 DIAGNOSIS — E440 Moderate protein-calorie malnutrition: Secondary | ICD-10-CM | POA: Diagnosis not present

## 2020-03-31 DIAGNOSIS — Z7901 Long term (current) use of anticoagulants: Secondary | ICD-10-CM | POA: Diagnosis not present

## 2020-03-31 LAB — C DIFFICILE QUICK SCREEN W PCR REFLEX
C Diff antigen: NEGATIVE
C Diff interpretation: NOT DETECTED
C Diff toxin: NEGATIVE

## 2020-03-31 LAB — PROTIME-INR
INR: 2.7 — ABNORMAL HIGH (ref 0.8–1.2)
Prothrombin Time: 28.4 seconds — ABNORMAL HIGH (ref 11.4–15.2)

## 2020-03-31 LAB — GLUCOSE, CAPILLARY
Glucose-Capillary: 189 mg/dL — ABNORMAL HIGH (ref 70–99)
Glucose-Capillary: 150 mg/dL — ABNORMAL HIGH (ref 70–99)
Glucose-Capillary: 151 mg/dL — ABNORMAL HIGH (ref 70–99)

## 2020-03-31 MED ORDER — JEVITY 1.5 CAL/FIBER PO LIQD
1000.0000 mL | ORAL | Status: DC
  Administered 2020-03-31 – 2020-04-04 (×5): 1000 mL via ORAL

## 2020-03-31 MED ORDER — METOPROLOL TARTRATE 25 MG PO TABS
12.5000 mg | ORAL_TABLET | Freq: Two times a day (BID) | ORAL | Status: DC
  Administered 2020-03-31 – 2020-04-03 (×3): 12.5 mg via ORAL
  Filled 2020-03-31 (×4): qty 1

## 2020-03-31 MED ORDER — PRO-STAT SUGAR FREE PO LIQD
30.0000 mL | Freq: Two times a day (BID) | ORAL | Status: DC
  Administered 2020-03-31 – 2020-04-05 (×8): 30 mL

## 2020-03-31 MED ORDER — COLCHICINE 0.6 MG PO TABS
0.6000 mg | ORAL_TABLET | Freq: Every day | ORAL | Status: DC
  Administered 2020-03-31: 15:00:00 0.6 mg via ORAL
  Filled 2020-03-31 (×2): qty 1

## 2020-03-31 MED ORDER — FEBUXOSTAT 40 MG PO TABS
40.0000 mg | ORAL_TABLET | ORAL | Status: DC
  Administered 2020-03-31 – 2020-04-04 (×2): 40 mg via ORAL
  Filled 2020-03-31 (×3): qty 1

## 2020-03-31 MED ORDER — VITAMIN D 25 MCG (1000 UNIT) PO TABS
2000.0000 [IU] | ORAL_TABLET | Freq: Every day | ORAL | Status: DC
  Administered 2020-03-31 – 2020-04-05 (×5): 2000 [IU] via ORAL
  Filled 2020-03-31 (×5): qty 2

## 2020-03-31 MED ORDER — DIPHENOXYLATE-ATROPINE 2.5-0.025 MG/5ML PO LIQD
5.0000 mL | Freq: Four times a day (QID) | ORAL | Status: DC | PRN
  Filled 2020-03-31: qty 5

## 2020-03-31 MED ORDER — FREE WATER
150.0000 mL | Status: DC
  Administered 2020-03-31 – 2020-04-05 (×21): 150 mL

## 2020-03-31 NOTE — Progress Notes (Addendum)
Progress Note    Richard [name]  IRS:[id number] DOB: 08-06-1931  DOA: 04/14/2020 PCP: [name], MD      Brief Narrative:    Medical records reviewed and are as summarized below:  Richard [name] is an 86 y.o. male  with medical history significant for paroxysmal A. 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       Assessment/Plan:   Principal Problem:   Acute CVA (cerebrovascular accident) ([place]) Active Problems:   PAF (paroxysmal atrial fibrillation) (HCC)   HTN (hypertension)   CKD (chronic kidney disease) stage 4, GFR 15-29 ml/min (HCC)   Hemiparesis affecting right side as late effect of cerebrovascular accident (CVA) ([place])   Type 2 diabetes mellitus with diabetic peripheral angiopathy without gangrene (HCC)   Neurogenic bladder   Chronic anticoagulation   Pressure injury of buttock, stage 1, left   Malnutrition of moderate degree    Acute CVA (cerebrovascular accident) ([place]) with right hemiparesis -MRI brain showed small acute infarct in the left basal ganglia and corona radiata.  Repeat CT head did not show any evidence of intracranial bleed. 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 -PT and OT recommend discharge to SNF  Dysphagia/moderate protein calorie malnutrition -Patient failed swallow evaluation.  Keep NPO.  NG tube was placed by radiologist.  He has been started on enteral nutrition.  Follow-up with dietitian.   Monitor electrolytes.  Discontinue IV fluids  Diarrhea C. difficile toxin negative.  Lomotil as needed for diarrhea.  Right forearm swelling likely from IV infiltration Jasmine, RN, has been notified to remove peripheral IV cannula    PAF (paroxysmal atrial fibrillation) (HCC) -Continue Coumadin and metoprolol    HTN (hypertension) -Continue lisinopril and metoprolol    CKD (chronic kidney disease) stage 4, GFR 15-29 ml/min (HCC) -Renal function at baseline      Type 2 diabetes mellitus with diabetic peripheral angiopathy without gangrene ([place]) -Sliding scale insulin coverage    Neurogenic bladder -Can continue intermittent self-catheterization as needed.    Chronic anticoagulation History of PE History of factor V Leiden deficiency -Pharmacy consult for Coumadin  Stage I left buttock decubitus ulcer Continue local wound care   Body mass index is 25.44 kg/m.    Pressure Injury 03/30/20 Buttocks Left Stage 1 -  Intact skin with non-blanchable redness of a localized area usually over a bony prominence. Moisture associated damage (Active)  03/30/20 0036  Location: Buttocks  Location Orientation: Left  Staging: Stage 1 -  Intact skin with non-blanchable redness of a localized area usually over a bony prominence.  Wound Description (Comments): Moisture associated damage  Present on Admission: Yes          Family Communication/Anticipated D/C date and plan/Code Status   DVT prophylaxis: Warfarin Code Status: DNR Family Communication: Plan discussed with patient Disposition Plan:    Status is: Inpatient  Remains inpatient appropriate because:Unsafe d/c plan   Dispo: The patient is from: Home  Anticipated d/c is to: SNF              Anticipated d/c date is: > 3 days              Patient currently is not medically stable to d/c.            Subjective:   C/o general weakness. He had diarrhea last night but none this morning. No  vomiting, abdominal pain, shortness of breath or chest pain   Objective:    Vitals:   03/31/20 0019 03/31/20 0532 03/31/20 0931 03/31/20 1149  BP:  132/76  (!) 155/66  Pulse:  75  67  Resp:    18  Temp: 98.8 F (37.1 C) (!) 97.4 F (36.3 C)  97.6 F (36.4 C)  TempSrc: Oral Oral  Oral  SpO2:  99%  99%  Weight:   85.1 kg   Height:        Intake/Output Summary (Last 24 hours) at 03/31/2020 1351 Last data filed at 03/31/2020 0500 Gross per 24 hour  Intake 567.77 ml  Output -  Net 567.77 ml   Filed Weights   03/25/2020 0035 03/31/20 0931  Weight: 99.8 kg 85.1 kg    Exam:  GEN: NAD SKIN: No rash.  Stage I left buttock decubitus ulcer present on admission EYES: EOMI ENT: MMM CV: RRR PULM: CTA B ABD: soft, ND, NT, +BS CNS: AAO x 3, right facial droop, power in right upper extremity has improved to 4/5 and right lower extremity 3/5. EXT: Right forearm swelling near peripheral IV site     Data Reviewed:   I have personally reviewed following labs and imaging studies:  Labs: Labs show the following:   Basic Metabolic Panel: Recent Labs  Lab 04/06/2020 0044  NA 140  K 4.6  CL 109  CO2 22  GLUCOSE 158*  BUN 50*  CREATININE 2.24*  CALCIUM 8.4*   GFR Estimated Creatinine Clearance: 26 mL/min (A) (by C-G formula based on SCr of 2.24 mg/dL (H)). Liver Function Tests: Recent Labs  Lab 04/04/2020 0044  AST 22  ALT 17  ALKPHOS 95  BILITOT 0.6  PROT 7.0  ALBUMIN 2.9*   No results for input(s): LIPASE, AMYLASE in the last 168 hours. No results for input(s): AMMONIA in the last 168 hours. Coagulation profile Recent Labs  Lab 04/02/2020 0142 03/30/20 0501 03/31/20 0547  INR 2.2* 2.2* 2.7*    CBC: Recent Labs  Lab 03/27/2020 0044  WBC 14.3*  NEUTROABS 9.0*  HGB 13.5  HCT 41.2  MCV 98.3  PLT 221   Cardiac Enzymes: No results for input(s): CKTOTAL, CKMB, CKMBINDEX, TROPONINI in the last 168 hours. BNP (last 3 results) No results for input(s): PROBNP  in the last 8760 hours. CBG: Recent Labs  Lab 03/30/20 1213 03/30/20 1655 03/30/20 2202 03/31/20 0902 03/31/20 1150  GLUCAP 154* 142* 163* 189* 150*   D-Dimer: No results for input(s): DDIMER in the last 72 hours. Hgb A1c: Recent Labs    04/13/2020 0044  HGBA1C 6.7*   Lipid Profile: Recent Labs    04/03/2020 0044  CHOL 128  HDL 36*  LDLCALC 75  TRIG 85  CHOLHDL 3.6   Thyroid function studies: No results for input(s): TSH, T4TOTAL, T3FREE, THYROIDAB in the last 72 hours.  Invalid input(s): FREET3 Anemia work up: No results for input(s): VITAMINB12, FOLATE, FERRITIN, TIBC, IRON, RETICCTPCT in the last 72 hours. Sepsis Labs: Recent Labs  Lab 03/28/2020 0044  WBC 14.3*  Microbiology Recent Results (from the past 240 hour(s))  Respiratory Panel by RT PCR (Flu A&B, Covid) - Nasopharyngeal Swab     Status: None   Collection Time: 03/24/2020 12:52 AM   Specimen: Nasopharyngeal Swab  Result Value Ref Range Status   SARS Coronavirus 2 by RT PCR NEGATIVE NEGATIVE Final    Comment: (NOTE) SARS-CoV-2 target nucleic acids are NOT DETECTED. The SARS-CoV-2 RNA is generally detectable in upper respiratoy specimens during the acute phase of infection. The lowest concentration of SARS-CoV-2 viral copies this assay can detect is 131 copies/mL. A negative result does not preclude SARS-Cov-2 infection and should not be used as the sole basis for treatment or other patient management decisions. A negative result may occur with  improper specimen collection/handling, submission of specimen other than nasopharyngeal swab, presence of viral mutation(s) within the areas targeted by this assay, and inadequate number of viral copies (<131 copies/mL). A negative result must be combined with clinical observations, patient history, and epidemiological information. The expected result is Negative. Fact Sheet for Patients:  PinkCheek.be Fact Sheet for Healthcare  Providers:  GravelBags.it This test is not yet ap proved or cleared by the [place] FDA and  has been authorized for detection and/or diagnosis of SARS-CoV-2 by FDA under an Emergency Use Authorization (EUA). This EUA will remain  in effect (meaning this test can be used) for the duration of the COVID-19 declaration under Section 564(b)(1) of the Act, 21 U.S.C. section 360bbb-3(b)(1), unless the authorization is terminated or revoked sooner.    Influenza A by PCR NEGATIVE NEGATIVE Final   Influenza B by PCR NEGATIVE NEGATIVE Final    Comment: (NOTE) The Xpert Xpress SARS-CoV-2/FLU/RSV assay is intended as an aid in  the diagnosis of influenza from Nasopharyngeal swab specimens and  should not be used as a sole basis for treatment. Nasal washings and  aspirates are unacceptable for Xpert Xpress SARS-CoV-2/FLU/RSV  testing. Fact Sheet for Patients: PinkCheek.be Fact Sheet for Healthcare Providers: GravelBags.it This test is not yet approved or cleared by the [place] FDA and  has been authorized for detection and/or diagnosis of SARS-CoV-2 by  FDA under an Emergency Use Authorization (EUA). This EUA will remain  in effect (meaning this test can be used) for the duration of the  Covid-19 declaration under Section 564(b)(1) of the Act, 21  U.S.C. section 360bbb-3(b)(1), unless the authorization is  terminated or revoked. Performed at [facility], [place], [place] [address]   C Difficile Quick Screen w PCR reflex     Status: None   Collection Time: 03/31/20 10:55 AM   Specimen: STOOL  Result Value Ref Range Status   C Diff antigen NEGATIVE NEGATIVE Final   C Diff toxin NEGATIVE NEGATIVE Final   C Diff interpretation No C. difficile detected.  Final    Comment: Performed at [facility], [address] [place],  [address]    Procedures and  diagnostic studies:  DG Abd 1 View  Result Date: 03/31/2020 CLINICAL DATA:  Nasogastric tube placement EXAM: ABDOMEN - 1 VIEW COMPARISON:  Earlier today FINDINGS: The enteric tube tip is at the stomach. Visualized bowel gas pattern is normal, with oral contrast seen in the proximal colon. Reticulation at the lung bases without asymmetric density. IMPRESSION: The enteric tube tip is at the stomach. Electronically Signed   By: Monte Fantasia M.D.   On: 03/31/2020 05:45   DG Abd 1 View  Result Date: 03/31/2020 CLINICAL DATA:  NG tube placement EXAM: ABDOMEN - 1 VIEW COMPARISON:  03/30/2020 FINDINGS: Esophageal tube is folded upon itself over the gastric fundus. Mild residual contrast within the bowel. IMPRESSION: Enteric tube is looped back upon itself over the gastric fundus Electronically Signed   By: Donavan Foil M.D.   On: 03/31/2020 00:38   CT HEAD WO CONTRAST  Result Date: 03/30/2020 CLINICAL DATA:  Stroke follow-up EXAM: CT HEAD WITHOUT CONTRAST TECHNIQUE: Contiguous axial images were obtained from the base of the skull through the vertex without intravenous contrast. COMPARISON:  Yesterday FINDINGS: Brain: Patient's left-sided subcortical infarct is subtle compared to prior brain MRI. No hemorrhagic conversion or evidence of progression. Generalized cerebral volume loss in keeping with age. No hydrocephalus or masslike finding. Vascular: Atherosclerosis and tortuosity of intracranial vessels. Skull: Normal. Negative for fracture or focal lesion. Sinuses/Orbits: No acute finding IMPRESSION: No hemorrhagic conversion or evidence of infarct progression. Electronically Signed   By: Monte Fantasia M.D.   On: 03/30/2020 06:50   DG Abd Portable 1V  Result Date: 03/30/2020 CLINICAL DATA:  Nasogastric tube repositioning EXAM: PORTABLE ABDOMEN - 1 VIEW COMPARISON:  None. FINDINGS: Nasogastric tube tip is in the stomach with the side port at the gastroesophageal junction. There is no bowel dilatation or  air-fluid level to suggest bowel obstruction. No free air. Lung bases clear. IMPRESSION: Nasogastric tube tip in stomach with side port at gastroesophageal junction. Advise advancing nasogastric tube 4-5 cm. No bowel obstruction or free air. Electronically Signed   By: Lowella Grip III M.D.   On: 03/30/2020 19:08   DG Abd Portable 1V  Result Date: 03/30/2020 CLINICAL DATA:  Nasogastric tube placement EXAM: PORTABLE ABDOMEN - 1 VIEW COMPARISON:  None. FINDINGS: Nasogastric tube tip is at the gastroesophageal junction with the side port in the distal esophagus. There is contrast in distal small bowel. No bowel dilatation or air-fluid level to suggest bowel obstruction. No free air. Lung bases clear. IMPRESSION: Nasogastric tube tip at gastroesophageal junction with side port above the gastroesophageal junction. Advise advancing nasogastric tube approximately 7-8 cm to ensure that tip and side port are in the stomach. No bowel obstruction or free air evident. These results will be called to the ordering clinician or representative by the Radiologist Assistant, and communication documented in the PACS or [employer]. Electronically Signed   By: Lowella Grip III M.D.   On: 03/30/2020 18:02   DG Naso G Tube Plc W/Fl W/Rad  Result Date: 03/31/2020 CLINICAL DATA:  Evaluate NG tube. EXAM: NASO G TUBE PLACEMENT WITH FL AND WITH RAD CONTRAST:  None. FLUOROSCOPY TIME:  Fluoroscopy Time:  0 minutes 36 second Radiation Exposure Index (if provided by the fluoroscopic device): 0.9 mGy. Number of Acquired Spot Images: 1 COMPARISON:  KUB 03/31/2020. FINDINGS: NG tube tip is in the upper most portion of the stomach. Under fluoroscopic guidance NG tube was advanced. Its tip and side hole are coiled in the stomach. There no complications. IMPRESSION: Successful NG tube repositioning under fluoroscopic guidance. NG tube tip and side hole coiled in stomach. Electronically Signed   By: Marcello Moores  Register   On:  03/31/2020 08:18    Medications:   .  stroke: mapping our early stages of recovery book   Does not apply Once  . aspirin  81 mg Oral Daily  . atorvastatin  40 mg Oral Daily  . cholecalciferol  2,000 Units Oral Daily  . colchicine  0.6 mg Oral Daily  . febuxostat  40 mg Oral  QODAY  . feeding supplement (PRO-STAT SUGAR FREE 64)  30 mL Per Tube BID  . free water  150 mL Per Tube Q4H  . insulin aspart  0-15 Units Subcutaneous TID WC  . insulin aspart  0-5 Units Subcutaneous QHS  . lisinopril  10 mg Oral Daily  . metoprolol tartrate  12.5 mg Oral BID  . Warfarin - Pharmacist Dosing Inpatient   Does not apply q1600   Continuous Infusions: . feeding supplement (JEVITY 1.5 CAL/FIBER)       LOS: 1 day   Taisley Mordan  [facility]     03/31/2020, 1:51 PM

## 2020-03-31 NOTE — Progress Notes (Signed)
Physical Therapy Treatment Patient Details Name: Richard [name] MRN: [id number] DOB: 02-07-1931 Today's Date: 03/31/2020    History of Present Illness Richard [name] is an 26yoM who comes [facility] 4/14 c acute onset right hemi weakness. Pt reports inability to raise the right arm or move the right leg as well as right-sided facial droop with drooling and slurred speech. By time of arrival, symptoms mostly resolved. At baseline patient ambulates with a cane. 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 MRI shows: Small acute perforator infarction at the left basal ganglia and corona radiata. Thin (2 mm) subdural collection or dural thickening along the right frontal parietal convexity, likely subacute hematoma.    PT Comments    Pt in bed upon entry, remembers author from previous day. Pt agreeable to PT session, but declines offer for OOB as he does not feel well in general. Pt partakes in bed level exercises, mostly RLE and BUE. Pt still having significant RUE proximal weakness, but able to provide a fair amount of resistance with RLE. Hamstrings weakness on RLE makes it unable to self-stabilize Rt knee during bridging. Pt noted to have infiltrated IV access at right elbow, RN made aware, MD in room made a visual. Pt noted to have soiled the bed at end of session both of bowel and urine, RN made aware. Deferred OOB d/t on-going incontinence diarrhea and lethargy. RN setting up NG tube feeding at exit.    Follow Up Recommendations  SNF;Supervision/Assistance - 24 hour     Equipment Recommendations  None recommended by PT;Other (comment)(If pt goes home, he would need a hospital bed and mechnical lift, transport chair if not already one in the home.)    Recommendations for Other Services        Precautions / Restrictions Precautions Precautions: Fall Restrictions Weight Bearing Restrictions: No    Mobility  Bed Mobility               General bed mobility comments: deferred due to procedure and hygiene needs  Transfers                    Ambulation/Gait                 Stairs             Wheelchair Mobility    Modified Rankin (Stroke Patients Only)       Balance                                            Cognition Arousal/Alertness: Lethargic Behavior During Therapy: WFL for tasks assessed/performed(does alert with exercise) Overall Cognitive Status: Within Functional Limits for tasks assessed                                        Exercises Total Joint Exercises Bridges: AROM;Both;10 reps;Supine;Other (comment);Limitations Bridges Limitations: needs maxA to get legs into position; partial butt clearance. General Exercises - Upper Extremity Shoulder Flexion: AAROM;Both;10 reps;Other (comment);Limitations Shoulder Flexion Limitations: semi recumbent to overhead trapeze General Exercises - Lower Extremity Heel Slides: AAROM;Right;15 reps;Supine Hip ABduction/ADduction: AAROM;Right;15  reps;Supine Hip Flexion/Marching: AAROM;Right;15 reps;Supine Mini-Sqauts: Strengthening;Right;15 reps;Supine;Limitations Mini Squats Limitations: Moderate resistance provided    General Comments        Pertinent Vitals/Pain Pain Assessment: No/denies pain    Home Living                      Prior Function            PT Goals (current goals can now be found in the care plan section) Acute Rehab PT Goals Patient Stated Goal: return to baseline PLOF PT Goal Formulation: With patient Time For Goal Achievement: 04/13/20 Potential to Achieve Goals: Poor Progress towards PT goals: Progressing toward goals    Frequency    7X/week      PT Plan Current plan remains appropriate     Co-evaluation              AM-PAC PT "6 Clicks" Mobility   Outcome Measure  Help needed turning from your back to your side while in a flat bed without using bedrails?: A Lot Help needed moving from lying on your back to sitting on the side of a flat bed without using bedrails?: A Lot Help needed moving to and from a bed to a chair (including a wheelchair)?: A Lot Help needed standing up from a chair using your arms (e.g., wheelchair or bedside chair)?: A Lot Help needed to walk in hospital room?: A Lot Help needed climbing 3-5 steps with a railing? : Total 6 Click Score: 11    End of Session Equipment Utilized During Treatment: Gait belt Activity Tolerance: Patient tolerated treatment well;No increased pain;Patient limited by fatigue Patient left: in bed;with call bell/phone within reach;with bed alarm set Nurse Communication: Mobility status PT Visit Diagnosis: Difficulty in walking, not elsewhere classified (R26.2);Unsteadiness on feet (R26.81);Muscle weakness (generalized) (M62.81);Other abnormalities of gait and mobility (R26.89)     Time: 5102-5852 PT Time Calculation (min) (ACUTE ONLY): 25 min  Charges:  $Neuromuscular Re-education: 23-37 mins                     12:39 PM, 03/31/20 Etta Grandchild, PT, DPT Physical Therapist - [facility]  [phone] ([place])    Besan Ketchem C 03/31/2020, 12:34 PM

## 2020-03-31 NOTE — Progress Notes (Signed)
VAST consult to obtain IV access. Spoke with pt's nurse Jasmine via [facility]; requested she contact physician to inquire if IV Lopressor can be changed to NGT route since IVF's have been dc'd and pt has had 4 IV's in 2 days.

## 2020-03-31 NOTE — Progress Notes (Signed)
Initial Nutrition Assessment  DOCUMENTATION CODES:   Non-severe (moderate) malnutrition in context of chronic illness  INTERVENTION:  Initiate Jevity 1.5 Cal at 20 mL/hr and advance by 15 mL/hr every 12 hours to goal rate of 50 mL/hr. Also provide Pro-Stat 30 mL BID per tube. Goal regimen provides 2000 kcal, 107 grams of protein, 912 mL H2O daily.  Provide free water flush of 150 mL Q4hrs. Provides a total of 1812 mL H2O daily including water in tube feeding.  Monitor magnesium, potassium, and phosphorus daily for at least 3 days, MD to replete as needed, as pt is at risk for refeeding syndrome given moderate malnutrition.  NUTRITION DIAGNOSIS:   Moderate Malnutrition related to chronic illness(CKD stage IV, inadequate oral intake) as evidenced by moderate fat depletion, moderate muscle depletion.  GOAL:   Patient will meet greater than or equal to 90% of their needs  MONITOR:   Labs, Weight trends, TF tolerance, Skin, I & O's  REASON FOR ASSESSMENT:   Consult Enteral/tube feeding initiation and management  ASSESSMENT:   86 year old male with PMHx of DM, CKD stage IV, A-fib, HTN, hx CVA, neurogenic bladder admitted with acute CVA with right hemiparesis, dysphagia.   -Patient underwent MBSS yesterday. Recommendation was for patient to remain NPO.   Met with patient at bedside. He was very soft-spoken. He reports he has had a decreased appetite for a while PTA. He was unable to provide any details on intake. Patient is unsure about UBW or weight history. Unsure if current weight in chart is accurate as it is the same exact weight copied forward for the past 4 admissions. RD obtained bed scale weight of 85.1 kg today (187.61 lbs). Unsure when weight loss occurred.  Medications reviewed and include: Novolog 0-15 units TID, Novolog 0-5 units QHS, lisinopril, warfarin, D5 in LR at 75 mL/hr (90 grams dextrose; 306 kcal daily).  Labs reviewed: CBG 142-189. Last CMP was on  4/14.  Enteral Access: NGT   Discussed with RN. Discussed with MD via secure chat. Plan is to meet hydration needs with free water flush and IV fluids will be discontinued.  NUTRITION - FOCUSED PHYSICAL EXAM:    Most Recent Value  Orbital Region  Moderate depletion  Upper Arm Region  Moderate depletion  Thoracic and Lumbar Region  Mild depletion  Buccal Region  Moderate depletion  Temple Region  Moderate depletion  Clavicle Bone Region  Moderate depletion  Clavicle and Acromion Bone Region  Moderate depletion  Scapular Bone Region  Unable to assess  Dorsal Hand  Unable to assess  Patellar Region  Moderate depletion  Anterior Thigh Region  Moderate depletion  Posterior Calf Region  Unable to assess  Edema (RD Assessment)  Moderate  Hair  Reviewed  Eyes  Reviewed  Mouth  Reviewed  Skin  Reviewed  Nails  Unable to assess     Diet Order:   Diet Order            Diet NPO time specified  Diet effective now             EDUCATION NEEDS:   No education needs have been identified at this time  Skin:  Skin Assessment: Skin Integrity Issues:(stage I left buttocks)  Last BM:  03/31/2020 type 7  Height:   Ht Readings from Last 1 Encounters:  04/01/2020 6' (1.829 m)   Weight:   Wt Readings from Last 1 Encounters:  03/31/20 85.1 kg   Ideal Body Weight:  80.9  kg  BMI:  Body mass index is 25.44 kg/m.  Estimated Nutritional Needs:   Kcal:  1900-2100  Protein:  100-115 grams  Fluid:  1.8-2 L/day  Jacklynn Barnacle, MS, RD, LDN Pager number available on Amion

## 2020-03-31 NOTE — Progress Notes (Signed)
SLP Cancellation Note  Patient Details Name: DAVIT VASSAR MRN: [id number] DOB: 05-31-1931   Cancelled treatment:       Reason Eval/Treat Not Completed: Patient at procedure or test/unavailable(pt w/ another discipline).     Orinda Kenner, [place], CCC-SLP Zissel Biederman 03/31/2020, 3:23 PM

## 2020-03-31 NOTE — Progress Notes (Signed)
ANTICOAGULATION CONSULT NOTE - Follow Up Consult  Pharmacy Consult for Warfarin --> enoxaparin Indication: atrial fibrillation and VTE treatment  No Known Allergies  Patient Measurements: Height: 6' (182.9 cm) Weight: 85.1 kg (187 lb 9.8 oz)(bed scale) IBW/kg (Calculated) : 77.6  Vital Signs: Temp: 97.6 F (36.4 C) (04/16 1149) Temp Source: Oral (04/16 1149) BP: 155/66 (04/16 1149) Pulse Rate: 67 (04/16 1149)  Labs: Recent Labs    04/12/2020 0044 03/22/2020 0142 03/30/20 0501 03/31/20 0547  HGB 13.5  --   --   --   HCT 41.2  --   --   --   PLT 221  --   --   --   APTT  --  40*  --   --   LABPROT  --  24.5* 24.4* 28.4*  INR  --  2.2* 2.2* 2.7*  CREATININE 2.24*  --   --   --     Estimated Creatinine Clearance: 26 mL/min (A) (by C-G formula based on SCr of 2.24 mg/dL (H)).  Assessment: Patient is a 86yo male admitted for CVA. Patient has a h/o Factor V Leiden deficiency, PE, afib. Patient was taking Warfarin prior to admission. Pharmacy is consulted to manage Warfarin therapy.  Home Dose: Warfarin 5mg  daily, last dose PTA 4/13 per med rec  Date      INR     Dose 4/14        2.2       Not given (dysphagia, NPO) 4/15        2.2    None given (order expired) 4/16     2.7  Goal of Therapy:  INR 2-3 Monitor platelets by anticoagulation protocol: Yes   Plan:  Apparently we cannot crush warfarin for NG tube administration (not because of the drug properties but because it is a hazardous drug). Discussed with MD - plan to change to full dose enoxaparin when INR <2 INR 2.7 this AM, INR still therapeutic despite two missed doses.   Will follow up INR in AM and start enoxaparin when INR is less than 2.   CBC in AM.   Pt received medications via tube this AM - no changes to current med list.  Pharmacy will continue to follow.   Rayna Sexton, PharmD, BCPS Clinical Pharmacist 03/31/2020 12:50 PM

## 2020-04-01 DIAGNOSIS — Z7901 Long term (current) use of anticoagulants: Secondary | ICD-10-CM | POA: Diagnosis not present

## 2020-04-01 LAB — GLUCOSE, CAPILLARY
Glucose-Capillary: 185 mg/dL — ABNORMAL HIGH (ref 70–99)
Glucose-Capillary: 219 mg/dL — ABNORMAL HIGH (ref 70–99)
Glucose-Capillary: 234 mg/dL — ABNORMAL HIGH (ref 70–99)
Glucose-Capillary: 181 mg/dL — ABNORMAL HIGH (ref 70–99)
Glucose-Capillary: 179 mg/dL — ABNORMAL HIGH (ref 70–99)
Glucose-Capillary: 163 mg/dL — ABNORMAL HIGH (ref 70–99)

## 2020-04-01 LAB — CBC
HCT: 41.4 % (ref 39.0–52.0)
Hemoglobin: 13.8 g/dL (ref 13.0–17.0)
MCH: 32.2 pg (ref 26.0–34.0)
MCHC: 33.3 g/dL (ref 30.0–36.0)
MCV: 96.5 fL (ref 80.0–100.0)
Platelets: 234 10*3/uL (ref 150–400)
RBC: 4.29 MIL/uL (ref 4.22–5.81)
RDW: 13.9 % (ref 11.5–15.5)
WBC: 13.5 10*3/uL — ABNORMAL HIGH (ref 4.0–10.5)
nRBC: 0 % (ref 0.0–0.2)

## 2020-04-01 LAB — BASIC METABOLIC PANEL
Anion gap: 10 (ref 5–15)
BUN: 70 mg/dL — ABNORMAL HIGH (ref 8–23)
CO2: 20 mmol/L — ABNORMAL LOW (ref 22–32)
Calcium: 8.6 mg/dL — ABNORMAL LOW (ref 8.9–10.3)
Chloride: 114 mmol/L — ABNORMAL HIGH (ref 98–111)
Creatinine, Ser: 2.81 mg/dL — ABNORMAL HIGH (ref 0.61–1.24)
GFR calc Af Amer: 23 mL/min — ABNORMAL LOW (ref >60)
GFR calc non Af Amer: 19 mL/min — ABNORMAL LOW (ref >60)
Glucose, Bld: 261 mg/dL — ABNORMAL HIGH (ref 70–99)
Potassium: 4.6 mmol/L (ref 3.5–5.1)
Sodium: 144 mmol/L (ref 135–145)

## 2020-04-01 LAB — MAGNESIUM: Magnesium: 1.7 mg/dL (ref 1.7–2.4)

## 2020-04-01 LAB — PROTIME-INR
INR: 2.8 — ABNORMAL HIGH (ref 0.8–1.2)
Prothrombin Time: 29.7 seconds — ABNORMAL HIGH (ref 11.4–15.2)

## 2020-04-01 LAB — PHOSPHORUS: Phosphorus: 3.6 mg/dL (ref 2.5–4.6)

## 2020-04-01 MED ORDER — INSULIN GLARGINE 100 UNIT/ML ~~LOC~~ SOLN
15.0000 [IU] | Freq: Every day | SUBCUTANEOUS | Status: DC
  Administered 2020-04-01: 10:00:00 15 [IU] via SUBCUTANEOUS
  Filled 2020-04-01 (×2): qty 0.15

## 2020-04-01 NOTE — Progress Notes (Signed)
Patient has pulled out his NG tube. Paged on-call.

## 2020-04-01 NOTE — Progress Notes (Signed)
PT Cancellation Note  Patient Details Name: Richard [name] MRN: [id number] DOB: 1931-06-15   Cancelled Treatment:     PT attempt. Pt was asleep upon arriving but does easily awake. He however was unable to stay awake. Pt was performing exercises in bed but drifts to sleep 3 x during. Not appropriate at this time for PT session. Acute PT will continue to progress as able per POC.    Willette Pa 04/01/2020, 2:54 PM

## 2020-04-01 NOTE — Progress Notes (Signed)
ANTICOAGULATION CONSULT NOTE - Follow Up Consult  Pharmacy Consult for Warfarin --> enoxaparin Indication: atrial fibrillation and VTE treatment  No Known Allergies  Patient Measurements: Height: 6' (182.9 cm) Weight: 85.9 kg (189 lb 6 oz) IBW/kg (Calculated) : 77.6  Vital Signs: Temp: 97.9 F (36.6 C) (04/17 0750) Temp Source: Oral (04/16 2212) BP: 139/76 (04/17 0750) Pulse Rate: 77 (04/17 0750)  Labs: Recent Labs    03/30/20 0501 03/31/20 0547 04/01/20 0413  HGB  --   --  13.8  HCT  --   --  41.4  PLT  --   --  234  LABPROT 24.4* 28.4* 29.7*  INR 2.2* 2.7* 2.8*  CREATININE  --   --  2.81*    Estimated Creatinine Clearance: 20.7 mL/min (A) (by C-G formula based on SCr of 2.81 mg/dL (H)).  Assessment: Patient is a 86yo male admitted for CVA. Patient has a h/o Factor V Leiden deficiency, PE, afib. Patient was taking Warfarin prior to admission. Pharmacy is consulted to manage Warfarin therapy.  Home Dose: Warfarin 5mg  daily, last dose PTA 4/13 per med rec  Date      INR     Dose 4/14        2.2       Not given (dysphagia, NPO) 4/15        2.2    None given (order expired) 4/16     2.7    None Given 4/17    2.8    None given  Goal of Therapy:  INR 2-3 Monitor platelets by anticoagulation protocol: Yes   Plan:  Patient has not received any anticoagulation since admission, however INR continues to increase. Hgb stable. No changes at this time, will continue to hold warfarin/enoxaparin  Recheck INR tomorrow morning.   Plan to start full dose enoxaparin when INR < 2  Pharmacy will continue to follow.   Rexene Edison, PharmD, BCPS Clinical Pharmacist 04/01/2020

## 2020-04-01 NOTE — Progress Notes (Addendum)
Progress Note    Richard [name]  XBM:[id number] DOB: May 29, 1931  DOA: 04/10/2020 PCP: [name], MD      Brief Narrative:    Medical records reviewed and are as summarized below:  Richard [name] is an 86 y.o. male  with medical history significant for paroxysmal A. 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       Assessment/Plan:   Principal Problem:   Acute CVA (cerebrovascular accident) ([place]) Active Problems:   PAF (paroxysmal atrial fibrillation) (HCC)   HTN (hypertension)   CKD (chronic kidney disease) stage 4, GFR 15-29 ml/min (HCC)   Hemiparesis affecting right side as late effect of cerebrovascular accident (CVA) ([place])   Type 2 diabetes mellitus with diabetic peripheral angiopathy without gangrene (HCC)   Neurogenic bladder   Chronic anticoagulation   Pressure injury of buttock, stage 1, left   Malnutrition of moderate degree    Acute CVA (cerebrovascular accident) ([place]) with right hemiparesis -MRI brain showed small acute infarct in the left basal ganglia and corona radiata.  Repeat CT head did not show any evidence of intracranial bleed. 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 -PT and OT recommend discharge to SNF  Dysphagia/moderate protein calorie malnutrition -Patient failed swallow evaluation.  Keep NPO.  NG tube was placed by radiologist.   Continue enteral nutrition.  Follow-up with dietitian.  Monitor electrolytes.   Diarrhea C. difficile toxin negative.  Lomotil as needed for diarrhea.  Right forearm swelling likely from IV infiltration Chest pain, RN, has been notified to remove peripheral IV cannula    PAF (paroxysmal atrial fibrillation) (HCC) --Replace Coumadin with Lovenox when INR is less than 2 (until patient can take Coumadin by mouth).  Continue metoprolol    HTN (hypertension) -Continue lisinopril and metoprolol    CKD (chronic kidney disease) stage 4, GFR 15-29 ml/min (HCC) -Renal function at baseline      Type 2 diabetes mellitus with hyperglycemia -Treat with Lantus and sliding scale insulin coverage    Neurogenic bladder -Can continue intermittent self-catheterization as needed.    Abnormal urinalysis/pyuria He is asymptomatic.  No indication for antibiotics at this time.   Chronic anticoagulation History of PE History of factor V Leiden deficiency  Stage I left buttock decubitus ulcer Continue local wound care   Body mass index is 25.68 kg/m.    Pressure Injury 03/30/20 Buttocks Left Stage 1 -  Intact skin with non-blanchable redness of a localized area usually over a bony prominence. Moisture associated damage (Active)  03/30/20 0036  Location: Buttocks  Location Orientation: Left  Staging: Stage 1 -  Intact skin with non-blanchable redness of a localized area usually over a bony prominence.  Wound Description (Comments): Moisture associated damage  Present on Admission: Yes          Family Communication/Anticipated D/C date and plan/Code Status   DVT prophylaxis: Warfarin Code Status: DNR Family Communication: Plan discussed with  patient Disposition Plan:    Status is: Inpatient  Remains inpatient appropriate because:Unsafe d/c plan   Dispo: The patient is from: Home              Anticipated d/c is to: SNF              Anticipated d/c date is: > 3 days               Patient currently is not medically stable to d/c.            Subjective:   C/o general weakness. He had diarrhea last night but none this morning. No vomiting, abdominal pain, shortness of breath or chest pain   Objective:    Vitals:   03/31/20 2212 04/01/20 0500 04/01/20 0750 04/01/20 1151  BP: (!) 148/89  139/76 116/61  Pulse: 78  77 62  Resp: 16  18 18   Temp: 97.8 F (36.6 C)  97.9 F (36.6 C) 98.9 F (37.2 C)  TempSrc: Oral     SpO2: 97%  100% 100%  Weight:  85.9 kg    Height:       No intake or output data in the 24 hours ending 04/01/20 1521 Filed Weights   03/20/2020 0035 03/31/20 0931 04/01/20 0500  Weight: 99.8 kg 85.1 kg 85.9 kg    Exam:  GEN: NAD SKIN: No rash.  Stage I left buttock decubitus ulcer present on admission EYES: EOMI ENT: MMM CV: RRR PULM: CTA B ABD: soft, ND, NT, +BS CNS: AAO x 3, right facial droop, power in right upper extremity has improved to 4/5 and right lower extremity 3/5. EXT: Right forearm swelling near peripheral IV site     Data Reviewed:   I have personally reviewed following labs and imaging studies:  Labs: Labs show the following:   Basic Metabolic Panel: Recent Labs  Lab 04/01/2020 0044 04/01/20 0413  NA 140 144  K 4.6 4.6  CL 109 114*  CO2 22 20*  GLUCOSE 158* 261*  BUN 50* 70*  CREATININE 2.24* 2.81*  CALCIUM 8.4* 8.6*  MG  --  1.7  PHOS  --  3.6   GFR Estimated Creatinine Clearance: 20.7 mL/min (A) (by C-G formula based on SCr of 2.81 mg/dL (H)). Liver Function Tests: Recent Labs  Lab 04/04/2020 0044  AST 22  ALT 17  ALKPHOS 95  BILITOT 0.6  PROT 7.0  ALBUMIN 2.9*   No results for input(s): LIPASE, AMYLASE in the last 168 hours. No results for input(s): AMMONIA in the last 168 hours. Coagulation profile Recent Labs  Lab 03/20/2020 0142 03/30/20 0501 03/31/20 0547 04/01/20 0413  INR 2.2* 2.2* 2.7* 2.8*    CBC: Recent Labs  Lab 03/25/2020 0044 04/01/20 0413  WBC 14.3* 13.5*    NEUTROABS 9.0*  --   HGB 13.5 13.8  HCT 41.2 41.4  MCV 98.3 96.5  PLT 221 234   Cardiac Enzymes: No results for input(s): CKTOTAL, CKMB, CKMBINDEX, TROPONINI in the last 168 hours. BNP (last 3 results) No results for input(s): PROBNP in the last 8760 hours. CBG: Recent Labs  Lab 03/31/20 2144 04/01/20 0101 04/01/20 0502 04/01/20 0745 04/01/20 1140  GLUCAP 151* 185* 219* 234* 181*   D-Dimer: No results for input(s): DDIMER in the last 72 hours. Hgb A1c: No results for input(s): HGBA1C in the last 72 hours. Lipid Profile: No results for input(s): CHOL, HDL, LDLCALC, TRIG, CHOLHDL, LDLDIRECT in the last 72 hours. Thyroid function studies: No results  for input(s): TSH, T4TOTAL, T3FREE, THYROIDAB in the last 72 hours.  Invalid input(s): FREET3 Anemia work up: No results for input(s): VITAMINB12, FOLATE, FERRITIN, TIBC, IRON, RETICCTPCT in the last 72 hours. Sepsis Labs: Recent Labs  Lab 03/28/2020 0044 04/01/20 0413  WBC 14.3* 13.5*    Microbiology Recent Results (from the past 240 hour(s))  Respiratory Panel by RT PCR (Flu A&B, Covid) - Nasopharyngeal Swab     Status: None   Collection Time: 03/25/2020 12:52 AM   Specimen: Nasopharyngeal Swab  Result Value Ref Range Status   SARS Coronavirus 2 by RT PCR NEGATIVE NEGATIVE Final    Comment: (NOTE) SARS-CoV-2 target nucleic acids are NOT DETECTED. The SARS-CoV-2 RNA is generally detectable in upper respiratoy specimens during the acute phase of infection. The lowest concentration of SARS-CoV-2 viral copies this assay can detect is 131 copies/mL. A negative result does not preclude SARS-Cov-2 infection and should not be used as the sole basis for treatment or other patient management decisions. A negative result may occur with  improper specimen collection/handling, submission of specimen other than nasopharyngeal swab, presence of viral mutation(s) within the areas targeted by this assay, and inadequate number of viral  copies (<131 copies/mL). A negative result must be combined with clinical observations, patient history, and epidemiological information. The expected result is Negative. Fact Sheet for Patients:  PinkCheek.be Fact Sheet for Healthcare Providers:  GravelBags.it This test is not yet ap proved or cleared by the [place] FDA and  has been authorized for detection and/or diagnosis of SARS-CoV-2 by FDA under an Emergency Use Authorization (EUA). This EUA will remain  in effect (meaning this test can be used) for the duration of the COVID-19 declaration under Section 564(b)(1) of the Act, 21 U.S.C. section 360bbb-3(b)(1), unless the authorization is terminated or revoked sooner.    Influenza A by PCR NEGATIVE NEGATIVE Final   Influenza B by PCR NEGATIVE NEGATIVE Final    Comment: (NOTE) The Xpert Xpress SARS-CoV-2/FLU/RSV assay is intended as an aid in  the diagnosis of influenza from Nasopharyngeal swab specimens and  should not be used as a sole basis for treatment. Nasal washings and  aspirates are unacceptable for Xpert Xpress SARS-CoV-2/FLU/RSV  testing. Fact Sheet for Patients: PinkCheek.be Fact Sheet for Healthcare Providers: GravelBags.it This test is not yet approved or cleared by the [place] FDA and  has been authorized for detection and/or diagnosis of SARS-CoV-2 by  FDA under an Emergency Use Authorization (EUA). This EUA will remain  in effect (meaning this test can be used) for the duration of the  Covid-19 declaration under Section 564(b)(1) of the Act, 21  U.S.C. section 360bbb-3(b)(1), unless the authorization is  terminated or revoked. Performed at [facility], [place], [place] [address]   C Difficile Quick Screen w PCR reflex     Status: None   Collection Time: 03/31/20 10:55 AM   Specimen: STOOL  Result Value Ref  Range Status   C Diff antigen NEGATIVE NEGATIVE Final   C Diff toxin NEGATIVE NEGATIVE Final   C Diff interpretation No C. difficile detected.  Final    Comment: Performed at [facility], [address] [place], [place] [address]    Procedures and diagnostic studies:  DG Abd 1 View  Result Date: 03/31/2020 CLINICAL DATA:  Nasogastric tube placement EXAM: ABDOMEN - 1 VIEW COMPARISON:  Earlier today FINDINGS: The enteric tube tip is at the stomach. Visualized bowel gas pattern is normal, with oral  contrast seen in the proximal colon. Reticulation at the lung bases without asymmetric density. IMPRESSION: The enteric tube tip is at the stomach. Electronically Signed   By: Monte Fantasia M.D.   On: 03/31/2020 05:45   DG Abd 1 View  Result Date: 03/31/2020 CLINICAL DATA:  NG tube placement EXAM: ABDOMEN - 1 VIEW COMPARISON:  03/30/2020 FINDINGS: Esophageal tube is folded upon itself over the gastric fundus. Mild residual contrast within the bowel. IMPRESSION: Enteric tube is looped back upon itself over the gastric fundus Electronically Signed   By: Donavan Foil M.D.   On: 03/31/2020 00:38   DG Abd Portable 1V  Result Date: 03/30/2020 CLINICAL DATA:  Nasogastric tube repositioning EXAM: PORTABLE ABDOMEN - 1 VIEW COMPARISON:  None. FINDINGS: Nasogastric tube tip is in the stomach with the side port at the gastroesophageal junction. There is no bowel dilatation or air-fluid level to suggest bowel obstruction. No free air. Lung bases clear. IMPRESSION: Nasogastric tube tip in stomach with side port at gastroesophageal junction. Advise advancing nasogastric tube 4-5 cm. No bowel obstruction or free air. Electronically Signed   By: Lowella Grip III M.D.   On: 03/30/2020 19:08   DG Abd Portable 1V  Result Date: 03/30/2020 CLINICAL DATA:  Nasogastric tube placement EXAM: PORTABLE ABDOMEN - 1 VIEW COMPARISON:  None. FINDINGS: Nasogastric tube tip is at the gastroesophageal junction with  the side port in the distal esophagus. There is contrast in distal small bowel. No bowel dilatation or air-fluid level to suggest bowel obstruction. No free air. Lung bases clear. IMPRESSION: Nasogastric tube tip at gastroesophageal junction with side port above the gastroesophageal junction. Advise advancing nasogastric tube approximately 7-8 cm to ensure that tip and side port are in the stomach. No bowel obstruction or free air evident. These results will be called to the ordering clinician or representative by the Radiologist Assistant, and communication documented in the PACS or [employer]. Electronically Signed   By: Lowella Grip III M.D.   On: 03/30/2020 18:02   DG Naso G Tube Plc W/Fl W/Rad  Result Date: 03/31/2020 CLINICAL DATA:  Evaluate NG tube. EXAM: NASO G TUBE PLACEMENT WITH FL AND WITH RAD CONTRAST:  None. FLUOROSCOPY TIME:  Fluoroscopy Time:  0 minutes 36 second Radiation Exposure Index (if provided by the fluoroscopic device): 0.9 mGy. Number of Acquired Spot Images: 1 COMPARISON:  KUB 03/31/2020. FINDINGS: NG tube tip is in the upper most portion of the stomach. Under fluoroscopic guidance NG tube was advanced. Its tip and side hole are coiled in the stomach. There no complications. IMPRESSION: Successful NG tube repositioning under fluoroscopic guidance. NG tube tip and side hole coiled in stomach. Electronically Signed   By: Marcello Moores  Register   On: 03/31/2020 08:18    Medications:   .  stroke: mapping our early stages of recovery book   Does not apply Once  . aspirin  81 mg Oral Daily  . atorvastatin  40 mg Oral Daily  . cholecalciferol  2,000 Units Oral Daily  . febuxostat  40 mg Oral QODAY  . feeding supplement (PRO-STAT SUGAR FREE 64)  30 mL Per Tube BID  . free water  150 mL Per Tube Q4H  . insulin aspart  0-15 Units Subcutaneous TID WC  . insulin aspart  0-5 Units Subcutaneous QHS  . insulin glargine  15 Units Subcutaneous Daily  . lisinopril  10 mg Oral Daily    . metoprolol tartrate  12.5 mg Oral BID   Continuous  Infusions: . feeding supplement (JEVITY 1.5 CAL/FIBER)       LOS: 2 days   Zakaree Mcclenahan  [facility]     04/01/2020, 3:21 PM            Progress Note    Richard [name]  GEX:[id number] DOB: [birth year]  DOA: 03/25/2020 PCP: [name], MD      Brief Narrative:    Medical records reviewed and are as summarized below:  Richard [name] is an 86 y.o. male  with medical history significant for paroxysmal A. 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       Assessment/Plan:   Principal Problem:   Acute CVA (cerebrovascular accident) ([place]) Active Problems:   PAF (paroxysmal atrial fibrillation) (H[facility])   HTN (hypertension)   CKD (chronic kidney disease) stage 4, GFR 15-29 ml/min (HCC)   Hemiparesis affecting right side as late effect of cerebrovascular accident (CVA) ([place])   Type 2 diabetes mellitus with diabetic peripheral angiopathy without gangrene (HCC)   Neurogenic bladder   Chronic anticoagulation   Pressure injury of buttock, stage 1, left   Malnutrition of moderate degree    Acute CVA (cerebrovascular accident) ([place]) with right hemiparesis -MRI brain showed small acute infarct in the left basal ganglia and corona radiata.  Repeat CT head did not show any evidence of intracranial bleed. 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 -PT and OT recommend discharge  to SNF  Dysphagia/moderate protein calorie malnutrition -Patient failed swallow evaluation.  Keep NPO.  NG tube was placed by radiologist.  He has been started on enteral nutrition.  Follow-up with dietitian.  Monitor electrolytes.  Discontinue IV fluids  Diarrhea C. difficile toxin negative.  Lomotil as needed for diarrhea.  Right forearm swelling from IV infiltration Resolved.    PAF (paroxysmal atrial fibrillation) (HCC) -Continue Coumadin and metoprolol    HTN (hypertension) -Continue lisinopril and metoprolol    CKD (chronic kidney disease) stage 4, GFR 15-29 ml/min (HCC) -Renal function at baseline      Type 2 diabetes mellitus with diabetic peripheral angiopathy without gangrene (HCC) -Sliding scale insulin coverage    Neurogenic bladder -Can continue intermittent self-catheterization as needed.   Gout On Febuxostat   Chronic anticoagulation History of PE History of factor V Leiden deficiency -Pharmacy consult for Coumadin  Stage I left buttock decubitus ulcer Continue local wound care   Body mass index is 25.68 kg/m.    Pressure Injury 03/30/20 Buttocks Left Stage 1 -  Intact skin with non-blanchable redness of a localized area usually over a bony prominence. Moisture associated damage (Active)  03/30/20 0036  Location: Buttocks  Location Orientation: Left  Staging: Stage 1 -  Intact skin with non-blanchable redness of a localized area usually over a bony prominence.  Wound Description (Comments): Moisture associated damage  Present on Admission: Yes          Family Communication/Anticipated D/C date and plan/Code Status   DVT prophylaxis: Warfarin Code Status: DNR Family Communication: Plan discussed with patient  Disposition Plan:    Status is: Inpatient  Remains inpatient appropriate because:Unsafe d/c plan   Dispo: The patient is from: Home              Anticipated d/c is to: SNF              Anticipated d/c date is: > 3 days               Patient currently is not medically stable to d/c.            Subjective:   Patient is unable to provide any history today.  He is sleepy   Objective:    Vitals:   03/31/20 2212 04/01/20 0500 04/01/20 0750 04/01/20 1151  BP: (!) 148/89  139/76 116/61  Pulse: 78  77 62  Resp: 16  18 18   Temp: 97.8 F (36.6 C)  97.9 F (36.6 C) 98.9 F (37.2 C)  TempSrc: Oral     SpO2: 97%  100% 100%  Weight:  85.9 kg    Height:       No intake or output data in the 24 hours ending 04/01/20 1527 Filed Weights   03/20/2020 0035 03/31/20 0931 04/01/20 0500  Weight: 99.8 kg 85.1 kg 85.9 kg    Exam:  GEN: NAD SKIN: No rash.  Stage I left buttock decubitus ulcer present on admission EYES: EOMI ENT: MMM CV: RRR PULM: CTA B ABD: soft, ND, NT, +BS CNS: Drowsy but arousable.  Right facial droop EXT: Right forearm swelling has resolved     Data Reviewed:   I have personally reviewed following labs and imaging studies:  Labs: Labs show the following:   Basic Metabolic Panel: Recent Labs  Lab 04/01/2020 0044 04/01/20 0413  NA 140 144  K 4.6 4.6  CL 109 114*  CO2 22 20*  GLUCOSE 158* 261*  BUN 50* 70*  CREATININE 2.24* 2.81*  CALCIUM 8.4* 8.6*  MG  --  1.7  PHOS  --  3.6   GFR Estimated Creatinine Clearance: 20.7 mL/min (A) (by C-G formula based on SCr of 2.81 mg/dL (H)). Liver Function Tests: Recent Labs  Lab 04/09/2020 0044  AST 22  ALT 17  ALKPHOS 95  BILITOT 0.6  PROT 7.0  ALBUMIN 2.9*   No results for input(s): LIPASE, AMYLASE in the last 168 hours. No results for input(s): AMMONIA in the last 168 hours. Coagulation profile Recent Labs  Lab 03/28/2020 0142 03/30/20 0501 03/31/20 0547 04/01/20 0413  INR 2.2* 2.2* 2.7* 2.8*    CBC: Recent Labs  Lab 04/14/2020 0044 04/01/20 0413  WBC 14.3* 13.5*  NEUTROABS 9.0*  --   HGB 13.5 13.8  HCT 41.2 41.4  MCV 98.3 96.5  PLT 221 234   Cardiac Enzymes: No results for input(s): CKTOTAL, CKMB,  CKMBINDEX, TROPONINI in the last 168 hours. BNP (last 3 results) No results for input(s): PROBNP in the last 8760 hours. CBG: Recent Labs  Lab 03/31/20 2144 04/01/20 0101 04/01/20 0502 04/01/20 0745 04/01/20 1140  GLUCAP 151* 185* 219* 234* 181*   D-Dimer: No results for input(s): DDIMER in the last 72 hours. Hgb A1c: No results for input(s): HGBA1C in the last 72 hours. Lipid Profile: No results for input(s): CHOL, HDL, LDLCALC, TRIG, CHOLHDL, LDLDIRECT in the last 72 hours. Thyroid function studies: No results for input(s): TSH, T4TOTAL, T3FREE, THYROIDAB in the last 72 hours.  Invalid input(s): FREET3 Anemia work up: No results for input(s): VITAMINB12, FOLATE, FERRITIN, TIBC, IRON,  RETICCTPCT in the last 72 hours. Sepsis Labs: Recent Labs  Lab 03/20/2020 0044 04/01/20 0413  WBC 14.3* 13.5*    Microbiology Recent Results (from the past 240 hour(s))  Respiratory Panel by RT PCR (Flu A&B, Covid) - Nasopharyngeal Swab     Status: None   Collection Time: 03/26/2020 12:52 AM   Specimen: Nasopharyngeal Swab  Result Value Ref Range Status   SARS Coronavirus 2 by RT PCR NEGATIVE NEGATIVE Final    Comment: (NOTE) SARS-CoV-2 target nucleic acids are NOT DETECTED. The SARS-CoV-2 RNA is generally detectable in upper respiratoy specimens during the acute phase of infection. The lowest concentration of SARS-CoV-2 viral copies this assay can detect is 131 copies/mL. A negative result does not preclude SARS-Cov-2 infection and should not be used as the sole basis for treatment or other patient management decisions. A negative result may occur with  improper specimen collection/handling, submission of specimen other than nasopharyngeal swab, presence of viral mutation(s) within the areas targeted by this assay, and inadequate number of viral copies (<131 copies/mL). A negative result must be combined with clinical observations, patient history, and epidemiological information.  The expected result is Negative. Fact Sheet for Patients:  PinkCheek.be Fact Sheet for Healthcare Providers:  GravelBags.it This test is not yet ap proved or cleared by the [place] FDA and  has been authorized for detection and/or diagnosis of SARS-CoV-2 by FDA under an Emergency Use Authorization (EUA). This EUA will remain  in effect (meaning this test can be used) for the duration of the COVID-19 declaration under Section 564(b)(1) of the Act, 21 U.S.C. section 360bbb-3(b)(1), unless the authorization is terminated or revoked sooner.    Influenza A by PCR NEGATIVE NEGATIVE Final   Influenza B by PCR NEGATIVE NEGATIVE Final    Comment: (NOTE) The Xpert Xpress SARS-CoV-2/FLU/RSV assay is intended as an aid in  the diagnosis of influenza from Nasopharyngeal swab specimens and  should not be used as a sole basis for treatment. Nasal washings and  aspirates are unacceptable for Xpert Xpress SARS-CoV-2/FLU/RSV  testing. Fact Sheet for Patients: PinkCheek.be Fact Sheet for Healthcare Providers: GravelBags.it This test is not yet approved or cleared by the [place] FDA and  has been authorized for detection and/or diagnosis of SARS-CoV-2 by  FDA under an Emergency Use Authorization (EUA). This EUA will remain  in effect (meaning this test can be used) for the duration of the  Covid-19 declaration under Section 564(b)(1) of the Act, 21  U.S.C. section 360bbb-3(b)(1), unless the authorization is  terminated or revoked. Performed at [facility], [place], [place] [address]   C Difficile Quick Screen w PCR reflex     Status: None   Collection Time: 03/31/20 10:55 AM   Specimen: STOOL  Result Value Ref Range Status   C Diff antigen NEGATIVE NEGATIVE Final   C Diff toxin NEGATIVE NEGATIVE Final   C Diff interpretation No C. difficile  detected.  Final    Comment: Performed at [facility], [address] [place], [place] [address]    Procedures and diagnostic studies:  DG Abd 1 View  Result Date: 03/31/2020 CLINICAL DATA:  Nasogastric tube placement EXAM: ABDOMEN - 1 VIEW COMPARISON:  Earlier today FINDINGS: The enteric tube tip is at the stomach. Visualized bowel gas pattern is normal, with oral contrast seen in the proximal colon. Reticulation at the lung bases without asymmetric density. IMPRESSION: The enteric tube tip is at the stomach. Electronically Signed  By: Monte Fantasia M.D.   On: 03/31/2020 05:45   DG Abd 1 View  Result Date: 03/31/2020 CLINICAL DATA:  NG tube placement EXAM: ABDOMEN - 1 VIEW COMPARISON:  03/30/2020 FINDINGS: Esophageal tube is folded upon itself over the gastric fundus. Mild residual contrast within the bowel. IMPRESSION: Enteric tube is looped back upon itself over the gastric fundus Electronically Signed   By: Donavan Foil M.D.   On: 03/31/2020 00:38   DG Abd Portable 1V  Result Date: 03/30/2020 CLINICAL DATA:  Nasogastric tube repositioning EXAM: PORTABLE ABDOMEN - 1 VIEW COMPARISON:  None. FINDINGS: Nasogastric tube tip is in the stomach with the side port at the gastroesophageal junction. There is no bowel dilatation or air-fluid level to suggest bowel obstruction. No free air. Lung bases clear. IMPRESSION: Nasogastric tube tip in stomach with side port at gastroesophageal junction. Advise advancing nasogastric tube 4-5 cm. No bowel obstruction or free air. Electronically Signed   By: Lowella Grip III M.D.   On: 03/30/2020 19:08   DG Abd Portable 1V  Result Date: 03/30/2020 CLINICAL DATA:  Nasogastric tube placement EXAM: PORTABLE ABDOMEN - 1 VIEW COMPARISON:  None. FINDINGS: Nasogastric tube tip is at the gastroesophageal junction with the side port in the distal esophagus. There is contrast in distal small bowel. No bowel dilatation or air-fluid level to suggest bowel  obstruction. No free air. Lung bases clear. IMPRESSION: Nasogastric tube tip at gastroesophageal junction with side port above the gastroesophageal junction. Advise advancing nasogastric tube approximately 7-8 cm to ensure that tip and side port are in the stomach. No bowel obstruction or free air evident. These results will be called to the ordering clinician or representative by the Radiologist Assistant, and communication documented in the PACS or [employer]. Electronically Signed   By: Lowella Grip III M.D.   On: 03/30/2020 18:02   DG Naso G Tube Plc W/Fl W/Rad  Result Date: 03/31/2020 CLINICAL DATA:  Evaluate NG tube. EXAM: NASO G TUBE PLACEMENT WITH FL AND WITH RAD CONTRAST:  None. FLUOROSCOPY TIME:  Fluoroscopy Time:  0 minutes 36 second Radiation Exposure Index (if provided by the fluoroscopic device): 0.9 mGy. Number of Acquired Spot Images: 1 COMPARISON:  KUB 03/31/2020. FINDINGS: NG tube tip is in the upper most portion of the stomach. Under fluoroscopic guidance NG tube was advanced. Its tip and side hole are coiled in the stomach. There no complications. IMPRESSION: Successful NG tube repositioning under fluoroscopic guidance. NG tube tip and side hole coiled in stomach. Electronically Signed   By: Marcello Moores  Register   On: 03/31/2020 08:18    Medications:   .  stroke: mapping our early stages of recovery book   Does not apply Once  . aspirin  81 mg Oral Daily  . atorvastatin  40 mg Oral Daily  . cholecalciferol  2,000 Units Oral Daily  . febuxostat  40 mg Oral QODAY  . feeding supplement (PRO-STAT SUGAR FREE 64)  30 mL Per Tube BID  . free water  150 mL Per Tube Q4H  . insulin aspart  0-15 Units Subcutaneous TID WC  . insulin aspart  0-5 Units Subcutaneous QHS  . insulin glargine  15 Units Subcutaneous Daily  . lisinopril  10 mg Oral Daily  . metoprolol tartrate  12.5 mg Oral BID   Continuous Infusions: . feeding supplement (JEVITY 1.5 CAL/FIBER)       LOS: 2 days    Devoiry Corriher  [facility]     04/01/2020,  3:27 PM

## 2020-04-01 NOTE — Plan of Care (Signed)

## 2020-04-02 DIAGNOSIS — I480 Paroxysmal atrial fibrillation: Secondary | ICD-10-CM | POA: Diagnosis not present

## 2020-04-02 DIAGNOSIS — Z7901 Long term (current) use of anticoagulants: Secondary | ICD-10-CM | POA: Diagnosis not present

## 2020-04-02 LAB — GLUCOSE, CAPILLARY
Glucose-Capillary: 106 mg/dL — ABNORMAL HIGH (ref 70–99)
Glucose-Capillary: 110 mg/dL — ABNORMAL HIGH (ref 70–99)
Glucose-Capillary: 127 mg/dL — ABNORMAL HIGH (ref 70–99)
Glucose-Capillary: 138 mg/dL — ABNORMAL HIGH (ref 70–99)
Glucose-Capillary: 141 mg/dL — ABNORMAL HIGH (ref 70–99)
Glucose-Capillary: 169 mg/dL — ABNORMAL HIGH (ref 70–99)
Glucose-Capillary: 182 mg/dL — ABNORMAL HIGH (ref 70–99)
Glucose-Capillary: 193 mg/dL — ABNORMAL HIGH (ref 70–99)

## 2020-04-02 LAB — BASIC METABOLIC PANEL
Anion gap: 9 (ref 5–15)
BUN: 89 mg/dL — ABNORMAL HIGH (ref 8–23)
CO2: 19 mmol/L — ABNORMAL LOW (ref 22–32)
Calcium: 8.6 mg/dL — ABNORMAL LOW (ref 8.9–10.3)
Chloride: 117 mmol/L — ABNORMAL HIGH (ref 98–111)
Creatinine, Ser: 3.1 mg/dL — ABNORMAL HIGH (ref 0.61–1.24)
GFR calc Af Amer: 20 mL/min — ABNORMAL LOW (ref >60)
GFR calc non Af Amer: 17 mL/min — ABNORMAL LOW (ref >60)
Glucose, Bld: 220 mg/dL — ABNORMAL HIGH (ref 70–99)
Potassium: 4.8 mmol/L (ref 3.5–5.1)
Sodium: 145 mmol/L (ref 135–145)

## 2020-04-02 LAB — HEPARIN LEVEL (UNFRACTIONATED)
Heparin Unfractionated: <0.1 IU/mL — ABNORMAL LOW (ref 0.30–0.70)
Heparin Unfractionated: 0.44 IU/mL (ref 0.30–0.70)
Heparin Unfractionated: 0.25 IU/mL — ABNORMAL LOW (ref 0.30–0.70)

## 2020-04-02 LAB — PROTIME-INR
INR: 1.9 — ABNORMAL HIGH (ref 0.8–1.2)
Prothrombin Time: 22 seconds — ABNORMAL HIGH (ref 11.4–15.2)

## 2020-04-02 MED ORDER — LACTATED RINGERS IV SOLN: INTRAVENOUS | Status: DC

## 2020-04-02 MED ORDER — DEXTROSE-NACL 5-0.45 % IV SOLN: INTRAVENOUS | Status: DC

## 2020-04-02 MED ORDER — HEPARIN (PORCINE) 25000 UT/250ML-% IV SOLN
1700.0000 [IU]/h | INTRAVENOUS | Status: DC
  Administered 2020-04-02: 13:00:00 1100 [IU]/h via INTRAVENOUS
  Administered 2020-04-03: 23:00:00 1700 [IU]/h via INTRAVENOUS
  Administered 2020-04-03: 06:00:00 1300 [IU]/h via INTRAVENOUS
  Administered 2020-04-04 – 2020-04-05 (×2): 1700 [IU]/h via INTRAVENOUS
  Filled 2020-04-02 (×5): qty 250

## 2020-04-02 MED ORDER — INSULIN ASPART 100 UNIT/ML ~~LOC~~ SOLN
0.0000 [IU] | SUBCUTANEOUS | Status: DC
  Administered 2020-04-03: 2 [IU] via SUBCUTANEOUS
  Administered 2020-04-03: 01:00:00 1 [IU] via SUBCUTANEOUS
  Administered 2020-04-03: 2 [IU] via SUBCUTANEOUS
  Administered 2020-04-03 (×2): 1 [IU] via SUBCUTANEOUS
  Administered 2020-04-03 – 2020-04-04 (×3): 2 [IU] via SUBCUTANEOUS
  Administered 2020-04-04 (×2): 3 [IU] via SUBCUTANEOUS
  Administered 2020-04-04: 17:00:00 1 [IU] via SUBCUTANEOUS
  Administered 2020-04-05 (×2): 2 [IU] via SUBCUTANEOUS
  Administered 2020-04-05: 01:00:00 3 [IU] via SUBCUTANEOUS
  Filled 2020-04-02 (×14): qty 1

## 2020-04-02 MED ORDER — HEPARIN BOLUS VIA INFUSION
4000.0000 [IU] | Freq: Once | INTRAVENOUS | Status: AC
  Administered 2020-04-02: 13:00:00 4000 [IU] via INTRAVENOUS
  Filled 2020-04-02: qty 4000

## 2020-04-02 NOTE — Progress Notes (Addendum)
Notified by patient's RN that patient pulled his NG tube out again despite having mitts on.  Patient with intermittent confusion however he is pleasant and easily redirected.  I attempted to reinsert NG tube without success patient was very cooperative with the procedure.  Given the multiple attempts will again attempt NG tube placement through radiology guidance.  He is currently n.p.o. will obtain IV access and start on D5 half-normal saline for maintenance.   Rufina Falco, DNP, CCRN, FNP-C [facility] Nurse Practitioner

## 2020-04-02 NOTE — Progress Notes (Signed)
This Probation officer checked with Dr Mal Misty if needs to continue NIH and modified NIH stroke assessment and vitals qx4hrs.  Per MD to d/c NIH an modified NIH stroke assessments and to order routine VS.

## 2020-04-02 NOTE — Progress Notes (Signed)
ANTICOAGULATION CONSULT NOTE - Follow Up Consult  Pharmacy Consult for Warfarin --> enoxaparin Indication: atrial fibrillation and VTE treatment  No Known Allergies  Patient Measurements: Height: 6' (182.9 cm) Weight: 84.3 kg (185 lb 13.6 oz) IBW/kg (Calculated) : 77.6  Vital Signs: Temp: 98.4 F (36.9 C) (04/18 1622) Temp Source: Oral (04/18 1622) BP: 147/88 (04/18 1622) Pulse Rate: 75 (04/18 1622)  Labs: Recent Labs    03/31/20 0547 04/01/20 0413 04/02/20 0457 04/02/20 0917 04/02/20 1457  HGB  --  13.8  --   --   --   HCT  --  41.4  --   --   --   PLT  --  234  --   --   --   LABPROT 28.4* 29.7* 22.0*  --   --   INR 2.7* 2.8* 1.9*  --   --   HEPARINUNFRC  --   --   --  <0.10* 0.44  CREATININE  --  2.81* 3.10*  --   --     Estimated Creatinine Clearance: 18.8 mL/min (A) (by C-G formula based on SCr of 3.1 mg/dL (H)).  Assessment: Patient is a 86yo male admitted for CVA. Patient has a h/o Factor V Leiden deficiency, PE, afib. Patient was taking Warfarin prior to admission. Pharmacy is consulted to manage Warfarin therapy.  Patient unable to swallow, cannot crush warfarin. Will transition to IV anticoagulation when INR < 2.0  Home Dose: Warfarin 5mg  daily, last dose PTA 4/13 per med rec  Date      INR     Dose 4/14        2.2       Not given (dysphagia, NPO) 4/15        2.2    None given (order expired) 4/16     2.7    None Given 4/17    2.8    None given 4/18    1.9  Goal of Therapy:  Heparin level: 0.3-0.7   Plan:  INR < 2 Discussed with hospitalitist, will start on heparin at this time due to worsening renal function.  Will start heparin at 1,100 units per hour with a 4,000 unit bolus. Will check anti-Xa in 8 hours and CBC with AM labs.   4/18:  HL @ 1457 = 0.44 Will continue pt on current rate and draw confirmation level on 4/18 @ 2300.   Pharmacy will continue to follow.   [place] D Clinical Pharmacist 04/02/2020

## 2020-04-02 NOTE — Progress Notes (Addendum)
Progress Note    Richard [name]  SEG:[id number] DOB: [birth year]  DOA: 03/28/2020 PCP: [name], MD      Brief Narrative:    Medical records reviewed and are as summarized below:  Richard [name] is an 86 y.o. male  with medical history significant for paroxysmal A. 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       Assessment/Plan:   Principal Problem:   Acute CVA (cerebrovascular accident) ([place]) Active Problems:   PAF (paroxysmal atrial fibrillation) (HCC)   HTN (hypertension)   CKD (chronic kidney disease) stage 4, GFR 15-29 ml/min (HCC)   Hemiparesis affecting right side as late effect of cerebrovascular accident (CVA) ([place])   Type 2 diabetes mellitus with diabetic peripheral angiopathy without gangrene (HCC)   Neurogenic bladder   Chronic anticoagulation   Pressure injury of buttock, stage 1, left   Malnutrition of moderate degree    Acute CVA (cerebrovascular accident) ([place]) with right hemiparesis -MRI brain showed small acute infarct in the left basal ganglia and corona radiata.  Repeat CT head did not show any evidence of intracranial bleed. 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 -PT and OT recommend discharge to SNF  Dysphagia/moderate protein calorie malnutrition -Patient failed swallow evaluation.  Keep NPO.  NG tube was placed by radiologist.   Continue enteral nutrition.  Follow-up with dietitian.  Monitor electrolytes.   Diarrhea C. difficile toxin negative.  Lomotil as needed for diarrhea.  Right forearm swelling likely from IV infiltration Jasmine, RN, has been notified to remove peripheral IV cannula  PAF (paroxysmal atrial fibrillation/ Chronic anticoagulation/ history of PE history of factor V Leiden deficiency Lovenox will be replaced with IV heparin infusion because of worsening kidney function.     HTN (hypertension) Discontinue lisinopril because of AKI    AKI on CKD (chronic kidney disease) stage 4, GFR 15-29 ml/min (HCC) -IV fluids has been started for hydration.  Monitor BMP.      Type 2 diabetes mellitus with hyperglycemia -Treat with Lantus and sliding scale insulin coverage    Neurogenic bladder -Can continue intermittent self-catheterization as needed.    Abnormal urinalysis/pyuria He is asymptomatic.  No indication for antibiotics at this time.  Stage I left buttock decubitus ulcer Continue local wound care   Body mass index is 25.21 kg/m.    Pressure Injury 03/30/20 Buttocks Left Stage 1 -  Intact skin with non-blanchable redness of a localized area usually over a bony prominence. Moisture associated damage (Active)  03/30/20 0036  Location: Buttocks  Location Orientation: Left  Staging: Stage 1 -  Intact skin with non-blanchable redness of a localized area usually over a bony prominence.  Wound Description (Comments): Moisture associated damage  Present on Admission: Yes          Family Communication/Anticipated D/C date and plan/Code Status   DVT prophylaxis: Warfarin Code Status: DNR Family Communication: Plan discussed with patient Disposition Plan:  Status is: Inpatient  Remains inpatient appropriate because:Unsafe d/c plan   Dispo: The patient is from: Home              Anticipated d/c is to: SNF              Anticipated d/c date is: > 3 days               Patient currently is not medically stable to d/c.            Subjective:   C/o general weakness.    Objective:    Vitals:   04/02/20 0434 04/02/20 0500 04/02/20 0744 04/02/20 1148  BP: 118/67  (!) 155/85 (!) 148/72  Pulse: 78  95 88  Resp: 16  (!) 22 20  Temp: 97.6 F (36.4 C)  98.4 F (36.9 C) (!) 97.5 F (36.4 C)  TempSrc: Oral  Oral Oral  SpO2: 94%  92% 90%  Weight:  84.3 kg    Height:        Intake/Output Summary (Last 24 hours) at 04/02/2020 1443 Last data filed at 04/02/2020 0913 Gross per 24 hour  Intake 1248.47 ml  Output 550 ml  Net 698.47 ml   Filed Weights   03/31/20 0931 04/01/20 0500 04/02/20 0500  Weight: 85.1 kg 85.9 kg 84.3 kg    Exam:  GEN: NAD SKIN: No rash.  Stage I left buttock decubitus ulcer present on admission EYES: EOMI ENT: MMM CV: RRR PULM: CTA B ABD: soft, ND, NT, +BS CNS: Drowsy but arousable, right facial droop, he is too weak to follow commands  EXT: No edema or tenderness.     Data Reviewed:   I have personally reviewed following labs and imaging studies:  Labs: Labs show the following:   Basic Metabolic Panel: Recent Labs  Lab 03/23/2020 0044 04/09/2020 0044 04/01/20 0413 04/02/20 0457  NA 140  --  144 145  K 4.6   < > 4.6 4.8  CL 109  --  114* 117*  CO2 22  --  20* 19*  GLUCOSE 158*  --  261* 220*  BUN 50*  --  70* 89*  CREATININE 2.24*  --  2.81* 3.10*  CALCIUM 8.4*  --  8.6* 8.6*  MG  --   --  1.7  --   PHOS  --   --  3.6  --    < > = values in this interval not displayed.   GFR Estimated Creatinine Clearance: 18.8 mL/min (A) (by C-G formula based on SCr of 3.1 mg/dL (H)). Liver Function Tests: Recent Labs  Lab 04/09/2020 0044  AST 22  ALT 17  ALKPHOS 95  BILITOT 0.6  PROT 7.0  ALBUMIN 2.9*   No results for input(s): LIPASE, AMYLASE in the last 168 hours. No results for input(s): AMMONIA in the last 168 hours. Coagulation profile Recent Labs  Lab 03/25/2020 0142 03/30/20 0501  03/31/20 0547 04/01/20 0413 04/02/20 0457  INR 2.2* 2.2* 2.7* 2.8* 1.9*    CBC: Recent Labs  Lab 03/19/2020 0044 04/01/20 0413  WBC 14.3* 13.5*  NEUTROABS 9.0*  --   HGB 13.5 13.8  HCT 41.2 41.4  MCV 98.3 96.5  PLT 221 234   Cardiac Enzymes: No results for input(s): CKTOTAL, CKMB, CKMBINDEX, TROPONINI in the last 168 hours. BNP (last 3 results) No results for input(s): PROBNP in the last 8760 hours. CBG: Recent Labs  Lab 04/01/20 2040 04/02/20 0038 04/02/20 0436 04/02/20 0743 04/02/20 1150  GLUCAP 163* 169* 182* 193* 138*   D-Dimer: No results for input(s): DDIMER in the last 72 hours. Hgb A1c: No results for input(s): HGBA1C in the last 72 hours. Lipid Profile: No results for input(s): CHOL, HDL, LDLCALC, TRIG, CHOLHDL, LDLDIRECT in the last 72 hours. Thyroid function studies: No results for input(s): TSH, T4TOTAL, T3FREE, THYROIDAB in the last 72 hours.  Invalid input(s): FREET3 Anemia work up: No results for input(s): VITAMINB12, FOLATE, FERRITIN, TIBC, IRON, RETICCTPCT in the last 72 hours. Sepsis Labs: Recent Labs  Lab 04/10/2020 0044 04/01/20 0413  WBC 14.3* 13.5*    Microbiology Recent Results (from the past 240 hour(s))  Respiratory Panel by RT PCR (Flu A&B, Covid) - Nasopharyngeal Swab     Status: None   Collection Time: 03/17/2020 12:52 AM   Specimen: Nasopharyngeal Swab  Result Value Ref Range Status   SARS Coronavirus 2 by RT PCR NEGATIVE NEGATIVE Final    Comment: (NOTE) SARS-CoV-2 target nucleic acids are NOT DETECTED. The SARS-CoV-2 RNA is generally detectable in upper respiratoy specimens during the acute phase of infection. The lowest concentration of SARS-CoV-2 viral copies this assay can detect is 131 copies/mL. A negative result does not preclude SARS-Cov-2 infection and should not be used as the sole basis for treatment or other patient management decisions. A negative result may occur with  improper specimen collection/handling,  submission of specimen other than nasopharyngeal swab, presence of viral mutation(s) within the areas targeted by this assay, and inadequate number of viral copies (<131 copies/mL). A negative result must be combined with clinical observations, patient history, and epidemiological information. The expected result is Negative. Fact Sheet for Patients:  PinkCheek.be Fact Sheet for Healthcare Providers:  GravelBags.it This test is not yet ap proved or cleared by the [place] FDA and  has been authorized for detection and/or diagnosis of SARS-CoV-2 by FDA under an Emergency Use Authorization (EUA). This EUA will remain  in effect (meaning this test can be used) for the duration of the COVID-19 declaration under Section 564(b)(1) of the Act, 21 U.S.C. section 360bbb-3(b)(1), unless the authorization is terminated or revoked sooner.    Influenza A by PCR NEGATIVE NEGATIVE Final   Influenza B by PCR NEGATIVE NEGATIVE Final    Comment: (NOTE) The Xpert Xpress SARS-CoV-2/FLU/RSV assay is intended as an aid in  the diagnosis of influenza from Nasopharyngeal swab specimens and  should not be used as a sole basis for treatment. Nasal washings and  aspirates are unacceptable for Xpert Xpress SARS-CoV-2/FLU/RSV  testing. Fact Sheet for Patients: PinkCheek.be Fact Sheet for Healthcare Providers: GravelBags.it This test is not yet approved or cleared by the [place] FDA and  has been authorized for detection and/or diagnosis of SARS-CoV-2 by  FDA under an Emergency Use Authorization (EUA). This EUA will remain  in effect (meaning this test can be used) for the duration of the  Covid-19 declaration under Section 564(b)(1) of the Act, 21  U.S.C. section 360bbb-3(b)(1), unless the authorization is  terminated or revoked. Performed at [facility], [place],  [address]   C Difficile Quick Screen w PCR reflex     Status: None   Collection Time: 03/31/20 10:55 AM   Specimen: STOOL  Result Value Ref Range Status   C Diff antigen NEGATIVE NEGATIVE Final   C Diff toxin NEGATIVE NEGATIVE Final   C Diff interpretation No C. difficile detected.  Final    Comment: Performed at [facility], [address] [place], [place] [address]    Procedures and diagnostic studies:  No results found.  Medications:   .  stroke: mapping our early stages of recovery book   Does not apply Once  . aspirin  81 mg Oral Daily  . atorvastatin  40 mg Oral Daily  . cholecalciferol  2,000 Units Oral Daily  . febuxostat  40 mg Oral QODAY  . feeding supplement (PRO-STAT SUGAR FREE 64)  30 mL Per Tube BID  . free water  150 mL Per Tube Q4H  . insulin aspart  0-15 Units Subcutaneous TID WC  . insulin aspart  0-5 Units Subcutaneous QHS  . metoprolol tartrate  12.5 mg Oral BID   Continuous Infusions: . feeding supplement (JEVITY 1.5 CAL/FIBER) Stopped (04/01/20 2030)  . heparin 1,100 Units/hr (04/02/20 1233)  . lactated ringers 100 mL/hr at 04/02/20 0822     LOS: 3 days   Syrah Daughtrey  [facility]     04/02/2020, 2:43 PM            Progress Note    Richard [name]  ERD:[id number] DOB: 09-14-1931  DOA: 03/18/2020 PCP: [name], MD      Brief Narrative:    Medical records reviewed and are as summarized below:  Richard [name] is an 86 y.o. male  with medical history significant for paroxysmal A. 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       Assessment/Plan:    Principal Problem:   Acute CVA (cerebrovascular accident) ([place]) Active Problems:   PAF (paroxysmal atrial fibrillation) (HCC)   HTN (hypertension)   CKD (chronic kidney disease) stage 4, GFR 15-29 ml/min (HCC)   Hemiparesis affecting right side as late effect of cerebrovascular accident (CVA) ([place])   Type 2 diabetes mellitus with diabetic peripheral angiopathy without gangrene (HCC)   Neurogenic bladder   Chronic anticoagulation   Pressure injury of buttock, stage 1, left   Malnutrition of moderate degree    Acute CVA (cerebrovascular accident) ([place]) with right hemiparesis -MRI brain showed small acute infarct in the left basal ganglia and corona radiata.  Repeat CT head did not show any evidence of intracranial bleed. 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 -PT and OT recommend discharge to SNF  Dysphagia/moderate protein calorie malnutrition -Patient failed swallow evaluation.  Keep NPO.  Patient removed his NG tube yesterday.  Plan to have NG tube replaced by radiologist tomorrow.     AKI on CKD stage 4 Creatinine is worsening.  Restart IV fluids for hydration.  Lisinopril has been held.  Diarrhea C. difficile toxin negative.  Lomotil as needed for diarrhea.    Paroxysmal atrial fibrillation/ chronic anticoagulation/ history of PE/ history of factor V Leiden deficiency -IV heparin infusion has been substituted for Lovenox because of worsening kidney function.    HTN (hypertension) -Continue metoprolol if able      Type 2 diabetes mellitus with diabetic peripheral angiopathy without gangrene (HCC) -Sliding scale insulin coverage    Neurogenic bladder -Can continue  intermittent self-catheterization as needed.   Gout On Febuxostat  Stage I left buttock decubitus  ulcer Continue local wound care  Right forearm swelling from IV infiltration Resolved.   Body mass index is 25.21 kg/m.    Pressure Injury 03/30/20 Buttocks Left Stage 1 -  Intact skin with non-blanchable redness of a localized area usually over a bony prominence. Moisture associated damage (Active)  03/30/20 0036  Location: Buttocks  Location Orientation: Left  Staging: Stage 1 -  Intact skin with non-blanchable redness of a localized area usually over a bony prominence.  Wound Description (Comments): Moisture associated damage  Present on Admission: Yes          Family Communication/Anticipated D/C date and plan/Code Status   DVT prophylaxis: Warfarin Code Status: DNR Family Communication: Plan discussed with patient Disposition Plan:    Status is: Inpatient  Remains inpatient appropriate because:Unsafe d/c plan   Dispo: The patient is from: Home              Anticipated d/c is to: SNF              Anticipated d/c date is: > 3 days              Patient currently is not medically stable to d/c.            Subjective:   Overnight events noted.  Patient pulled out his NG tube yesterday.  Attempt to insert NG tube at the bedside was unsuccessful overnight.  Patient is unable to provide any history.  He is sleepy   Objective:    Vitals:   04/02/20 0434 04/02/20 0500 04/02/20 0744 04/02/20 1148  BP: 118/67  (!) 155/85 (!) 148/72  Pulse: 78  95 88  Resp: 16  (!) 22 20  Temp: 97.6 F (36.4 C)  98.4 F (36.9 C) (!) 97.5 F (36.4 C)  TempSrc: Oral  Oral Oral  SpO2: 94%  92% 90%  Weight:  84.3 kg    Height:        Intake/Output Summary (Last 24 hours) at 04/02/2020 1443 Last data filed at 04/02/2020 0913 Gross per 24 hour  Intake 1248.47 ml  Output 550 ml  Net 698.47 ml   Filed Weights   03/31/20 0931 04/01/20 0500 04/02/20 0500  Weight: 85.1 kg 85.9 kg 84.3 kg    Exam:  GEN: No acute distress  SKIN: No rash.  Stage I left buttock  decubitus ulcer present on admission EYES: EOMI ENT: MMM CV: RRR PULM: No wheezing or rales heard ABD: soft, ND, NT, +BS CNS: Drowsy but arousable.  He talks in a whispered voice and speech is barely audible.  Right facial droop EXT: Right forearm swelling has resolved     Data Reviewed:   I have personally reviewed following labs and imaging studies:  Labs: Labs show the following:   Basic Metabolic Panel: Recent Labs  Lab 03/17/2020 0044 04/05/2020 0044 04/01/20 0413 04/02/20 0457  NA 140  --  144 145  K 4.6   < > 4.6 4.8  CL 109  --  114* 117*  CO2 22  --  20* 19*  GLUCOSE 158*  --  261* 220*  BUN 50*  --  70* 89*  CREATININE 2.24*  --  2.81* 3.10*  CALCIUM 8.4*  --  8.6* 8.6*  MG  --   --  1.7  --   PHOS  --   --  3.6  --    < > =  values in this interval not displayed.   GFR Estimated Creatinine Clearance: 18.8 mL/min (A) (by C-G formula based on SCr of 3.1 mg/dL (H)). Liver Function Tests: Recent Labs  Lab 03/20/2020 0044  AST 22  ALT 17  ALKPHOS 95  BILITOT 0.6  PROT 7.0  ALBUMIN 2.9*   No results for input(s): LIPASE, AMYLASE in the last 168 hours. No results for input(s): AMMONIA in the last 168 hours. Coagulation profile Recent Labs  Lab 03/17/2020 0142 03/30/20 0501 03/31/20 0547 04/01/20 0413 04/02/20 0457  INR 2.2* 2.2* 2.7* 2.8* 1.9*    CBC: Recent Labs  Lab 04/08/2020 0044 04/01/20 0413  WBC 14.3* 13.5*  NEUTROABS 9.0*  --   HGB 13.5 13.8  HCT 41.2 41.4  MCV 98.3 96.5  PLT 221 234   Cardiac Enzymes: No results for input(s): CKTOTAL, CKMB, CKMBINDEX, TROPONINI in the last 168 hours. BNP (last 3 results) No results for input(s): PROBNP in the last 8760 hours. CBG: Recent Labs  Lab 04/01/20 2040 04/02/20 0038 04/02/20 0436 04/02/20 0743 04/02/20 1150  GLUCAP 163* 169* 182* 193* 138*   D-Dimer: No results for input(s): DDIMER in the last 72 hours. Hgb A1c: No results for input(s): HGBA1C in the last 72 hours. Lipid  Profile: No results for input(s): CHOL, HDL, LDLCALC, TRIG, CHOLHDL, LDLDIRECT in the last 72 hours. Thyroid function studies: No results for input(s): TSH, T4TOTAL, T3FREE, THYROIDAB in the last 72 hours.  Invalid input(s): FREET3 Anemia work up: No results for input(s): VITAMINB12, FOLATE, FERRITIN, TIBC, IRON, RETICCTPCT in the last 72 hours. Sepsis Labs: Recent Labs  Lab 04/04/2020 0044 04/01/20 0413  WBC 14.3* 13.5*    Microbiology Recent Results (from the past 240 hour(s))  Respiratory Panel by RT PCR (Flu A&B, Covid) - Nasopharyngeal Swab     Status: None   Collection Time: 03/30/2020 12:52 AM   Specimen: Nasopharyngeal Swab  Result Value Ref Range Status   SARS Coronavirus 2 by RT PCR NEGATIVE NEGATIVE Final    Comment: (NOTE) SARS-CoV-2 target nucleic acids are NOT DETECTED. The SARS-CoV-2 RNA is generally detectable in upper respiratoy specimens during the acute phase of infection. The lowest concentration of SARS-CoV-2 viral copies this assay can detect is 131 copies/mL. A negative result does not preclude SARS-Cov-2 infection and should not be used as the sole basis for treatment or other patient management decisions. A negative result may occur with  improper specimen collection/handling, submission of specimen other than nasopharyngeal swab, presence of viral mutation(s) within the areas targeted by this assay, and inadequate number of viral copies (<131 copies/mL). A negative result must be combined with clinical observations, patient history, and epidemiological information. The expected result is Negative. Fact Sheet for Patients:  PinkCheek.be Fact Sheet for Healthcare Providers:  GravelBags.it This test is not yet ap proved or cleared by the [place] FDA and  has been authorized for detection and/or diagnosis of SARS-CoV-2 by FDA under an Emergency Use Authorization (EUA). This EUA will remain  in  effect (meaning this test can be used) for the duration of the COVID-19 declaration under Section 564(b)(1) of the Act, 21 U.S.C. section 360bbb-3(b)(1), unless the authorization is terminated or revoked sooner.    Influenza A by PCR NEGATIVE NEGATIVE Final   Influenza B by PCR NEGATIVE NEGATIVE Final    Comment: (NOTE) The Xpert Xpress SARS-CoV-2/FLU/RSV assay is intended as an aid in  the diagnosis of influenza from Nasopharyngeal swab specimens and  should not be used as a  sole basis for treatment. Nasal washings and  aspirates are unacceptable for Xpert Xpress SARS-CoV-2/FLU/RSV  testing. Fact Sheet for Patients: PinkCheek.be Fact Sheet for Healthcare Providers: GravelBags.it This test is not yet approved or cleared by the [place] FDA and  has been authorized for detection and/or diagnosis of SARS-CoV-2 by  FDA under an Emergency Use Authorization (EUA). This EUA will remain  in effect (meaning this test can be used) for the duration of the  Covid-19 declaration under Section 564(b)(1) of the Act, 21  U.S.C. section 360bbb-3(b)(1), unless the authorization is  terminated or revoked. Performed at [facility], [place], [place] [address]   C Difficile Quick Screen w PCR reflex     Status: None   Collection Time: 03/31/20 10:55 AM   Specimen: STOOL  Result Value Ref Range Status   C Diff antigen NEGATIVE NEGATIVE Final   C Diff toxin NEGATIVE NEGATIVE Final   C Diff interpretation No C. difficile detected.  Final    Comment: Performed at [facility], [address] [place], [place] [address]    Procedures and diagnostic studies:  No results found.  Medications:   .  stroke: mapping our early stages of recovery book   Does not apply Once  . aspirin  81 mg Oral Daily  . atorvastatin  40 mg Oral Daily  . cholecalciferol  2,000 Units Oral Daily  . febuxostat  40 mg Oral  QODAY  . feeding supplement (PRO-STAT SUGAR FREE 64)  30 mL Per Tube BID  . free water  150 mL Per Tube Q4H  . insulin aspart  0-15 Units Subcutaneous TID WC  . insulin aspart  0-5 Units Subcutaneous QHS  . metoprolol tartrate  12.5 mg Oral BID   Continuous Infusions: . feeding supplement (JEVITY 1.5 CAL/FIBER) Stopped (04/01/20 2030)  . heparin 1,100 Units/hr (04/02/20 1233)  . lactated ringers 100 mL/hr at 04/02/20 0822     LOS: 3 days   Richard [name]  [facility]     04/02/2020, 2:43 PM

## 2020-04-02 NOTE — Progress Notes (Signed)
On call NP attempted to reinsert NG tube.  Unable to insert.

## 2020-04-02 NOTE — Progress Notes (Signed)
PT Cancellation Note  Patient Details Name: Richard [name] MRN: [id number] DOB: 25-Nov-1931   Cancelled Treatment:    Reason Eval/Treat Not Completed: Other (comment)   IV team in with pt on attempt.  Will continue as appropriate.   Chesley Noon 04/02/2020, 11:57 AM

## 2020-04-02 NOTE — Progress Notes (Signed)
Telephone callback message from unit secretary to call Ronne Binning [phone] for pt update; attempted to call number and went straight to voicemail message; no message left; pt does not have a password established and the emergency contact information listed is not the same as the name listed above

## 2020-04-02 NOTE — Progress Notes (Signed)
ANTICOAGULATION CONSULT NOTE - Follow Up Consult  Pharmacy Consult for Warfarin --> enoxaparin Indication: atrial fibrillation and VTE treatment  No Known Allergies  Patient Measurements: Height: 6' (182.9 cm) Weight: 84.3 kg (185 lb 13.6 oz) IBW/kg (Calculated) : 77.6  Vital Signs: Temp: 98.4 F (36.9 C) (04/18 0744) Temp Source: Oral (04/18 0744) BP: 155/85 (04/18 0744) Pulse Rate: 95 (04/18 0744)  Labs: Recent Labs    03/31/20 0547 04/01/20 0413 04/02/20 0457  HGB  --  13.8  --   HCT  --  41.4  --   PLT  --  234  --   LABPROT 28.4* 29.7* 22.0*  INR 2.7* 2.8* 1.9*  CREATININE  --  2.81* 3.10*    Estimated Creatinine Clearance: 18.8 mL/min (A) (by C-G formula based on SCr of 3.1 mg/dL (H)).  Assessment: Patient is a 86yo male admitted for CVA. Patient has a h/o Factor V Leiden deficiency, PE, afib. Patient was taking Warfarin prior to admission. Pharmacy is consulted to manage Warfarin therapy.  Patient unable to swallow, cannot crush warfarin. Will transition to IV anticoagulation when INR < 2.0  Home Dose: Warfarin 5mg  daily, last dose PTA 4/13 per med rec  Date      INR     Dose 4/14        2.2       Not given (dysphagia, NPO) 4/15        2.2    None given (order expired) 4/16     2.7    None Given 4/17    2.8    None given 4/18    1.9  Goal of Therapy:  Heparin level: 0.3-0.7   Plan:  INR < 2 Discussed with hospitalitist, will start on heparin at this time due to worsening renal function.  Will start heparin at 1,100 units per hour with a 4,000 unit bolus. Will check anti-Xa in 8 hours and CBC with AM labs.   Pharmacy will continue to follow.   Rexene Edison, PharmD, BCPS Clinical Pharmacist 04/02/2020

## 2020-04-03 ENCOUNTER — Inpatient Hospital Stay: Payer: Medicare Other

## 2020-04-03 DIAGNOSIS — Z7901 Long term (current) use of anticoagulants: Secondary | ICD-10-CM | POA: Diagnosis not present

## 2020-04-03 LAB — CBC WITH DIFFERENTIAL/PLATELET
Abs Immature Granulocytes: 0.2 10*3/uL — ABNORMAL HIGH (ref 0.00–0.07)
Basophils Absolute: 0.1 10*3/uL (ref 0.0–0.1)
Basophils Relative: 1 %
Eosinophils Absolute: 0.1 10*3/uL (ref 0.0–0.5)
Eosinophils Relative: 0 %
HCT: 38.8 % — ABNORMAL LOW (ref 39.0–52.0)
Hemoglobin: 12.6 g/dL — ABNORMAL LOW (ref 13.0–17.0)
Immature Granulocytes: 2 %
Lymphocytes Relative: 6 %
Lymphs Abs: 0.9 10*3/uL (ref 0.7–4.0)
MCH: 31.8 pg (ref 26.0–34.0)
MCHC: 32.5 g/dL (ref 30.0–36.0)
MCV: 98 fL (ref 80.0–100.0)
Monocytes Absolute: 2.1 10*3/uL — ABNORMAL HIGH (ref 0.1–1.0)
Monocytes Relative: 15 %
Neutro Abs: 10.3 10*3/uL — ABNORMAL HIGH (ref 1.7–7.7)
Neutrophils Relative %: 76 %
Platelets: 220 10*3/uL (ref 150–400)
RBC: 3.96 MIL/uL — ABNORMAL LOW (ref 4.22–5.81)
RDW: 14 % (ref 11.5–15.5)
WBC: 13.1 10*3/uL — ABNORMAL HIGH (ref 4.0–10.5)
nRBC: 0 % (ref 0.0–0.2)

## 2020-04-03 LAB — URINALYSIS, COMPLETE (UACMP) WITH MICROSCOPIC
Bilirubin Urine: NEGATIVE
Glucose, UA: NEGATIVE mg/dL
Ketones, ur: 5 mg/dL — AB
Nitrite: NEGATIVE
Protein, ur: >=300 mg/dL — AB
Specific Gravity, Urine: 1.014 (ref 1.005–1.030)
WBC, UA: >50 WBC/hpf — ABNORMAL HIGH (ref 0–5)
pH: 8 (ref 5.0–8.0)

## 2020-04-03 LAB — HEPARIN LEVEL (UNFRACTIONATED)
Heparin Unfractionated: 0.25 IU/mL — ABNORMAL LOW (ref 0.30–0.70)
Heparin Unfractionated: 0.29 IU/mL — ABNORMAL LOW (ref 0.30–0.70)

## 2020-04-03 LAB — BASIC METABOLIC PANEL
Anion gap: 8 (ref 5–15)
BUN: 94 mg/dL — ABNORMAL HIGH (ref 8–23)
CO2: 19 mmol/L — ABNORMAL LOW (ref 22–32)
Calcium: 8.5 mg/dL — ABNORMAL LOW (ref 8.9–10.3)
Chloride: 122 mmol/L — ABNORMAL HIGH (ref 98–111)
Creatinine, Ser: 2.79 mg/dL — ABNORMAL HIGH (ref 0.61–1.24)
GFR calc Af Amer: 23 mL/min — ABNORMAL LOW (ref >60)
GFR calc non Af Amer: 20 mL/min — ABNORMAL LOW (ref >60)
Glucose, Bld: 129 mg/dL — ABNORMAL HIGH (ref 70–99)
Potassium: 4.1 mmol/L (ref 3.5–5.1)
Sodium: 149 mmol/L — ABNORMAL HIGH (ref 135–145)

## 2020-04-03 LAB — GLUCOSE, CAPILLARY
Glucose-Capillary: 119 mg/dL — ABNORMAL HIGH (ref 70–99)
Glucose-Capillary: 125 mg/dL — ABNORMAL HIGH (ref 70–99)
Glucose-Capillary: 130 mg/dL — ABNORMAL HIGH (ref 70–99)
Glucose-Capillary: 165 mg/dL — ABNORMAL HIGH (ref 70–99)
Glucose-Capillary: 169 mg/dL — ABNORMAL HIGH (ref 70–99)
Glucose-Capillary: 179 mg/dL — ABNORMAL HIGH (ref 70–99)

## 2020-04-03 MED ORDER — CEFTRIAXONE SODIUM 1 G IJ SOLR
1.0000 g | INTRAMUSCULAR | Status: DC
  Administered 2020-04-03 – 2020-04-04 (×2): 1 g via INTRAMUSCULAR
  Filled 2020-04-03 (×3): qty 10

## 2020-04-03 MED ORDER — SODIUM CHLORIDE 0.45 % IV SOLN: INTRAVENOUS | Status: DC

## 2020-04-03 MED ORDER — METOPROLOL TARTRATE 25 MG PO TABS
12.5000 mg | ORAL_TABLET | Freq: Two times a day (BID) | ORAL | Status: DC
  Administered 2020-04-03 – 2020-04-05 (×4): 12.5 mg
  Filled 2020-04-03 (×4): qty 1

## 2020-04-03 NOTE — Progress Notes (Signed)
PT Cancellation Note  Patient Details Name: Richard [name] MRN: [id number] DOB: [birth year]   Cancelled Treatment:    Reason Eval/Treat Not Completed: Fatigue/lethargy limiting ability to participate(Chart reviewed, PT treatment attempted. Pt alseep on entry, minimally arousable to voice and gentle touch. Pt too sleepy to participate in PT at this time, will attempt again at later date/time.)  12:21 PM, 04/03/20 Etta Grandchild, PT, DPT Physical Therapist - [facility]  [phone] ([place])    [name] 04/03/2020, 12:21 PM

## 2020-04-03 NOTE — Progress Notes (Addendum)
Progress Note    Richard [name]  ZOX:[id number] DOB: 11-01-1931  DOA: 03/28/2020 PCP: [name], MD      Brief Narrative:    Medical records reviewed and are as summarized below:  Richard [name] is an 86 y.o. male  with medical history significant for paroxysmal A. 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       Assessment/Plan:   Principal Problem:   Acute CVA (cerebrovascular accident) ([place]) Active Problems:   PAF (paroxysmal atrial fibrillation) (HCC)   HTN (hypertension)   CKD (chronic kidney disease) stage 4, GFR 15-29 ml/min (HCC)   Hemiparesis affecting right side as late effect of cerebrovascular accident (CVA) ([place])   Type 2 diabetes mellitus with diabetic peripheral angiopathy without gangrene (HCC)   Neurogenic bladder   Chronic anticoagulation   Pressure injury of buttock, stage 1, left   Malnutrition of moderate degree    Acute CVA (cerebrovascular accident) ([place]) with right hemiparesis -MRI brain showed small acute infarct in the left basal ganglia and corona radiata.  Repeat CT head did not show any evidence of intracranial bleed. 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 -PT and OT recommend discharge to SNF  Dysphagia/moderate protein calorie malnutrition -Patient failed swallow evaluation.  Keep NPO.  NG tube was replaced by  radiologist on 04/03/2020.  Resume enteral nutrition.  Follow-up with dietitian.  Monitor electrolytes.   Diarrhea C. difficile toxin negative.  Lomotil as needed for diarrhea.    PAF (paroxysmal atrial fibrillation/  Chronic anticoagulation/ history of PE history of factor V Leiden deficiency Continue IV heparin infusion and monitor PTT per protocol.    HTN (hypertension) Continue metoprolol   AKI on CKD CKD  stage 4  Creatinine is slowly improving.  Continue IV fluids and monitor BMP  Hypernatremia: Treat with IV fluids.      Type 2 diabetes mellitus with hyperglycemia -Treat with Lantus and sliding scale insulin coverage    Neurogenic bladder -Can continue intermittent self-catheterization as needed.  Recent abnormal urinalysis/pyuria/cloudy urine Repeat urinalysis and start antibiotics if still abnormal because of strong suspicion for UTI.   Stage I left buttock decubitus ulcer Continue local wound care   Body mass index is 24.04 kg/m.    Pressure Injury 03/30/20 Buttocks Left Stage 1 -  Intact skin with non-blanchable redness of a localized area usually over a bony prominence. Moisture associated damage (Active)  03/30/20 0036  Location: Buttocks  Location Orientation: Left  Staging: Stage 1 -  Intact skin with non-blanchable redness of a localized area usually over a bony prominence.  Wound Description (Comments): Moisture associated damage  Present on Admission: Yes          Family Communication/Anticipated D/C date and plan/Code Status   DVT prophylaxis: Warfarin Code Status: DNR Family Communication: Plan discussed with patient Disposition Plan:    Status is: Inpatient  Remains inpatient appropriate because:Unsafe d/c plan  Dispo: The patient is from: Home              Anticipated d/c is to: SNF              Anticipated d/c date is: > 3 days              Patient currently is not medically stable to d/c.            Subjective:    Patient looks weak and is unable to provide any history at this time.   Objective:    Vitals:   04/02/20 2018 04/02/20 2353 04/03/20 0406 04/03/20 0500  BP: (!) 154/64 (!) 158/88 138/77   Pulse: 77 79 97   Resp: 16 18 18    Temp: 98.4 F (36.9 C) 98.3 F (36.8 C) 98.6 F (37 C)   TempSrc: Oral Oral Oral   SpO2: 94% 97% 96%   Weight:    80.4 kg  Height:        Intake/Output Summary (Last 24 hours) at 04/03/2020 1042 Last data filed at 04/03/2020 0628 Gross per 24 hour  Intake 2449.06 ml  Output 1400 ml  Net 1049.06 ml   Filed Weights   04/01/20 0500 04/02/20 0500 04/03/20 0500  Weight: 85.9 kg 84.3 kg 80.4 kg    Exam:  GEN: NAD SKIN: No rash.  Stage I left buttock decubitus ulcer present on admission EYES: EOMI ENT: MMM CV: RRR PULM: CTA B ABD: soft, ND, NT, +BS CNS: Lethargic, right facial droop,  EXT: No edema or tenderness GU: He has a condom catheter.  Urine in condom catheter and canister is very cloudy     Data Reviewed:   I have personally reviewed following labs and imaging studies:  Labs: Labs show the following:   Basic Metabolic Panel: Recent Labs  Lab 04/09/2020 0044 04/14/2020 0044 04/01/20 0413 04/01/20 0413 04/02/20 0457 04/03/20 0426  NA 140  --  144  --  145 149*  K 4.6   < > 4.6   < > 4.8 4.1  CL 109  --  114*  --  117* 122*  CO2 22  --  20*  --  19* 19*  GLUCOSE 158*  --  261*  --  220* 129*  BUN 50*  --  70*  --  89* 94*  CREATININE 2.24*  --  2.81*  --  3.10* 2.79*  CALCIUM 8.4*  --  8.6*  --  8.6* 8.5*  MG  --   --  1.7  --   --   --   PHOS  --   --  3.6  --   --   --    < > = values in this interval not displayed.   GFR Estimated Creatinine Clearance: 20.9 mL/min (A) (by C-G formula based on SCr of 2.79 mg/dL (H)). Liver Function Tests: Recent Labs  Lab 04/05/2020 0044  AST 22  ALT 17  ALKPHOS 95  BILITOT 0.6  PROT 7.0  ALBUMIN 2.9*   No results for input(s): LIPASE, AMYLASE in the last 168 hours. No results  for input(s): AMMONIA in the last 168 hours. Coagulation profile Recent Labs  Lab 04/06/2020 0142 03/30/20 0501 03/31/20 0547 04/01/20 0413 04/02/20 0457  INR 2.2* 2.2* 2.7* 2.8* 1.9*    CBC: Recent Labs  Lab 04/01/2020 0044 04/01/20 0413 04/03/20 0426  WBC 14.3* 13.5* 13.1*  NEUTROABS 9.0*  --  10.3*  HGB 13.5 13.8 12.6*  HCT 41.2  41.4 38.8*  MCV 98.3 96.5 98.0  PLT 221 234 220   Cardiac Enzymes: No results for input(s): CKTOTAL, CKMB, CKMBINDEX, TROPONINI in the last 168 hours. BNP (last 3 results) No results for input(s): PROBNP in the last 8760 hours. CBG: Recent Labs  Lab 04/02/20 1640 04/02/20 2020 04/02/20 2353 04/03/20 0406 04/03/20 0744  GLUCAP 106* 110* 127* 130* 125*   D-Dimer: No results for input(s): DDIMER in the last 72 hours. Hgb A1c: No results for input(s): HGBA1C in the last 72 hours. Lipid Profile: No results for input(s): CHOL, HDL, LDLCALC, TRIG, CHOLHDL, LDLDIRECT in the last 72 hours. Thyroid function studies: No results for input(s): TSH, T4TOTAL, T3FREE, THYROIDAB in the last 72 hours.  Invalid input(s): FREET3 Anemia work up: No results for input(s): VITAMINB12, FOLATE, FERRITIN, TIBC, IRON, RETICCTPCT in the last 72 hours. Sepsis Labs: Recent Labs  Lab 03/21/2020 0044 04/01/20 0413 04/03/20 0426  WBC 14.3* 13.5* 13.1*    Microbiology Recent Results (from the past 240 hour(s))  Respiratory Panel by RT PCR (Flu A&B, Covid) - Nasopharyngeal Swab     Status: None   Collection Time: 04/03/2020 12:52 AM   Specimen: Nasopharyngeal Swab  Result Value Ref Range Status   SARS Coronavirus 2 by RT PCR NEGATIVE NEGATIVE Final    Comment: (NOTE) SARS-CoV-2 target nucleic acids are NOT DETECTED. The SARS-CoV-2 RNA is generally detectable in upper respiratoy specimens during the acute phase of infection. The lowest concentration of SARS-CoV-2 viral copies this assay can detect is 131 copies/mL. A negative result does not preclude  SARS-Cov-2 infection and should not be used as the sole basis for treatment or other patient management decisions. A negative result may occur with  improper specimen collection/handling, submission of specimen other than nasopharyngeal swab, presence of viral mutation(s) within the areas targeted by this assay, and inadequate number of viral copies (<131 copies/mL). A negative result must be combined with clinical observations, patient history, and epidemiological information. The expected result is Negative. Fact Sheet for Patients:  PinkCheek.be Fact Sheet for Healthcare Providers:  GravelBags.it This test is not yet ap proved or cleared by the [place] FDA and  has been authorized for detection and/or diagnosis of SARS-CoV-2 by FDA under an Emergency Use Authorization (EUA). This EUA will remain  in effect (meaning this test can be used) for the duration of the COVID-19 declaration under Section 564(b)(1) of the Act, 21 U.S.C. section 360bbb-3(b)(1), unless the authorization is terminated or revoked sooner.    Influenza A by PCR NEGATIVE NEGATIVE Final   Influenza B by PCR NEGATIVE NEGATIVE Final    Comment: (NOTE) The Xpert Xpress SARS-CoV-2/FLU/RSV assay is intended as an aid in  the diagnosis of influenza from Nasopharyngeal swab specimens and  should not be used as a sole basis for treatment. Nasal washings and  aspirates are unacceptable for Xpert Xpress SARS-CoV-2/FLU/RSV  testing. Fact Sheet for Patients: PinkCheek.be Fact Sheet for Healthcare Providers: GravelBags.it This test is not yet approved or cleared by the [place] FDA and  has been authorized for detection and/or diagnosis of SARS-CoV-2 by  FDA under an Emergency Use Authorization (EUA). This EUA will remain  in effect (meaning this test can be used) for the duration of the  Covid-19  declaration under Section 564(b)(1) of the Act, 21  U.S.C. section 360bbb-3(b)(1), unless the authorization is  terminated or revoked. Performed at [facility], [place], [place] [address]   C Difficile Quick Screen w PCR  reflex     Status: None   Collection Time: 03/31/20 10:55 AM   Specimen: STOOL  Result Value Ref Range Status   C Diff antigen NEGATIVE NEGATIVE Final   C Diff toxin NEGATIVE NEGATIVE Final   C Diff interpretation No C. difficile detected.  Final    Comment: Performed at [facility], [address] [place], [place] [address]    Procedures and diagnostic studies:  DG Loyce Dys Tube Plc W/Fl W/Rad  Result Date: 04/03/2020 CLINICAL DATA:  NG tube placement. Dysphagia. Nursing staff was unsuccessful at placement of a nasogastric 2 on the floor. EXAM: NASO G TUBE PLACEMENT WITH FL AND WITH RAD FLUOROSCOPY TIME:  Fluoroscopy Time:  0.3 minute Radiation Exposure Index (if provided by the fluoroscopic device): 1.9 mGy Number of Acquired Spot Images: 0 COMPARISON:  None. FINDINGS: A Dobbhoff tube was inserted through the right nare and advanced into the stomach. Real-time fluoroscopy was utilized for confirmation of the Dobbhoff tube placement with the tip in the fundus of the stomach. No complications. IMPRESSION: Successful placement of a Dobbhoff tube with the tip in the stomach. Electronically Signed   By: Kathreen Devoid   On: 04/03/2020 09:06    Medications:   .  stroke: mapping our early stages of recovery book   Does not apply Once  . aspirin  81 mg Oral Daily  . atorvastatin  40 mg Oral Daily  . cholecalciferol  2,000 Units Oral Daily  . febuxostat  40 mg Oral QODAY  . feeding supplement (PRO-STAT SUGAR FREE 64)  30 mL Per Tube BID  . free water  150 mL Per Tube Q4H  . insulin aspart  0-9 Units Subcutaneous Q4H  . metoprolol tartrate  12.5 mg Oral BID   Continuous Infusions: . sodium chloride 100 mL/hr at 04/03/20 0920  . feeding  supplement (JEVITY 1.5 CAL/FIBER) Stopped (04/01/20 2030)  . heparin 1,300 Units/hr (04/03/20 0628)     LOS: 4 days   Kamara Allan  [facility]     04/03/2020, 10:42 AM            Progress Note    Richard [name]  GYB:[id number] DOB: [birth year]  DOA: 04/13/2020 PCP: [name], MD      Brief Narrative:    Medical records reviewed and are as summarized below:  Wendie Simmer is an 86 y.o. male  with medical history significant for paroxysmal A. 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       Assessment/Plan:   Principal Problem:   Acute CVA (cerebrovascular accident) ([place]) Active Problems:   PAF (paroxysmal atrial fibrillation) (HCC)   HTN (hypertension)   CKD (chronic kidney disease) stage 4, GFR 15-29 ml/min (HCC)   Hemiparesis affecting right side as late effect of cerebrovascular accident (CVA) ([place])   Type 2 diabetes mellitus with diabetic peripheral angiopathy without gangrene (HCC)   Neurogenic bladder   Chronic anticoagulation   Pressure injury of buttock, stage 1, left   Malnutrition of moderate degree    Acute CVA (cerebrovascular accident) ([place]) with right hemiparesis -MRI brain showed small acute infarct in the left basal ganglia and corona radiata.  Repeat CT head  did not show any evidence of intracranial bleed. 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 -PT and OT recommend discharge to  SNF  Dysphagia/moderate protein calorie malnutrition -Patient failed swallow evaluation.  Keep NPO.  Patient removed his NG tube yesterday.  Plan to have NG tube replaced by radiologist tomorrow.     AKI on CKD stage 4 Creatinine is worsening.  Restart IV fluids for hydration.  Lisinopril has been held.  Diarrhea C. difficile toxin negative.  Lomotil as needed for diarrhea.    Paroxysmal atrial fibrillation/ chronic anticoagulation/ history of PE/ history of factor V Leiden deficiency -IV heparin infusion has been substituted for Lovenox because of worsening kidney function.    HTN (hypertension) -Continue metoprolol if able      Type 2 diabetes mellitus with diabetic peripheral angiopathy without gangrene (HCC) -Sliding scale insulin coverage    Neurogenic bladder -Can continue intermittent self-catheterization as needed.   Gout On Febuxostat  Stage I left buttock decubitus ulcer Continue local wound care  Right forearm swelling from IV infiltration Resolved.   Body mass index is 24.04 kg/m.    Pressure Injury 03/30/20 Buttocks Left Stage 1 -  Intact skin with non-blanchable redness of a localized area usually over a bony prominence. Moisture associated damage (Active)  03/30/20 0036  Location: Buttocks  Location Orientation: Left  Staging: Stage 1 -  Intact skin with non-blanchable redness of a localized area usually over a bony prominence.  Wound Description (Comments): Moisture associated damage  Present on Admission: Yes          Family Communication/Anticipated D/C date and plan/Code Status   DVT prophylaxis: Warfarin Code Status: DNR Family Communication: Plan discussed with patient Disposition Plan:    Status is: Inpatient  Remains inpatient appropriate because:Unsafe d/c plan   Dispo: The patient is from: Home              Anticipated d/c is to: SNF              Anticipated d/c date is: > 3 days              Patient currently is not  medically stable to d/c.            Subjective:   Patient is unable to provide any history.   Objective:    Vitals:   04/02/20 2018 04/02/20 2353 04/03/20 0406 04/03/20 0500  BP: (!) 154/64 (!) 158/88 138/77   Pulse: 77 79 97   Resp: 16 18 18    Temp: 98.4 F (36.9 C) 98.3 F (36.8 C) 98.6 F (37 C)   TempSrc: Oral Oral Oral   SpO2: 94% 97% 96%   Weight:    80.4 kg  Height:        Intake/Output Summary (Last 24 hours) at 04/03/2020 1042 Last data filed at 04/03/2020 0628 Gross per 24 hour  Intake 2449.06 ml  Output 1400 ml  Net 1049.06 ml   Filed Weights   04/01/20 0500 04/02/20 0500 04/03/20 0500  Weight: 85.9 kg 84.3 kg 80.4 kg    Exam:  GEN: No acute distress  SKIN: No rash.  Stage I left buttock decubitus ulcer present on admission EYES: EOMI ENT: MMM CV: RRR PULM: No wheezing or rales heard ABD: soft, ND, NT, +BS CNS: Drowsy but arousable.  He talks in a whispered voice and speech is barely audible.  Right facial droop EXT: Right forearm swelling has resolved     Data Reviewed:   I have personally reviewed following labs and imaging studies:  Labs: Labs show the following:   Basic Metabolic Panel: Recent Labs  Lab 04/11/2020 0044 03/17/2020 0044 04/01/20 0413 04/01/20 0413 04/02/20 0457 04/03/20 0426  NA 140  --  144  --  145 149*  K 4.6   < > 4.6   < > 4.8 4.1  CL 109  --  114*  --  117* 122*  CO2 22  --  20*  --  19* 19*  GLUCOSE 158*  --  261*  --  220* 129*  BUN 50*  --  70*  --  89* 94*  CREATININE 2.24*  --  2.81*  --  3.10* 2.79*  CALCIUM 8.4*  --  8.6*  --  8.6* 8.5*  MG  --   --  1.7  --   --   --   PHOS  --   --  3.6  --   --   --    < > = values in this interval not displayed.   GFR Estimated Creatinine Clearance: 20.9 mL/min (A) (by C-G formula based on SCr of 2.79 mg/dL (H)). Liver Function Tests: Recent Labs  Lab 04/06/2020 0044  AST 22  ALT 17  ALKPHOS 95  BILITOT 0.6  PROT 7.0  ALBUMIN 2.9*   No results  for input(s): LIPASE, AMYLASE in the last 168 hours. No results for input(s): AMMONIA in the last 168 hours. Coagulation profile Recent Labs  Lab 03/28/2020 0142 03/30/20 0501 03/31/20 0547 04/01/20 0413 04/02/20 0457  INR 2.2* 2.2* 2.7* 2.8* 1.9*    CBC: Recent Labs  Lab 03/18/2020 0044 04/01/20 0413 04/03/20 0426  WBC 14.3* 13.5* 13.1*  NEUTROABS 9.0*  --  10.3*  HGB 13.5 13.8 12.6*  HCT 41.2 41.4 38.8*  MCV 98.3 96.5 98.0  PLT 221 234 220   Cardiac Enzymes: No results for input(s): CKTOTAL, CKMB, CKMBINDEX, TROPONINI in the last 168 hours. BNP (last 3 results) No results for input(s): PROBNP in the last 8760 hours. CBG: Recent Labs  Lab 04/02/20 1640 04/02/20 2020 04/02/20 2353 04/03/20 0406 04/03/20 0744  GLUCAP 106* 110* 127* 130* 125*   D-Dimer: No results for input(s): DDIMER in the last 72 hours. Hgb A1c: No results for input(s): HGBA1C in the last 72 hours. Lipid Profile: No results for input(s): CHOL, HDL, LDLCALC, TRIG, CHOLHDL, LDLDIRECT in the last 72 hours. Thyroid function studies: No results for input(s): TSH, T4TOTAL, T3FREE, THYROIDAB in the last 72 hours.  Invalid input(s): FREET3 Anemia work up: No results for input(s): VITAMINB12, FOLATE, FERRITIN, TIBC, IRON, RETICCTPCT in the last 72 hours. Sepsis Labs: Recent Labs  Lab 03/26/2020 0044 04/01/20 0413 04/03/20 0426  WBC 14.3* 13.5* 13.1*    Microbiology Recent Results (from the past 240 hour(s))  Respiratory Panel by RT PCR (Flu A&B, Covid) - Nasopharyngeal Swab     Status: None   Collection Time: 04/01/2020 12:52 AM   Specimen: Nasopharyngeal Swab  Result Value Ref Range Status   SARS Coronavirus 2 by RT PCR NEGATIVE NEGATIVE Final    Comment: (NOTE) SARS-CoV-2 target nucleic acids are NOT DETECTED. The SARS-CoV-2 RNA is generally detectable in upper respiratoy specimens during the acute phase of infection. The lowest concentration of SARS-CoV-2 viral copies this assay can detect  is 131 copies/mL.  A negative result does not preclude SARS-Cov-2 infection and should not be used as the sole basis for treatment or other patient management decisions. A negative result may occur with  improper specimen collection/handling, submission of specimen other than nasopharyngeal swab, presence of viral mutation(s) within the areas targeted by this assay, and inadequate number of viral copies (<131 copies/mL). A negative result must be combined with clinical observations, patient history, and epidemiological information. The expected result is Negative. Fact Sheet for Patients:  PinkCheek.be Fact Sheet for Healthcare Providers:  GravelBags.it This test is not yet ap proved or cleared by the [place] FDA and  has been authorized for detection and/or diagnosis of SARS-CoV-2 by FDA under an Emergency Use Authorization (EUA). This EUA will remain  in effect (meaning this test can be used) for the duration of the COVID-19 declaration under Section 564(b)(1) of the Act, 21 U.S.C. section 360bbb-3(b)(1), unless the authorization is terminated or revoked sooner.    Influenza A by PCR NEGATIVE NEGATIVE Final   Influenza B by PCR NEGATIVE NEGATIVE Final    Comment: (NOTE) The Xpert Xpress SARS-CoV-2/FLU/RSV assay is intended as an aid in  the diagnosis of influenza from Nasopharyngeal swab specimens and  should not be used as a sole basis for treatment. Nasal washings and  aspirates are unacceptable for Xpert Xpress SARS-CoV-2/FLU/RSV  testing. Fact Sheet for Patients: PinkCheek.be Fact Sheet for Healthcare Providers: GravelBags.it This test is not yet approved or cleared by the [place] FDA and  has been authorized for detection and/or diagnosis of SARS-CoV-2 by  FDA under an Emergency Use Authorization (EUA). This EUA will remain  in effect (meaning this  test can be used) for the duration of the  Covid-19 declaration under Section 564(b)(1) of the Act, 21  U.S.C. section 360bbb-3(b)(1), unless the authorization is  terminated or revoked. Performed at [facility], [place], [place] [address]   C Difficile Quick Screen w PCR reflex     Status: None   Collection Time: 03/31/20 10:55 AM   Specimen: STOOL  Result Value Ref Range Status   C Diff antigen NEGATIVE NEGATIVE Final   C Diff toxin NEGATIVE NEGATIVE Final   C Diff interpretation No C. difficile detected.  Final    Comment: Performed at [facility], [address] [place], [place] [address]    Procedures and diagnostic studies:  DG Loyce Dys Tube Plc W/Fl W/Rad  Result Date: 04/03/2020 CLINICAL DATA:  NG tube placement. Dysphagia. Nursing staff was unsuccessful at placement of a nasogastric 2 on the floor. EXAM: NASO G TUBE PLACEMENT WITH FL AND WITH RAD FLUOROSCOPY TIME:  Fluoroscopy Time:  0.3 minute Radiation Exposure Index (if provided by the fluoroscopic device): 1.9 mGy Number of Acquired Spot Images: 0 COMPARISON:  None. FINDINGS: A Dobbhoff tube was inserted through the right nare and advanced into the stomach. Real-time fluoroscopy was utilized for confirmation of the Dobbhoff tube placement with the tip in the fundus of the stomach. No complications. IMPRESSION: Successful placement of a Dobbhoff tube with the tip in the stomach. Electronically Signed   By: Kathreen Devoid   On: 04/03/2020 09:06    Medications:   .  stroke: mapping our early stages of recovery book   Does not apply Once  . aspirin  81 mg Oral Daily  . atorvastatin  40 mg Oral Daily  . cholecalciferol  2,000 Units Oral Daily  . febuxostat  40 mg Oral QODAY  .  feeding supplement (PRO-STAT SUGAR FREE 64)  30 mL Per Tube BID  . free water  150 mL Per Tube Q4H  . insulin aspart  0-9 Units Subcutaneous Q4H  . metoprolol tartrate  12.5 mg Oral BID   Continuous Infusions: .  sodium chloride 100 mL/hr at 04/03/20 0920  . feeding supplement (JEVITY 1.5 CAL/FIBER) Stopped (04/01/20 2030)  . heparin 1,300 Units/hr (04/03/20 0628)     LOS: 4 days   Mikel Pyon  [facility]     04/03/2020, 10:42 AM

## 2020-04-03 NOTE — Progress Notes (Signed)
ANTICOAGULATION CONSULT NOTE - Follow Up Consult  Pharmacy Consult for Warfarin --> heparin Indication: atrial fibrillation and VTE treatment  No Known Allergies  Patient Measurements: Height: 6' (182.9 cm) Weight: 80.4 kg (177 lb 4 oz) IBW/kg (Calculated) : 77.6  Vital Signs: Temp: 98.6 F (37 C) (04/19 0406) Temp Source: Oral (04/19 0406) BP: 138/77 (04/19 0406) Pulse Rate: 97 (04/19 0406)  Labs: Recent Labs    04/01/20 0413 04/02/20 0457 04/02/20 0917 04/02/20 1457 04/02/20 2311 04/03/20 0426 04/03/20 0850  HGB 13.8  --   --   --   --  12.6*  --   HCT 41.4  --   --   --   --  38.8*  --   PLT 234  --   --   --   --  220  --   LABPROT 29.7* 22.0*  --   --   --   --   --   INR 2.8* 1.9*  --   --   --   --   --   HEPARINUNFRC  --   --    < > 0.44 0.25*  --  0.25*  CREATININE 2.81* 3.10*  --   --   --  2.79*  --    < > = values in this interval not displayed.    Estimated Creatinine Clearance: 20.9 mL/min (A) (by C-G formula based on SCr of 2.79 mg/dL (H)).  Assessment: Patient is a 86yo male admitted for CVA. Patient has a h/o Factor V Leiden deficiency, PE, afib. Patient was taking Warfarin prior to admission. Pharmacy is consulted to manage Warfarin therapy.  Patient unable to swallow, cannot crush warfarin. Will transition to IV anticoagulation when INR < 2.0  Home Dose: Warfarin 5mg  daily, last dose PTA 4/13 per med rec  Date      INR     Dose 4/14        2.2       Not given (dysphagia, NPO) 4/15        2.2    None given (order expired) 4/16     2.7    None Given 4/17    2.8    None given 4/18    1.9  NR < 2 Discussed with hospitalitist, started on heparin at this time due to worsening renal function.  Started heparin at 1,100 units per hour with a 4,000 unit bolus.  4/18 1457 HL 0.44 - 1100 units/hr continued 4/18 2311 HL 0.25 - increased to 1300 units/hr 4/19 0850 HL 0.25  Goal of Therapy:  Heparin level: 0.3-0.7   Plan:  HL 0.25 - subtherapeutic,  will increase to 1500 units/hr  Will check anti-Xa in 8 hours and CBC with AM labs.     Lu Duffel, PharmD, BCPS Clinical Pharmacist 04/03/2020 9:44 AM

## 2020-04-03 NOTE — Progress Notes (Signed)
D: Pt alert and oriented x 1(self). Heparin rate change was verified from 15 to 17 by shift change RN Engineer, manufacturing. Pt is very soft spoken, to a whisper.   A: Scheduled medications administered to pt, per MD orders. Support and encouragement provided. Frequent verbal contact made.    R: No adverse drug reactions noted. Pt complaint with medications and treatment plan. Pt interacts well with others on the unit. Pt is stable at this time, Will continue to monitor and provide care for as ordered.

## 2020-04-03 NOTE — Progress Notes (Signed)
SLP Cancellation Note  Patient Details Name: Richard [name] MRN: [id number] DOB: 1931/08/24   Cancelled treatment:       Reason Eval/Treat Not Completed: (chart reviewed; consulted MD, NSG notes.). Per report, pt's NG had come/pulled out yesterday and had to be replaced this AM in Radiology. With it just being replaced, will hold on MBSS until tomorrow allowing for another day for improvement. Discussed w/ MD that the NG will have to be removed for the MBSS.  MD agreed. ST services will f/u w/ MBSS on Tuesday, 04/03/2020. Recommend frequent oral care for hygiene and stimulation of swallowing.     Orinda Kenner, [place], CCC-SLP Khalila Buechner 04/03/2020, 2:41 PM

## 2020-04-03 NOTE — Progress Notes (Signed)
ANTICOAGULATION CONSULT NOTE - Follow Up Consult  Pharmacy Consult for Warfarin --> heparin Indication: atrial fibrillation and VTE treatment  No Known Allergies  Patient Measurements: Height: 6' (182.9 cm) Weight: 84.3 kg (185 lb 13.6 oz) IBW/kg (Calculated) : 77.6  Vital Signs: Temp: 98.3 F (36.8 C) (04/18 2353) Temp Source: Oral (04/18 2353) BP: 158/88 (04/18 2353) Pulse Rate: 79 (04/18 2353)  Labs: Recent Labs    03/31/20 0547 04/01/20 0413 04/02/20 0457 04/02/20 0917 04/02/20 1457 04/02/20 2311  HGB  --  13.8  --   --   --   --   HCT  --  41.4  --   --   --   --   PLT  --  234  --   --   --   --   LABPROT 28.4* 29.7* 22.0*  --   --   --   INR 2.7* 2.8* 1.9*  --   --   --   HEPARINUNFRC  --   --   --  <0.10* 0.44 0.25*  CREATININE  --  2.81* 3.10*  --   --   --     Estimated Creatinine Clearance: 18.8 mL/min (A) (by C-G formula based on SCr of 3.1 mg/dL (H)).  Assessment: Patient is a 86yo male admitted for CVA. Patient has a h/o Factor V Leiden deficiency, PE, afib. Patient was taking Warfarin prior to admission. Pharmacy is consulted to manage Warfarin therapy.  Patient unable to swallow, cannot crush warfarin. Will transition to IV anticoagulation when INR < 2.0  Home Dose: Warfarin 5mg  daily, last dose PTA 4/13 per med rec  Date      INR     Dose 4/14        2.2       Not given (dysphagia, NPO) 4/15        2.2    None given (order expired) 4/16     2.7    None Given 4/17    2.8    None given 4/18    1.9  Goal of Therapy:  Heparin level: 0.3-0.7   Plan:  INR < 2 Discussed with hospitalitist, will start on heparin at this time due to worsening renal function.  Will start heparin at 1,100 units per hour with a 4,000 unit bolus. Will check anti-Xa in 8 hours and CBC with AM labs.   4/18:  HL @ 1457 = 0.44 Will continue pt on current rate and draw confirmation level on 4/18 @ 2300.   0418 2311 HL 0.25, subtherapeutic.  Will increase Heparin to 1300  units/hr and recheck HL in 8 hours  Hart Robinsons A Clinical Pharmacist 04/03/2020

## 2020-04-03 NOTE — Progress Notes (Signed)
Occupational Therapy Treatment Patient Details Name: Richard [name] MRN: [id number] DOB: August 09, 1931 Today's Date: 04/03/2020    History of present illness Richard [name] is an 25yoM who comes to [facility] 4/14 c acute onset right hemi weakness. Pt reports inability to raise the right arm or move the right leg as well as right-sided facial droop with drooling and slurred speech. By time of arrival, symptoms mostly resolved. At baseline patient ambulates with a cane. 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 MRI shows: Small acute perforator infarction at the left basal ganglia and corona radiata. Thin (2 mm) subdural collection or dural thickening along the right frontal parietal convexity, likely subacute hematoma.   OT comments  Pt seen for OT tx this date to f/u re: safety/independence with self care ADLs/ADL mobility. Pt very lethargic this date and it is difficult to engage him in sup<>sit transition as OT initially attempts in order to complete ADLs in sitting. OT opts to have pt complete ADLs at bed level with HOB elevated. OT first engages pt in re-positioning/propulsion towards [facility] with bed in trendelenburg (feeding tube temporarily paused for this) and pt able to make minimal contribution by attempting to push through L LE, but ultimately requires TOTAL A. Once this is completed, OT elevates HOB and removes pt's L mitt to engage pt in grooming/hygiene tasks. Pt requires initiation cues/hand over hand occasionally, and MOD A. Pt does make concerted effort to thoroughly complete oral care. Pt's mitt returned to L hand, bed alarm set, and feeding tube re-initiated at end of session. Ultimately, SNF remains most appropriate d/c recommendation.   Follow Up Recommendations  SNF    Equipment Recommendations   Other (comment)(TBD)    Recommendations for Other Services      Precautions / Restrictions Precautions Precautions: Fall Precaution Comments: High Fall Restrictions Weight Bearing Restrictions: No       Mobility Bed Mobility               General bed mobility comments: deferred this date, pt with limited participation in attempt to come to EOB sitting with OT this date 2/2 lethargy.  Transfers                 General transfer comment: deferred    Balance                                           ADL either performed or assessed with clinical judgement   ADL Overall ADL's : Needs assistance/impaired     Grooming: Wash/dry hands;Wash/dry face;Oral care;Minimal assistance;Moderate assistance;Bed level;Cueing for sequencing Grooming Details (indicate cue type and reason): HOB elevated, increased time, cues to initiate, HOH to initiate, pt eventually able to take over on some level, but limited in thoroughness d/t gross weakness of L UE.                                     Vision   Additional Comments: difficult to formally assess, limited eye opening and visual attention throughout   Perception     Praxis      Cognition Arousal/Alertness: Lethargic Behavior During Therapy:  Flat affect Overall Cognitive Status: Difficult to assess                                 General Comments: Some communication (yes/no) appears to be clear and appropriate. Pt very lethargic throughout, difficult to elicit responses to questions. Very quiet voice when he does answer.        Exercises Other Exercises Other Exercises: OT facilitates education with pt re: importance of attempting to compelte self care ADLs as I'ly as possible to maintain his current fxl level   Shoulder Instructions       General Comments      Pertinent Vitals/ Pain       Pain Assessment: No/denies pain  Home Living                                           Prior Functioning/Environment              Frequency  Min 2X/week        Progress Toward Goals  OT Goals(current goals can now be found in the care plan section)  Progress towards OT goals: OT to reassess next treatment  Acute Rehab OT Goals Patient Stated Goal: return to baseline PLOF OT Goal Formulation: With patient Time For Goal Achievement: 04/13/20 Potential to Achieve Goals: Good  Plan Discharge plan remains appropriate    Co-evaluation                 AM-PAC OT "6 Clicks" Daily Activity     Outcome Measure   Help from another person eating meals?: A Little Help from another person taking care of personal grooming?: A Little Help from another person toileting, which includes using toliet, bedpan, or urinal?: A Lot Help from another person bathing (including washing, rinsing, drying)?: A Lot Help from another person to put on and taking off regular upper body clothing?: A Lot Help from another person to put on and taking off regular lower body clothing?: A Lot 6 Click Score: 14    End of Session    OT Visit Diagnosis: Other abnormalities of gait and mobility (R26.89);Hemiplegia and hemiparesis Hemiplegia - Right/Left: Right Hemiplegia - dominant/non-dominant: Dominant Hemiplegia - caused by: Cerebral infarction   Activity Tolerance Patient limited by lethargy   Patient Left in bed;with call bell/phone within reach;with nursing/sitter in room;Other (comment)(with mittens re-applied)   Nurse Communication Other (comment)(lethargy)        Time: 9450-3888 OT Time Calculation (min): 29 min  Charges: OT General Charges $OT Visit: 1 Visit OT Treatments $Self Care/Home Management : 23-37 mins  Gerrianne Scale, [place], OTR/L ascom [phone] 04/03/20, 5:11 PM

## 2020-04-03 NOTE — Progress Notes (Signed)
ANTICOAGULATION CONSULT NOTE - Follow Up Consult  Pharmacy Consult for Warfarin --> heparin Indication: atrial fibrillation and VTE treatment  No Known Allergies  Patient Measurements: Height: 6' (182.9 cm) Weight: 80.4 kg (177 lb 4 oz) IBW/kg (Calculated) : 77.6  Vital Signs: Temp: 98.5 F (36.9 C) (04/19 1616) Temp Source: Oral (04/19 1616) BP: 128/72 (04/19 1616) Pulse Rate: 82 (04/19 1616)  Labs: Recent Labs    04/01/20 0413 04/02/20 0457 04/02/20 0917 04/02/20 2311 04/03/20 0426 04/03/20 0850 04/03/20 1812  HGB 13.8  --   --   --  12.6*  --   --   HCT 41.4  --   --   --  38.8*  --   --   PLT 234  --   --   --  220  --   --   LABPROT 29.7* 22.0*  --   --   --   --   --   INR 2.8* 1.9*  --   --   --   --   --   HEPARINUNFRC  --   --    < > 0.25*  --  0.25* 0.29*  CREATININE 2.81* 3.10*  --   --  2.79*  --   --    < > = values in this interval not displayed.    Estimated Creatinine Clearance: 20.9 mL/min (A) (by C-G formula based on SCr of 2.79 mg/dL (H)).  Assessment: Patient is a 86yo male admitted for CVA. Patient has a h/o Factor V Leiden deficiency, PE, afib. Patient was taking Warfarin prior to admission. Pharmacy is consulted to manage Warfarin therapy.  Patient unable to swallow, cannot crush warfarin. Will transition to IV anticoagulation when INR < 2.0  Home Dose: Warfarin 5mg  daily, last dose PTA 4/13 per med rec  Date      INR     Dose 4/14        2.2       Not given (dysphagia, NPO) 4/15        2.2    None given (order expired) 4/16     2.7    None Given 4/17    2.8    None given 4/18    1.9  NR < 2 Discussed with hospitalitist, started on heparin at this time due to worsening renal function.  Started heparin at 1,100 units per hour with a 4,000 unit bolus.  4/18 1457 HL 0.44 - 1100 units/hr continued 4/18 2311 HL 0.25 - increased to 1300 units/hr 4/19 0850 HL 0.25 will increase to 1500 units/hr 4/19 1812 HL 0.29  Goal of Therapy:  Heparin  level: 0.3-0.7   Plan:  4/19 1812  HL 0.29 - subtherapeutic, will increase to 1700 units/hr  Will check anti-Xa in 8 hours and CBC with AM labs.    Noralee Space, PharmD, BCPS Clinical Pharmacist 04/03/2020 7:05 PM

## 2020-04-03 NOTE — Care Management Important Message (Signed)
Important Message  Patient Details  Name: Richard [name] MRN: [id number] Date of Birth: Apr 12, 1931   Medicare Important Message Given:  Yes     Juliann Pulse A Jody Aguinaga 04/03/2020, 11:10 AM

## 2020-04-04 ENCOUNTER — Inpatient Hospital Stay: Payer: Medicare Other

## 2020-04-04 DIAGNOSIS — Z7901 Long term (current) use of anticoagulants: Secondary | ICD-10-CM | POA: Diagnosis not present

## 2020-04-04 LAB — CBC WITH DIFFERENTIAL/PLATELET
Abs Immature Granulocytes: 0.16 10*3/uL — ABNORMAL HIGH (ref 0.00–0.07)
Basophils Absolute: 0.1 10*3/uL (ref 0.0–0.1)
Basophils Relative: 1 %
Eosinophils Absolute: 0.1 10*3/uL (ref 0.0–0.5)
Eosinophils Relative: 1 %
HCT: 37.2 % — ABNORMAL LOW (ref 39.0–52.0)
Hemoglobin: 12.2 g/dL — ABNORMAL LOW (ref 13.0–17.0)
Immature Granulocytes: 1 %
Lymphocytes Relative: 6 %
Lymphs Abs: 1 10*3/uL (ref 0.7–4.0)
MCH: 31.9 pg (ref 26.0–34.0)
MCHC: 32.8 g/dL (ref 30.0–36.0)
MCV: 97.4 fL (ref 80.0–100.0)
Monocytes Absolute: 2.1 10*3/uL — ABNORMAL HIGH (ref 0.1–1.0)
Monocytes Relative: 13 %
Neutro Abs: 12.3 10*3/uL — ABNORMAL HIGH (ref 1.7–7.7)
Neutrophils Relative %: 78 %
Platelets: 221 10*3/uL (ref 150–400)
RBC: 3.82 MIL/uL — ABNORMAL LOW (ref 4.22–5.81)
RDW: 14.2 % (ref 11.5–15.5)
Smear Review: NORMAL
WBC: 15.7 10*3/uL — ABNORMAL HIGH (ref 4.0–10.5)
nRBC: 0 % (ref 0.0–0.2)

## 2020-04-04 LAB — GLUCOSE, CAPILLARY
Glucose-Capillary: 204 mg/dL — ABNORMAL HIGH (ref 70–99)
Glucose-Capillary: 162 mg/dL — ABNORMAL HIGH (ref 70–99)
Glucose-Capillary: 206 mg/dL — ABNORMAL HIGH (ref 70–99)
Glucose-Capillary: 146 mg/dL — ABNORMAL HIGH (ref 70–99)
Glucose-Capillary: 199 mg/dL — ABNORMAL HIGH (ref 70–99)

## 2020-04-04 LAB — BASIC METABOLIC PANEL
Anion gap: 8 (ref 5–15)
BUN: 98 mg/dL — ABNORMAL HIGH (ref 8–23)
CO2: 22 mmol/L (ref 22–32)
Calcium: 8.5 mg/dL — ABNORMAL LOW (ref 8.9–10.3)
Chloride: 119 mmol/L — ABNORMAL HIGH (ref 98–111)
Creatinine, Ser: 2.81 mg/dL — ABNORMAL HIGH (ref 0.61–1.24)
GFR calc Af Amer: 23 mL/min — ABNORMAL LOW (ref >60)
GFR calc non Af Amer: 19 mL/min — ABNORMAL LOW (ref >60)
Glucose, Bld: 214 mg/dL — ABNORMAL HIGH (ref 70–99)
Potassium: 4.4 mmol/L (ref 3.5–5.1)
Sodium: 149 mmol/L — ABNORMAL HIGH (ref 135–145)

## 2020-04-04 LAB — HEPARIN LEVEL (UNFRACTIONATED)
Heparin Unfractionated: 0.49 IU/mL (ref 0.30–0.70)
Heparin Unfractionated: 0.52 IU/mL (ref 0.30–0.70)

## 2020-04-04 MED ORDER — DEXTROSE 5 % IV SOLN: INTRAVENOUS | Status: DC

## 2020-04-04 MED ORDER — INSULIN GLARGINE 100 UNIT/ML ~~LOC~~ SOLN
20.0000 [IU] | Freq: Once | SUBCUTANEOUS | Status: AC
  Administered 2020-04-04: 10:00:00 20 [IU] via SUBCUTANEOUS
  Filled 2020-04-04: qty 0.2

## 2020-04-04 NOTE — Progress Notes (Addendum)
PT Cancellation Note  Patient Details Name: Richard [name] MRN: [id number] DOB: 18-Jan-1931   Cancelled Treatment:    Reason Eval/Treat Not Completed: Medical issues which prohibited therapy;Fatigue/lethargy limiting ability to participate(Attempted to see patient for treatment. Pt's Son and primary caregiver Rodena Piety) in room upon entry. Pt continues to be very lethargic, eyes open but minimally responsive. Pt following less than 25% of commands. Extensive cues to elicit LUE and LLE AA/ROM. Affected side has trace only activation of RUE grip and trace combined extension in RLE. RUE swelling appears worse than 2DA, more generalized, and falls bracelet now tight without slack but not restricting (RN made aware.) Pt not able to participate at this time. Author attempted to answer many of visitors' questions, but redirected some of them to nursing as appropriate. Pt has more global weakness and lethargy, hence more pronounced weakness of Rt hemi body.   3:30 PM, 04/04/20 Etta Grandchild, PT, DPT Physical Therapist - [facility]  [phone] ([place])   [place] C 04/04/2020, 3:27 PM

## 2020-04-04 NOTE — TOC Progression Note (Signed)
Transition of Care [facility]) - Progression Note    Patient Details  Name: KENSTON LONGTON MRN: [id number] Date of Birth: [birth year]  Transition of Care [facility]) CM/SW [place], RN Phone Number: 04/04/2020, 3:36 PM  Clinical Narrative:     Met with the patient again, he has worsened in his alertness and does open his eyes when I touch him and call his name, I asked him if he wanted to go to rehab and he agrees by shaking his head, I attempted to call Rodena Piety his caregiver and prove the information for her, I called and left her a VM requesting a call back and provided my phone number, Bed search started, FL2 done and PASSR  Expected Discharge Plan: [place] Barriers to Discharge: Continued Medical Work up  Expected Discharge Plan and Services Expected Discharge Plan: [place]   Discharge Planning Services: CM Consult   Living arrangements for the past 2 months: Single Family Home                 DME Arranged: N/A         HH Arranged: RN, PT, OT, Nurse's Aide, Social Work [employer] Agency: [place] ([place]) Date [place]: 03/30/20 Time HH Agency Contacted: 1500 Representative spoke with at [place]: [place] ([place]) Interventions    Readmission Risk Interventions No flowsheet data found.

## 2020-04-04 NOTE — Progress Notes (Signed)
Progress Note    Richard [name]  [id number]:[id number] DOB: [birth year]  DOA: 04/11/2020 PCP: [name], MD      Brief Narrative:    Medical records reviewed and are as summarized below:  Richard [name] is an 86 y.o. male  with medical history significant for paroxysmal A. 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       Assessment/Plan:   Principal Problem:   Acute CVA (cerebrovascular accident) ([place]) Active Problems:   PAF (paroxysmal atrial fibrillation) (HCC)   HTN (hypertension)   CKD (chronic kidney disease) stage 4, GFR 15-29 ml/min (HCC)   Hemiparesis affecting right side as late effect of cerebrovascular accident (CVA) ([place])   Type 2 diabetes mellitus with diabetic peripheral angiopathy without gangrene (HCC)   Neurogenic bladder   Chronic anticoagulation   Pressure injury of buttock, stage 1, left   Malnutrition of moderate degree    Acute CVA (cerebrovascular accident) ([place]) with right hemiparesis -MRI brain showed small acute infarct in the left basal ganglia and corona radiata.  Repeat CT head did not show any evidence of intracranial bleed. 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 -PT and OT recommend discharge to SNF  Dysphagia/moderate protein calorie malnutrition -Patient failed swallow evaluation.  Keep NPO.  NG tube was replaced by  radiologist on 04/03/2020.  Resume enteral nutrition.  Follow-up with dietitian.  Monitor electrolytes.   Diarrhea C. difficile toxin negative.  Lomotil as needed for diarrhea.    PAF (paroxysmal atrial fibrillation/  Chronic anticoagulation/ history of PE history of factor V Leiden deficiency Continue IV heparin infusion and monitor PTT per protocol.    HTN (hypertension) Continue metoprolol   AKI on CKD CKD  stage 4  Creatinine is slowly improving.  Continue IV fluids and monitor BMP  Hypernatremia: Treat with IV fluids.      Type 2 diabetes mellitus with hyperglycemia -Treat with Lantus and sliding scale insulin coverage    Neurogenic bladder -Can continue intermittent self-catheterization as needed.  Recent abnormal urinalysis/pyuria/cloudy urine Repeat urinalysis and start antibiotics if still abnormal because of strong suspicion for UTI.   Stage I left buttock decubitus ulcer Continue local wound care   Body mass index is 25.24 kg/m.    Pressure Injury 03/30/20 Buttocks Left Stage 1 -  Intact skin with non-blanchable redness of a localized area usually over a bony prominence. Moisture associated damage (Active)  03/30/20 0036  Location: Buttocks  Location Orientation: Left  Staging: Stage 1 -  Intact skin with non-blanchable redness of a localized area usually over a bony prominence.  Wound Description (Comments): Moisture associated damage  Present on Admission: Yes          Family Communication/Anticipated D/C date and plan/Code Status   DVT prophylaxis: Warfarin Code Status: DNR Family Communication: Plan discussed with patient Disposition Plan:    Status is: Inpatient  Remains inpatient appropriate because:Unsafe d/c plan  Dispo: The patient is from: Home              Anticipated d/c is to: SNF              Anticipated d/c date is: > 3 days              Patient currently is not medically stable to d/c.            Subjective:    Patient looks weak and is unable to provide any history at this time.   Objective:    Vitals:   04/03/20 2345 04/04/20 0600 04/04/20 0810 04/04/20 1234  BP: (!) 157/98  (!) 153/85 (!) 146/55  Pulse: 88  77 79  Resp: 18  19 20   Temp: 98.2 F (36.8 C)  98.6 F (37 C) 98.6 F (37 C)  TempSrc: Oral  Oral Axillary  SpO2: 94%  97% 97%  Weight:  84.4 kg    Height:        Intake/Output Summary (Last 24 hours) at 04/04/2020 1244 Last data filed at 04/04/2020 0600 Gross per 24 hour  Intake 2726.93 ml  Output 900 ml  Net 1826.93 ml   Filed Weights   04/02/20 0500 04/03/20 0500 04/04/20 0600  Weight: 84.3 kg 80.4 kg 84.4 kg    Exam:  GEN: NAD SKIN: No rash.  Stage I left buttock decubitus ulcer present on admission EYES: EOMI ENT: MMM CV: RRR PULM: CTA B ABD: soft, ND, NT, +BS CNS: Lethargic, right facial droop,  EXT: No edema or tenderness GU: He has a condom catheter.  Urine in condom catheter and canister is very cloudy     Data Reviewed:   I have personally reviewed following labs and imaging studies:  Labs: Labs show the following:   Basic Metabolic Panel: Recent Labs  Lab 04/05/2020 0044 04/02/2020 0044 04/01/20 0413 04/01/20 0413 04/02/20 0457 04/02/20 0457 04/03/20 0426 04/04/20 0427  NA 140  --  144  --  145  --  149* 149*  K 4.6   < > 4.6   < > 4.8   < > 4.1 4.4  CL 109  --  114*  --  117*  --  122* 119*  CO2 22  --  20*  --  19*  --  19* 22  GLUCOSE 158*  --  261*  --  220*  --  129* 214*  BUN 50*  --  70*  --  89*  --  94* 98*  CREATININE 2.24*  --  2.81*  --  3.10*  --  2.79* 2.81*  CALCIUM 8.4*  --  8.6*  --  8.6*  --  8.5* 8.5*  MG  --   --  1.7  --   --   --   --   --   PHOS  --   --  3.6  --   --   --   --   --    < > = values in this interval not displayed.   GFR Estimated Creatinine Clearance: 20.7 mL/min (A) (by C-G formula based on SCr of 2.81 mg/dL (H)). Liver Function Tests: Recent Labs  Lab 04/06/2020 0044  AST 22  ALT 17   ALKPHOS 95  BILITOT 0.6  PROT 7.0  ALBUMIN 2.9*   No results for input(s): LIPASE, AMYLASE in the last 168 hours. No results for input(s): AMMONIA in the last 168 hours. Coagulation profile Recent Labs  Lab 04/13/2020  0142 03/30/20 0501 03/31/20 0547 04/01/20 0413 04/02/20 0457  INR 2.2* 2.2* 2.7* 2.8* 1.9*    CBC: Recent Labs  Lab 03/17/2020 0044 04/01/20 0413 04/03/20 0426 04/04/20 0427  WBC 14.3* 13.5* 13.1* 15.7*  NEUTROABS 9.0*  --  10.3* 12.3*  HGB 13.5 13.8 12.6* 12.2*  HCT 41.2 41.4 38.8* 37.2*  MCV 98.3 96.5 98.0 97.4  PLT 221 234 220 221   Cardiac Enzymes: No results for input(s): CKTOTAL, CKMB, CKMBINDEX, TROPONINI in the last 168 hours. BNP (last 3 results) No results for input(s): PROBNP in the last 8760 hours. CBG: Recent Labs  Lab 04/03/20 2050 04/03/20 2349 04/04/20 0442 04/04/20 0805 04/04/20 1143  GLUCAP 165* 179* 204* 162* 206*   D-Dimer: No results for input(s): DDIMER in the last 72 hours. Hgb A1c: No results for input(s): HGBA1C in the last 72 hours. Lipid Profile: No results for input(s): CHOL, HDL, LDLCALC, TRIG, CHOLHDL, LDLDIRECT in the last 72 hours. Thyroid function studies: No results for input(s): TSH, T4TOTAL, T3FREE, THYROIDAB in the last 72 hours.  Invalid input(s): FREET3 Anemia work up: No results for input(s): VITAMINB12, FOLATE, FERRITIN, TIBC, IRON, RETICCTPCT in the last 72 hours. Sepsis Labs: Recent Labs  Lab 03/28/2020 0044 04/01/20 0413 04/03/20 0426 04/04/20 0427  WBC 14.3* 13.5* 13.1* 15.7*    Microbiology Recent Results (from the past 240 hour(s))  Respiratory Panel by RT PCR (Flu A&B, Covid) - Nasopharyngeal Swab     Status: None   Collection Time: 03/16/2020 12:52 AM   Specimen: Nasopharyngeal Swab  Result Value Ref Range Status   SARS Coronavirus 2 by RT PCR NEGATIVE NEGATIVE Final    Comment: (NOTE) SARS-CoV-2 target nucleic acids are NOT DETECTED. The SARS-CoV-2 RNA is generally detectable in  upper respiratoy specimens during the acute phase of infection. The lowest concentration of SARS-CoV-2 viral copies this assay can detect is 131 copies/mL. A negative result does not preclude SARS-Cov-2 infection and should not be used as the sole basis for treatment or other patient management decisions. A negative result may occur with  improper specimen collection/handling, submission of specimen other than nasopharyngeal swab, presence of viral mutation(s) within the areas targeted by this assay, and inadequate number of viral copies (<131 copies/mL). A negative result must be combined with clinical observations, patient history, and epidemiological information. The expected result is Negative. Fact Sheet for Patients:  PinkCheek.be Fact Sheet for Healthcare Providers:  GravelBags.it This test is not yet ap proved or cleared by the [place] FDA and  has been authorized for detection and/or diagnosis of SARS-CoV-2 by FDA under an Emergency Use Authorization (EUA). This EUA will remain  in effect (meaning this test can be used) for the duration of the COVID-19 declaration under Section 564(b)(1) of the Act, 21 U.S.C. section 360bbb-3(b)(1), unless the authorization is terminated or revoked sooner.    Influenza A by PCR NEGATIVE NEGATIVE Final   Influenza B by PCR NEGATIVE NEGATIVE Final    Comment: (NOTE) The Xpert Xpress SARS-CoV-2/FLU/RSV assay is intended as an aid in  the diagnosis of influenza from Nasopharyngeal swab specimens and  should not be used as a sole basis for treatment. Nasal washings and  aspirates are unacceptable for Xpert Xpress SARS-CoV-2/FLU/RSV  testing. Fact Sheet for Patients: PinkCheek.be Fact Sheet for Healthcare Providers: GravelBags.it This test is not yet approved or cleared by the [place] FDA and  has been authorized for  detection and/or diagnosis of SARS-CoV-2 by  FDA under an Emergency Use Authorization (  EUA). This EUA will remain  in effect (meaning this test can be used) for the duration of the  Covid-19 declaration under Section 564(b)(1) of the Act, 21  U.S.C. section 360bbb-3(b)(1), unless the authorization is  terminated or revoked. Performed at [facility], [place], [place] [address]   C Difficile Quick Screen w PCR reflex     Status: None   Collection Time: 03/31/20 10:55 AM   Specimen: STOOL  Result Value Ref Range Status   C Diff antigen NEGATIVE NEGATIVE Final   C Diff toxin NEGATIVE NEGATIVE Final   C Diff interpretation No C. difficile detected.  Final    Comment: Performed at [facility], [address] [place], [place] [address]    Procedures and diagnostic studies:  DG Loyce Dys Tube Plc W/Fl W/Rad  Result Date: 04/03/2020 CLINICAL DATA:  NG tube placement. Dysphagia. Nursing staff was unsuccessful at placement of a nasogastric 2 on the floor. EXAM: NASO G TUBE PLACEMENT WITH FL AND WITH RAD FLUOROSCOPY TIME:  Fluoroscopy Time:  0.3 minute Radiation Exposure Index (if provided by the fluoroscopic device): 1.9 mGy Number of Acquired Spot Images: 0 COMPARISON:  None. FINDINGS: A Dobbhoff tube was inserted through the right nare and advanced into the stomach. Real-time fluoroscopy was utilized for confirmation of the Dobbhoff tube placement with the tip in the fundus of the stomach. No complications. IMPRESSION: Successful placement of a Dobbhoff tube with the tip in the stomach. Electronically Signed   By: Kathreen Devoid   On: 04/03/2020 09:06    Medications:   .  stroke: mapping our early stages of recovery book   Does not apply Once  . aspirin  81 mg Oral Daily  . atorvastatin  40 mg Oral Daily  . cefTRIAXone (ROCEPHIN) IM  1 g Intramuscular Q24H  . cholecalciferol  2,000 Units Oral Daily  . febuxostat  40 mg Oral QODAY  . feeding supplement  (PRO-STAT SUGAR FREE 64)  30 mL Per Tube BID  . free water  150 mL Per Tube Q4H  . insulin aspart  0-9 Units Subcutaneous Q4H  . metoprolol tartrate  12.5 mg Per Tube BID   Continuous Infusions: . dextrose 75 mL/hr at 04/04/20 0817  . feeding supplement (JEVITY 1.5 CAL/FIBER) 1,000 mL (04/04/20 0810)  . heparin 1,700 Units/hr (04/04/20 0600)     LOS: 5 days   Joseluis Alessio  [facility]     04/04/2020, 12:44 PM            Progress Note    Richard [name]  [id number] DOB: [birth year]  DOA: 03/16/2020 PCP: [name], MD      Brief Narrative:    Medical records reviewed and are as summarized below:  Richard [name] is an 86 y.o. male  with medical history significant for paroxysmal A. 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       Assessment/Plan:   Principal Problem:   Acute CVA (cerebrovascular accident) ([place]) Active Problems:   PAF (paroxysmal atrial fibrillation) (H[facility])   HTN (hypertension)   CKD (chronic kidney disease) stage 4, GFR 15-29 ml/min (HCC)   Hemiparesis affecting right side as  late effect of cerebrovascular accident (CVA) ([place])   Type 2 diabetes mellitus with diabetic peripheral angiopathy without gangrene ([place])   Neurogenic bladder   Chronic anticoagulation   Pressure injury of buttock, stage 1, left   Malnutrition of moderate degree    Acute CVA (cerebrovascular accident) ([place]) with right hemiparesis, Lethargy -MRI brain showed small acute infarct in the left basal ganglia and corona radiata.  Repeat CT head did not show any evidence of intracranial bleed. -Patient with a history of CVA  with residual right upper extremity weakness and right facial droop -Continue Coumadin and statins -2D echo showed EF estimated at 60 to 65%, moderate LVH, grade 1 diastolic dysfunction -Follow-up with neurologist. -PT and OT recommend discharge to SNF Repeat CT head today because of increasing lethargy  Dysphagia/moderate protein calorie malnutrition -Patient failed swallow evaluation.  Keep NPO.  NG tube was replaced on 04/03/2020.  Continue enteral nutrition.   AKI on CKD stage 4 Creatinine is about the same but BUN is progressively getting worse.  Continue IV fluids.  Consulted nephrologist for further evaluation.  Lisinopril has been held.    Acute UTI  Continue IV Rocephin.  Follow-up urine culture.  Diarrhea C. difficile toxin negative.  Lomotil as needed for diarrhea.    Paroxysmal atrial fibrillation/ chronic anticoagulation/ history of PE/ history of factor V Leiden deficiency -IV heparin infusion has been substituted for Lovenox because of worsening kidney function.    HTN (hypertension) -Continue metoprolol       Type 2 diabetes mellitus with diabetic peripheral angiopathy without gangrene (HCC) -Sliding scale insulin coverage    Neurogenic bladder -Can continue intermittent self-catheterization as needed.   Gout On Febuxostat  Stage I left buttock decubitus ulcer Continue local wound care  Right forearm swelling from IV infiltration Resolved.   Body mass index is 25.24 kg/m.    Pressure Injury 03/30/20 Buttocks Left Stage 1 -  Intact skin with non-blanchable redness of a localized area usually over a bony prominence. Moisture associated damage (Active)  03/30/20 0036  Location: Buttocks  Location Orientation: Left  Staging: Stage 1 -  Intact skin with non-blanchable redness of a localized area usually over a bony prominence.  Wound Description (Comments): Moisture associated damage  Present on Admission: Yes          Family  Communication/Anticipated D/C date and plan/Code Status   DVT prophylaxis: Warfarin Code Status: DNR Family Communication: Plan discussed with his son, Mr. Boris Engelmann Disposition Plan:    Status is: Inpatient  Remains inpatient appropriate because:Unsafe d/c plan   Dispo: The patient is from: Home              Anticipated d/c is to: SNF              Anticipated d/c date is: > 3 days              Patient currently is not medically stable to d/c.            Subjective:   Patient is unable to provide any history.  Nurse reports that he is more lethargic.   Objective:    Vitals:   04/03/20 2345 04/04/20 0600 04/04/20 0810 04/04/20 1234  BP: (!) 157/98  (!) 153/85 (!) 146/55  Pulse: 88  77 79  Resp: 18  19 20   Temp: 98.2 F (36.8 C)  98.6 F (37 C) 98.6 F (37 C)  TempSrc: Oral  Oral Axillary  SpO2: 94%  97% 97%  Weight:  84.4 kg    Height:        Intake/Output Summary (Last 24 hours) at 04/04/2020 1244 Last data filed at 04/04/2020 0600 Gross per 24 hour  Intake 2726.93 ml  Output 900 ml  Net 1826.93 ml   Filed Weights   04/02/20 0500 04/03/20 0500 04/04/20 0600  Weight: 84.3 kg 80.4 kg 84.4 kg    Exam:  GEN: No acute distress  SKIN: No rash.  Stage I left buttock decubitus ulcer present on admission EYES: EOMI ENT: MMM CV: RRR PULM: No wheezing or rales heard ABD: soft, ND, NT, +BS CNS: Lethargic.  He talks in a whispered voice and speech is barely audible.  Right facial droop EXT: Bilateral leg edema, no tenderness     Data Reviewed:   I have personally reviewed following labs and imaging studies:  Labs: Labs show the following:   Basic Metabolic Panel: Recent Labs  Lab 04/14/2020 0044 04/11/2020 0044 04/01/20 0413 04/01/20 0413 04/02/20 0457 04/02/20 0457 04/03/20 0426 04/04/20 0427  NA 140  --  144  --  145  --  149* 149*  K 4.6   < > 4.6   < > 4.8   < > 4.1 4.4  CL 109  --  114*  --  117*  --  122* 119*  CO2 22  --  20*   --  19*  --  19* 22  GLUCOSE 158*  --  261*  --  220*  --  129* 214*  BUN 50*  --  70*  --  89*  --  94* 98*  CREATININE 2.24*  --  2.81*  --  3.10*  --  2.79* 2.81*  CALCIUM 8.4*  --  8.6*  --  8.6*  --  8.5* 8.5*  MG  --   --  1.7  --   --   --   --   --   PHOS  --   --  3.6  --   --   --   --   --    < > = values in this interval not displayed.   GFR Estimated Creatinine Clearance: 20.7 mL/min (A) (by C-G formula based on SCr of 2.81 mg/dL (H)). Liver Function Tests: Recent Labs  Lab 03/31/2020 0044  AST 22  ALT 17  ALKPHOS 95  BILITOT 0.6  PROT 7.0  ALBUMIN 2.9*   No results for input(s): LIPASE, AMYLASE in the last 168 hours. No results for input(s): AMMONIA in the last 168 hours. Coagulation profile Recent Labs  Lab 03/16/2020 0142 03/30/20 0501 03/31/20 0547 04/01/20 0413 04/02/20 0457  INR 2.2* 2.2* 2.7* 2.8* 1.9*    CBC: Recent Labs  Lab 04/11/2020 0044 04/01/20 0413 04/03/20 0426 04/04/20 0427  WBC 14.3* 13.5* 13.1* 15.7*  NEUTROABS 9.0*  --  10.3* 12.3*  HGB 13.5 13.8 12.6* 12.2*  HCT 41.2 41.4 38.8* 37.2*  MCV 98.3 96.5 98.0 97.4  PLT 221 234 220 221   Cardiac Enzymes: No results for input(s): CKTOTAL, CKMB, CKMBINDEX, TROPONINI in the last 168 hours. BNP (last 3 results) No results for input(s): PROBNP in the last 8760 hours. CBG: Recent Labs  Lab 04/03/20 2050 04/03/20 2349 04/04/20 0442 04/04/20 0805 04/04/20 1143  GLUCAP 165* 179* 204* 162* 206*   D-Dimer: No results for input(s): DDIMER in the last 72 hours. Hgb A1c: No results for input(s): HGBA1C in the last 72 hours. Lipid Profile: No results for input(s): CHOL, HDL,  LDLCALC, TRIG, CHOLHDL, LDLDIRECT in the last 72 hours. Thyroid function studies: No results for input(s): TSH, T4TOTAL, T3FREE, THYROIDAB in the last 72 hours.  Invalid input(s): FREET3 Anemia work up: No results for input(s): VITAMINB12, FOLATE, FERRITIN, TIBC, IRON, RETICCTPCT in the last 72 hours. Sepsis  Labs: Recent Labs  Lab 04/08/2020 0044 04/01/20 0413 04/03/20 0426 04/04/20 0427  WBC 14.3* 13.5* 13.1* 15.7*    Microbiology Recent Results (from the past 240 hour(s))  Respiratory Panel by RT PCR (Flu A&B, Covid) - Nasopharyngeal Swab     Status: None   Collection Time: 04/11/2020 12:52 AM   Specimen: Nasopharyngeal Swab  Result Value Ref Range Status   SARS Coronavirus 2 by RT PCR NEGATIVE NEGATIVE Final    Comment: (NOTE) SARS-CoV-2 target nucleic acids are NOT DETECTED. The SARS-CoV-2 RNA is generally detectable in upper respiratoy specimens during the acute phase of infection. The lowest concentration of SARS-CoV-2 viral copies this assay can detect is 131 copies/mL. A negative result does not preclude SARS-Cov-2 infection and should not be used as the sole basis for treatment or other patient management decisions. A negative result may occur with  improper specimen collection/handling, submission of specimen other than nasopharyngeal swab, presence of viral mutation(s) within the areas targeted by this assay, and inadequate number of viral copies (<131 copies/mL). A negative result must be combined with clinical observations, patient history, and epidemiological information. The expected result is Negative. Fact Sheet for Patients:  PinkCheek.be Fact Sheet for Healthcare Providers:  GravelBags.it This test is not yet ap proved or cleared by the [place] FDA and  has been authorized for detection and/or diagnosis of SARS-CoV-2 by FDA under an Emergency Use Authorization (EUA). This EUA will remain  in effect (meaning this test can be used) for the duration of the COVID-19 declaration under Section 564(b)(1) of the Act, 21 U.S.C. section 360bbb-3(b)(1), unless the authorization is terminated or revoked sooner.    Influenza A by PCR NEGATIVE NEGATIVE Final   Influenza B by PCR NEGATIVE NEGATIVE Final     Comment: (NOTE) The Xpert Xpress SARS-CoV-2/FLU/RSV assay is intended as an aid in  the diagnosis of influenza from Nasopharyngeal swab specimens and  should not be used as a sole basis for treatment. Nasal washings and  aspirates are unacceptable for Xpert Xpress SARS-CoV-2/FLU/RSV  testing. Fact Sheet for Patients: PinkCheek.be Fact Sheet for Healthcare Providers: GravelBags.it This test is not yet approved or cleared by the [place] FDA and  has been authorized for detection and/or diagnosis of SARS-CoV-2 by  FDA under an Emergency Use Authorization (EUA). This EUA will remain  in effect (meaning this test can be used) for the duration of the  Covid-19 declaration under Section 564(b)(1) of the Act, 21  U.S.C. section 360bbb-3(b)(1), unless the authorization is  terminated or revoked. Performed at [facility], [place], [place] [address]   C Difficile Quick Screen w PCR reflex     Status: None   Collection Time: 03/31/20 10:55 AM   Specimen: STOOL  Result Value Ref Range Status   C Diff antigen NEGATIVE NEGATIVE Final   C Diff toxin NEGATIVE NEGATIVE Final   C Diff interpretation No C. difficile detected.  Final    Comment: Performed at [facility], [address] [place], [place] [address]    Procedures and diagnostic studies:  DG Loyce Dys Tube Plc W/Fl W/Rad  Result Date: 04/03/2020 CLINICAL DATA:  NG tube placement. Dysphagia. Nursing staff  was unsuccessful at placement of a nasogastric 2 on the floor. EXAM: NASO G TUBE PLACEMENT WITH FL AND WITH RAD FLUOROSCOPY TIME:  Fluoroscopy Time:  0.3 minute Radiation Exposure Index (if provided by the fluoroscopic device): 1.9 mGy Number of Acquired Spot Images: 0 COMPARISON:  None. FINDINGS: A Dobbhoff tube was inserted through the right nare and advanced into the stomach. Real-time fluoroscopy was utilized for confirmation of the  Dobbhoff tube placement with the tip in the fundus of the stomach. No complications. IMPRESSION: Successful placement of a Dobbhoff tube with the tip in the stomach. Electronically Signed   By: Kathreen Devoid   On: 04/03/2020 09:06    Medications:   .  stroke: mapping our early stages of recovery book   Does not apply Once  . aspirin  81 mg Oral Daily  . atorvastatin  40 mg Oral Daily  . cefTRIAXone (ROCEPHIN) IM  1 g Intramuscular Q24H  . cholecalciferol  2,000 Units Oral Daily  . febuxostat  40 mg Oral QODAY  . feeding supplement (PRO-STAT SUGAR FREE 64)  30 mL Per Tube BID  . free water  150 mL Per Tube Q4H  . insulin aspart  0-9 Units Subcutaneous Q4H  . metoprolol tartrate  12.5 mg Per Tube BID   Continuous Infusions: . dextrose 75 mL/hr at 04/04/20 0817  . feeding supplement (JEVITY 1.5 CAL/FIBER) 1,000 mL (04/04/20 0810)  . heparin 1,700 Units/hr (04/04/20 0600)     LOS: 5 days   Bassy Fetterly  [facility]     04/04/2020, 12:44 PM

## 2020-04-04 NOTE — Progress Notes (Signed)
ANTICOAGULATION CONSULT NOTE - Follow Up Consult  Pharmacy Consult for Warfarin --> heparin Indication: atrial fibrillation and VTE treatment  No Known Allergies  Patient Measurements: Height: 6' (182.9 cm) Weight: 80.4 kg (177 lb 4 oz) IBW/kg (Calculated) : 77.6  Vital Signs: Temp: 98.2 F (36.8 C) (04/19 2345) Temp Source: Oral (04/19 2345) BP: 157/98 (04/19 2345) Pulse Rate: 88 (04/19 2345)  Labs: Recent Labs    04/02/20 0457 04/02/20 0917 04/02/20 2311 04/03/20 0426 04/03/20 0850 04/03/20 1812 04/04/20 0427  HGB  --   --   --  12.6*  --   --  12.2*  HCT  --   --   --  38.8*  --   --  37.2*  PLT  --   --   --  220  --   --  221  LABPROT 22.0*  --   --   --   --   --   --   INR 1.9*  --   --   --   --   --   --   HEPARINUNFRC  --    < >   < >  --  0.25* 0.29* 0.49  CREATININE 3.10*  --   --  2.79*  --   --  2.81*   < > = values in this interval not displayed.    Estimated Creatinine Clearance: 20.7 mL/min (A) (by C-G formula based on SCr of 2.81 mg/dL (H)).  Assessment: Patient is a 86yo male admitted for CVA. Patient has a h/o Factor V Leiden deficiency, PE, afib. Patient was taking Warfarin prior to admission. Pharmacy is consulted to manage Warfarin therapy.  Patient unable to swallow, cannot crush warfarin. Will transition to IV anticoagulation when INR < 2.0  Home Dose: Warfarin 5mg  daily, last dose PTA 4/13 per med rec  Date      INR     Dose 4/14        2.2       Not given (dysphagia, NPO) 4/15        2.2    None given (order expired) 4/16     2.7    None Given 4/17    2.8    None given 4/18    1.9  NR < 2 Discussed with hospitalitist, started on heparin at this time due to worsening renal function.  Started heparin at 1,100 units per hour with a 4,000 unit bolus.  4/18 1457 HL 0.44 - 1100 units/hr continued 4/18 2311 HL 0.25 - increased to 1300 units/hr 4/19 0850 HL 0.25 will increase to 1500 units/hr 4/19 1812 HL 0.29  Goal of Therapy:   Heparin level: 0.3-0.7   Plan:  04/20 @ 0430 HL 0.49 therapeutic. Will continue current rate and will recheck at 1300 and continue to monitor.  Tobie Lords, PharmD, BCPS Clinical Pharmacist 04/04/2020 5:49 AM

## 2020-04-04 NOTE — Progress Notes (Signed)
Palliative-  Consult received, chart reviewed. Attempted to see patient but he was not in his room. Will try again at a later time.   Mariana Kaufman, AGNP-C Palliative Medicine  Please call Palliative Medicine team phone with any questions [phone]. For individual providers please see AMION.  No charge

## 2020-04-04 NOTE — Progress Notes (Signed)
ANTICOAGULATION CONSULT NOTE - Follow Up Consult  Pharmacy Consult for Warfarin --> heparin Indication: atrial fibrillation and VTE treatment  No Known Allergies  Patient Measurements: Height: 6' (182.9 cm) Weight: 84.4 kg (186 lb 1.1 oz) IBW/kg (Calculated) : 77.6  Vital Signs: Temp: 98.6 F (37 C) (04/20 1234) Temp Source: Axillary (04/20 1234) BP: 146/55 (04/20 1234) Pulse Rate: 79 (04/20 1234)  Labs: Recent Labs    04/02/20 0457 04/02/20 0917 04/03/20 0426 04/03/20 0850 04/03/20 1812 04/04/20 0427 04/04/20 1303  HGB  --   --  12.6*  --   --  12.2*  --   HCT  --   --  38.8*  --   --  37.2*  --   PLT  --   --  220  --   --  221  --   LABPROT 22.0*  --   --   --   --   --   --   INR 1.9*  --   --   --   --   --   --   HEPARINUNFRC  --    < >  --    < > 0.29* 0.49 0.52  CREATININE 3.10*  --  2.79*  --   --  2.81*  --    < > = values in this interval not displayed.    Estimated Creatinine Clearance: 20.7 mL/min (A) (by C-G formula based on SCr of 2.81 mg/dL (H)).  Assessment: Patient is a 86yo male admitted for CVA. Patient has a h/o Factor V Leiden deficiency, PE, afib. Patient was taking Warfarin prior to admission. Pharmacy is consulted to manage Warfarin therapy.  Patient unable to swallow, cannot crush warfarin. Will transition to IV anticoagulation when INR < 2.0  Home Dose: Warfarin 5mg  daily, last dose PTA 4/13 per med rec  Date      INR     Dose 4/14        2.2       Not given (dysphagia, NPO) 4/15        2.2    None given (order expired) 4/16     2.7    None Given 4/17    2.8    None given 4/18    1.9  NR < 2 Discussed with hospitalitist, started on heparin at this time due to worsening renal function.  Started heparin at 1,100 units per hour with a 4,000 unit bolus.  4/18 1457 HL 0.44 - 1100 units/hr continued 4/18 2311 HL 0.25 - increased to 1300 units/hr 4/19 0850 HL 0.25 will increase to 1500 units/hr 4/19 1812 HL 0.29 4/20 0430 HL 0.49  therapeutic x 1 4/20 1303 HL 0.52 therapeutic x 2  Goal of Therapy:  Heparin level: 0.3-0.7   Plan:  2 consecutive therapeutic heparin levels, will continue current rate of 1700 units/hr and will recheck HL/CBC with am labs and continue to monitor daily per consult.  Lu Duffel, PharmD, BCPS Clinical Pharmacist 04/04/2020 1:50 PM

## 2020-04-04 NOTE — Progress Notes (Signed)
Dr. Mal Misty notified that when pt working with PT, reported back to RN that pt's right sided seemed to be weaker than a few days ago. PT reports that this could be to increased overall lethargy. RR has also increased but pt sating in high 90's on RA. WCTM.

## 2020-04-04 NOTE — Progress Notes (Signed)
Inpatient Diabetes Program Recommendations  AACE/ADA: New Consensus Statement on Inpatient Glycemic Control (2015)  Target Ranges:  Prepandial:   less than 140 mg/dL      Peak postprandial:   less than 180 mg/dL (1-2 hours)      Critically ill patients:  140 - 180 mg/dL   Lab Results  Component Value Date   GLUCAP 206 (H) 04/04/2020   HGBA1C 6.7 (H) 04/10/2020    Review of Glycemic Control Results for Richard [name], Richard [name] (MRN [id number]) as of 04/04/2020 13:24  Ref. Range 04/03/2020 20:50 04/03/2020 23:49 04/04/2020 04:42 04/04/2020 08:05 04/04/2020 11:43  Glucose-Capillary Latest Ref Range: 70 - 99 mg/dL 165 (H) 179 (H) 204 (H) 162 (H) 206 (H)   Diabetes history: DM 2 Outpatient Diabetes medications: Amaryl 4 mg Daily Current orders for Inpatient glycemic control:  Novolog 0-9 units Q4 hours  A1c 6.7% on 4/14 BUN/Creat: 98/2.81  Inpatient Diabetes Program Recommendations:    May consider Novolog 2 units Q4 hours Tube Feed Coverage while pt is on Tube Feeds (Do not give if tube feeds are stopped or held).   Thanks,  Tama Headings RN, MSN, BC-ADM Inpatient Diabetes Coordinator Team Pager [phone] (8a-5p)

## 2020-04-04 NOTE — Progress Notes (Signed)
Progress Note    Richard [name]  JGG:[id number] DOB: 1931/02/25  DOA: 03/24/2020 PCP: [name], MD      Brief Narrative:    Medical records reviewed and are as summarized below:  Richard [name] is an 86 y.o. male with medical history significant forparoxysmal A. fib,CKD 4,factor V Leiden deficiency with history of BKA on chronic Coumadin, diabetes and hypertension, and history of CVA with residual right upper extremity weakness as well as history of neurogenic bladder who self catheterizes was brought into the emergency room by EMS with sudden onset severe right-sided weakness, right-sided facial droop with drooling and slurred speech.   He was found to have acute stroke (small acute infarct in the left basal ganglia and corona radiata on MRI brain).  He was evaluated by speech therapist.  Unfortunately he has severe dysphagia and speech therapist recommended strict n.p.o.  NG tube was placed for enteral nutrition and medications.  He has CKD stage IV but he developed acute kidney injury and he was treated with IV fluids.  Nephrologist was consulted for this as well.  He was also given IV fluids for hypernatremia.  He has neurogenic bladder and does intermittent self-catheterization at home.  Urinalysis was abnormal on presentation but he was not started on antibiotics at that time.  Patient developed cloudy and very foul-smelling urine and he was becoming more more lethargic.  Urinalysis was repeated and was still abnormal so he was started on IV antibiotics.  Because of high risk for thromboembolic disease, patient was treated with IV heparin infusion since Coumadin could not be given via NG tube.       Assessment/Plan:   Principal Problem:   Acute CVA (cerebrovascular accident) ([place]) Active Problems:   PAF (paroxysmal atrial fibrillation) (HCC)   HTN (hypertension)   CKD (chronic kidney disease) stage 4, GFR 15-29 ml/min (HCC)   Hemiparesis affecting right  side as late effect of cerebrovascular accident (CVA) ([place])   Type 2 diabetes mellitus with diabetic peripheral angiopathy without gangrene (HCC)   Neurogenic bladder   Chronic anticoagulation   Pressure injury of buttock, stage 1, left   Malnutrition of moderate degree    Acute CVA (cerebrovascular accident) ([place]) with right hemiparesis, lethargy -MRI brain showed small acute infarct in the left basal ganglia and corona radiata. Repeat CT head 03/30/2020 did not show any evidence of intracranial bleed. CT head was repeated today (04/04/2020) because of increasing lethargy but there were no new abnormalities. -2D echo showed EF estimated at 60 to 65%, moderate LVH, grade 1 diastolic dysfunction -Follow-up with neurologist as outpatient. -PT and OT recommend discharge to SNF   Dysphagia/moderate protein calorie malnutrition -Patient failed swallow evaluation. Keep NPO. NG tube was replaced by radiologist on 04/03/2020. Continue enteral nutrition.  Follow-up with speech therapist tomorrow for his repeat swallow evaluation if patient is more alert. Follow-up with dietitian. Monitor electrolytes.   Diarrhea C. difficile toxin negative. Lomotil as needed for diarrhea.  PAF (paroxysmal atrial fibrillation/ Chronic anticoagulation/ history of PE history of factor V Leiden deficiency Continue IV heparin infusion and monitor PTT per protocol.   Acute UTI, lethargy Continue IV Rocephin.  Follow-up urine cultures.  HTN (hypertension) Continue metoprolol  AKI on CKDCKD stage 4  Creatinine is slowly improving. Continue IV fluids and monitor BMP.  Consult the nephrologist for further evaluation.  Hypernatremia: Treat with IV fluids.  Type 2 diabetes mellitus with hyperglycemia -Treat with Lantus and sliding scale insulin coverage  Neurogenic bladder -Can continue intermittent self-catheterization as needed.     Body mass index is 25.24 kg/m.   Family  Communication/Anticipated D/C date and plan/Code Status   DVT prophylaxis: IV heparin Code Status: DNR Family Communication: Plan discussed with his son, [name] Disposition Plan:    Status is: Inpatient  Remains inpatient appropriate because:IV treatments appropriate due to intensity of illness or inability to take PO and Inpatient level of care appropriate due to severity of illness   Dispo: The patient is from: Home              Anticipated d/c is to: SNF              Anticipated d/c date is: 3 days              Patient currently is not medically stable to d/c.            Subjective:   Patient is unable to provide any history.  His nurse reports that he has been more lethargic.  Objective:    Vitals:   04/04/20 0810 04/04/20 1234 04/04/20 1420 04/04/20 1705  BP: (!) 153/85 (!) 146/55 (!) 143/68   Pulse: 77 79 69   Resp: 19 20 (!) 22 (!) 24  Temp: 98.6 F (37 C) 98.6 F (37 C) 98.4 F (36.9 C)   TempSrc: Oral Axillary Oral   SpO2: 97% 97% 98%   Weight:      Height:       No data found.   Intake/Output Summary (Last 24 hours) at 04/04/2020 1745 Last data filed at 04/04/2020 0810 Gross per 24 hour  Intake 2790.93 ml  Output 1200 ml  Net 1590.93 ml   Filed Weights   04/02/20 0500 04/03/20 0500 04/04/20 0600  Weight: 84.3 kg 80.4 kg 84.4 kg    Exam:  GEN: NAD SKIN: No rash EYES: EOMI ENT: MMM CV: RRR PULM: CTA B ABD: soft, ND, NT, +BS CNS: Lethargic, he does not follow commands EXT: Bilateral leg edema, no tenderness   Data Reviewed:   I have personally reviewed following labs and imaging studies:  Labs: Labs show the following:   Basic Metabolic Panel: Recent Labs  Lab 03/16/2020 0044 03/28/2020 0044 04/01/20 0413 04/01/20 0413 04/02/20 0457 04/02/20 0457 04/03/20 0426 04/04/20 0427  NA 140  --  144  --  145  --  149* 149*  K 4.6   < > 4.6   < > 4.8   < > 4.1 4.4  CL 109  --  114*  --  117*  --  122* 119*  CO2 22  --  20*  --   19*  --  19* 22  GLUCOSE 158*  --  261*  --  220*  --  129* 214*  BUN 50*  --  70*  --  89*  --  94* 98*  CREATININE 2.24*  --  2.81*  --  3.10*  --  2.79* 2.81*  CALCIUM 8.4*  --  8.6*  --  8.6*  --  8.5* 8.5*  MG  --   --  1.7  --   --   --   --   --   PHOS  --   --  3.6  --   --   --   --   --    < > = values in this interval not displayed.   GFR Estimated Creatinine Clearance: 20.7 mL/min (A) (by C-G formula based  on SCr of 2.81 mg/dL (H)). Liver Function Tests: Recent Labs  Lab 04/04/2020 0044  AST 22  ALT 17  ALKPHOS 95  BILITOT 0.6  PROT 7.0  ALBUMIN 2.9*   No results for input(s): LIPASE, AMYLASE in the last 168 hours. No results for input(s): AMMONIA in the last 168 hours. Coagulation profile Recent Labs  Lab 03/17/2020 0142 03/30/20 0501 03/31/20 0547 04/01/20 0413 04/02/20 0457  INR 2.2* 2.2* 2.7* 2.8* 1.9*    CBC: Recent Labs  Lab 03/24/2020 0044 04/01/20 0413 04/03/20 0426 04/04/20 0427  WBC 14.3* 13.5* 13.1* 15.7*  NEUTROABS 9.0*  --  10.3* 12.3*  HGB 13.5 13.8 12.6* 12.2*  HCT 41.2 41.4 38.8* 37.2*  MCV 98.3 96.5 98.0 97.4  PLT 221 234 220 221   Cardiac Enzymes: No results for input(s): CKTOTAL, CKMB, CKMBINDEX, TROPONINI in the last 168 hours. BNP (last 3 results) No results for input(s): PROBNP in the last 8760 hours. CBG: Recent Labs  Lab 04/03/20 2349 04/04/20 0442 04/04/20 0805 04/04/20 1143 04/04/20 1655  GLUCAP 179* 204* 162* 206* 146*   D-Dimer: No results for input(s): DDIMER in the last 72 hours. Hgb A1c: No results for input(s): HGBA1C in the last 72 hours. Lipid Profile: No results for input(s): CHOL, HDL, LDLCALC, TRIG, CHOLHDL, LDLDIRECT in the last 72 hours. Thyroid function studies: No results for input(s): TSH, T4TOTAL, T3FREE, THYROIDAB in the last 72 hours.  Invalid input(s): FREET3 Anemia work up: No results for input(s): VITAMINB12, FOLATE, FERRITIN, TIBC, IRON, RETICCTPCT in the last 72 hours. Sepsis  Labs: Recent Labs  Lab 03/28/2020 0044 04/01/20 0413 04/03/20 0426 04/04/20 0427  WBC 14.3* 13.5* 13.1* 15.7*    Microbiology Recent Results (from the past 240 hour(s))  Respiratory Panel by RT PCR (Flu A&B, Covid) - Nasopharyngeal Swab     Status: None   Collection Time: 04/06/2020 12:52 AM   Specimen: Nasopharyngeal Swab  Result Value Ref Range Status   SARS Coronavirus 2 by RT PCR NEGATIVE NEGATIVE Final    Comment: (NOTE) SARS-CoV-2 target nucleic acids are NOT DETECTED. The SARS-CoV-2 RNA is generally detectable in upper respiratoy specimens during the acute phase of infection. The lowest concentration of SARS-CoV-2 viral copies this assay can detect is 131 copies/mL. A negative result does not preclude SARS-Cov-2 infection and should not be used as the sole basis for treatment or other patient management decisions. A negative result may occur with  improper specimen collection/handling, submission of specimen other than nasopharyngeal swab, presence of viral mutation(s) within the areas targeted by this assay, and inadequate number of viral copies (<131 copies/mL). A negative result must be combined with clinical observations, patient history, and epidemiological information. The expected result is Negative. Fact Sheet for Patients:  PinkCheek.be Fact Sheet for Healthcare Providers:  GravelBags.it This test is not yet ap proved or cleared by the [place] FDA and  has been authorized for detection and/or diagnosis of SARS-CoV-2 by FDA under an Emergency Use Authorization (EUA). This EUA will remain  in effect (meaning this test can be used) for the duration of the COVID-19 declaration under Section 564(b)(1) of the Act, 21 U.S.C. section 360bbb-3(b)(1), unless the authorization is terminated or revoked sooner.    Influenza A by PCR NEGATIVE NEGATIVE Final   Influenza B by PCR NEGATIVE NEGATIVE Final     Comment: (NOTE) The Xpert Xpress SARS-CoV-2/FLU/RSV assay is intended as an aid in  the diagnosis of influenza from Nasopharyngeal swab specimens and  should not be  used as a sole basis for treatment. Nasal washings and  aspirates are unacceptable for Xpert Xpress SARS-CoV-2/FLU/RSV  testing. Fact Sheet for Patients: PinkCheek.be Fact Sheet for Healthcare Providers: GravelBags.it This test is not yet approved or cleared by the [place] FDA and  has been authorized for detection and/or diagnosis of SARS-CoV-2 by  FDA under an Emergency Use Authorization (EUA). This EUA will remain  in effect (meaning this test can be used) for the duration of the  Covid-19 declaration under Section 564(b)(1) of the Act, 21  U.S.C. section 360bbb-3(b)(1), unless the authorization is  terminated or revoked. Performed at [facility], [place], [place] [address]   C Difficile Quick Screen w PCR reflex     Status: None   Collection Time: 03/31/20 10:55 AM   Specimen: STOOL  Result Value Ref Range Status   C Diff antigen NEGATIVE NEGATIVE Final   C Diff toxin NEGATIVE NEGATIVE Final   C Diff interpretation No C. difficile detected.  Final    Comment: Performed at [facility], [address] [place], [place] [address]    Procedures and diagnostic studies:  CT HEAD WO CONTRAST  Result Date: 04/04/2020 CLINICAL DATA:  Encephalopathy. EXAM: CT HEAD WITHOUT CONTRAST TECHNIQUE: Contiguous axial images were obtained from the base of the skull through the vertex without intravenous contrast. COMPARISON:  Head CT 12/30/2018, 11/12/2019, 03/30/2020 FINDINGS: Brain: There is no mass, hemorrhage or extra-axial collection. The size and configuration of the ventricles and extra-axial CSF spaces are normal. There is hypoattenuation of the white matter, most commonly indicating chronic small vessel disease. Expected  evolution of left basal ganglia subacute infarct. Chronic right convexity subdural hematoma has resolved. Vascular: No abnormal hyperdensity of the major intracranial arteries or dural venous sinuses. No intracranial atherosclerosis. Skull: The visualized skull base, calvarium and extracranial soft tissues are normal. Sinuses/Orbits: No fluid levels or advanced mucosal thickening of the visualized paranasal sinuses. No mastoid or middle ear effusion. The orbits are normal. IMPRESSION: 1. No acute intracranial abnormality. 2. Expected evolution of left basal ganglia subacute infarct. 3. Chronic small vessel disease. Electronically Signed   By: Ulyses Jarred M.D.   On: 04/04/2020 15:08   DG [place] Dys Tube Plc W/Fl W/Rad  Result Date: 04/03/2020 CLINICAL DATA:  NG tube placement. Dysphagia. Nursing staff was unsuccessful at placement of a nasogastric 2 on the floor. EXAM: NASO G TUBE PLACEMENT WITH FL AND WITH RAD FLUOROSCOPY TIME:  Fluoroscopy Time:  0.3 minute Radiation Exposure Index (if provided by the fluoroscopic device): 1.9 mGy Number of Acquired Spot Images: 0 COMPARISON:  None. FINDINGS: A Dobbhoff tube was inserted through the right nare and advanced into the stomach. Real-time fluoroscopy was utilized for confirmation of the Dobbhoff tube placement with the tip in the fundus of the stomach. No complications. IMPRESSION: Successful placement of a Dobbhoff tube with the tip in the stomach. Electronically Signed   By: Kathreen Devoid   On: 04/03/2020 09:06    Medications:   .  stroke: mapping our early stages of recovery book   Does not apply Once  . aspirin  81 mg Oral Daily  . atorvastatin  40 mg Oral Daily  . cefTRIAXone (ROCEPHIN) IM  1 g Intramuscular Q24H  . cholecalciferol  2,000 Units Oral Daily  . febuxostat  40 mg Oral QODAY  . feeding supplement (PRO-STAT SUGAR FREE 64)  30 mL Per Tube BID  . free water  150 mL Per Tube  Q4H  . insulin aspart  0-9 Units Subcutaneous Q4H  . metoprolol  tartrate  12.5 mg Per Tube BID   Continuous Infusions: . dextrose 75 mL/hr at 04/04/20 0817  . feeding supplement (JEVITY 1.5 CAL/FIBER) 1,000 mL (04/04/20 1529)  . heparin 1,700 Units/hr (04/04/20 1700)     LOS: 5 days   Danique Hartsough  [facility]     04/04/2020, 5:45 PM

## 2020-04-04 NOTE — TOC Progression Note (Signed)
Transition of Care [facility]) - Progression Note    Patient Details  Name: Richard [name] MRN: [id number] Date of Birth: 1931-06-29  Transition of [facility]) CM/SW [place], RN Phone Number: 04/04/2020, 10:18 AM  Clinical Narrative:    Patient is alert and oriented and speaks in a whisper, he still refuses SNF and wants to go home with his 24/7 caregiver Rodena Piety who lives in his home to care for him.  He is NPO and having tube feeding thru a NG tube, will continue to monitor for needs   Expected Discharge Plan: [place] Barriers to Discharge: Continued Medical Work up  Expected Discharge Plan and Services Expected Discharge Plan: [place]   Discharge Planning Services: CM Consult   Living arrangements for the past 2 months: Single Family Home                 DME Arranged: N/A         HH Arranged: RN, PT, OT, Nurse's Aide, Social Work [employer] Agency: [place] ([place]) Date [place]: 03/30/20 Time HH Agency Contacted: 1500 Representative spoke with at [place]: [place] ([place]) Interventions    Readmission Risk Interventions No flowsheet data found.

## 2020-04-04 NOTE — NC FL2 (Signed)
[place] LEVEL OF CARE SCREENING TOOL     IDENTIFICATION  Patient Name: Richard [name] Birthdate: Feb 17, 1931 Sex: male Admission Date (Current Location): 03/23/2020  [place] and [place] Number:  Engineering geologist and Address:  [facility], [address], [place], [place] [address]      Provider Number: [id number]  Attending Physician Name and Address:  Jennye Boroughs, MD  Relative Name and Phone Number:  Rodena Piety Caregiver POA [phone]    Current Level of Care: Hospital Recommended Level of Care: [place] Prior Approval Number:    Date Approved/Denied:   PASRR Number: [id number]  Discharge Plan: SNF    Current Diagnoses: Patient Active Problem List   Diagnosis Date Noted  . Malnutrition of moderate degree 03/31/2020  . Pressure injury of buttock, stage 1, left 03/30/2020  . CKD (chronic kidney disease) stage 4, GFR 15-29 ml/min (HCC) 04/06/2020  . Hemiparesis affecting right side as late effect of cerebrovascular accident (CVA) ([place]) 04/10/2020  . Type 2 diabetes mellitus with diabetic peripheral angiopathy without gangrene ([place]) 03/31/2020  . Neurogenic bladder 04/02/2020  . Chronic anticoagulation 03/28/2020  . Acute CVA (cerebrovascular accident) ([place]) 11/13/2019  . Acute ischemic stroke ([place])   . Urinary tract infection without hematuria   . AKI (acute kidney injury) ([place])   . Altered mental status 11/12/2019  . Subdural hematoma ([place]) 12/28/2018  . PAF (paroxysmal atrial fibrillation) ([place]) 12/28/2018  . Diabetes ([place]) 12/28/2018  . CKD (chronic kidney disease), stage III 12/28/2018  . HTN (hypertension) 12/28/2018  . Pressure injury of skin 11/30/2018  . Acute osteomyelitis of toe of right foot ([place]) 11/28/2018  . Malignant tumor of prostate ([place]) 10/26/2015  . Urinary tract infection with hematuria 01/11/2015  . Bladder neck obstruction 11/04/2012  . 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 Prostate  cancer ([place]) 11/04/2012  . Retention of urine 05/06/2012  . History of hematuria 01/30/2012  . Malignant neoplasm of prostate ([place]) 09/30/2011  . Gross hematuria 09/19/2011  . Benign prostatic hyperplasia with urinary retention 09/10/2011  . Elevated prostate specific antigen (PSA) 09/10/2011    Orientation RESPIRATION BLADDER Height & Weight     Self  Normal Incontinent Weight: 84.4 kg Height:  6' (182.9 cm)  BEHAVIORAL SYMPTOMS/MOOD NEUROLOGICAL BOWEL NUTRITION STATUS      Incontinent NG/panda(Tube feedings)  AMBULATORY STATUS COMMUNICATION OF NEEDS Skin   Extensive Assist Non-Verbally(sometimes will whisper most shakes his head)                         Personal Care Assistance Level of Assistance  Bathing, Feeding, Dressing Bathing Assistance: Maximum assistance Feeding assistance: Maximum assistance(Tube feedings thru NG tube) Dressing Assistance: Maximum assistance     Functional Limitations Info             SPECIAL CARE FACTORS FREQUENCY  PT (By licensed PT), OT (By licensed OT)     PT Frequency: 5 times per week OT Frequency: 5 times per week            Contractures Contractures Info: Not present    Additional Factors Info  Code Status Code Status Info: DNR             Current Medications (04/04/2020):  This is the current hospital active medication list Current Facility-Administered Medications  Medication Dose Route Frequency Provider Last Rate Last Admin  .  stroke: mapping our early stages of recovery book   Does not apply Once  Athena Masse, MD   Stopped at 03/31/2020 0930  . acetaminophen (TYLENOL) tablet 650 mg  650 mg Oral Q4H PRN Athena Masse, MD       Or  . acetaminophen (TYLENOL) 160 MG/5ML solution 650 mg  650 mg Per Tube Q4H PRN Athena Masse, MD       Or  . acetaminophen (TYLENOL) suppository 650 mg  650 mg Rectal Q4H PRN Athena Masse, MD   650 mg at 03/30/20 2042  . aspirin chewable tablet 81 mg  81 mg Oral Daily  Athena Masse, MD   81 mg at 04/04/20 1517  . atorvastatin (LIPITOR) tablet 40 mg  40 mg Oral Daily Athena Masse, MD   40 mg at 04/04/20 6160  . cefTRIAXone (ROCEPHIN) injection 1 g  1 g Intramuscular Q24H Jennye Boroughs, MD   1 g at 04/03/20 2054  . cholecalciferol (VITAMIN D3) tablet 2,000 Units  2,000 Units Oral Daily Jennye Boroughs, MD   2,000 Units at 04/04/20 [phone]  . dextrose 5 % solution   Intravenous Continuous Jennye Boroughs, MD 75 mL/hr at 04/04/20 0817 New Bag at 04/04/20 0817  . diphenoxylate-atropine (LOMOTIL) 2.5-0.025 MG/5ML liquid 5 mL  5 mL Per Tube QID PRN Jennye Boroughs, MD      . febuxostat (ULORIC) tablet 40 mg  40 mg Oral Tonye Pearson, MD   40 mg at 04/04/20 0809  . feeding supplement (JEVITY 1.5 CAL/FIBER) liquid 1,000 mL  1,000 mL Oral Continuous Jennye Boroughs, MD 35 mL/hr at 04/04/20 1529 1,000 mL at 04/04/20 1529  . feeding supplement (PRO-STAT SUGAR FREE 64) liquid 30 mL  30 mL Per Tube BID Jennye Boroughs, MD   30 mL at 04/04/20 0825  . free water 150 mL  150 mL Per Tube Q4H Jennye Boroughs, MD   150 mL at 04/04/20 1233  . heparin ADULT infusion 100 units/mL (25000 units/258mL sodium chloride 0.45%)  1,700 Units/hr Intravenous Continuous Jennye Boroughs, MD 17 mL/hr at 04/04/20 0600 1,700 Units/hr at 04/04/20 0600  . insulin aspart (novoLOG) injection 0-9 Units  0-9 Units Subcutaneous Q4H Lang Snow, NP   3 Units at 04/04/20 1232  . metoprolol tartrate (LOPRESSOR) tablet 12.5 mg  12.5 mg Per Tube BID Pernell Dupre, RPH   12.5 mg at 04/04/20 0810  . senna-docusate (Senokot-S) tablet 1 tablet  1 tablet Oral QHS PRN Athena Masse, MD         Discharge Medications: Please see discharge summary for a list of discharge medications.  Relevant Imaging Results:  Relevant Lab Results:   Additional Information [id number]  Su Hilt, RN

## 2020-04-04 NOTE — Progress Notes (Signed)
SLP Cancellation Note  Patient Details Name: Richard [name] MRN: [id number] DOB: 1931-05-22   Cancelled treatment:       Reason Eval/Treat Not Completed: (chart reviewed; consulted NSG/MD). Per discussion w/ MD and NSG, will hold on MBSS today d/t pt's lethargy and only recent replacement of NG for TFs support for nutrition. ST services will f/u tomorrow for ongoing assessment of appropriateness for MBSS. MD agreed. Recommend ongoing oral care for hygiene and stimulation of swallowing.     Orinda Kenner, MS, CCC-SLP Richard [name] 04/04/2020, 12:28 PM

## 2020-04-05 ENCOUNTER — Inpatient Hospital Stay: Payer: Medicare Other

## 2020-04-05 DIAGNOSIS — N184 Chronic kidney disease, stage 4 (severe): Secondary | ICD-10-CM

## 2020-04-05 DIAGNOSIS — N189 Chronic kidney disease, unspecified: Secondary | ICD-10-CM | POA: Diagnosis not present

## 2020-04-05 DIAGNOSIS — I10 Essential (primary) hypertension: Secondary | ICD-10-CM | POA: Diagnosis not present

## 2020-04-05 DIAGNOSIS — Z794 Long term (current) use of insulin: Secondary | ICD-10-CM

## 2020-04-05 DIAGNOSIS — I480 Paroxysmal atrial fibrillation: Secondary | ICD-10-CM

## 2020-04-05 DIAGNOSIS — E1151 Type 2 diabetes mellitus with diabetic peripheral angiopathy without gangrene: Secondary | ICD-10-CM

## 2020-04-05 LAB — CBC
HCT: 35.5 % — ABNORMAL LOW (ref 39.0–52.0)
Hemoglobin: 11.4 g/dL — ABNORMAL LOW (ref 13.0–17.0)
MCH: 31.6 pg (ref 26.0–34.0)
MCHC: 32.1 g/dL (ref 30.0–36.0)
MCV: 98.3 fL (ref 80.0–100.0)
Platelets: 189 10*3/uL (ref 150–400)
RBC: 3.61 MIL/uL — ABNORMAL LOW (ref 4.22–5.81)
RDW: 14 % (ref 11.5–15.5)
WBC: 17.1 10*3/uL — ABNORMAL HIGH (ref 4.0–10.5)
nRBC: 0 % (ref 0.0–0.2)

## 2020-04-05 LAB — BASIC METABOLIC PANEL
Anion gap: 8 (ref 5–15)
BUN: 107 mg/dL — ABNORMAL HIGH (ref 8–23)
CO2: 19 mmol/L — ABNORMAL LOW (ref 22–32)
Calcium: 7.8 mg/dL — ABNORMAL LOW (ref 8.9–10.3)
Chloride: 111 mmol/L (ref 98–111)
Creatinine, Ser: 2.64 mg/dL — ABNORMAL HIGH (ref 0.61–1.24)
GFR calc Af Amer: 24 mL/min — ABNORMAL LOW (ref >60)
GFR calc non Af Amer: 21 mL/min — ABNORMAL LOW (ref >60)
Glucose, Bld: 201 mg/dL — ABNORMAL HIGH (ref 70–99)
Potassium: 3.9 mmol/L (ref 3.5–5.1)
Sodium: 138 mmol/L (ref 135–145)

## 2020-04-05 LAB — CBC WITH DIFFERENTIAL/PLATELET
Abs Immature Granulocytes: 0.36 10*3/uL — ABNORMAL HIGH (ref 0.00–0.07)
Basophils Absolute: 0.2 10*3/uL — ABNORMAL HIGH (ref 0.0–0.1)
Basophils Relative: 1 %
Eosinophils Absolute: 0.1 10*3/uL (ref 0.0–0.5)
Eosinophils Relative: 1 %
HCT: 37.6 % — ABNORMAL LOW (ref 39.0–52.0)
Hemoglobin: 12 g/dL — ABNORMAL LOW (ref 13.0–17.0)
Immature Granulocytes: 2 %
Lymphocytes Relative: 7 %
Lymphs Abs: 1.3 10*3/uL (ref 0.7–4.0)
MCH: 31.7 pg (ref 26.0–34.0)
MCHC: 31.9 g/dL (ref 30.0–36.0)
MCV: 99.5 fL (ref 80.0–100.0)
Monocytes Absolute: 1.6 10*3/uL — ABNORMAL HIGH (ref 0.1–1.0)
Monocytes Relative: 8 %
Neutro Abs: 16.9 10*3/uL — ABNORMAL HIGH (ref 1.7–7.7)
Neutrophils Relative %: 81 %
Platelets: 187 10*3/uL (ref 150–400)
RBC: 3.78 MIL/uL — ABNORMAL LOW (ref 4.22–5.81)
RDW: 14.1 % (ref 11.5–15.5)
Smear Review: NORMAL
WBC: 20.5 10*3/uL — ABNORMAL HIGH (ref 4.0–10.5)
nRBC: 0 % (ref 0.0–0.2)

## 2020-04-05 LAB — GLUCOSE, CAPILLARY
Glucose-Capillary: 181 mg/dL — ABNORMAL HIGH (ref 70–99)
Glucose-Capillary: 184 mg/dL — ABNORMAL HIGH (ref 70–99)
Glucose-Capillary: 192 mg/dL — ABNORMAL HIGH (ref 70–99)
Glucose-Capillary: 213 mg/dL — ABNORMAL HIGH (ref 70–99)

## 2020-04-05 LAB — HEPARIN LEVEL (UNFRACTIONATED): Heparin Unfractionated: 0.34 IU/mL (ref 0.30–0.70)

## 2020-04-05 MED ORDER — HALOPERIDOL LACTATE 5 MG/ML IJ SOLN: 0.5000 mg | INTRAMUSCULAR | Status: DC | PRN

## 2020-04-05 MED ORDER — SODIUM CHLORIDE 0.9 % IV SOLN
12.5000 mg | Freq: Four times a day (QID) | INTRAVENOUS | Status: DC | PRN
  Filled 2020-04-05: qty 0.5

## 2020-04-05 MED ORDER — INSULIN GLARGINE 100 UNIT/ML ~~LOC~~ SOLN
10.0000 [IU] | Freq: Two times a day (BID) | SUBCUTANEOUS | Status: DC
  Administered 2020-04-05: 11:00:00 10 [IU] via SUBCUTANEOUS
  Filled 2020-04-05 (×2): qty 0.1

## 2020-04-05 MED ORDER — SODIUM CHLORIDE 0.9 % IV SOLN
12.5000 mg | Freq: Four times a day (QID) | INTRAVENOUS | Status: DC | PRN
  Administered 2020-04-05: 05:00:00 12.5 mg via INTRAVENOUS
  Filled 2020-04-05 (×2): qty 0.5

## 2020-04-05 MED ORDER — BIOTENE DRY MOUTH MT LIQD: 15.0000 mL | OROMUCOSAL | Status: DC | PRN

## 2020-04-05 MED ORDER — GLYCOPYRROLATE 1 MG PO TABS
1.0000 mg | ORAL_TABLET | ORAL | Status: DC | PRN
  Filled 2020-04-05: qty 1

## 2020-04-05 MED ORDER — INSULIN ASPART 100 UNIT/ML ~~LOC~~ SOLN: 0.0000 [IU] | Freq: Every day | SUBCUTANEOUS | Status: DC

## 2020-04-05 MED ORDER — ONDANSETRON 4 MG PO TBDP
4.0000 mg | ORAL_TABLET | Freq: Four times a day (QID) | ORAL | Status: DC | PRN
  Filled 2020-04-05: qty 1

## 2020-04-05 MED ORDER — GLYCOPYRROLATE 0.2 MG/ML IJ SOLN
0.2000 mg | INTRAMUSCULAR | Status: DC | PRN
  Administered 2020-04-06 (×2): 0.2 mg via INTRAVENOUS
  Filled 2020-04-05 (×5): qty 1

## 2020-04-05 MED ORDER — LORAZEPAM 1 MG PO TABS: 1.0000 mg | ORAL_TABLET | ORAL | Status: DC | PRN

## 2020-04-05 MED ORDER — INSULIN ASPART 100 UNIT/ML ~~LOC~~ SOLN
2.0000 [IU] | Freq: Three times a day (TID) | SUBCUTANEOUS | Status: DC
  Administered 2020-04-05: 12:00:00 2 [IU] via SUBCUTANEOUS
  Filled 2020-04-05: qty 1

## 2020-04-05 MED ORDER — HALOPERIDOL LACTATE 2 MG/ML PO CONC
0.5000 mg | ORAL | Status: DC | PRN
  Filled 2020-04-05: qty 0.3

## 2020-04-05 MED ORDER — ONDANSETRON HCL 4 MG/2ML IJ SOLN: 4.0000 mg | Freq: Four times a day (QID) | INTRAMUSCULAR | Status: DC | PRN

## 2020-04-05 MED ORDER — INSULIN GLARGINE 100 UNIT/ML ~~LOC~~ SOLN
13.0000 [IU] | Freq: Two times a day (BID) | SUBCUTANEOUS | Status: DC
  Filled 2020-04-05: qty 0.13

## 2020-04-05 MED ORDER — HALOPERIDOL 0.5 MG PO TABS
0.5000 mg | ORAL_TABLET | ORAL | Status: DC | PRN
  Filled 2020-04-05: qty 1

## 2020-04-05 MED ORDER — MORPHINE 100MG IN NS 100ML (1MG/ML) PREMIX INFUSION
2.0000 mg/h | INTRAVENOUS | Status: DC
  Administered 2020-04-05: 14:00:00 1 mg/h via INTRAVENOUS
  Filled 2020-04-05: qty 100

## 2020-04-05 MED ORDER — LORAZEPAM 2 MG/ML IJ SOLN: 1.0000 mg | INTRAMUSCULAR | Status: DC | PRN

## 2020-04-05 MED ORDER — INSULIN ASPART 100 UNIT/ML ~~LOC~~ SOLN
0.0000 [IU] | Freq: Three times a day (TID) | SUBCUTANEOUS | Status: DC
  Administered 2020-04-05: 2 [IU] via SUBCUTANEOUS
  Filled 2020-04-05: qty 1

## 2020-04-05 MED ORDER — ACETAMINOPHEN 650 MG RE SUPP: 650.0000 mg | Freq: Four times a day (QID) | RECTAL | Status: DC | PRN

## 2020-04-05 MED ORDER — LORAZEPAM 2 MG/ML PO CONC
1.0000 mg | ORAL | Status: DC | PRN
  Filled 2020-04-05: qty 0.5

## 2020-04-05 MED ORDER — GLYCOPYRROLATE 0.2 MG/ML IJ SOLN
0.2000 mg | INTRAMUSCULAR | Status: DC | PRN
  Filled 2020-04-05: qty 1

## 2020-04-05 MED ORDER — ACETAMINOPHEN 325 MG PO TABS: 650.0000 mg | ORAL_TABLET | Freq: Four times a day (QID) | ORAL | Status: DC | PRN

## 2020-04-05 MED ORDER — POLYVINYL ALCOHOL 1.4 % OP SOLN
1.0000 [drp] | Freq: Four times a day (QID) | OPHTHALMIC | Status: DC | PRN
  Filled 2020-04-05: qty 15

## 2020-04-05 MED ORDER — SODIUM CHLORIDE 0.9 % IV SOLN
1.0000 g | INTRAVENOUS | Status: DC
  Filled 2020-04-05: qty 10

## 2020-04-05 MED ORDER — MORPHINE BOLUS VIA INFUSION
2.0000 mg | Freq: Once | INTRAVENOUS | Status: AC
  Administered 2020-04-05: 2 mg via INTRAVENOUS
  Filled 2020-04-05: qty 2

## 2020-04-05 MED ORDER — FREE WATER: 200.0000 mL | Freq: Four times a day (QID) | Status: DC

## 2020-04-05 MED ORDER — SODIUM CHLORIDE 0.9 % IV SOLN: 12.5000 mg | Freq: Four times a day (QID) | INTRAVENOUS | Status: DC | PRN

## 2020-04-05 NOTE — Progress Notes (Signed)
ANTICOAGULATION CONSULT NOTE - Follow Up Consult  Pharmacy Consult for Warfarin --> heparin Indication: atrial fibrillation and VTE treatment  No Known Allergies  Patient Measurements: Height: 6' (182.9 cm) Weight: 86.1 kg (189 lb 13.1 oz) IBW/kg (Calculated) : 77.6  Vital Signs: Temp: 98.4 F (36.9 C) (04/21 0044) Temp Source: Oral (04/21 0044) BP: 154/70 (04/21 0044) Pulse Rate: 69 (04/21 0044)  Labs: Recent Labs    04/03/20 0426 04/03/20 0850 04/04/20 0427 04/04/20 1303 04/05/20 0537  HGB 12.6*  --  12.2*  --  11.4*  HCT 38.8*  --  37.2*  --  35.5*  PLT 220  --  221  --  189  HEPARINUNFRC  --    < > 0.49 0.52 0.34  CREATININE 2.79*  --  2.81*  --   --    < > = values in this interval not displayed.    Estimated Creatinine Clearance: 20.7 mL/min (A) (by C-G formula based on SCr of 2.81 mg/dL (H)).  Assessment: Patient is a 86yo male admitted for CVA. Patient has a h/o Factor V Leiden deficiency, PE, afib. Patient was taking Warfarin prior to admission. Pharmacy is consulted to manage Warfarin therapy.  Patient unable to swallow, cannot crush warfarin. Will transition to IV anticoagulation when INR < 2.0  Home Dose: Warfarin 5mg  daily, last dose PTA 4/13 per med rec  Date      INR     Dose 4/14        2.2       Not given (dysphagia, NPO) 4/15        2.2    None given (order expired) 4/16     2.7    None Given 4/17    2.8    None given 4/18    1.9  NR < 2 Discussed with hospitalitist, started on heparin at this time due to worsening renal function.  Started heparin at 1,100 units per hour with a 4,000 unit bolus.  4/18 1457 HL 0.44 - 1100 units/hr continued 4/18 2311 HL 0.25 - increased to 1300 units/hr 4/19 0850 HL 0.25 will increase to 1500 units/hr 4/19 1812 HL 0.29 4/20 0430 HL 0.49 therapeutic x 1 4/20 1303 HL 0.52 therapeutic x 2  Goal of Therapy:  Heparin level: 0.3-0.7   Plan:  04/21 @ 0640 HL 0.34 therapeutic. Will continue current rate and  will recheck HL w/ am labs, CBC trending down slightly will continue to monitor.  Tobie Lords, PharmD, BCPS Clinical Pharmacist 04/05/2020 7:12 AM

## 2020-04-05 NOTE — TOC Progression Note (Signed)
Transition of Care [facility]) - Progression Note    Patient Details  Name: Richard [name] MRN: [id number] Date of Birth: 04-Sep-1931  Transition of Care [facility]) CM/SW Contact  Su Hilt, RN Phone Number: 04/05/2020, 10:30 AM  Clinical Narrative:    Spoke to the son [name] and provided him with the bed offer from [facility], He agreed to accept the bed offer.  I notified Kelly at [facility].   Expected Discharge Plan: [place] Barriers to Discharge: Continued Medical Work up  Expected Discharge Plan and Services Expected Discharge Plan: [place]   Discharge Planning Services: CM Consult   Living arrangements for the past 2 months: Single Family Home                 DME Arranged: N/A         HH Arranged: RN, PT, OT, Nurse's Aide, Social Work [employer] Agency: [place] ([facility]) Date [place]: 03/30/20 Time [place]: 1500 Representative spoke with at [place]: [place] ([place]) Interventions    Readmission Risk Interventions No flowsheet data found.

## 2020-04-05 NOTE — Progress Notes (Signed)
OT Cancellation Note  Patient Details Name: Richard [name] MRN: [id number] DOB: Feb 11, 1931   Cancelled Treatment:    Reason Eval/Treat Not Completed: Medical issues which prohibited therapy  Pt moved to comfort measures at this time. Will complete OT order and sign off. Thank you for allowing [place] to participate in [name]'s care.   Gerrianne Scale, [place], OTR/L ascom [phone] 04/05/20, 3:40 PM

## 2020-04-05 NOTE — Progress Notes (Signed)
PT Cancellation Note  Patient Details Name: Richard [name] MRN: [id number] DOB: 1931/07/30   Cancelled Treatment:    Reason Eval/Treat Not Completed: Patient not medically ready;Fatigue/lethargy limiting ability to participate(Per chart review, treatment attempts, RN discussion- pt continues to decline. Pt now moved to comfort care. Will sign off. Please re-consult our services in future if appropriate.)  3:54 PM, 04/05/20 Etta Grandchild, PT, DPT Physical Therapist - [facility]  [phone] ([place])    [place] C 04/05/2020, 3:54 PM

## 2020-04-05 NOTE — Progress Notes (Signed)
[facility] PROGRESS NOTE    Progress Note  Richard [name]  NLZ:[id number] DOB: 1931-10-04 DOA: 03/23/2020 PCP: [name], MD     Brief Narrative:   Richard [name] is an 86 y.o. male past medical history significant for paroxysmal atrial fibrillation on chronic Coumadin, chronic kidney disease stage IV, factor V Leyden deficiency, with a history of BKA, uncontrolled diabetes mellitus, hypertension and CVA with neurogenic bladder who self catheterize comes into the ED with sudden onset of severe right-sided weakness and facial droop with slurred speech  Assessment/Plan:   Acute CVA (cerebrovascular accident) ([facility]) HgbA1c 6.7, fasting lipid panel HDL less than 40 LDL less than 100 MRI, MRA of the brain without contrast on 03/28/2020 showed small acute infarct at the left basal ganglia and corona radiata PT, OT recommended skilled nursing facility, Speech consult showed moderate risk for severe aspiration pneumonia status post aspiration pneumonia Carotid dopplers moderate ICA bilaterally. Transthoracic Echo preserved EF grade 1 diastolic heart failure. Family would like to move towards comfort care. Palliative care to assist as the patient has expressed in the past he would like to pass away at home, palliative Care to assist with transfer to patient to home with hospice.  Dysphagia/moderate protein caloric malnutrition: Going towards comfort care Hypovolemic hypernatremia: PAF (paroxysmal atrial fibrillation) (HCC) UTI: Worsening leukocytosis: Acute kidney injury on chronic kidney disease stage IV: Essential HTN (hypertension) Type 2 diabetes mellitus with diabetic peripheral angiopathy without gangrene (HCC) Neurogenic bladder; Goals of care/ethics: After long conversation with the family and explaining the risk and benefits of proceeding with aggressive measures they explained to me that he has been deteriorating over the last year to the point where they think he is  not enjoying his life.  I explained to them hospice versus palliative care and that from my experience I do not think we will get much benefit of being aggressive with him.  They agreed and they would like to proceed with comfort measures.  Sacral decubitus ulcer stage II present on admission RN Pressure Injury Documentation: Pressure Injury 11/28/18 Stage II -  Partial thickness loss of dermis presenting as a shallow open ulcer with a red, pink wound bed without slough. this is a partial thickness lesion of unknown etiology, NOT a pressure injury (Active)  11/28/18 1858  Location: Buttocks  Location Orientation: Right  Staging: Stage II -  Partial thickness loss of dermis presenting as a shallow open ulcer with a red, pink wound bed without slough.  Wound Description (Comments): this is a partial thickness lesion of unknown etiology, NOT a pressure injury  Present on Admission: Yes     Pressure Injury 12/29/18 Unstageable - Full thickness tissue loss in which the base of the ulcer is covered by slough (yellow, tan, gray, green or brown) and/or eschar (tan, brown or black) in the wound bed. dry crusted healing pressure ulcer (Active)  12/29/18 0054  Location: Buttocks  Location Orientation: Right;Left  Staging: Unstageable - Full thickness tissue loss in which the base of the ulcer is covered by slough (yellow, tan, gray, green or brown) and/or eschar (tan, brown or black) in the wound bed.  Wound Description (Comments): dry crusted healing pressure ulcer  Present on Admission: Yes     Pressure Injury 12/29/18 Unstageable - Full thickness tissue loss in which the base of the ulcer is covered by slough (yellow, tan, gray, green or brown) and/or eschar (tan, brown or black) in the wound bed. (Active)  12/29/18 0054  Location:  Buttocks  Location Orientation: Left  Staging: Unstageable - Full thickness tissue loss in which the base of the ulcer is covered by slough (yellow, tan, gray, green or  brown) and/or eschar (tan, brown or black) in the wound bed.  Wound Description (Comments):   Present on Admission: Yes     Pressure Injury Stage 1 -  Intact skin with non-blanchable redness of a localized area usually over a bony prominence. Moisture associated skin damage (Active)     Location:   Location Orientation:   Staging: Stage 1 -  Intact skin with non-blanchable redness of a localized area usually over a bony prominence.  Wound Description (Comments): Moisture associated skin damage  Present on Admission: Yes     Pressure Injury 03/30/20 Buttocks Left Stage 1 -  Intact skin with non-blanchable redness of a localized area usually over a bony prominence. Moisture associated damage (Active)  03/30/20 0036  Location: Buttocks  Location Orientation: Left  Staging: Stage 1 -  Intact skin with non-blanchable redness of a localized area usually over a bony prominence.  Wound Description (Comments): Moisture associated damage  Present on Admission: Yes    Estimated body mass index is 25.74 kg/m as calculated from the following:   Height as of this encounter: 6' (1.829 m).   Weight as of this encounter: 86.1 kg. Malnutrition Type:  Nutrition Problem: Moderate Malnutrition Etiology: chronic illness(CKD stage IV, inadequate oral intake)   Malnutrition Characteristics:  Signs/Symptoms: moderate fat depletion, moderate muscle depletion   Nutrition Interventions:  Interventions: Tube feeding, Prostat  DVT prophylaxis: lovenox Family Communication:Sone Status is: Inpatient  Remains inpatient appropriate because:Hemodynamically unstable   Dispo: The patient is from: Home              Anticipated d/c is to: Home              Anticipated d/c date is: 2 days              Patient currently is not medically stable to d/c.         Code Status:     Code Status Orders  (From admission, onward)         Start     Ordered   04/06/2020 0350  Do not attempt  resuscitation (DNR)  Continuous    Question Answer Comment  In the event of cardiac or respiratory ARREST Do not call a "code blue"   In the event of cardiac or respiratory ARREST Do not perform Intubation, CPR, defibrillation or ACLS   In the event of cardiac or respiratory ARREST Use medication by any route, position, wound care, and other measures to relive pain and suffering. May use oxygen, suction and manual treatment of airway obstruction as needed for comfort.      03/26/2020 0355        Code Status History    Date Active Date Inactive Code Status Order ID Comments User Context   11/12/2019 1825 11/16/2019 2158 DNR [id number]  Max Sane, MD Inpatient   11/12/2019 1825 11/12/2019 1825 DNR [id number]  Max Sane, MD Inpatient   12/29/2018 0012 01/04/2019 1949 DNR [id number]  Lance Coon, MD Inpatient   11/29/2018 1201 12/04/2018 2149 DNR [id number]  Dustin Flock, MD Inpatient   11/28/2018 1800 11/29/2018 1201 Full Code [id number]  Henreitta Leber, MD Inpatient   Advance Care Planning Activity        IV Access:    Peripheral IV   Procedures and diagnostic studies:  CT HEAD WO CONTRAST  Result Date: 04/04/2020 CLINICAL DATA:  Encephalopathy. EXAM: CT HEAD WITHOUT CONTRAST TECHNIQUE: Contiguous axial images were obtained from the base of the skull through the vertex without intravenous contrast. COMPARISON:  Head CT 12/30/2018, 11/12/2019, 03/30/2020 FINDINGS: Brain: There is no mass, hemorrhage or extra-axial collection. The size and configuration of the ventricles and extra-axial CSF spaces are normal. There is hypoattenuation of the white matter, most commonly indicating chronic small vessel disease. Expected evolution of left basal ganglia subacute infarct. Chronic right convexity subdural hematoma has resolved. Vascular: No abnormal hyperdensity of the major intracranial arteries or dural venous sinuses. No intracranial atherosclerosis. Skull: The visualized skull base,  calvarium and extracranial soft tissues are normal. Sinuses/Orbits: No fluid levels or advanced mucosal thickening of the visualized paranasal sinuses. No mastoid or middle ear effusion. The orbits are normal. IMPRESSION: 1. No acute intracranial abnormality. 2. Expected evolution of left basal ganglia subacute infarct. 3. Chronic small vessel disease. Electronically Signed   By: Ulyses Jarred M.D.   On: 04/04/2020 15:08     Medical Consultants:    None.  Anti-Infectives:   none  Subjective:    Richard [name] nonverbal  Objective:    Vitals:   04/04/20 2229 04/05/20 0044 04/05/20 0449 04/05/20 0740  BP: (!) 155/86 (!) 154/70  (!) 144/76  Pulse: 87 69  82  Resp: 14 16  (!) 22  Temp: 97.7 F (36.5 C) 98.4 F (36.9 C)  98.1 F (36.7 C)  TempSrc: Oral Oral  Axillary  SpO2: 95% 96%  100%  Weight:   86.1 kg   Height:       SpO2: 100 %   Intake/Output Summary (Last 24 hours) at 04/05/2020 1253 Last data filed at 04/05/2020 0804 Gross per 24 hour  Intake 5692.41 ml  Output 1150 ml  Net 4542.41 ml   Filed Weights   04/03/20 0500 04/04/20 0600 04/05/20 0449  Weight: 80.4 kg 84.4 kg 86.1 kg    Exam: General exam: In no acute distress. Respiratory system: Good air movement and clear to auscultation. Cardiovascular system: S1 & S2 heard, RRR. No  Gastrointestinal system: Abdomen is nondistended, soft and nontender.  Central nervous system: Unresponsive   Data Reviewed:    Labs: Basic Metabolic Panel: Recent Labs  Lab 04/01/20 0413 04/01/20 0413 04/02/20 0457 04/02/20 0457 04/03/20 0426 04/04/20 0427  NA 144  --  145  --  149* 149*  K 4.6   < > 4.8   < > 4.1 4.4  CL 114*  --  117*  --  122* 119*  CO2 20*  --  19*  --  19* 22  GLUCOSE 261*  --  220*  --  129* 214*  BUN 70*  --  89*  --  94* 98*  CREATININE 2.81*  --  3.10*  --  2.79* 2.81*  CALCIUM 8.6*  --  8.6*  --  8.5* 8.5*  MG 1.7  --   --   --   --   --   PHOS 3.6  --   --   --   --   --    < >  = values in this interval not displayed.   GFR Estimated Creatinine Clearance: 20.7 mL/min (A) (by C-G formula based on SCr of 2.81 mg/dL (H)). Liver Function Tests: No results for input(s): AST, ALT, ALKPHOS, BILITOT, PROT, ALBUMIN in the last 168 hours. No results for input(s): LIPASE, AMYLASE in the last 168  hours. No results for input(s): AMMONIA in the last 168 hours. Coagulation profile Recent Labs  Lab 03/30/20 0501 03/31/20 0547 04/01/20 0413 04/02/20 0457  INR 2.2* 2.7* 2.8* 1.9*   COVID-19 Labs  No results for input(s): DDIMER, FERRITIN, LDH, CRP in the last 72 hours.  Lab Results  Component Value Date   SARSCOV2NAA NEGATIVE 04/02/2020   [place] NEGATIVE 11/12/2019    CBC: Recent Labs  Lab 04/01/20 0413 04/03/20 0426 04/04/20 0427 04/05/20 0537  WBC 13.5* 13.1* 15.7* 17.1*  NEUTROABS  --  10.3* 12.3*  --   HGB 13.8 12.6* 12.2* 11.4*  HCT 41.4 38.8* 37.2* 35.5*  MCV 96.5 98.0 97.4 98.3  PLT 234 220 221 189   Cardiac Enzymes: No results for input(s): CKTOTAL, CKMB, CKMBINDEX, TROPONINI in the last 168 hours. BNP (last 3 results) No results for input(s): PROBNP in the last 8760 hours. CBG: Recent Labs  Lab 04/04/20 2048 04/05/20 0045 04/05/20 0433 04/05/20 0740 04/05/20 1140  GLUCAP 199* 213* 181* 184* 192*   D-Dimer: No results for input(s): DDIMER in the last 72 hours. Hgb A1c: No results for input(s): HGBA1C in the last 72 hours. Lipid Profile: No results for input(s): CHOL, HDL, LDLCALC, TRIG, CHOLHDL, LDLDIRECT in the last 72 hours. Thyroid function studies: No results for input(s): TSH, T4TOTAL, T3FREE, THYROIDAB in the last 72 hours.  Invalid input(s): FREET3 Anemia work up: No results for input(s): VITAMINB12, FOLATE, FERRITIN, TIBC, IRON, RETICCTPCT in the last 72 hours. Sepsis Labs: Recent Labs  Lab 04/01/20 0413 04/03/20 0426 04/04/20 0427 04/05/20 0537  WBC 13.5* 13.1* 15.7* 17.1*   Microbiology Recent Results (from  the past 240 hour(s))  Respiratory Panel by RT PCR (Flu A&B, Covid) - Nasopharyngeal Swab     Status: None   Collection Time: 04/01/2020 12:52 AM   Specimen: Nasopharyngeal Swab  Result Value Ref Range Status   SARS Coronavirus 2 by RT PCR NEGATIVE NEGATIVE Final    Comment: (NOTE) SARS-CoV-2 target nucleic acids are NOT DETECTED. The SARS-CoV-2 RNA is generally detectable in upper respiratoy specimens during the acute phase of infection. The lowest concentration of SARS-CoV-2 viral copies this assay can detect is 131 copies/mL. A negative result does not preclude SARS-Cov-2 infection and should not be used as the sole basis for treatment or other patient management decisions. A negative result may occur with  improper specimen collection/handling, submission of specimen other than nasopharyngeal swab, presence of viral mutation(s) within the areas targeted by this assay, and inadequate number of viral copies (<131 copies/mL). A negative result must be combined with clinical observations, patient history, and epidemiological information. The expected result is Negative. Fact Sheet for Patients:  PinkCheek.be Fact Sheet for Healthcare Providers:  GravelBags.it This test is not yet ap proved or cleared by the [place] FDA and  has been authorized for detection and/or diagnosis of SARS-CoV-2 by FDA under an Emergency Use Authorization (EUA). This EUA will remain  in effect (meaning this test can be used) for the duration of the COVID-19 declaration under Section 564(b)(1) of the Act, 21 U.S.C. section 360bbb-3(b)(1), unless the authorization is terminated or revoked sooner.    Influenza A by PCR NEGATIVE NEGATIVE Final   Influenza B by PCR NEGATIVE NEGATIVE Final    Comment: (NOTE) The Xpert Xpress SARS-CoV-2/FLU/RSV assay is intended as an aid in  the diagnosis of influenza from Nasopharyngeal swab specimens and  should  not be used as a sole basis for treatment. Nasal washings and  aspirates  are unacceptable for Xpert Xpress SARS-CoV-2/FLU/RSV  testing. Fact Sheet for Patients: PinkCheek.be Fact Sheet for Healthcare Providers: GravelBags.it This test is not yet approved or cleared by the [place] FDA and  has been authorized for detection and/or diagnosis of SARS-CoV-2 by  FDA under an Emergency Use Authorization (EUA). This EUA will remain  in effect (meaning this test can be used) for the duration of the  Covid-19 declaration under Section 564(b)(1) of the Act, 21  U.S.C. section 360bbb-3(b)(1), unless the authorization is  terminated or revoked. Performed at [facility], [place], [place] [address]   C Difficile Quick Screen w PCR reflex     Status: None   Collection Time: 03/31/20 10:55 AM   Specimen: STOOL  Result Value Ref Range Status   C Diff antigen NEGATIVE NEGATIVE Final   C Diff toxin NEGATIVE NEGATIVE Final   C Diff interpretation No C. difficile detected.  Final    Comment: Performed at [facility], [address] [place], [place] [address]  Urine Culture     Status: Abnormal (Preliminary result)   Collection Time: 04/03/20  4:17 PM   Specimen: Urine, Random  Result Value Ref Range Status   Specimen Description   Final    URINE, RANDOM Performed at [facility], [address] [place], [place] [address]    Special Requests   Final    NONE Performed at [facility], [address] [place], [place] [address]    Culture (A)  Final    >=100,000 COLONIES/mL ESCHERICHIA COLI SUSCEPTIBILITIES TO FOLLOW Performed at [facility], [place] [address] [place], [place] [address]    Report Status PENDING  Incomplete     Medications:   .  stroke: mapping our early stages of recovery book   Does not apply Once  . aspirin  81 mg Oral Daily  . atorvastatin  40 mg  Oral Daily  . cefTRIAXone (ROCEPHIN) IM  1 g Intramuscular Q24H  . cholecalciferol  2,000 Units Oral Daily  . febuxostat  40 mg Oral QODAY  . feeding supplement (PRO-STAT SUGAR FREE 64)  30 mL Per Tube BID  . free water  150 mL Per Tube Q4H  . insulin aspart  0-5 Units Subcutaneous QHS  . insulin aspart  0-9 Units Subcutaneous TID WC  . insulin aspart  2 Units Subcutaneous TID WC  . insulin glargine  10 Units Subcutaneous BID  . metoprolol tartrate  12.5 mg Per Tube BID   Continuous Infusions: . chlorproMAZINE (THORAZINE) IV 12.5 mg (04/05/20 0521)  . dextrose 75 mL/hr at 04/05/20 1140  . feeding supplement (JEVITY 1.5 CAL/FIBER) 1,000 mL (04/04/20 1529)  . heparin 1,700 Units/hr (04/05/20 0610)      LOS: 6 days   Charlynne Cousins  [facility]  04/05/2020, 12:53 PM

## 2020-04-05 NOTE — Progress Notes (Signed)
Physical Therapy Treatment Patient Details Name: Richard [name] MRN: [id number] DOB: Nov 24, 1931 Today's Date: 04/05/2020    History of Present Illness Taylen Osorto is an 89yoM who comes to [facility] 4/14 c acute onset right hemi weakness. Pt reports inability to raise the right arm or move the right leg as well as right-sided facial droop with drooling and slurred speech. By time of arrival, symptoms mostly resolved. At baseline patient ambulates with a cane. 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 MRI shows: Small acute perforator infarction at the left basal ganglia and corona radiata. Thin (2 mm) subdural collection or dural thickening along the right frontal parietal convexity, likely subacute hematoma.    PT Comments    Pt lethargic in bed with little to no interaction.  Only opens eyes briefly during session.  BLE PROM , difficult to tell if pt is assisting but if so only minimal amounts.  Overall seems generally comfortable but hiccups noted during session.  HOB >30 degrees with tube feeding running.  Unable to progress mobility.   Follow Up Recommendations  SNF;Supervision/Assistance - 24 hour     Equipment Recommendations  None recommended by PT;Other (comment)    Recommendations for Other Services       Precautions / Restrictions Precautions Precautions: Fall Precaution Comments: High Fall Restrictions Weight Bearing Restrictions: No    Mobility  Bed Mobility               General bed mobility comments: deferred this date, pt with limited participation  Transfers                 General transfer comment: deferred  Ambulation/Gait                 Stairs             Wheelchair Mobility    Modified Rankin (Stroke Patients Only)        Balance                                            Cognition Arousal/Alertness: Lethargic Behavior During Therapy: Flat affect Overall Cognitive Status: Difficult to assess                                 General Comments: little to no interaction this session.      Exercises Other Exercises Other Exercises: BLE P/minimal AAROM for ankle pumps, heel slides, ab/add and SLR x 10    General Comments        Pertinent Vitals/Pain Pain Assessment: No/denies pain    Home Living                      Prior Function            PT Goals (current goals can now be found in the care plan section) Progress towards PT goals: Not progressing toward goals - comment    Frequency    7X/week      PT Plan Current plan remains appropriate    Co-evaluation              AM-PAC  PT "6 Clicks" Mobility   Outcome Measure  Help needed turning from your back to your side while in a flat bed without using bedrails?: Total Help needed moving from lying on your back to sitting on the side of a flat bed without using bedrails?: Total Help needed moving to and from a bed to a chair (including a wheelchair)?: Total Help needed standing up from a chair using your arms (e.g., wheelchair or bedside chair)?: Total Help needed to walk in hospital room?: Total Help needed climbing 3-5 steps with a railing? : Total 6 Click Score: 6    End of Session   Activity Tolerance: Patient limited by fatigue Patient left: in bed;with call bell/phone within reach;with bed alarm set Nurse Communication: Mobility status       Time: 1005-1013 PT Time Calculation (min) (ACUTE ONLY): 8 min  Charges:  $Therapeutic Exercise: 8-22 mins                    Chesley Noon, PTA 04/05/20, 10:19 AM

## 2020-04-05 NOTE — Progress Notes (Signed)
SLP Cancellation Note  Patient Details Name: Richard [name] MRN: [id number] DOB: May 07, 1931   Cancelled treatment:       Reason Eval/Treat Not Completed: (chart reviewed; consulted MD re: pt's case, MBSS f/u need). ST services will f/u w/ MBSS in the morning per MD order. Pt has NG in place for nutrition. Recommend continued oral care for hygiene and stimulation of swallowing.    Orinda Kenner, MS, CCC-SLP Cassandre Oleksy 04/05/2020, 1:19 PM

## 2020-04-05 NOTE — Plan of Care (Addendum)
PMT note:  Patient is resting in bed.  No family at bedside. He does not open his eyes or speak with verbal stimuli or touch. Called son and VM left with team phone number.

## 2020-04-05 NOTE — Consult Note (Signed)
[facility] CONSULT NOTE    Date: 04/05/2020                  Patient Name:  Richard [name]  MRN: [id number]  DOB: [birth year]  Age / Sex: 86 y.o., male         PCP: [name], MD                 Service Requesting Consult:  Hospitalist                 Reason for Consult:  Acute kidney injury, chronic kidney disease stage IV            History of Present Illness: Patient is a 86 y.o. male with a PMHx of paroxysmal atrial fibrillation, prostate cancer, chronic kidney disease stage IV, diabetes mellitus type 2, hypertension, who was admitted to [facility] on 03/28/2020 for evaluation of right-sided weakness.  Patient unfortunately unable to provide any history.  Son is currently at the bedside.  Patient developed severe right-sided weakness with inability to raise the right arm or the right leg along with right-sided facial droop upon the day of admission.  EMS was called 2 hours after symptom onset.  He still has significant weakness on the right side.  We are asked to see him for evaluation management of acute kidney injury.  His recent baseline creatinine has been 2.2 with an EGFR of 26.  Creatinine a bit higher now at 2.8.  He is receiving tube feeds at the moment.   Medications: Outpatient medications: Medications Prior to Admission  Medication Sig Dispense Refill Last Dose  . Alpha-Lipoic Acid 200 MG CAPS Take 1 capsule by mouth daily.    03/28/2020 at Unknown time  . aspirin 81 MG chewable tablet Chew 1 tablet (81 mg total) by mouth daily. 90 tablet 0 03/28/2020 at Unknown time  . atorvastatin (LIPITOR) 40 MG tablet Take 1 tablet (40 mg total) by mouth daily. 90 tablet 2 03/28/2020 at Unknown time  . Calcium Carbonate (CALCIUM 600 PO) Take 1 tablet by mouth daily.    03/28/2020 at Unknown time  . Cholecalciferol (VITAMIN D) 2000 UNITS tablet Take 2,000 Units by mouth daily.     03/28/2020 at Unknown time  . Coenzyme Q10 10 MG capsule Take 10 mg by mouth.    03/28/2020 at Unknown time  . febuxostat (ULORIC) 40 MG tablet Take 1 tablet by mouth every other day.    03/28/2020 at Unknown time  . glimepiride (AMARYL) 4 MG tablet Take 4 mg by mouth daily with breakfast.   11 03/28/2020 at Unknown time  . lisinopril (ZESTRIL) 10 MG tablet Take 1 tablet (10 mg total) by mouth daily. 90 tablet 2 03/28/2020 at Unknown time  . metoprolol tartrate (LOPRESSOR) 25 MG tablet Take 0.5 tablets (12.5 mg total) by mouth 2 (two) times daily. 60 tablet 0 03/28/2020 at Unknown time  . Multiple Vitamins-Minerals (MULTIVITAMIN ADULT EXTRA C PO) Take 1 tablet by mouth daily.    03/28/2020 at Unknown time  . Red Yeast Rice 600 MG TABS Take 1 tablet by mouth daily.    03/28/2020 at Unknown time  . senna-docusate (SENOKOT-S) 8.6-50 MG tablet Take 1 tablet by mouth 2 (two) times daily.   03/28/2020 at Unknown time  . Vitamins A & D (VITAMIN A & D) [address] UNITS TABS Take 1 tablet by mouth daily.    03/28/2020 at Unknown time  . warfarin (COUMADIN) 5 MG tablet  Take 5 mg by mouth daily.   03/28/2020 at Unknown time  . colchicine 0.6 MG tablet Take 0.6 mg by mouth.   Not Taking at Unknown time    Current medications: Current Facility-Administered Medications  Medication Dose Route Frequency Provider Last Rate Last Admin  .  stroke: mapping our early stages of recovery book   Does not apply Once Athena Masse, MD   Stopped at 03/21/2020 0930  . acetaminophen (TYLENOL) tablet 650 mg  650 mg Oral Q6H PRN Charlynne Cousins, MD       Or  . acetaminophen (TYLENOL) suppository 650 mg  650 mg Rectal Q6H PRN Charlynne Cousins, MD      . antiseptic oral rinse (BIOTENE) solution 15 mL  15 mL Topical PRN Charlynne Cousins, MD      . chlorproMAZINE (THORAZINE) 12.5 mg in sodium chloride 0.9 % 25 mL IVPB  12.5 mg Intravenous Q6H PRN Charlynne Cousins, MD      . glycopyrrolate (ROBINUL) tablet 1 mg  1 mg Oral Q4H PRN Charlynne Cousins, MD       Or  . glycopyrrolate (ROBINUL) injection  0.2 mg  0.2 mg Subcutaneous Q4H PRN Charlynne Cousins, MD       Or  . glycopyrrolate (ROBINUL) injection 0.2 mg  0.2 mg Intravenous Q4H PRN Charlynne Cousins, MD      . haloperidol (HALDOL) tablet 0.5 mg  0.5 mg Oral Q4H PRN Charlynne Cousins, MD       Or  . haloperidol (HALDOL) 2 MG/ML solution 0.5 mg  0.5 mg Sublingual Q4H PRN Charlynne Cousins, MD       Or  . haloperidol lactate (HALDOL) injection 0.5 mg  0.5 mg Intravenous Q4H PRN Charlynne Cousins, MD      . LORazepam (ATIVAN) tablet 1 mg  1 mg Oral Q4H PRN Charlynne Cousins, MD       Or  . LORazepam (ATIVAN) 2 MG/ML concentrated solution 1 mg  1 mg Sublingual Q4H PRN Charlynne Cousins, MD       Or  . LORazepam (ATIVAN) injection 1 mg  1 mg Intravenous Q4H PRN Charlynne Cousins, MD      . morphine 115m in NS 1057m(10m9mL) infusion - premix  1 mg/hr Intravenous Continuous FelCharlynne CousinsD 1 mL/hr at 04/05/20 1406 1 mg/hr at 04/05/20 1406  . ondansetron (ZOFRAN-ODT) disintegrating tablet 4 mg  4 mg Oral Q6H PRN FelCharlynne CousinsD       Or  . ondansetron (ZOSwedishamerican Medical Center Belviderenjection 4 mg  4 mg Intravenous Q6H PRN FelCharlynne CousinsD      . polyvinyl alcohol (LIQUIFILM TEARS) 1.4 % ophthalmic solution 1 drop  1 drop Both Eyes QID PRN FelCharlynne CousinsD          Allergies: No Known Allergies    Past Medical History: Past Medical History:  Diagnosis Date  . Atrial fibrillation (H[place] . Cancer (HCCMartinton . Chronic kidney disease   . Colon polyp   . Diabetes mellitus   . History of blood clotting disorder   . HTN (hypertension)      Past Surgical History: Past Surgical History:  Procedure Laterality Date  . AMPUTATION TOE Right 11/29/2018   Procedure: AMPUTATION TOE;  Surgeon: TroAlbertine PatriciaPM;  Location: [facility] ORS;  Service: Podiatry;  Laterality: Right;  . EYE SURGERY     CATARACTS  . HERNIA  REPAIR    . LOWER EXTREMITY ANGIOGRAPHY Right 12/02/2018   Procedure: Lower Extremity  Angiography;  Surgeon: Algernon Huxley, MD;  Location: [facility];  Service: Cardiovascular;  Laterality: Right;  . SKIN CANCER EXCISION       Family History: History reviewed. No pertinent family history.   Social History: Social History   Socioeconomic History  . Marital status: Widowed    Spouse name: Not on file  . Number of children: Not on file  . Years of education: Not on file  . Highest education level: Not on file  Occupational History  . Occupation: retired    Comment: Theatre manager  . 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 Smoking status: Never Smoker  . Smokeless tobacco: Never Used  Substance and Sexual Activity  . Alcohol use: No  . Drug use: Never  . Sexual activity: Not Currently  Other Topics Concern  . Not on file  Social History Narrative  . Not on file   Social Determinants of Health   Financial Resource Strain:   . Difficulty of Paying Living Expenses:   Food Insecurity:   . Worried About Charity fundraiser in the Last Year:   . Arboriculturist in the Last Year:   Transportation Needs:   . Film/video editor (Medical):   Marland Kitchen Lack of Transportation (Non-Medical):   Physical Activity:   . Days of Exercise per Week:   . Minutes of Exercise per Session:   Stress:   . Feeling of Stress :   Social Connections:   . Frequency of Communication with Friends and Family:   . Frequency of Social Gatherings with Friends and Family:   . Attends Religious Services:   . Active Member of Clubs or Organizations:   . Attends Archivist Meetings:   Marland Kitchen Marital Status:   Intimate Partner Violence:   . Fear of Current or Ex-Partner:   . Emotionally Abused:   Marland Kitchen Physically Abused:   . Sexually Abused:      Review of Systems: Patient unable to provide review of systems as he is unable to speak.  Vital Signs: Blood pressure (!) 144/76, pulse 82, temperature 98.1 F (36.7 C), temperature source  Axillary, resp. rate (!) 22, height 6' (1.829 m), weight 86.1 kg, SpO2 100 %.  Weight trends: Filed Weights   04/03/20 0500 04/04/20 0600 04/05/20 0449  Weight: 80.4 kg 84.4 kg 86.1 kg    Physical Exam: General: NAD, laying in bed.  Head: Normocephalic, atraumatic.  Eyes: Anicteric  Nose: Mucous membranes moist, not inflammed, nonerythematous.  Throat: Oropharynx nonerythematous, no exudate appreciated.   Neck: Supple, trachea midline.  Lungs:  Normal respiratory effort. Clear to auscultation BL without crackles or wheezes.  Heart: Irregular  Abdomen:  BS normoactive. Soft, Nondistended, non-tender.  No masses or organomegaly.  Extremities: trace pretibial edema.  Neurologic: Arousable, follows commands with his left side.  Right side flaccid  Skin: No visible rashes, scars.    Lab results: Basic Metabolic Panel: Recent Labs  Lab 04/01/20 0413 04/02/20 0457 04/03/20 0426 04/04/20 0427 04/05/20 1314  NA 144   < > 149* 149* 138  K 4.6   < > 4.1 4.4 3.9  CL 114*   < > 122* 119* 111  CO2 20*   < > 19* 22 19*  GLUCOSE 261*   < > 129* 214* 201*  BUN 70*   < >  94* 98* 107*  CREATININE 2.81*   < > 2.79* 2.81* 2.64*  CALCIUM 8.6*   < > 8.5* 8.5* 7.8*  MG 1.7  --   --   --   --   PHOS 3.6  --   --   --   --    < > = values in this interval not displayed.    Liver Function Tests: No results for input(s): AST, ALT, ALKPHOS, BILITOT, PROT, ALBUMIN in the last 168 hours. No results for input(s): LIPASE, AMYLASE in the last 168 hours. No results for input(s): AMMONIA in the last 168 hours.  CBC: Recent Labs  Lab 04/01/20 0413 04/01/20 0413 04/03/20 0426 04/03/20 0426 04/04/20 0427 04/05/20 0537 04/05/20 1314  WBC 13.5*   < > 13.1*   < > 15.7* 17.1* 20.5*  NEUTROABS  --   --  10.3*  --  12.3*  --  16.9*  HGB 13.8   < > 12.6*   < > 12.2* 11.4* 12.0*  HCT 41.4   < > 38.8*   < > 37.2* 35.5* 37.6*  MCV 96.5  --  98.0  --  97.4 98.3 99.5  PLT 234   < > 220   < > 221 189  187   < > = values in this interval not displayed.    Cardiac Enzymes: No results for input(s): CKTOTAL, CKMB, CKMBINDEX, TROPONINI in the last 168 hours.  BNP: Invalid input(s): POCBNP  CBG: Recent Labs  Lab 04/04/20 2048 04/05/20 0045 04/05/20 0433 04/05/20 0740 04/05/20 1140  GLUCAP 199* 213* 181* 184* 192*    Microbiology: Results for orders placed or performed during the hospital encounter of 03/16/2020  Respiratory Panel by RT PCR (Flu A&B, Covid) - Nasopharyngeal Swab     Status: None   Collection Time: 03/28/2020 12:52 AM   Specimen: Nasopharyngeal Swab  Result Value Ref Range Status   SARS Coronavirus 2 by RT PCR NEGATIVE NEGATIVE Final    Comment: (NOTE) SARS-CoV-2 target nucleic acids are NOT DETECTED. The SARS-CoV-2 RNA is generally detectable in upper respiratoy specimens during the acute phase of infection. The lowest concentration of SARS-CoV-2 viral copies this assay can detect is 131 copies/mL. A negative result does not preclude SARS-Cov-2 infection and should not be used as the sole basis for treatment or other patient management decisions. A negative result may occur with  improper specimen collection/handling, submission of specimen other than nasopharyngeal swab, presence of viral mutation(s) within the areas targeted by this assay, and inadequate number of viral copies (<131 copies/mL). A negative result must be combined with clinical observations, patient history, and epidemiological information. The expected result is Negative. Fact Sheet for Patients:  PinkCheek.be Fact Sheet for Healthcare Providers:  GravelBags.it This test is not yet ap proved or cleared by the [place] FDA and  has been authorized for detection and/or diagnosis of SARS-CoV-2 by FDA under an Emergency Use Authorization (EUA). This EUA will remain  in effect (meaning this test can be used) for the duration of  the COVID-19 declaration under Section 564(b)(1) of the Act, 21 U.S.C. section 360bbb-3(b)(1), unless the authorization is terminated or revoked sooner.    Influenza A by PCR NEGATIVE NEGATIVE Final   Influenza B by PCR NEGATIVE NEGATIVE Final    Comment: (NOTE) The Xpert Xpress SARS-CoV-2/FLU/RSV assay is intended as an aid in  the diagnosis of influenza from Nasopharyngeal swab specimens and  should not be used as a sole basis for treatment. Nasal  washings and  aspirates are unacceptable for Xpert Xpress SARS-CoV-2/FLU/RSV  testing. Fact Sheet for Patients: PinkCheek.be Fact Sheet for Healthcare Providers: GravelBags.it This test is not yet approved or cleared by the [place] FDA and  has been authorized for detection and/or diagnosis of SARS-CoV-2 by  FDA under an Emergency Use Authorization (EUA). This EUA will remain  in effect (meaning this test can be used) for the duration of the  Covid-19 declaration under Section 564(b)(1) of the Act, 21  U.S.C. section 360bbb-3(b)(1), unless the authorization is  terminated or revoked. Performed at [facility], [place], [place] [address]   C Difficile Quick Screen w PCR reflex     Status: None   Collection Time: 03/31/20 10:55 AM   Specimen: STOOL  Result Value Ref Range Status   C Diff antigen NEGATIVE NEGATIVE Final   C Diff toxin NEGATIVE NEGATIVE Final   C Diff interpretation No C. difficile detected.  Final    Comment: Performed at [facility], [address] [place], [place] [address]  Urine Culture     Status: Abnormal (Preliminary result)   Collection Time: 04/03/20  4:17 PM   Specimen: Urine, Random  Result Value Ref Range Status   Specimen Description   Final    URINE, RANDOM Performed at [facility], [address], [place], [place] [address]    Special Requests   Final    NONE Performed at [facility], [address] [place], [place] [address]    Culture (A)  Final    >=100,000 COLONIES/mL ESCHERICHIA COLI SUSCEPTIBILITIES TO FOLLOW Performed at [facility], [place] [address] [place], [place] [address]    Report Status PENDING  Incomplete    Coagulation Studies: No results for input(s): LABPROT, INR in the last 72 hours.  Urinalysis: Recent Labs    04/03/20 1617  COLORURINE YELLOW*  LABSPEC 1.014  PHURINE 8.0  GLUCOSEU NEGATIVE  HGBUR MODERATE*  BILIRUBINUR NEGATIVE  KETONESUR 5*  PROTEINUR >=300*  NITRITE NEGATIVE  LEUKOCYTESUR LARGE*      Imaging: CT HEAD WO CONTRAST  Result Date: 04/04/2020 CLINICAL DATA:  Encephalopathy. EXAM: CT HEAD WITHOUT CONTRAST TECHNIQUE: Contiguous axial images were obtained from the base of the skull through the vertex without intravenous contrast. COMPARISON:  Head CT 12/30/2018, 11/12/2019, 03/30/2020 FINDINGS: Brain: There is no mass, hemorrhage or extra-axial collection. The size and configuration of the ventricles and extra-axial CSF spaces are normal. There is hypoattenuation of the white matter, most commonly indicating chronic small vessel disease. Expected evolution of left basal ganglia subacute infarct. Chronic right convexity subdural hematoma has resolved. Vascular: No abnormal hyperdensity of the major intracranial arteries or dural venous sinuses. No intracranial atherosclerosis. Skull: The visualized skull base, calvarium and extracranial soft tissues are normal. Sinuses/Orbits: No fluid levels or advanced mucosal thickening of the visualized paranasal sinuses. No mastoid or middle ear effusion. The orbits are normal. IMPRESSION: 1. No acute intracranial abnormality. 2. Expected evolution of left basal ganglia subacute infarct. 3. Chronic small vessel disease. Electronically Signed   By: Ulyses Jarred M.D.   On: 04/04/2020 15:08      Assessment & Plan: Pt is a 86 y.o. male with a PMHx of paroxysmal atrial  fibrillation, prostate cancer, chronic kidney disease stage IV, diabetes mellitus type 2, hypertension, who was admitted to [facility] on 04/12/2020 for evaluation of right-sided weakness.  Patient found to have a small left basal ganglia infarct.  1.  Acute kidney injury/chronic kidney disease stage IV/diabetes mellitus type 2 with chronic kidney disease.  His recent EGFR as an outpatient has been 26.  Renal function worse now and likely related to underlying CVA and diminished intake.  He is on tube feeds at the moment.  Continue to monitor renal parameters closely.  He has had prior history of urinary retention and was previously performing self intermittent catheterization.  It appears that the patient's family however is now opting more towards palliative and comfort care.  Therefore hold off on exploring any underlying obstruction.

## 2020-04-05 NOTE — Progress Notes (Signed)
Nutrition Brief Follow-Up Note  Chart reviewed. Patient now transitioning to comfort care.   No further nutrition interventions warranted at this time. Please re-consult RD as needed.   Sana Tessmer King, MS, RD, LDN Pager number available on Amion   

## 2020-04-06 DIAGNOSIS — N171 Acute kidney failure with acute cortical necrosis: Secondary | ICD-10-CM | POA: Diagnosis not present

## 2020-04-06 DIAGNOSIS — N184 Chronic kidney disease, stage 4 (severe): Secondary | ICD-10-CM | POA: Diagnosis not present

## 2020-04-06 LAB — URINE CULTURE: Culture: >=100000 — AB

## 2020-04-06 NOTE — Plan of Care (Addendum)
PMT note:  Consult noted yesterday. Patient was unable to participate in [place] conversation. Message left for son, no return call. Per staff patient has declined.  Noted this morning plans to discharge home under hospice. It sounds as though goals are clear, and I do not think a palliative consult would be able to add to patient's plan of care. Recommend contacting hospice as planned. Please reconsult if needed.

## 2020-04-06 NOTE — Progress Notes (Signed)
[facility] Room [facility]) Hospital Liaison RN note for Hospice Home  Received request from Lianne Cure, RN with [facility] team of family interest in [place]. Chart reviewed and spoke with son to acknowledge referral. Unfortunately, Hospice Home is not able to offer a room today. Family and TOC team aware [place] liaison will follow up tomorrow if room becomes available.  Please call with any hospice related questions or concerns.  Thank you. Margaretmary Eddy, BSN, RN [facility] Liaison [phone]

## 2020-04-06 NOTE — TOC Progression Note (Signed)
Transition of Care [facility]) - Progression Note    Patient Details  Name: Richard [name] MRN: [id number] Date of Birth: Oct 07, 1931  Transition of Care [facility]) CM/SW Contact  Su Hilt, RN Phone Number: 04/06/2020, 9:25 AM  Clinical Narrative:    Richard [name] at [facility] and requested her to take a look at the patient, I let her know he is being made comfort care.    Expected Discharge Plan: [place] Barriers to Discharge: Continued Medical Work up  Expected Discharge Plan and Services Expected Discharge Plan: [place]   Discharge Planning Services: CM Consult   Living arrangements for the past 2 months: Single Family Home                 DME Arranged: N/A         HH Arranged: RN, PT, OT, Nurse's Aide, Social Work [employer] Agency: [place] ([place]) Date [place]: 03/30/20 Time HH Agency Contacted: 1500 Representative spoke with at [place]: [place] ([place]) Interventions    Readmission Risk Interventions Readmission Risk Prevention Plan 04/05/2020  Transportation Screening Complete  Medication Review Press photographer) Referral to Pharmacy  [place] or Home Care Consult Complete  Palliative Care Screening Complete  Skilled Nursing Facility Complete  Some recent data might be hidden

## 2020-04-06 NOTE — TOC Progression Note (Signed)
Transition of Care [facility]) - Progression Note    Patient Details  Name: Richard [name] MRN: [id number] Date of Birth: 1931/11/05  Transition of Care [facility]) CM/SW Contact  Su Hilt, RN Phone Number: 04/06/2020, 10:34 AM  Clinical Narrative:    Lowella Fairy the patient's son to clarify he said they do want the patient to go to hospice house if he is not able to stay at the hospital but they prefer the patient to die here at the hospital.  I notified Olivia Mackie with [facility] that they do want a hospice bed if unable to stay here at the hospital to die  Expected Discharge Plan: [place] Barriers to Discharge: Continued Medical Work up  Expected Discharge Plan and Services Expected Discharge Plan: [place]   Discharge Planning Services: CM Consult   Living arrangements for the past 2 months: Single Family Home                 DME Arranged: N/A         HH Arranged: RN, PT, OT, Nurse's Aide, Social Work [employer] Agency: [place] ([place]) Date [place]: 03/30/20 Time HH Agency Contacted: 1500 Representative spoke with at [place]: [place] ([place]) Interventions    Readmission Risk Interventions Readmission Risk Prevention Plan 04/05/2020  Transportation Screening Complete  Medication Review Press photographer) Referral to Pharmacy  [place] or Home Care Consult Complete  Palliative Care Screening Complete  Skilled Nursing Facility Complete  Some recent data might be hidden

## 2020-04-06 NOTE — Care Management Important Message (Signed)
Important Message  Patient Details  Name: Richard [name] MRN: [id number] Date of Birth: 1931/06/08   Medicare Important Message Given:  Other (see comment)  Patient on comfort measures and out of respect of patient and family no Important Message given.   [name] 04/06/2020, 12:14 PM

## 2020-04-06 NOTE — Progress Notes (Signed)
[facility] PROGRESS NOTE    Progress Note  Richard [name]  EUM:[id number] DOB: 1931-09-13 DOA: 04/08/2020 PCP: [name], MD     Brief Narrative:   Richard [name] is an 86 y.o. male past medical history significant for paroxysmal atrial fibrillation on chronic Coumadin, chronic kidney disease stage IV, factor V Leyden deficiency, with a history of BKA, uncontrolled diabetes mellitus, hypertension and CVA with neurogenic bladder who self catheterize comes into the ED with sudden onset of severe right-sided weakness and facial droop with slurred speech  Assessment/Plan:   Acute CVA (cerebrovascular accident) [facility]) Patient appears comfortable on IV morphine, family was comfortable with decision noted to move towards comfort care. They would like the patient to pass away in house. The patient is agonal and mottling he is also third spacing so he probably has hours to days continue to morphine drip and try to keep him as comfortable as we can, patient will probably pass away in house.  Dysphagia/moderate protein caloric malnutrition: Going towards comfort care Hypovolemic hypernatremia: PAF (paroxysmal atrial fibrillation) (HCC) UTI: Worsening leukocytosis: Acute kidney injury on chronic kidney disease stage IV: Essential HTN (hypertension) Type 2 diabetes mellitus with diabetic peripheral angiopathy without gangrene (HCC) Neurogenic bladder; Goals of care/ethics:   Sacral decubitus ulcer stage II present on admission RN Pressure Injury Documentation: Pressure Injury 11/28/18 Stage II -  Partial thickness loss of dermis presenting as a shallow open ulcer with a red, pink wound bed without slough. this is a partial thickness lesion of unknown etiology, NOT a pressure injury (Active)  11/28/18 1858  Location: Buttocks  Location Orientation: Right  Staging: Stage II -  Partial thickness loss of dermis presenting as a shallow open ulcer with a red, pink wound bed without  slough.  Wound Description (Comments): this is a partial thickness lesion of unknown etiology, NOT a pressure injury  Present on Admission: Yes     Pressure Injury 12/29/18 Unstageable - Full thickness tissue loss in which the base of the ulcer is covered by slough (yellow, tan, gray, green or brown) and/or eschar (tan, brown or black) in the wound bed. dry crusted healing pressure ulcer (Active)  12/29/18 0054  Location: Buttocks  Location Orientation: Right;Left  Staging: Unstageable - Full thickness tissue loss in which the base of the ulcer is covered by slough (yellow, tan, gray, green or brown) and/or eschar (tan, brown or black) in the wound bed.  Wound Description (Comments): dry crusted healing pressure ulcer  Present on Admission: Yes     Pressure Injury 12/29/18 Unstageable - Full thickness tissue loss in which the base of the ulcer is covered by slough (yellow, tan, gray, green or brown) and/or eschar (tan, brown or black) in the wound bed. (Active)  12/29/18 0054  Location: Buttocks  Location Orientation: Left  Staging: Unstageable - Full thickness tissue loss in which the base of the ulcer is covered by slough (yellow, tan, gray, green or brown) and/or eschar (tan, brown or black) in the wound bed.  Wound Description (Comments):   Present on Admission: Yes     Pressure Injury Stage 1 -  Intact skin with non-blanchable redness of a localized area usually over a bony prominence. Moisture associated skin damage (Active)     Location:   Location Orientation:   Staging: Stage 1 -  Intact skin with non-blanchable redness of a localized area usually over a bony prominence.  Wound Description (Comments): Moisture associated skin damage  Present on Admission: Yes  Pressure Injury 03/30/20 Buttocks Left Stage 1 -  Intact skin with non-blanchable redness of a localized area usually over a bony prominence. Moisture associated damage (Active)  03/30/20 0036  Location: Buttocks    Location Orientation: Left  Staging: Stage 1 -  Intact skin with non-blanchable redness of a localized area usually over a bony prominence.  Wound Description (Comments): Moisture associated damage  Present on Admission: Yes    Estimated body mass index is 25.74 kg/m as calculated from the following:   Height as of this encounter: 6' (1.829 m).   Weight as of this encounter: 86.1 kg. Malnutrition Type:  Nutrition Problem: Moderate Malnutrition Etiology: chronic illness(CKD stage IV, inadequate oral intake)   Malnutrition Characteristics:  Signs/Symptoms: moderate fat depletion, moderate muscle depletion   Nutrition Interventions:  Interventions: Tube feeding, Prostat  DVT prophylaxis: lovenox Family Communication:Sone Status is: Inpatient  Remains inpatient appropriate because:Hemodynamically unstable   Dispo: The patient is from: Home              Anticipated d/c is to: Home              Anticipated d/c date is: 2 days              Patient currently is not medically stable to d/c.   Code Status:     Code Status Orders  (From admission, onward)         Start     Ordered   03/23/2020 0350  Do not attempt resuscitation (DNR)  Continuous    Question Answer Comment  In the event of cardiac or respiratory ARREST Do not call a "code blue"   In the event of cardiac or respiratory ARREST Do not perform Intubation, CPR, defibrillation or ACLS   In the event of cardiac or respiratory ARREST Use medication by any route, position, wound care, and other measures to relive pain and suffering. May use oxygen, suction and manual treatment of airway obstruction as needed for comfort.      04/09/2020 0355        Code Status History    Date Active Date Inactive Code Status Order ID Comments User Context   11/12/2019 1825 11/16/2019 2158 DNR [id number]  Max Sane, MD Inpatient   11/12/2019 1825 11/12/2019 1825 DNR [id number]  Max Sane, MD Inpatient   12/29/2018 0012 01/04/2019  1949 DNR [id number]  Lance Coon, MD Inpatient   11/29/2018 1201 12/04/2018 2149 DNR [id number]  Dustin Flock, MD Inpatient   11/28/2018 1800 11/29/2018 1201 Full Code [id number]  Henreitta Leber, MD Inpatient   Advance Care Planning Activity        IV Access:    Peripheral IV   Procedures and diagnostic studies:   CT HEAD WO CONTRAST  Result Date: 04/04/2020 CLINICAL DATA:  Encephalopathy. EXAM: CT HEAD WITHOUT CONTRAST TECHNIQUE: Contiguous axial images were obtained from the base of the skull through the vertex without intravenous contrast. COMPARISON:  Head CT 12/30/2018, 11/12/2019, 03/30/2020 FINDINGS: Brain: There is no mass, hemorrhage or extra-axial collection. The size and configuration of the ventricles and extra-axial CSF spaces are normal. There is hypoattenuation of the white matter, most commonly indicating chronic small vessel disease. Expected evolution of left basal ganglia subacute infarct. Chronic right convexity subdural hematoma has resolved. Vascular: No abnormal hyperdensity of the major intracranial arteries or dural venous sinuses. No intracranial atherosclerosis. Skull: The visualized skull base, calvarium and extracranial soft tissues are normal. Sinuses/Orbits: No fluid levels or advanced mucosal thickening of  the visualized paranasal sinuses. No mastoid or middle ear effusion. The orbits are normal. IMPRESSION: 1. No acute intracranial abnormality. 2. Expected evolution of left basal ganglia subacute infarct. 3. Chronic small vessel disease. Electronically Signed   By: Ulyses Jarred M.D.   On: 04/04/2020 15:08     Medical Consultants:    None.  Anti-Infectives:   none  Subjective:    Lafayette Dunlevy Ognibene nonverbal  Objective:    Vitals:   04/04/20 2229 04/05/20 0044 04/05/20 0449 04/05/20 0740  BP: (!) 155/86 (!) 154/70  (!) 144/76  Pulse: 87 69  82  Resp: 14 16  (!) 22  Temp: 97.7 F (36.5 C) 98.4 F (36.9 C)  98.1 F (36.7 C)  TempSrc:  Oral Oral  Axillary  SpO2: 95% 96%  100%  Weight:   86.1 kg   Height:       SpO2: 100 %   Intake/Output Summary (Last 24 hours) at 04/06/2020 1249 Last data filed at 04/06/2020 0500 Gross per 24 hour  Intake 16.82 ml  Output 1450 ml  Net -1433.18 ml   Filed Weights   04/03/20 0500 04/04/20 0600 04/05/20 0449  Weight: 80.4 kg 84.4 kg 86.1 kg    Exam: General exam: In no acute distress. Respiratory system: Good air movement and clear to auscultation, agonal. Cardiovascular system: S1 & S2 heard, RRR. No JVD. Central nervous system: Unresponsive  Data Reviewed:    Labs: Basic Metabolic Panel: Recent Labs  Lab 04/01/20 0413 04/01/20 0413 04/02/20 0457 04/02/20 0457 04/03/20 0426 04/03/20 0426 04/04/20 0427 04/05/20 1314  NA 144  --  145  --  149*  --  149* 138  K 4.6   < > 4.8   < > 4.1   < > 4.4 3.9  CL 114*  --  117*  --  122*  --  119* 111  CO2 20*  --  19*  --  19*  --  22 19*  GLUCOSE 261*  --  220*  --  129*  --  214* 201*  BUN 70*  --  89*  --  94*  --  98* 107*  CREATININE 2.81*  --  3.10*  --  2.79*  --  2.81* 2.64*  CALCIUM 8.6*  --  8.6*  --  8.5*  --  8.5* 7.8*  MG 1.7  --   --   --   --   --   --   --   PHOS 3.6  --   --   --   --   --   --   --    < > = values in this interval not displayed.   GFR Estimated Creatinine Clearance: 22 mL/min (A) (by C-G formula based on SCr of 2.64 mg/dL (H)). Liver Function Tests: No results for input(s): AST, ALT, ALKPHOS, BILITOT, PROT, ALBUMIN in the last 168 hours. No results for input(s): LIPASE, AMYLASE in the last 168 hours. No results for input(s): AMMONIA in the last 168 hours. Coagulation profile Recent Labs  Lab 03/31/20 0547 04/01/20 0413 04/02/20 0457  INR 2.7* 2.8* 1.9*   COVID-19 Labs  No results for input(s): DDIMER, FERRITIN, LDH, CRP in the last 72 hours.  Lab Results  Component Value Date   SARSCOV2NAA NEGATIVE 03/24/2020   [place] NEGATIVE 11/12/2019    CBC: Recent Labs  Lab  04/01/20 0413 04/03/20 0426 04/04/20 0427 04/05/20 0537 04/05/20 1314  WBC 13.5* 13.1* 15.7* 17.1* 20.5*  NEUTROABS  --  10.3* 12.3*  --  16.9*  HGB 13.8 12.6* 12.2* 11.4* 12.0*  HCT 41.4 38.8* 37.2* 35.5* 37.6*  MCV 96.5 98.0 97.4 98.3 99.5  PLT 234 220 221 189 187   Cardiac Enzymes: No results for input(s): CKTOTAL, CKMB, CKMBINDEX, TROPONINI in the last 168 hours. BNP (last 3 results) No results for input(s): PROBNP in the last 8760 hours. CBG: Recent Labs  Lab 04/04/20 2048 04/05/20 0045 04/05/20 0433 04/05/20 0740 04/05/20 1140  GLUCAP 199* 213* 181* 184* 192*   D-Dimer: No results for input(s): DDIMER in the last 72 hours. Hgb A1c: No results for input(s): HGBA1C in the last 72 hours. Lipid Profile: No results for input(s): CHOL, HDL, LDLCALC, TRIG, CHOLHDL, LDLDIRECT in the last 72 hours. Thyroid function studies: No results for input(s): TSH, T4TOTAL, T3FREE, THYROIDAB in the last 72 hours.  Invalid input(s): FREET3 Anemia work up: No results for input(s): VITAMINB12, FOLATE, FERRITIN, TIBC, IRON, RETICCTPCT in the last 72 hours. Sepsis Labs: Recent Labs  Lab 04/03/20 0426 04/04/20 0427 04/05/20 0537 04/05/20 1314  WBC 13.1* 15.7* 17.1* 20.5*   Microbiology Recent Results (from the past 240 hour(s))  Respiratory Panel by RT PCR (Flu A&B, Covid) - Nasopharyngeal Swab     Status: None   Collection Time: 03/19/2020 12:52 AM   Specimen: Nasopharyngeal Swab  Result Value Ref Range Status   SARS Coronavirus 2 by RT PCR NEGATIVE NEGATIVE Final    Comment: (NOTE) SARS-CoV-2 target nucleic acids are NOT DETECTED. The SARS-CoV-2 RNA is generally detectable in upper respiratoy specimens during the acute phase of infection. The lowest concentration of SARS-CoV-2 viral copies this assay can detect is 131 copies/mL. A negative result does not preclude SARS-Cov-2 infection and should not be used as the sole basis for treatment or other patient management  decisions. A negative result may occur with  improper specimen collection/handling, submission of specimen other than nasopharyngeal swab, presence of viral mutation(s) within the areas targeted by this assay, and inadequate number of viral copies (<131 copies/mL). A negative result must be combined with clinical observations, patient history, and epidemiological information. The expected result is Negative. Fact Sheet for Patients:  PinkCheek.be Fact Sheet for Healthcare Providers:  GravelBags.it This test is not yet ap proved or cleared by the [place] FDA and  has been authorized for detection and/or diagnosis of SARS-CoV-2 by FDA under an Emergency Use Authorization (EUA). This EUA will remain  in effect (meaning this test can be used) for the duration of the COVID-19 declaration under Section 564(b)(1) of the Act, 21 U.S.C. section 360bbb-3(b)(1), unless the authorization is terminated or revoked sooner.    Influenza A by PCR NEGATIVE NEGATIVE Final   Influenza B by PCR NEGATIVE NEGATIVE Final    Comment: (NOTE) The Xpert Xpress SARS-CoV-2/FLU/RSV assay is intended as an aid in  the diagnosis of influenza from Nasopharyngeal swab specimens and  should not be used as a sole basis for treatment. Nasal washings and  aspirates are unacceptable for Xpert Xpress SARS-CoV-2/FLU/RSV  testing. Fact Sheet for Patients: PinkCheek.be Fact Sheet for Healthcare Providers: GravelBags.it This test is not yet approved or cleared by the [place] FDA and  has been authorized for detection and/or diagnosis of SARS-CoV-2 by  FDA under an Emergency Use Authorization (EUA). This EUA will remain  in effect (meaning this test can be used) for the duration of the  Covid-19 declaration under Section 564(b)(1) of the Act, 21  U.S.C. section 360bbb-3(b)(1), unless the authorization  is  terminated or revoked. Performed at [facility], [place], [place] [address]   C Difficile Quick Screen w PCR reflex     Status: None   Collection Time: 03/31/20 10:55 AM   Specimen: STOOL  Result Value Ref Range Status   C Diff antigen NEGATIVE NEGATIVE Final   C Diff toxin NEGATIVE NEGATIVE Final   C Diff interpretation No C. difficile detected.  Final    Comment: Performed at [facility], [address] [place], [place] [address]  Urine Culture     Status: Abnormal   Collection Time: 04/03/20  4:17 PM   Specimen: Urine, Random  Result Value Ref Range Status   Specimen Description   Final    URINE, RANDOM Performed at [facility], [address] [place], [place] [address]    Special Requests   Final    NONE Performed at [facility], [address] [place],  [address]    Culture >=100,000 COLONIES/mL ESCHERICHIA COLI (A)  Final   Report Status 04/06/2020 FINAL  Final   Organism ID, Bacteria ESCHERICHIA COLI (A)  Final      Susceptibility   Escherichia coli - MIC*    AMPICILLIN <=2 SENSITIVE Sensitive     CEFAZOLIN <=4 SENSITIVE Sensitive     CEFTRIAXONE <=0.25 SENSITIVE Sensitive     CIPROFLOXACIN <=0.25 SENSITIVE Sensitive     GENTAMICIN <=1 SENSITIVE Sensitive     IMIPENEM <=0.25 SENSITIVE Sensitive     NITROFURANTOIN <=16 SENSITIVE Sensitive     TRIMETH/SULFA <=20 SENSITIVE Sensitive     AMPICILLIN/SULBACTAM <=2 SENSITIVE Sensitive     PIP/TAZO <=4 SENSITIVE Sensitive     * >=100,000 COLONIES/mL ESCHERICHIA COLI     Medications:   .  stroke: mapping our early stages of recovery book   Does not apply Once   Continuous Infusions: . chlorproMAZINE (THORAZINE) IV    . morphine 1 mg/hr (04/05/20 1406)      LOS: 7 days   Charlynne Cousins  [facility]  04/06/2020, 12:49 PM

## 2020-04-06 NOTE — Progress Notes (Signed)
SLP Cancellation Note  Patient Details Name: MICHEIL KLAUS MRN: [id number] DOB: Jun 06, 1931   Cancelled treatment:       Reason Eval/Treat Not Completed: (chart reviewed). Noted pt's focus of care has shifted to Comfort care w/ Discharge to the [facility] [place] when a bed is available. Noted a significant decline in medical status per MD notes. NSg to reconsult if any new needs arise during this admission.     Orinda Kenner, MS, CCC-SLP [facility] [place] 04/06/2020, 4:10 PM

## 2020-04-07 DIAGNOSIS — N184 Chronic kidney disease, stage 4 (severe): Secondary | ICD-10-CM | POA: Diagnosis not present

## 2020-04-07 DIAGNOSIS — Z7901 Long term (current) use of anticoagulants: Secondary | ICD-10-CM

## 2020-04-07 DIAGNOSIS — I10 Essential (primary) hypertension: Secondary | ICD-10-CM | POA: Diagnosis not present

## 2020-04-07 DIAGNOSIS — N319 Neuromuscular dysfunction of bladder, unspecified: Secondary | ICD-10-CM

## 2020-04-07 MED ORDER — MORPHINE 100MG IN NS 100ML (1MG/ML) PREMIX INFUSION: 2.0000 mg/h | INTRAVENOUS | Status: AC

## 2020-04-15 NOTE — Progress Notes (Addendum)
[facility]) Hospital Liaison RN note for [place] is able to offer a room today. Pt's son is to sign consents at 2 pm. Once consents completed we will arrange for EMS pickup at 8 pm.  Notified Dr. Olevia Bowens, bedside RN [facility] Jhonnie Garner. Per Dr. Olevia Bowens, IV morphine gtt will be discontinued prior to transport.  Please fax discharge summary to [phone] when complete.  RN please call report to [phone].  Please call with any hospice related questions or concerns.  Thank you. Margaretmary Eddy, BSN, RN [facility] Liaison [phone]

## 2020-04-15 NOTE — Progress Notes (Signed)
Wasted the rest of patient's Morphine drip with Anderson Malta, Therapist, sports. Charted it and we both signed on yellow paper from pharmacy in patient's chart.

## 2020-04-15 NOTE — TOC Transition Note (Signed)
Transition of Care [facility]) - CM/SW Discharge Note   Patient Details  Name: [name] MRN: [id number] Date of Birth: 28-Apr-1931  [facility]) CM/SW Contact:  Shelbie Ammons, RN Phone Number: Apr 30, 2020, 1:24 PM   Clinical Narrative:   Patient to discharge to hospice home tonight at 8pm per Margaretmary Eddy, EMS paperwork completed, DNR signed and on chart.     Final next level of care: [place] Barriers to Discharge: Continued Medical Work up   Patient Goals and CMS Choice Patient states their goals for this hospitalization and ongoing recovery are:: go home      Discharge Placement                       Discharge Plan and Services   Discharge Planning Services: CM Consult            DME Arranged: N/A         HH Arranged: RN, PT, OT, Nurse's Aide, Social Work [employer] Agency: [place] ([place]) Date [place]: 03/30/20 Time HH Agency Contacted: 1500 Representative spoke with at [place]: [place] ([place]) Interventions     Readmission Risk Interventions Readmission Risk Prevention Plan 04/05/2020  Transportation Screening Complete  Medication Review Press photographer) Referral to Pharmacy  [place] or Home Care Consult Complete  Palliative Care Screening Complete  Skilled Nursing Facility Complete  Some recent data might be hidden

## 2020-04-15 NOTE — Progress Notes (Signed)
[facility] PROGRESS NOTE    Progress Note  Richard [name]  NLZ:[id number] DOB: 10/31/1931 DOA: 03/31/2020 PCP: [name], MD     Brief Narrative:   Richard [name] is an 86 y.o. male past medical history significant for paroxysmal atrial fibrillation on chronic Coumadin, chronic kidney disease stage IV, factor V Leyden deficiency, with a history of BKA, uncontrolled diabetes mellitus, hypertension and CVA with neurogenic bladder who self catheterize comes into the ED with sudden onset of severe right-sided weakness and facial droop with slurred speech  Assessment/Plan:   Acute CVA (cerebrovascular accident) [facility]) Patient appears comfortable on IV morphine, family was comfortable with decision noted to move towards comfort care. They would like the patient to pass away in house. The patient is agonal and mottling he is also third spacing so he probably has hours to days continue to morphine drip and try to keep him as comfortable as we can, patient will probably pass away in house.  Dysphagia/moderate protein caloric malnutrition: Going towards comfort care Hypovolemic hypernatremia: PAF (paroxysmal atrial fibrillation) (HCC) UTI: Worsening leukocytosis: Acute kidney injury on chronic kidney disease stage IV: Essential HTN (hypertension) Type 2 diabetes mellitus with diabetic peripheral angiopathy without gangrene (HCC) Neurogenic bladder; Goals of care/ethics:   Sacral decubitus ulcer stage II present on admission RN Pressure Injury Documentation: Pressure Injury 11/28/18 Stage II -  Partial thickness loss of dermis presenting as a shallow open ulcer with a red, pink wound bed without slough. this is a partial thickness lesion of unknown etiology, NOT a pressure injury (Active)  11/28/18 1858  Location: Buttocks  Location Orientation: Right  Staging: Stage II -  Partial thickness loss of dermis presenting as a shallow open ulcer with a red, pink wound bed without  slough.  Wound Description (Comments): this is a partial thickness lesion of unknown etiology, NOT a pressure injury  Present on Admission: Yes     Pressure Injury 12/29/18 Unstageable - Full thickness tissue loss in which the base of the ulcer is covered by slough (yellow, tan, gray, green or brown) and/or eschar (tan, brown or black) in the wound bed. dry crusted healing pressure ulcer (Active)  12/29/18 0054  Location: Buttocks  Location Orientation: Right;Left  Staging: Unstageable - Full thickness tissue loss in which the base of the ulcer is covered by slough (yellow, tan, gray, green or brown) and/or eschar (tan, brown or black) in the wound bed.  Wound Description (Comments): dry crusted healing pressure ulcer  Present on Admission: Yes     Pressure Injury 12/29/18 Unstageable - Full thickness tissue loss in which the base of the ulcer is covered by slough (yellow, tan, gray, green or brown) and/or eschar (tan, brown or black) in the wound bed. (Active)  12/29/18 0054  Location: Buttocks  Location Orientation: Left  Staging: Unstageable - Full thickness tissue loss in which the base of the ulcer is covered by slough (yellow, tan, gray, green or brown) and/or eschar (tan, brown or black) in the wound bed.  Wound Description (Comments):   Present on Admission: Yes     Pressure Injury Stage 1 -  Intact skin with non-blanchable redness of a localized area usually over a bony prominence. Moisture associated skin damage (Active)     Location:   Location Orientation:   Staging: Stage 1 -  Intact skin with non-blanchable redness of a localized area usually over a bony prominence.  Wound Description (Comments): Moisture associated skin damage  Present on Admission: Yes  Pressure Injury 03/30/20 Buttocks Left Stage 1 -  Intact skin with non-blanchable redness of a localized area usually over a bony prominence. Moisture associated damage (Active)  03/30/20 0036  Location: Buttocks    Location Orientation: Left  Staging: Stage 1 -  Intact skin with non-blanchable redness of a localized area usually over a bony prominence.  Wound Description (Comments): Moisture associated damage  Present on Admission: Yes    Estimated body mass index is 25.74 kg/m as calculated from the following:   Height as of this encounter: 6' (1.829 m).   Weight as of this encounter: 86.1 kg. Malnutrition Type:  Nutrition Problem: Moderate Malnutrition Etiology: chronic illness(CKD stage IV, inadequate oral intake)   Malnutrition Characteristics:  Signs/Symptoms: moderate fat depletion, moderate muscle depletion   Nutrition Interventions:  Interventions: Tube feeding, Prostat  DVT prophylaxis: lovenox Family Communication:Sone Status is: Inpatient  Remains inpatient appropriate because:Hemodynamically unstable   Dispo: The patient is from: Home              Anticipated d/c is to: Home              Anticipated d/c date is: 2 days              Patient currently is not medically stable to d/c.   Code Status:     Code Status Orders  (From admission, onward)         Start     Ordered   04/05/2020 0350  Do not attempt resuscitation (DNR)  Continuous    Question Answer Comment  In the event of cardiac or respiratory ARREST Do not call a "code blue"   In the event of cardiac or respiratory ARREST Do not perform Intubation, CPR, defibrillation or ACLS   In the event of cardiac or respiratory ARREST Use medication by any route, position, wound care, and other measures to relive pain and suffering. May use oxygen, suction and manual treatment of airway obstruction as needed for comfort.      04/10/2020 0355        Code Status History    Date Active Date Inactive Code Status Order ID Comments User Context   11/12/2019 1825 11/16/2019 2158 DNR [id number]  Max Sane, MD Inpatient   11/12/2019 1825 11/12/2019 1825 DNR [id number]  Max Sane, MD Inpatient   12/29/2018 0012 01/04/2019  1949 DNR [id number]  Lance Coon, MD Inpatient   11/29/2018 1201 12/04/2018 2149 DNR [id number]  Dustin Flock, MD Inpatient   11/28/2018 1800 11/29/2018 1201 Full Code [id number]  Henreitta Leber, MD Inpatient   Advance Care Planning Activity        IV Access:    Peripheral IV   Procedures and diagnostic studies:   No results found.   Medical Consultants:    None.  Anti-Infectives:   none  Subjective:    Richard [name] nonverbal  Objective:    Vitals:   04/04/20 2229 04/05/20 0044 04/05/20 0449 04/05/20 0740  BP: (!) 155/86 (!) 154/70  (!) 144/76  Pulse: 87 69  82  Resp: 14 16  (!) 22  Temp: 97.7 F (36.5 C) 98.4 F (36.9 C)  98.1 F (36.7 C)  TempSrc: Oral Oral  Axillary  SpO2: 95% 96%  100%  Weight:   86.1 kg   Height:       SpO2: 100 %   Intake/Output Summary (Last 24 hours) at 04-11-20 0950 Last data filed at 04/11/2020 0400 Gross per 24 hour  Intake 10  ml  Output --  Net 10 ml   Filed Weights   04/03/20 0500 04/04/20 0600 04/05/20 0449  Weight: 80.4 kg 84.4 kg 86.1 kg    Exam: General exam: In no acute distress. Respiratory system: Good air movement and clear to auscultation, agonal. Cardiovascular system: S1 & S2 heard, RRR. No JVD. Central nervous system: Unresponsive  Data Reviewed:    Labs: Basic Metabolic Panel: Recent Labs  Lab 04/01/20 0413 04/01/20 0413 04/02/20 0457 04/02/20 0457 04/03/20 0426 04/03/20 0426 04/04/20 0427 04/05/20 1314  NA 144  --  145  --  149*  --  149* 138  K 4.6   < > 4.8   < > 4.1   < > 4.4 3.9  CL 114*  --  117*  --  122*  --  119* 111  CO2 20*  --  19*  --  19*  --  22 19*  GLUCOSE 261*  --  220*  --  129*  --  214* 201*  BUN 70*  --  89*  --  94*  --  98* 107*  CREATININE 2.81*  --  3.10*  --  2.79*  --  2.81* 2.64*  CALCIUM 8.6*  --  8.6*  --  8.5*  --  8.5* 7.8*  MG 1.7  --   --   --   --   --   --   --   PHOS 3.6  --   --   --   --   --   --   --    < > = values in this  interval not displayed.   GFR Estimated Creatinine Clearance: 22 mL/min (A) (by C-G formula based on SCr of 2.64 mg/dL (H)). Liver Function Tests: No results for input(s): AST, ALT, ALKPHOS, BILITOT, PROT, ALBUMIN in the last 168 hours. No results for input(s): LIPASE, AMYLASE in the last 168 hours. No results for input(s): AMMONIA in the last 168 hours. Coagulation profile Recent Labs  Lab 04/01/20 0413 04/02/20 0457  INR 2.8* 1.9*   COVID-19 Labs  No results for input(s): DDIMER, FERRITIN, LDH, CRP in the last 72 hours.  Lab Results  Component Value Date   [place] NEGATIVE 03/28/2020   [place] NEGATIVE 11/12/2019    CBC: Recent Labs  Lab 04/01/20 0413 04/03/20 0426 04/04/20 0427 04/05/20 0537 04/05/20 1314  WBC 13.5* 13.1* 15.7* 17.1* 20.5*  NEUTROABS  --  10.3* 12.3*  --  16.9*  HGB 13.8 12.6* 12.2* 11.4* 12.0*  HCT 41.4 38.8* 37.2* 35.5* 37.6*  MCV 96.5 98.0 97.4 98.3 99.5  PLT 234 220 221 189 187   Cardiac Enzymes: No results for input(s): CKTOTAL, CKMB, CKMBINDEX, TROPONINI in the last 168 hours. BNP (last 3 results) No results for input(s): PROBNP in the last 8760 hours. CBG: Recent Labs  Lab 04/04/20 2048 04/05/20 0045 04/05/20 0433 04/05/20 0740 04/05/20 1140  GLUCAP 199* 213* 181* 184* 192*   D-Dimer: No results for input(s): DDIMER in the last 72 hours. Hgb A1c: No results for input(s): HGBA1C in the last 72 hours. Lipid Profile: No results for input(s): CHOL, HDL, LDLCALC, TRIG, CHOLHDL, LDLDIRECT in the last 72 hours. Thyroid function studies: No results for input(s): TSH, T4TOTAL, T3FREE, THYROIDAB in the last 72 hours.  Invalid input(s): FREET3 Anemia work up: No results for input(s): VITAMINB12, FOLATE, FERRITIN, TIBC, IRON, RETICCTPCT in the last 72 hours. Sepsis Labs: Recent Labs  Lab 04/03/20 0426 04/04/20 0427 04/05/20 0537 04/05/20 1314  WBC 13.1* 15.7* 17.1* 20.5*   Microbiology Recent Results (from the past 240  hour(s))  Respiratory Panel by RT PCR (Flu A&B, Covid) - Nasopharyngeal Swab     Status: None   Collection Time: 03/31/2020 12:52 AM   Specimen: Nasopharyngeal Swab  Result Value Ref Range Status   SARS Coronavirus 2 by RT PCR NEGATIVE NEGATIVE Final    Comment: (NOTE) SARS-CoV-2 target nucleic acids are NOT DETECTED. The SARS-CoV-2 RNA is generally detectable in upper respiratoy specimens during the acute phase of infection. The lowest concentration of SARS-CoV-2 viral copies this assay can detect is 131 copies/mL. A negative result does not preclude SARS-Cov-2 infection and should not be used as the sole basis for treatment or other patient management decisions. A negative result may occur with  improper specimen collection/handling, submission of specimen other than nasopharyngeal swab, presence of viral mutation(s) within the areas targeted by this assay, and inadequate number of viral copies (<131 copies/mL). A negative result must be combined with clinical observations, patient history, and epidemiological information. The expected result is Negative. Fact Sheet for Patients:  PinkCheek.be Fact Sheet for Healthcare Providers:  GravelBags.it This test is not yet ap proved or cleared by the [place] FDA and  has been authorized for detection and/or diagnosis of SARS-CoV-2 by FDA under an Emergency Use Authorization (EUA). This EUA will remain  in effect (meaning this test can be used) for the duration of the COVID-19 declaration under Section 564(b)(1) of the Act, 21 U.S.C. section 360bbb-3(b)(1), unless the authorization is terminated or revoked sooner.    Influenza A by PCR NEGATIVE NEGATIVE Final   Influenza B by PCR NEGATIVE NEGATIVE Final    Comment: (NOTE) The Xpert Xpress SARS-CoV-2/FLU/RSV assay is intended as an aid in  the diagnosis of influenza from Nasopharyngeal swab specimens and  should not be used as  a sole basis for treatment. Nasal washings and  aspirates are unacceptable for Xpert Xpress SARS-CoV-2/FLU/RSV  testing. Fact Sheet for Patients: PinkCheek.be Fact Sheet for Healthcare Providers: GravelBags.it This test is not yet approved or cleared by the [place] FDA and  has been authorized for detection and/or diagnosis of SARS-CoV-2 by  FDA under an Emergency Use Authorization (EUA). This EUA will remain  in effect (meaning this test can be used) for the duration of the  Covid-19 declaration under Section 564(b)(1) of the Act, 21  U.S.C. section 360bbb-3(b)(1), unless the authorization is  terminated or revoked. Performed at [facility], [place], [place] [address]   C Difficile Quick Screen w PCR reflex     Status: None   Collection Time: 03/31/20 10:55 AM   Specimen: STOOL  Result Value Ref Range Status   C Diff antigen NEGATIVE NEGATIVE Final   C Diff toxin NEGATIVE NEGATIVE Final   C Diff interpretation No C. difficile detected.  Final    Comment: Performed at [facility], [address], [place], [place] [address]  Urine Culture     Status: Abnormal   Collection Time: 04/03/20  4:17 PM   Specimen: Urine, Random  Result Value Ref Range Status   Specimen Description   Final    URINE, RANDOM Performed at [facility], [address], [place], [place] [address]    Special Requests   Final    NONE Performed at [facility], [address] [place], [place] [address]    Culture >=100,000 COLONIES/mL ESCHERICHIA COLI (A)  Final   Report Status  04/06/2020 FINAL  Final   Organism ID, Bacteria ESCHERICHIA COLI (A)  Final      Susceptibility   Escherichia coli - MIC*    AMPICILLIN <=2 SENSITIVE Sensitive     CEFAZOLIN <=4 SENSITIVE Sensitive     CEFTRIAXONE <=0.25 SENSITIVE Sensitive     CIPROFLOXACIN <=0.25 SENSITIVE Sensitive     GENTAMICIN <=1  SENSITIVE Sensitive     IMIPENEM <=0.25 SENSITIVE Sensitive     NITROFURANTOIN <=16 SENSITIVE Sensitive     TRIMETH/SULFA <=20 SENSITIVE Sensitive     AMPICILLIN/SULBACTAM <=2 SENSITIVE Sensitive     PIP/TAZO <=4 SENSITIVE Sensitive     * >=100,000 COLONIES/mL ESCHERICHIA COLI     Medications:   .  stroke: mapping our early stages of recovery book   Does not apply Once   Continuous Infusions: . chlorproMAZINE (THORAZINE) IV    . morphine 1 mg/hr (04/05/20 1406)      LOS: 8 days   Charlynne Cousins  [facility]  04-11-2020, 9:50 AM

## 2020-04-15 NOTE — Discharge Summary (Addendum)
Physician Discharge Summary  Richard [name] VZD:[id number] DOB: 08/03/1931 DOA: 04/13/2020  PCP: [name], MD  Admit date: 04/03/2020 Discharge date: 04/25/2020  Admitted From: Home Disposition: Residential hospice   Recommendations for Outpatient Follow-up:  Patient will go to residential hospice and for comfort care.  Home Health:No Equipment/Devices:None  Discharge Condition:Hospice CODE STATUS:DNR Diet recommendation: Heart Healthy    Brief/Interim Summary: 86 y.o. male past medical history significant for paroxysmal atrial fibrillation on chronic Coumadin, chronic kidney disease stage IV, factor V Leyden deficiency, with a history of BKA, uncontrolled diabetes mellitus, hypertension and CVA with neurogenic bladder who self catheterize comes into the ED with sudden onset of severe right-sided weakness and facial droop with slurred speech   Discharge Diagnoses:  Principal Problem:   Acute CVA (cerebrovascular accident) ([place]) Active Problems:   PAF (paroxysmal atrial fibrillation) (HCC)   HTN (hypertension)   CKD (chronic kidney disease) stage 4, GFR 15-29 ml/min (HCC)   Hemiparesis affecting right side as late effect of cerebrovascular accident (CVA) ([place])   Type 2 diabetes mellitus with diabetic peripheral angiopathy without gangrene (HCC)   Neurogenic bladder   Chronic anticoagulation   Pressure injury of buttock, stage 1, left   Malnutrition of moderate degree  Acute CVA: Hemoglobin A1c was 6.7 fasting lipid panel showed an HDL less than 40 LDL greater than 100 MRI showed new acute infarct. As the patient has been deteriorating over the last several months, and is not being his first CVA for the last 12 months with multiple comorbidities after further evaluation and discussion with the family they decided to move towards comfort care. He was started on IV morphine all labs were stopped and all medications that are now related to comfort were started. He passed away  in the hospital on Apr 25, 2020 at 1:22 PM.  Dysphagia/moderate protein caloric malnutrition: Going towards comfort care Hypovolemic hypernatremia: PAF (paroxysmal atrial fibrillation) ([place]) UTI: Worsening leukocytosis: Acute kidney injury on chronic kidney disease stage IV: Essential HTN (hypertension) Type 2 diabetes mellitus with diabetic peripheral angiopathy without gangrene ([employer]) Neurogenic bladder;  Discharge Instructions   Allergies as of 04-25-20   No Known Allergies     Medication List    STOP taking these medications   Alpha-Lipoic Acid 200 MG Caps   aspirin 81 MG chewable tablet   atorvastatin 40 MG tablet Commonly known as: LIPITOR   CALCIUM 600 PO   Coenzyme Q10 10 MG capsule   colchicine 0.6 MG tablet   glimepiride 4 MG tablet Commonly known as: AMARYL   lisinopril 10 MG tablet Commonly known as: ZESTRIL   metoprolol tartrate 25 MG tablet Commonly known as: LOPRESSOR   MULTIVITAMIN ADULT EXTRA C PO   Red Yeast Rice 600 MG Tabs   senna-docusate 8.6-50 MG tablet Commonly known as: Senokot-S   Uloric 40 MG tablet Generic drug: febuxostat   Vitamin A & D [address] units Tabs   Vitamin D 50 MCG (2000 UT) tablet   warfarin 5 MG tablet Commonly known as: COUMADIN     TAKE these medications   morphine 1 mg/mL Soln infusion Inject 2 mg/hr into the vein continuous.      Contact information for after-discharge care    [facility] .   Service: Skilled Nursing Contact information: [place] [phone]             No Known Allergies  Consultations:  Neurology  Nephrology   Procedures/Studies:  DG Abd 1 View  Result Date: 03/31/2020 CLINICAL DATA:  Nasogastric tube placement EXAM: ABDOMEN - 1 VIEW COMPARISON:  Earlier today FINDINGS: The enteric tube tip is at the stomach. Visualized bowel gas pattern is normal, with oral contrast seen in the  proximal colon. Reticulation at the lung bases without asymmetric density. IMPRESSION: The enteric tube tip is at the stomach. Electronically Signed   By: Monte Fantasia M.D.   On: 03/31/2020 05:45   DG Abd 1 View  Result Date: 03/31/2020 CLINICAL DATA:  NG tube placement EXAM: ABDOMEN - 1 VIEW COMPARISON:  03/30/2020 FINDINGS: Esophageal tube is folded upon itself over the gastric fundus. Mild residual contrast within the bowel. IMPRESSION: Enteric tube is looped back upon itself over the gastric fundus Electronically Signed   By: Donavan Foil M.D.   On: 03/31/2020 00:38   CT HEAD WO CONTRAST  Result Date: 04/04/2020 CLINICAL DATA:  Encephalopathy. EXAM: CT HEAD WITHOUT CONTRAST TECHNIQUE: Contiguous axial images were obtained from the base of the skull through the vertex without intravenous contrast. COMPARISON:  Head CT 12/30/2018, 11/12/2019, 03/30/2020 FINDINGS: Brain: There is no mass, hemorrhage or extra-axial collection. The size and configuration of the ventricles and extra-axial CSF spaces are normal. There is hypoattenuation of the white matter, most commonly indicating chronic small vessel disease. Expected evolution of left basal ganglia subacute infarct. Chronic right convexity subdural hematoma has resolved. Vascular: No abnormal hyperdensity of the major intracranial arteries or dural venous sinuses. No intracranial atherosclerosis. Skull: The visualized skull base, calvarium and extracranial soft tissues are normal. Sinuses/Orbits: No fluid levels or advanced mucosal thickening of the visualized paranasal sinuses. No mastoid or middle ear effusion. The orbits are normal. IMPRESSION: 1. No acute intracranial abnormality. 2. Expected evolution of left basal ganglia subacute infarct. 3. Chronic small vessel disease. Electronically Signed   By: Ulyses Jarred M.D.   On: 04/04/2020 15:08   CT HEAD WO CONTRAST  Result Date: 03/30/2020 CLINICAL DATA:  Stroke follow-up EXAM: CT HEAD WITHOUT  CONTRAST TECHNIQUE: Contiguous axial images were obtained from the base of the skull through the vertex without intravenous contrast. COMPARISON:  Yesterday FINDINGS: Brain: Patient's left-sided subcortical infarct is subtle compared to prior brain MRI. No hemorrhagic conversion or evidence of progression. Generalized cerebral volume loss in keeping with age. No hydrocephalus or masslike finding. Vascular: Atherosclerosis and tortuosity of intracranial vessels. Skull: Normal. Negative for fracture or focal lesion. Sinuses/Orbits: No acute finding IMPRESSION: No hemorrhagic conversion or evidence of infarct progression. Electronically Signed   By: Monte Fantasia M.D.   On: 03/30/2020 06:50   CT HEAD WO CONTRAST  Result Date: 04/06/2020 CLINICAL DATA:  Right-sided weakness with facial droop EXAM: CT HEAD WITHOUT CONTRAST TECHNIQUE: Contiguous axial images were obtained from the base of the skull through the vertex without intravenous contrast. COMPARISON:  MRI 11/12/2019, CT brain 11/12/2019 FINDINGS: Brain: No acute territorial infarction, hemorrhage or intracranial mass. Mild atrophy and chronic small vessel ischemic change of the white matter. Stable ventricle size. Vascular: No hyperdense vessels.  Carotid vascular calcification. Skull: Normal. Negative for fracture or focal lesion. Sinuses/Orbits: No acute finding. Other: None IMPRESSION: 1. No CT evidence for acute intracranial abnormality. 2. Atrophy and chronic small vessel ischemic change of the white matter Electronically Signed   By: Donavan Foil M.D.   On: 04/06/2020 01:44   MR BRAIN WO CONTRAST  Result Date: 03/20/2020 CLINICAL DATA:  Acute onset of right-sided weakness with facial droop that has nearly completely resolved EXAM:  MRI HEAD WITHOUT CONTRAST TECHNIQUE: Multiplanar, multiecho pulse sequences of the brain and surrounding structures were obtained without intravenous contrast. COMPARISON:  Head CT from earlier today. FINDINGS: Brain:  Elongated patchy restricted diffusion at the left putamen and corona radiata. Chronic blood products along the right cerebral convexity. There is subtle subdural collection/dural thickening along the right frontal parietal convexity which is not dense by CT, 2 mm in maximal thickness. This does not follow CSF on FLAIR imaging. No hydrocephalus or masslike finding. Vascular: Preserved flow voids. Intracranial vessels are tortuous, often attributed to chronic hypertension. Skull and upper cervical spine: Normal marrow signal. Degenerative facet spurring in the upper cervical spine. Sinuses/Orbits: Partial bilateral mastoid opacification. Mucosal thickening in the left sphenoid sinus. Bilateral cataract resection. Other: 3.7 cm suboccipital lipoma in the midline posterior neck. IMPRESSION: 1. Small acute perforator infarction at the left basal ganglia and corona radiata. 2. Thin (2 mm) subdural collection or dural thickening along the right frontal parietal convexity, likely subacute hematoma. Electronically Signed   By: Monte Fantasia M.D.   On: 03/30/2020 06:21   US Carotid Bilateral (at [facility] and [facility] only)  Result Date: 04/08/2020 CLINICAL DATA:  Stroke symptoms, diabetes EXAM: BILATERAL CAROTID DUPLEX ULTRASOUND TECHNIQUE: Pearline Cables scale imaging, color Doppler and duplex ultrasound were performed of bilateral carotid and vertebral arteries in the neck. COMPARISON:  11/13/2019 FINDINGS: Criteria: Quantification of carotid stenosis is based on velocity parameters that correlate the residual internal carotid diameter with NASCET-based stenosis levels, using the diameter of the distal internal carotid lumen as the denominator for stenosis measurement. The following velocity measurements were obtained: RIGHT ICA: 108/21 cm/sec CCA: 509/32 cm/sec SYSTOLIC ICA/CCA RATIO:  0.8 ECA: 136 cm/sec LEFT ICA: 89/12 cm/sec CCA: 67/1 cm/sec SYSTOLIC ICA/CCA RATIO:  0.9 ECA: 86 cm/sec RIGHT CAROTID ARTERY: Moderate carotid  atherosclerosis with echogenic shadowing calcification at the bifurcation. Slight tortuosity noted. Despite this, no hemodynamically significant right ICA stenosis, turbulent flow, or spectral broadening. Degree of stenosis estimated at less than 50%. RIGHT VERTEBRAL ARTERY:  Antegrade LEFT CAROTID ARTERY: Similar moderate atherosclerosis and tortuosity. No hemodynamically significant left ICA stenosis velocity elevation, turbulent flow. Degree of narrowing also less than 50%. LEFT VERTEBRAL ARTERY:  Antegrade IMPRESSION: Moderate bilateral carotid atherosclerosis with no hemodynamically significant ICA stenosis degree of narrowing estimated at less than 50% bilaterally by ultrasound criteria. Overall stable exam. Patent antegrade vertebral flow bilaterally Electronically Signed   By: Jerilynn Mages.  Shick M.D.   On: 03/22/2020 16:16   DG Abd Portable 1V  Result Date: 03/30/2020 CLINICAL DATA:  Nasogastric tube repositioning EXAM: PORTABLE ABDOMEN - 1 VIEW COMPARISON:  None. FINDINGS: Nasogastric tube tip is in the stomach with the side port at the gastroesophageal junction. There is no bowel dilatation or air-fluid level to suggest bowel obstruction. No free air. Lung bases clear. IMPRESSION: Nasogastric tube tip in stomach with side port at gastroesophageal junction. Advise advancing nasogastric tube 4-5 cm. No bowel obstruction or free air. Electronically Signed   By: Lowella Grip III M.D.   On: 03/30/2020 19:08   DG Abd Portable 1V  Result Date: 03/30/2020 CLINICAL DATA:  Nasogastric tube placement EXAM: PORTABLE ABDOMEN - 1 VIEW COMPARISON:  None. FINDINGS: Nasogastric tube tip is at the gastroesophageal junction with the side port in the distal esophagus. There is contrast in distal small bowel. No bowel dilatation or air-fluid level to suggest bowel obstruction. No free air. Lung bases clear. IMPRESSION: Nasogastric tube tip at gastroesophageal junction with side port above the gastroesophageal  junction.  Advise advancing nasogastric tube approximately 7-8 cm to ensure that tip and side port are in the stomach. No bowel obstruction or free air evident. These results will be called to the ordering clinician or representative by the Radiologist Assistant, and communication documented in the PACS or [employer]. Electronically Signed   By: Lowella Grip III M.D.   On: 03/30/2020 18:02   DG Naso G Tube Plc W/Fl W/Rad  Result Date: 04/03/2020 CLINICAL DATA:  NG tube placement. Dysphagia. Nursing staff was unsuccessful at placement of a nasogastric 2 on the floor. EXAM: NASO G TUBE PLACEMENT WITH FL AND WITH RAD FLUOROSCOPY TIME:  Fluoroscopy Time:  0.3 minute Radiation Exposure Index (if provided by the fluoroscopic device): 1.9 mGy Number of Acquired Spot Images: 0 COMPARISON:  None. FINDINGS: A Dobbhoff tube was inserted through the right nare and advanced into the stomach. Real-time fluoroscopy was utilized for confirmation of the Dobbhoff tube placement with the tip in the fundus of the stomach. No complications. IMPRESSION: Successful placement of a Dobbhoff tube with the tip in the stomach. Electronically Signed   By: Kathreen Devoid   On: 04/03/2020 09:06   DG Addison Bailey G Tube Plc W/Fl W/Rad  Result Date: 03/31/2020 CLINICAL DATA:  Evaluate NG tube. EXAM: NASO G TUBE PLACEMENT WITH FL AND WITH RAD CONTRAST:  None. FLUOROSCOPY TIME:  Fluoroscopy Time:  0 minutes 36 second Radiation Exposure Index (if provided by the fluoroscopic device): 0.9 mGy. Number of Acquired Spot Images: 1 COMPARISON:  KUB 03/31/2020. FINDINGS: NG tube tip is in the upper most portion of the stomach. Under fluoroscopic guidance NG tube was advanced. Its tip and side hole are coiled in the stomach. There no complications. IMPRESSION: Successful NG tube repositioning under fluoroscopic guidance. NG tube tip and side hole coiled in stomach. Electronically Signed   By: Marcello Moores  Register   On: 03/31/2020 08:18   DG Swallowing Func-Speech  Pathology  Result Date: 04/05/2020 Objective Swallowing Evaluation: Type of Study: MBS-Modified Barium Swallow Study  Patient Details Name: Richard [name] MRN: [id number] Date of Birth: 1931/05/14 Today's Date: 04/05/2020 Time: SLP Start Time (ACUTE ONLY): 0815 -SLP Stop Time (ACUTE ONLY): 0930 SLP Time Calculation (min) (ACUTE ONLY): 75 min Past Medical History: Past Medical History: Diagnosis Date . Atrial fibrillation ([place])  . Cancer ([place])  . Chronic kidney disease  . Colon polyp  . Diabetes mellitus  . History of blood clotting disorder  . HTN (hypertension)  Past Surgical History: Past Surgical History: Procedure Laterality Date . AMPUTATION TOE Right 11/29/2018  Procedure: AMPUTATION TOE;  Surgeon: Albertine Patricia, DPM;  Location: [facility] ORS;  Service: Podiatry;  Laterality: Right; . EYE SURGERY    CATARACTS . HERNIA REPAIR   . LOWER EXTREMITY ANGIOGRAPHY Right 12/02/2018  Procedure: Lower Extremity Angiography;  Surgeon: Algernon Huxley, MD;  Location: [facility];  Service: Cardiovascular;  Laterality: Right; . SKIN CANCER EXCISION   HPI: Pt is a 86 y.o. male with medical history significant for "3 CVAs" in past, per pt report, paroxysmal A. 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  EMS was called after 2 hours of symptom onset and patient reportedly refused to be taken to [facility] for the possibility of LVO, insisting on being brought to [facility] instead.  ED course: By arrival to the hospital however, his symptoms had reportedly for the most part resolved with persistent weakness of  right upper extremity weakness beyond his usual baseline.  At baseline patient ambulates with a cane.  MRI: "Small acute perforator infarction at the left basal ganglia and corona radiata".  Subjective: pt awake, in mbss chair. Gave a few verbal responses; Dysarthria noted. Drwosy initially. Assessment / Plan / Recommendation CHL IP CLINICAL IMPRESSIONS 03/30/2020 Clinical Impression Pt appears to present w/ Moderate-Severe oropharyngeal phase dysphagia w/ High risk for Aspiration to occur. ANY Aspiration could lead to negative sequelae including Pulmonary decline.  SLP Visit Diagnosis Dysphagia, oropharyngeal phase (R13.12) Attention and concentration deficit following -- Frontal lobe and executive function deficit following -- Impact on safety and function Moderate aspiration risk;Severe aspiration risk;Risk for inadequate nutrition/hydration   CHL IP TREATMENT RECOMMENDATION 03/30/2020 Treatment Recommendations Therapy as outlined in treatment plan below   Prognosis 03/30/2020 Prognosis for Safe Diet Advancement Fair Barriers to Reach Goals Cognitive deficits;Time post onset;Severity of deficits Barriers/Prognosis Comment -- CHL IP DIET RECOMMENDATION 03/30/2020 SLP Diet Recommendations NPO Liquid Administration via (No Data) Medication Administration Via alternative means Compensations Minimize environmental distractions;Slow rate;Small sips/bites;Lingual sweep for clearance of pocketing;Monitor for anterior loss;Multiple dry swallows after each bite/sip;Chin tuck;Effortful swallow Postural Changes Remain semi-upright after after feeds/meals (Comment);Seated upright at 90 degrees   CHL IP OTHER RECOMMENDATIONS 03/30/2020 Recommended Consults (No Data) Oral Care Recommendations Oral care BID;Oral care before and after PO;Staff/trained caregiver to provide oral care Other Recommendations Order thickener from pharmacy;Prohibited food (jello, ice cream, thin soups);Remove water pitcher;Have oral suction available   CHL  IP FOLLOW UP RECOMMENDATIONS 03/30/2020 Follow up Recommendations Skilled Nursing facility   [facility] IP FREQUENCY AND DURATION 03/30/2020 Speech Therapy Frequency (ACUTE ONLY) min 3x week Treatment Duration 2 weeks      CHL IP ORAL PHASE 03/30/2020 Oral Phase Impaired Oral - Pudding Teaspoon -- Oral - Pudding Cup -- Oral - Honey Teaspoon -- Oral - Honey Cup -- Oral - Nectar Teaspoon -- Oral - Nectar Cup -- Oral - Nectar Straw -- Oral - Thin Teaspoon -- Oral - Thin Cup -- Oral - Thin Straw -- Oral - Puree -- Oral - Mech Soft -- Oral - Regular -- Oral - Multi-Consistency -- Oral - Pill -- Oral Phase - Comment pt exhibited Moderate oral weakness w/ reduced lingual and labial weakness; decreased lingual tone w/ drooling on R side. During bolus management of trials given, Moderate decreased lingual strength and coordination resulted in reduced effective and efficient A-P transfer of boluses. Decreased bolus cohesion occurred w/ premature spillage of liquid trials into the pharynx. It was effortful and time consuming for pt to organize boluses then complete A-P transfer; pt was Unable to initiate and complete a Dry, f/u swallow when verbally instructed by this SLP(as part of his strategies). The challenge to the Dry, f/u swallows increased when pt was asked to use a Chin Tuck position when swallowing(impact of Gravity pulling bolus material more anteriorly). R Anterior leakage noted; diffuse oral residue noted.   CHL IP PHARYNGEAL PHASE 03/30/2020 Pharyngeal Phase Impaired Pharyngeal- Pudding Teaspoon -- Pharyngeal -- Pharyngeal- Pudding Cup -- Pharyngeal -- Pharyngeal- Honey Teaspoon -- Pharyngeal -- Pharyngeal- Honey Cup -- Pharyngeal -- Pharyngeal- Nectar Teaspoon -- Pharyngeal -- Pharyngeal- Nectar Cup --  Pharyngeal -- Pharyngeal- Nectar Straw -- Pharyngeal -- Pharyngeal- Thin Teaspoon -- Pharyngeal -- Pharyngeal- Thin Cup -- Pharyngeal -- Pharyngeal- Thin Straw -- Pharyngeal -- Pharyngeal- Puree -- Pharyngeal -- Pharyngeal-  Mechanical Soft -- Pharyngeal -- Pharyngeal- Regular -- Pharyngeal -- Pharyngeal- Multi-consistency -- Pharyngeal -- Pharyngeal- Pill -- Pharyngeal -- Pharyngeal Comment pt exhibited Moderate-Severe delay in pharyngeal swallow initiation moreso w/ thickened liquid trials; min less w/ puree trials. Nectar and Honey consistency liquids VIA TSP appeared to spill to, and/or fill, the Pyriform Sinuses b/f pharyngeal swallow initiation, This resulted in Aspiration (SILENT) of Nectar consistency liquids w/ a much delayed, weak Cough response. TSP trials of the Honey consistency liquids were slightly more timely in triggering the pharyngeal swallow at the level of spilling to the P.S. Moderate pharyngeal residue remained throughout the pharynx but moreso along the BOT and Valleculae; as trials continued and fatigue factor increased, more bolus residue remained along the aeryepiglottic folds and P.S. Use of a Chin Tuck during the swallow appeared to aid reducing of pharyngeal residue, especially Valleculae residue. A Dry, f/u swallow was also successful in reducing pharyngeal residue. Pt required Verbal cues for follow through w/ strategies. Of note, pt was unable to generate a Dry, f/u swallow despite Verbal cues.   CHL IP CERVICAL ESOPHAGEAL PHASE 03/30/2020 Cervical Esophageal Phase WFL Pudding Teaspoon -- Pudding Cup -- Honey Teaspoon -- Honey Cup -- Nectar Teaspoon -- Nectar Cup -- Nectar Straw -- Thin Teaspoon -- Thin Cup -- Thin Straw -- Puree -- Mechanical Soft -- Regular -- Multi-consistency -- Pill -- Cervical Esophageal Comment -- Watson,Katherine 04/05/2020, 9:00 AM              ECHOCARDIOGRAM COMPLETE  Result Date: 04/02/2020    ECHOCARDIOGRAM REPORT   Patient Name:   Richard [name] Date of Exam: 04/06/2020 Medical Rec #:  [id number]        Height:       72.0 in Accession #:    [id number]       Weight:       220.0 lb Date of Birth:  31-Jan-1931        BSA:          2.219 m Patient Age:    59 years         BP:            106/84 mmHg Patient Gender: M                HR:           58 bpm. Exam Location:  [facility] Procedure: 2D Echo, Color Doppler, Cardiac Doppler and Intracardiac            Opacification Agent Indications:     I163.9 Stroke  History:         Patient has prior history of Echocardiogram examinations, most                  recent 11/13/2019. CKD, Arrythmias:Atrial Fibrillation; Risk                  Factors:Hypertension and Diabetes. Cancer.  Sonographer:     Charmayne Sheer RDCS (AE) Referring Phys:  [id number] Athena Masse Diagnosing Phys: Bartholome Bill MD  Sonographer Comments: Suboptimal apical window and suboptimal subcostal window. IMPRESSIONS  1. Left ventricular ejection fraction, by estimation, is 60 to 65%. The left ventricle has normal function. The left ventricle has no regional wall motion abnormalities. There is moderate left ventricular  hypertrophy. Left ventricular diastolic parameters are consistent with Grade I diastolic dysfunction (impaired relaxation).  2. Right ventricular systolic function is normal. The right ventricular size is normal.  3. Left atrial size was mildly dilated.  4. The mitral valve is degenerative. Trivial mitral valve regurgitation.  5. The aortic valve was not well visualized. Aortic valve regurgitation is mild to moderate. FINDINGS  Left Ventricle: Left ventricular ejection fraction, by estimation, is 60 to 65%. The left ventricle has normal function. The left ventricle has no regional wall motion abnormalities. Definity contrast agent was given IV to delineate the left ventricular  endocardial borders. The left ventricular internal cavity size was normal in size. There is moderate left ventricular hypertrophy. Left ventricular diastolic parameters are consistent with Grade I diastolic dysfunction (impaired relaxation). Right Ventricle: The right ventricular size is normal. No increase in right ventricular wall thickness. Right ventricular systolic function is normal. Left Atrium:  Left atrial size was mildly dilated. Right Atrium: Right atrial size was normal in size. Pericardium: There is no evidence of pericardial effusion. Mitral Valve: The mitral valve is degenerative in appearance. Trivial mitral valve regurgitation. MV peak gradient, 3.3 mmHg. The mean mitral valve gradient is 2.0 mmHg. Tricuspid Valve: The tricuspid valve is not well visualized. Tricuspid valve regurgitation is trivial. Aortic Valve: The aortic valve was not well visualized. Aortic valve regurgitation is mild to moderate. Aortic valve mean gradient measures 3.0 mmHg. Aortic valve peak gradient measures 6.0 mmHg. Aortic valve area, by VTI measures 2.13 cm. Pulmonic Valve: The pulmonic valve was not well visualized. Pulmonic valve regurgitation is trivial. Aorta: The aortic root was not well visualized. IAS/Shunts: The interatrial septum was not assessed.  LEFT VENTRICLE PLAX 2D LVIDd:         4.27 cm LVIDs:         2.84 cm LV PW:         1.18 cm LV IVS:        0.89 cm LVOT diam:     2.40 cm LV SV:         60 LV SV Index:   27 LVOT Area:     4.52 cm  LEFT ATRIUM           Index LA diam:      4.40 cm 1.98 cm/m LA Vol (A4C): 62.4 ml 28.13 ml/m  AORTIC VALVE                   PULMONIC VALVE AV Area (Vmax):    2.37 cm    PV Vmax:       0.83 m/s AV Area (Vmean):   2.48 cm    PV Vmean:      50.800 cm/s AV Area (VTI):     2.13 cm    PV VTI:        0.181 m AV Vmax:           122.00 cm/s PV Peak grad:  2.7 mmHg AV Vmean:          78.900 cm/s PV Mean grad:  1.0 mmHg AV VTI:            0.282 m AV Peak Grad:      6.0 mmHg AV Mean Grad:      3.0 mmHg LVOT Vmax:         64.00 cm/s LVOT Vmean:        43.300 cm/s LVOT VTI:          0.133 m  LVOT/AV VTI ratio: 0.47  AORTA Ao Root diam: 3.90 cm MITRAL VALVE MV Area (PHT): 2.53 cm    SHUNTS MV Peak grad:  3.3 mmHg    Systemic VTI:  0.13 m MV Mean grad:  2.0 mmHg    Systemic Diam: 2.40 cm MV Vmax:       0.90 m/s MV Vmean:      57.3 cm/s MV Decel Time: 300 msec MV E velocity: 73.87  cm/s Bartholome Bill MD Electronically signed by Bartholome Bill MD Signature Date/Time: 03/22/2020/10:50:12 AM    Final    Subjective: Nonverbal  Discharge Exam: Vitals:   04/05/20 0044 04/05/20 0740  BP: (!) 154/70 (!) 144/76  Pulse: 69 82  Resp: 16 (!) 22  Temp: 98.4 F (36.9 C) 98.1 F (36.7 C)  SpO2: 96% 100%   Vitals:   04/04/20 2229 04/05/20 0044 04/05/20 0449 04/05/20 0740  BP: (!) 155/86 (!) 154/70  (!) 144/76  Pulse: 87 69  82  Resp: 14 16  (!) 22  Temp: 97.7 F (36.5 C) 98.4 F (36.9 C)  98.1 F (36.7 C)  TempSrc: Oral Oral  Axillary  SpO2: 95% 96%  100%  Weight:   86.1 kg   Height:        General: Pt is alert, awake, not in acute distress Cardiovascular: RRR, S1/S2 +, no rubs, no gallops Respiratory: CTA bilaterally, no wheezing, no rhonchi Abdominal: Soft, NT, ND, bowel sounds + Extremities: no edema, no cyanosis    The results of significant diagnostics from this hospitalization (including imaging, microbiology, ancillary and laboratory) are listed below for reference.     Microbiology: Recent Results (from the past 240 hour(s))  Respiratory Panel by RT PCR (Flu A&B, Covid) - Nasopharyngeal Swab     Status: None   Collection Time: 03/19/2020 12:52 AM   Specimen: Nasopharyngeal Swab  Result Value Ref Range Status   SARS Coronavirus 2 by RT PCR NEGATIVE NEGATIVE Final    Comment: (NOTE) SARS-CoV-2 target nucleic acids are NOT DETECTED. The SARS-CoV-2 RNA is generally detectable in upper respiratoy specimens during the acute phase of infection. The lowest concentration of SARS-CoV-2 viral copies this assay can detect is 131 copies/mL. A negative result does not preclude SARS-Cov-2 infection and should not be used as the sole basis for treatment or other patient management decisions. A negative result may occur with  improper specimen collection/handling, submission of specimen other than nasopharyngeal swab, presence of viral mutation(s) within the areas  targeted by this assay, and inadequate number of viral copies (<131 copies/mL). A negative result must be combined with clinical observations, patient history, and epidemiological information. The expected result is Negative. Fact Sheet for Patients:  PinkCheek.be Fact Sheet for Healthcare Providers:  GravelBags.it This test is not yet ap proved or cleared by the [place] FDA and  has been authorized for detection and/or diagnosis of SARS-CoV-2 by FDA under an Emergency Use Authorization (EUA). This EUA will remain  in effect (meaning this test can be used) for the duration of the COVID-19 declaration under Section 564(b)(1) of the Act, 21 U.S.C. section 360bbb-3(b)(1), unless the authorization is terminated or revoked sooner.    Influenza A by PCR NEGATIVE NEGATIVE Final   Influenza B by PCR NEGATIVE NEGATIVE Final    Comment: (NOTE) The Xpert Xpress SARS-CoV-2/FLU/RSV assay is intended as an aid in  the diagnosis of influenza from Nasopharyngeal swab specimens and  should not be used as a sole basis for treatment. Nasal washings and  aspirates are unacceptable for Xpert Xpress SARS-CoV-2/FLU/RSV  testing. Fact Sheet for Patients: PinkCheek.be Fact Sheet for Healthcare Providers: GravelBags.it This test is not yet approved or cleared by the [place] FDA and  has been authorized for detection and/or diagnosis of SARS-CoV-2 by  FDA under an Emergency Use Authorization (EUA). This EUA will remain  in effect (meaning this test can be used) for the duration of the  Covid-19 declaration under Section 564(b)(1) of the Act, 21  U.S.C. section 360bbb-3(b)(1), unless the authorization is  terminated or revoked. Performed at [facility], [place], [place] [address]   C Difficile Quick Screen w PCR reflex     Status: None   Collection Time:  03/31/20 10:55 AM   Specimen: STOOL  Result Value Ref Range Status   C Diff antigen NEGATIVE NEGATIVE Final   C Diff toxin NEGATIVE NEGATIVE Final   C Diff interpretation No C. difficile detected.  Final    Comment: Performed at [facility], [address] [place], [place] [address]  Urine Culture     Status: Abnormal   Collection Time: 04/03/20  4:17 PM   Specimen: Urine, Random  Result Value Ref Range Status   Specimen Description   Final    URINE, RANDOM Performed at [facility], [address] [place], [place] [address]    Special Requests   Final    NONE Performed at [facility], [place], [place] [address]    Culture >=100,000 COLONIES/mL ESCHERICHIA COLI (A)  Final   Report Status 04/06/2020 FINAL  Final   Organism ID, Bacteria ESCHERICHIA COLI (A)  Final      Susceptibility   Escherichia coli - MIC*    AMPICILLIN <=2 SENSITIVE Sensitive     CEFAZOLIN <=4 SENSITIVE Sensitive     CEFTRIAXONE <=0.25 SENSITIVE Sensitive     CIPROFLOXACIN <=0.25 SENSITIVE Sensitive     GENTAMICIN <=1 SENSITIVE Sensitive     IMIPENEM <=0.25 SENSITIVE Sensitive     NITROFURANTOIN <=16 SENSITIVE Sensitive     TRIMETH/SULFA <=20 SENSITIVE Sensitive     AMPICILLIN/SULBACTAM <=2 SENSITIVE Sensitive     PIP/TAZO <=4 SENSITIVE Sensitive     * >=100,000 COLONIES/mL ESCHERICHIA COLI     Labs: BNP (last 3 results) No results for input(s): BNP in the last 8760 hours. Basic Metabolic Panel: Recent Labs  Lab 04/01/20 0413 04/02/20 0457 04/03/20 0426 04/04/20 0427 04/05/20 1314  NA 144 145 149* 149* 138  K 4.6 4.8 4.1 4.4 3.9  CL 114* 117* 122* 119* 111  CO2 20* 19* 19* 22 19*  GLUCOSE 261* 220* 129* 214* 201*  BUN 70* 89* 94* 98* 107*  CREATININE 2.81* 3.10* 2.79* 2.81* 2.64*  CALCIUM 8.6* 8.6* 8.5* 8.5* 7.8*  MG 1.7  --   --   --   --   PHOS 3.6  --   --   --   --    Liver Function Tests: No results for input(s): AST, ALT, ALKPHOS,  BILITOT, PROT, ALBUMIN in the last 168 hours. No results for input(s): LIPASE, AMYLASE in the last 168 hours. No results for input(s): AMMONIA in the last 168 hours. CBC: Recent Labs  Lab 04/01/20 0413 04/03/20 0426 04/04/20 0427 04/05/20 0537 04/05/20 1314  WBC 13.5* 13.1* 15.7* 17.1* 20.5*  NEUTROABS  --  10.3* 12.3*  --  16.9*  HGB 13.8 12.6* 12.2* 11.4* 12.0*  HCT 41.4 38.8* 37.2* 35.5* 37.6*  MCV 96.5  98.0 97.4 98.3 99.5  PLT 234 220 221 189 187   Cardiac Enzymes: No results for input(s): CKTOTAL, CKMB, CKMBINDEX, TROPONINI in the last 168 hours. BNP: Invalid input(s): POCBNP CBG: Recent Labs  Lab 04/04/20 2048 04/05/20 0045 04/05/20 0433 04/05/20 0740 04/05/20 1140  GLUCAP 199* 213* 181* 184* 192*   D-Dimer No results for input(s): DDIMER in the last 72 hours. Hgb A1c No results for input(s): HGBA1C in the last 72 hours. Lipid Profile No results for input(s): CHOL, HDL, LDLCALC, TRIG, CHOLHDL, LDLDIRECT in the last 72 hours. Thyroid function studies No results for input(s): TSH, T4TOTAL, T3FREE, THYROIDAB in the last 72 hours.  Invalid input(s): FREET3 Anemia work up No results for input(s): VITAMINB12, FOLATE, FERRITIN, TIBC, IRON, RETICCTPCT in the last 72 hours. Urinalysis    Component Value Date/Time   COLORURINE YELLOW (A) 04/03/2020 1617   APPEARANCEUR TURBID (A) 04/03/2020 1617   APPEARANCEUR Cloudy (A) 06/01/2019 1533   LABSPEC 1.014 04/03/2020 1617   PHURINE 8.0 04/03/2020 1617   GLUCOSEU NEGATIVE 04/03/2020 1617   HGBUR MODERATE (A) 04/03/2020 1617   BILIRUBINUR NEGATIVE 04/03/2020 1617   BILIRUBINUR Negative 06/01/2019 1533   KETONESUR 5 (A) 04/03/2020 1617   PROTEINUR >=300 (A) 04/03/2020 1617   NITRITE NEGATIVE 04/03/2020 1617   LEUKOCYTESUR LARGE (A) 04/03/2020 1617   Sepsis Labs Invalid input(s): PROCALCITONIN,  WBC,  LACTICIDVEN Microbiology Recent Results (from the past 240 hour(s))  Respiratory Panel by RT PCR (Flu A&B, Covid) -  Nasopharyngeal Swab     Status: None   Collection Time: 03/28/2020 12:52 AM   Specimen: Nasopharyngeal Swab  Result Value Ref Range Status   SARS Coronavirus 2 by RT PCR NEGATIVE NEGATIVE Final    Comment: (NOTE) SARS-CoV-2 target nucleic acids are NOT DETECTED. The SARS-CoV-2 RNA is generally detectable in upper respiratoy specimens during the acute phase of infection. The lowest concentration of SARS-CoV-2 viral copies this assay can detect is 131 copies/mL. A negative result does not preclude SARS-Cov-2 infection and should not be used as the sole basis for treatment or other patient management decisions. A negative result may occur with  improper specimen collection/handling, submission of specimen other than nasopharyngeal swab, presence of viral mutation(s) within the areas targeted by this assay, and inadequate number of viral copies (<131 copies/mL). A negative result must be combined with clinical observations, patient history, and epidemiological information. The expected result is Negative. Fact Sheet for Patients:  PinkCheek.be Fact Sheet for Healthcare Providers:  GravelBags.it This test is not yet ap proved or cleared by the [place] FDA and  has been authorized for detection and/or diagnosis of SARS-CoV-2 by FDA under an Emergency Use Authorization (EUA). This EUA will remain  in effect (meaning this test can be used) for the duration of the COVID-19 declaration under Section 564(b)(1) of the Act, 21 U.S.C. section 360bbb-3(b)(1), unless the authorization is terminated or revoked sooner.    Influenza A by PCR NEGATIVE NEGATIVE Final   Influenza B by PCR NEGATIVE NEGATIVE Final    Comment: (NOTE) The Xpert Xpress SARS-CoV-2/FLU/RSV assay is intended as an aid in  the diagnosis of influenza from Nasopharyngeal swab specimens and  should not be used as a sole basis for treatment. Nasal washings and  aspirates  are unacceptable for Xpert Xpress SARS-CoV-2/FLU/RSV  testing. Fact Sheet for Patients: PinkCheek.be Fact Sheet for Healthcare Providers: GravelBags.it This test is not yet approved or cleared by the [place] FDA and  has been authorized for detection and/or  diagnosis of SARS-CoV-2 by  FDA under an Emergency Use Authorization (EUA). This EUA will remain  in effect (meaning this test can be used) for the duration of the  Covid-19 declaration under Section 564(b)(1) of the Act, 21  U.S.C. section 360bbb-3(b)(1), unless the authorization is  terminated or revoked. Performed at [facility], [place], [place] [address]   C Difficile Quick Screen w PCR reflex     Status: None   Collection Time: 03/31/20 10:55 AM   Specimen: STOOL  Result Value Ref Range Status   C Diff antigen NEGATIVE NEGATIVE Final   C Diff toxin NEGATIVE NEGATIVE Final   C Diff interpretation No C. difficile detected.  Final    Comment: Performed at [facility], [address] [place], [place] [address]  Urine Culture     Status: Abnormal   Collection Time: 04/03/20  4:17 PM   Specimen: Urine, Random  Result Value Ref Range Status   Specimen Description   Final    URINE, RANDOM Performed at [facility], [address] [place], [place] [address]    Special Requests   Final    NONE Performed at [facility], [address] [place], [place] [address]    Culture >=100,000 COLONIES/mL ESCHERICHIA COLI (A)  Final   Report Status 04/06/2020 FINAL  Final   Organism ID, Bacteria ESCHERICHIA COLI (A)  Final      Susceptibility   Escherichia coli - MIC*    AMPICILLIN <=2 SENSITIVE Sensitive     CEFAZOLIN <=4 SENSITIVE Sensitive     CEFTRIAXONE <=0.25 SENSITIVE Sensitive     CIPROFLOXACIN <=0.25 SENSITIVE Sensitive     GENTAMICIN <=1 SENSITIVE Sensitive     IMIPENEM <=0.25 SENSITIVE Sensitive      NITROFURANTOIN <=16 SENSITIVE Sensitive     TRIMETH/SULFA <=20 SENSITIVE Sensitive     AMPICILLIN/SULBACTAM <=2 SENSITIVE Sensitive     PIP/TAZO <=4 SENSITIVE Sensitive     * >=100,000 COLONIES/mL ESCHERICHIA COLI     Time coordinating discharge: Over 40 minutes  SIGNED:   Charlynne Cousins, MD  [facility] 05/07/2020, 11:47 AM Pager   If 7PM-7AM, please contact night-coverage [email or web address] Password [email or web address]

## 2020-04-15 DEATH — deceased

## 2020-06-28 ENCOUNTER — Ambulatory Visit: Payer: Self-pay | Admitting: Urology

## 2021-04-17 IMAGING — MR MR HEAD W/O CM
9 of 10 series · 43 of 48 positions shown · non-contrast
Comparison: Brain MRI 12/31/2018

CLINICAL DATA: Altered mental status

EXAM:
MRI HEAD WITHOUT CONTRAST
TECHNIQUE: Multiplanar, multiecho pulse sequences of the brain and surrounding
structures were obtained without intravenous contrast.

[Series 2: ax dwi_tracew · axial · 3.0mm · 0.83mm/px · z∈[-74,+78]mm · 8 of 52 slices shown]
[im 1/52]
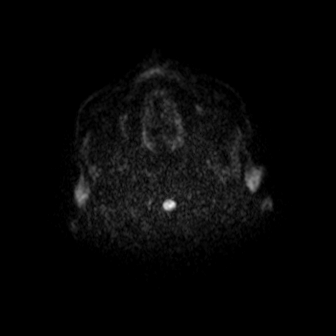
[im 8/52]
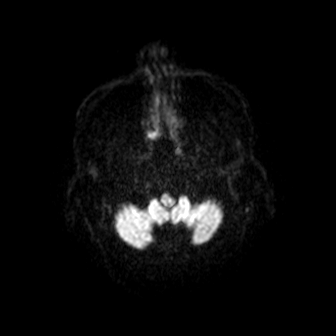
[im 15/52]
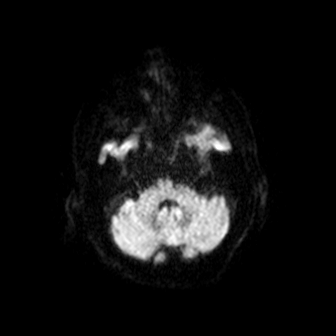
[im 22/52]
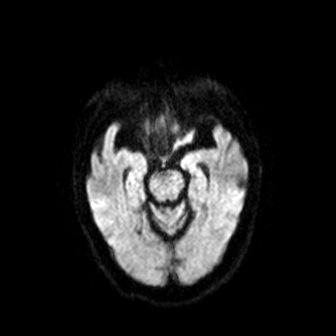
[im 30/52]
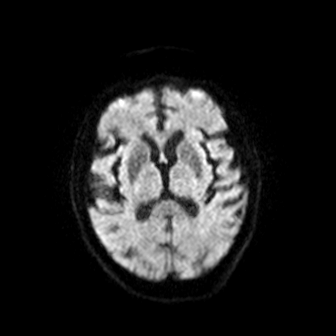
[im 37/52]
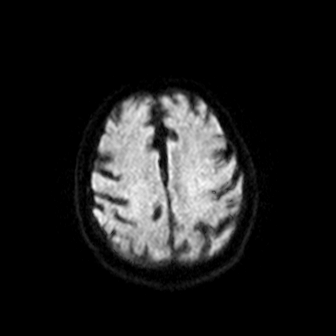
[im 44/52]
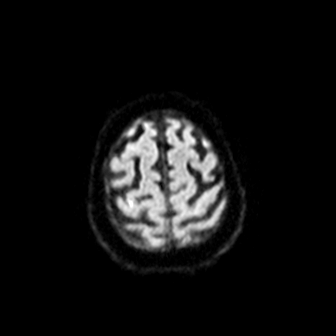
[im 52/52]
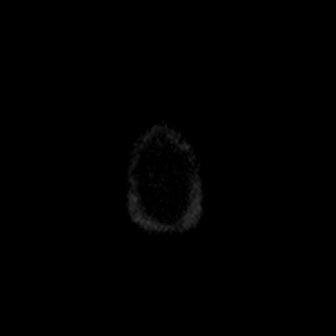

[Series 3: ax dwi_adc · axial · 3.0mm · 0.83mm/px · z∈[-74,+78]mm · 7 of 52 slices shown]
[im 1/52]
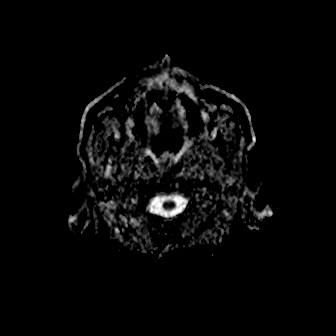
[im 9/52]
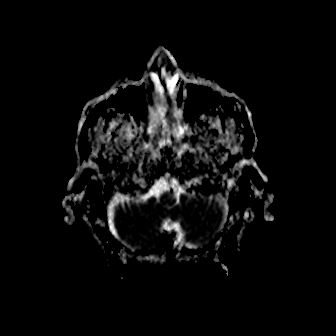
[im 18/52]
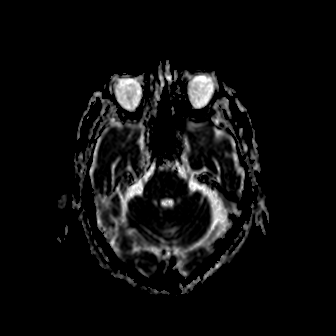
[im 26/52]
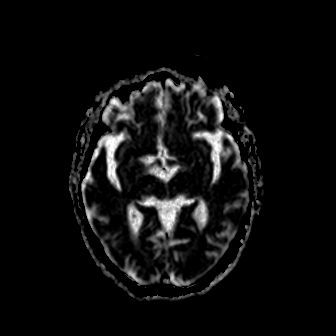
[im 35/52]
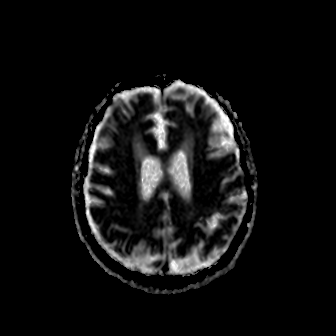
[im 43/52]
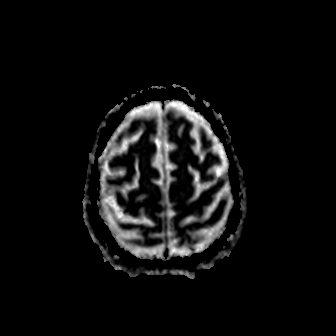
[im 52/52]
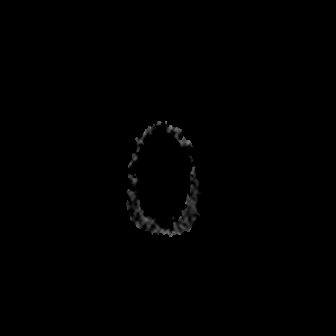

[Series 4: cor dwi_tracew · coronal · 5.0mm · 0.68mm/px · 5 of 38 slices shown]
[im 1/38]
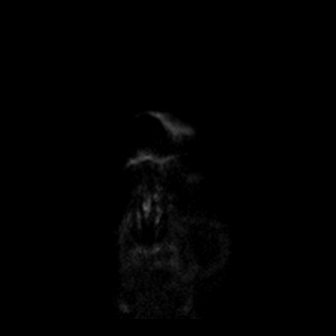
[im 10/38]
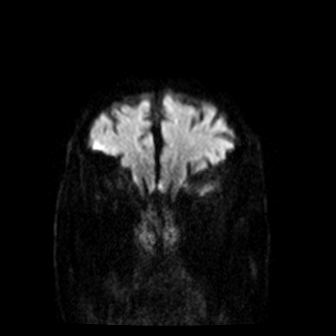
[im 19/38]
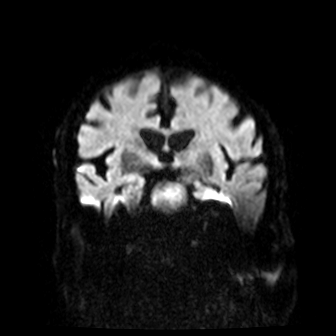
[im 28/38]
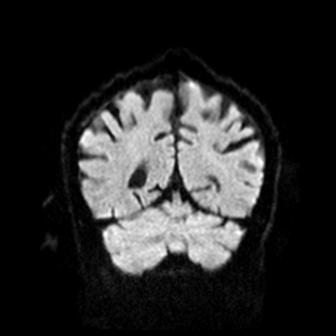
[im 38/38]
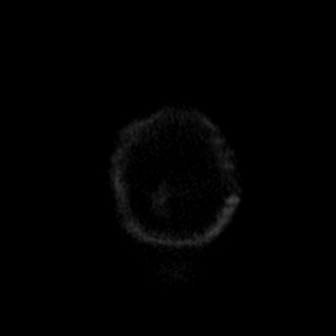

[Series 5: cor dwi_adc · coronal · 5.0mm · 0.68mm/px · 4 of 37 slices shown]
[im 1/37]
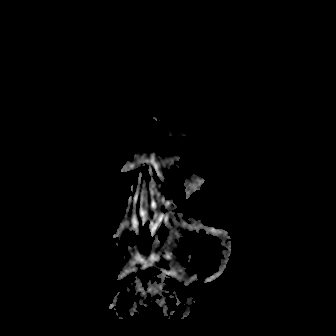
[im 10/37]
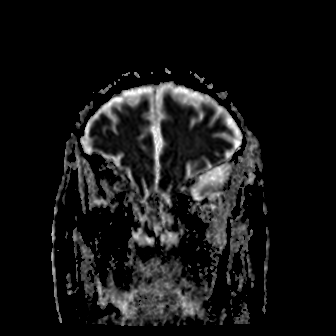
[im 19/37]
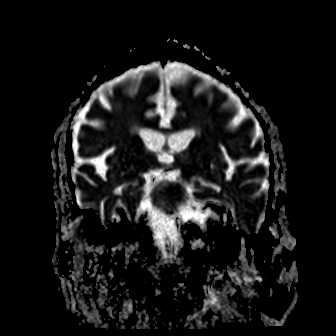
[im 28/37]
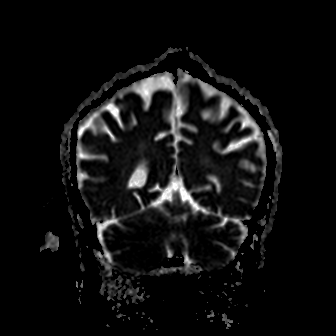

[Series 6: T1 · sagittal · 5.0mm · 0.94mm/px · 3 of 23 slices shown (1 of 2)]
[im 1/23]
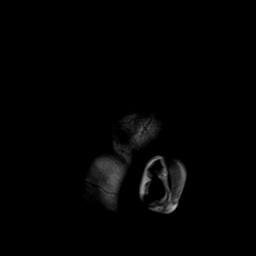
[im 12/23]
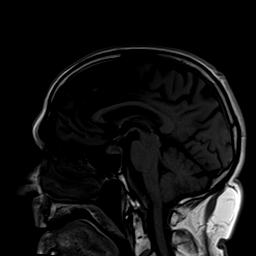
[im 23/23]
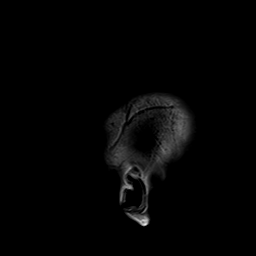

[Series 7: T2 · axial · 5.0mm · 0.45mm/px · z∈[-77,+79]mm · 4 of 27 slices shown (1 of 2)]
[im 1/27]
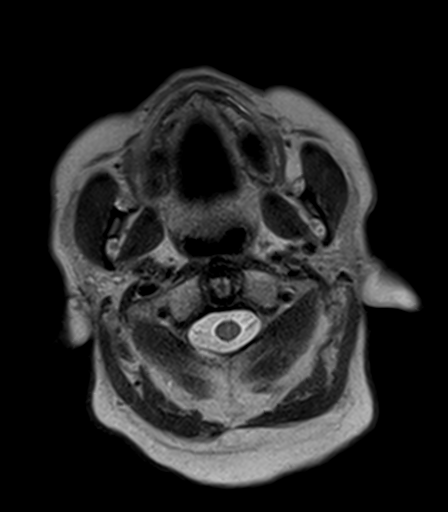
[im 9/27]
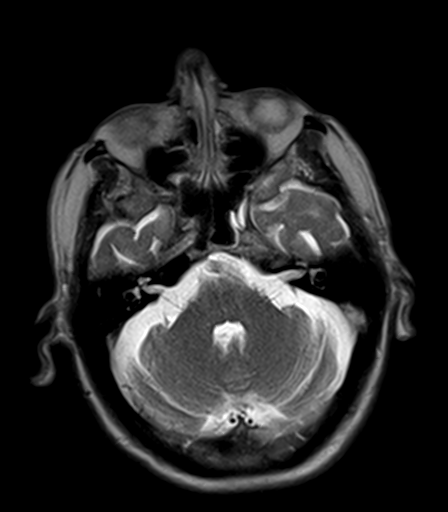
[im 18/27]
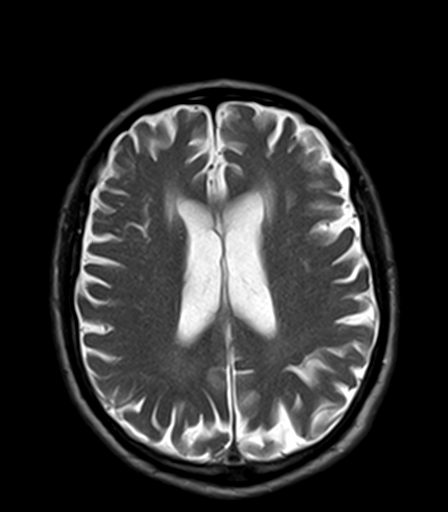
[im 27/27]
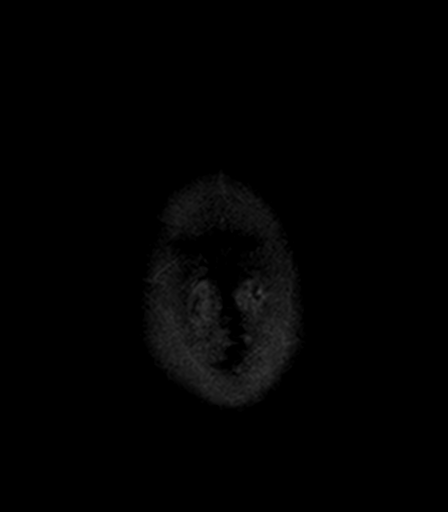

[Series 9: FLAIR · axial · 5.0mm · 1.20mm/px · z∈[-77,+78]mm · 4 of 27 slices shown]
[im 1/27]
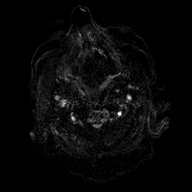
[im 9/27]
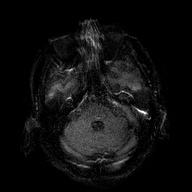
[im 18/27]
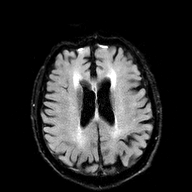
[im 27/27]
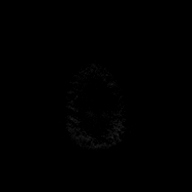

[Series 10: T1 · axial · 5.0mm · 0.90mm/px · z∈[-77,+79]mm · 4 of 27 slices shown (2 of 2)]
[im 1/27]
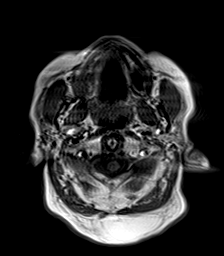
[im 9/27]
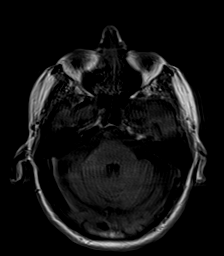
[im 18/27]
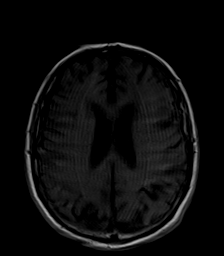
[im 27/27]
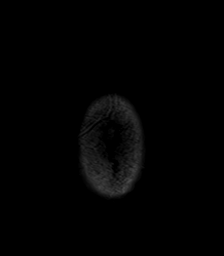

[Series 12: T2 · coronal · 5.0mm · 0.45mm/px · 4 of 31 slices shown (2 of 2)]
[im 1/31]
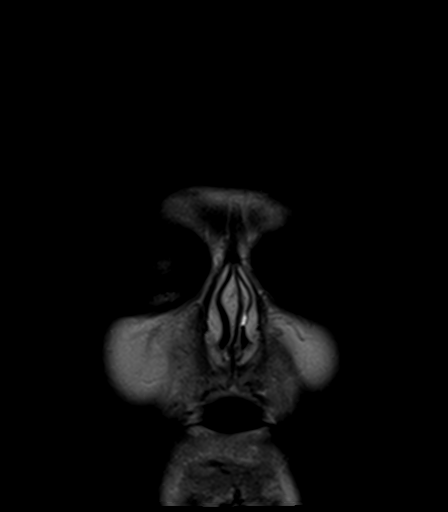
[im 11/31]
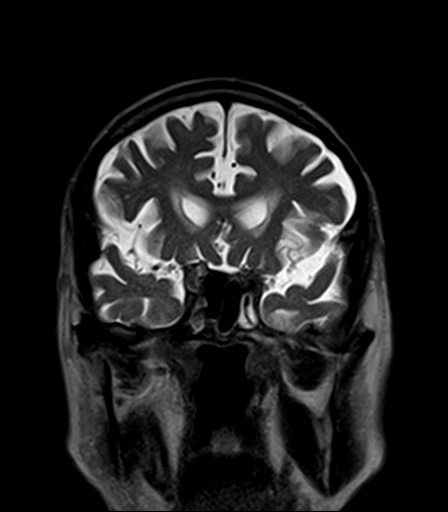
[im 21/31]
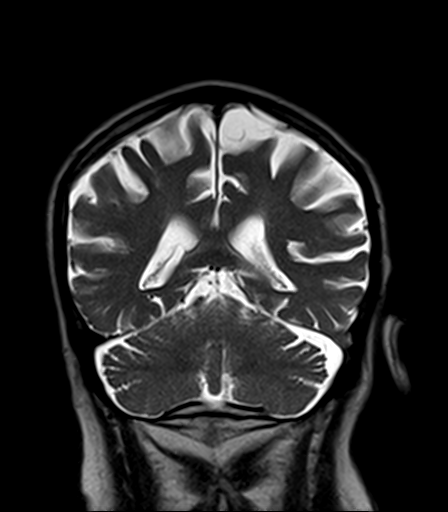
[im 31/31]
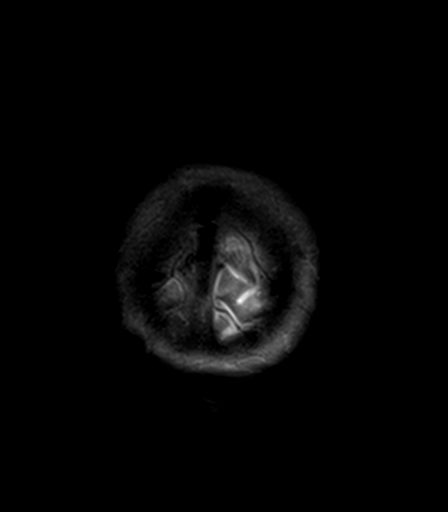

[43 of 48 positions shown; findings below may reference images not displayed]

FINDINGS: Brain: There is a small focus abnormal diffusion restriction within
the right precentral gyrus (series 2, image 43). No acute
hemorrhage. Early confluent hyperintense T2-weighted signal of the
periventricular and deep white matter, most commonly due to chronic
ischemic microangiopathy. There is generalized atrophy without lobar
predilection. Blood-sensitive sequences show no chronic
microhemorrhage or superficial siderosis. The midline structures are
normal.

Vascular: Normal flow voids.

Skull and upper cervical spine: Normal marrow signal.

Sinuses/Orbits: Negative.

Other: None.
IMPRESSION: 1. Small focus of acute ischemia within the right precentral gyrus.
No hemorrhage or mass effect.
2. Generalized atrophy and chronic ischemic microangiopathy.
